# Patient Record
Sex: Female | Born: 1935
Health system: Southern US, Community
[De-identification: ages and names within clinical notes are randomized; demographics above are authoritative.]

## PROBLEM LIST (undated history)

## (undated) DIAGNOSIS — K449 Diaphragmatic hernia without obstruction or gangrene: Secondary | ICD-10-CM

## (undated) DIAGNOSIS — R159 Full incontinence of feces: Secondary | ICD-10-CM

## (undated) DIAGNOSIS — K219 Gastro-esophageal reflux disease without esophagitis: Secondary | ICD-10-CM

## (undated) DIAGNOSIS — K3184 Gastroparesis: Secondary | ICD-10-CM

## (undated) DIAGNOSIS — D739 Disease of spleen, unspecified: Secondary | ICD-10-CM

## (undated) DIAGNOSIS — K635 Polyp of colon: Secondary | ICD-10-CM

## (undated) DIAGNOSIS — A159 Respiratory tuberculosis unspecified: Secondary | ICD-10-CM

## (undated) DIAGNOSIS — Z973 Presence of spectacles and contact lenses: Secondary | ICD-10-CM

## (undated) DIAGNOSIS — I10 Essential (primary) hypertension: Secondary | ICD-10-CM

## (undated) DIAGNOSIS — K589 Irritable bowel syndrome without diarrhea: Secondary | ICD-10-CM

## (undated) DIAGNOSIS — M797 Fibromyalgia: Secondary | ICD-10-CM

## (undated) DIAGNOSIS — H9190 Unspecified hearing loss, unspecified ear: Secondary | ICD-10-CM

## (undated) DIAGNOSIS — K5790 Diverticulosis of intestine, part unspecified, without perforation or abscess without bleeding: Secondary | ICD-10-CM

## (undated) DIAGNOSIS — Z972 Presence of dental prosthetic device (complete) (partial): Secondary | ICD-10-CM

## (undated) DIAGNOSIS — K469 Unspecified abdominal hernia without obstruction or gangrene: Secondary | ICD-10-CM

## (undated) DIAGNOSIS — Z974 Presence of external hearing-aid: Secondary | ICD-10-CM

## (undated) DIAGNOSIS — D7389 Other diseases of spleen: Secondary | ICD-10-CM

## (undated) DIAGNOSIS — Z9889 Other specified postprocedural states: Secondary | ICD-10-CM

## (undated) DIAGNOSIS — K2970 Gastritis, unspecified, without bleeding: Secondary | ICD-10-CM

## (undated) HISTORY — DX: Polyp of colon: K63.5

## (undated) HISTORY — DX: Gastroparesis: K31.84

## (undated) HISTORY — DX: Irritable bowel syndrome, unspecified: K58.9

## (undated) HISTORY — DX: Other specified postprocedural states: Z98.890

## (undated) HISTORY — DX: Disease of spleen, unspecified: D73.9

## (undated) HISTORY — DX: Respiratory tuberculosis unspecified: A15.9

## (undated) HISTORY — DX: Essential (primary) hypertension: I10

## (undated) HISTORY — PX: COLONOSCOPY W/ POLYPECTOMY: SHX1380

## (undated) HISTORY — DX: Diverticulosis of intestine, part unspecified, without perforation or abscess without bleeding: K57.90

## (undated) HISTORY — DX: Unspecified hearing loss, unspecified ear: H91.90

## (undated) HISTORY — DX: Diaphragmatic hernia without obstruction or gangrene: K44.9

## (undated) HISTORY — DX: Unspecified abdominal hernia without obstruction or gangrene: K46.9

## (undated) HISTORY — DX: Gastritis, unspecified, without bleeding: K29.70

## (undated) HISTORY — DX: Other diseases of spleen: D73.89

## (undated) HISTORY — DX: Fibromyalgia: M79.7

## (undated) HISTORY — DX: Gastro-esophageal reflux disease without esophagitis: K21.9

## (undated) HISTORY — PX: X-STOP IMPLANTATION: SHX2677

## (undated) HISTORY — PX: OTHER SURGICAL HISTORY: SHX169

---

## 2004-01-25 ENCOUNTER — Ambulatory Visit (HOSPITAL_COMMUNITY): Admission: RE | Admit: 2004-01-25 | Discharge: 2004-01-25 | Payer: Self-pay | Admitting: Family Medicine

## 2004-07-19 ENCOUNTER — Ambulatory Visit (HOSPITAL_COMMUNITY): Admission: RE | Admit: 2004-07-19 | Discharge: 2004-07-19 | Payer: Self-pay | Admitting: Family Medicine

## 2004-08-28 ENCOUNTER — Ambulatory Visit (HOSPITAL_COMMUNITY): Admission: RE | Admit: 2004-08-28 | Discharge: 2004-08-28 | Payer: Self-pay | Admitting: Ophthalmology

## 2004-09-03 ENCOUNTER — Ambulatory Visit (HOSPITAL_COMMUNITY): Admission: RE | Admit: 2004-09-03 | Discharge: 2004-09-03 | Payer: Self-pay | Admitting: Family Medicine

## 2004-09-11 ENCOUNTER — Ambulatory Visit (HOSPITAL_COMMUNITY): Admission: RE | Admit: 2004-09-11 | Discharge: 2004-09-11 | Payer: Self-pay | Admitting: Ophthalmology

## 2004-10-07 HISTORY — PX: UPPER GASTROINTESTINAL ENDOSCOPY: SHX188

## 2005-01-19 ENCOUNTER — Emergency Department (HOSPITAL_COMMUNITY): Admission: EM | Admit: 2005-01-19 | Discharge: 2005-01-19 | Payer: Self-pay | Admitting: *Deleted

## 2005-06-11 ENCOUNTER — Ambulatory Visit (HOSPITAL_COMMUNITY): Admission: RE | Admit: 2005-06-11 | Discharge: 2005-06-11 | Payer: Self-pay | Admitting: Family Medicine

## 2005-08-12 ENCOUNTER — Ambulatory Visit: Payer: Self-pay | Admitting: Internal Medicine

## 2005-08-22 ENCOUNTER — Ambulatory Visit (HOSPITAL_COMMUNITY): Admission: RE | Admit: 2005-08-22 | Discharge: 2005-08-22 | Payer: Self-pay | Admitting: Family Medicine

## 2005-09-03 ENCOUNTER — Ambulatory Visit (HOSPITAL_COMMUNITY): Admission: RE | Admit: 2005-09-03 | Discharge: 2005-09-03 | Payer: Self-pay | Admitting: Internal Medicine

## 2005-09-03 ENCOUNTER — Ambulatory Visit: Payer: Self-pay | Admitting: Internal Medicine

## 2005-09-03 HISTORY — PX: ESOPHAGOGASTRODUODENOSCOPY: SHX1529

## 2005-10-14 ENCOUNTER — Ambulatory Visit: Payer: Self-pay | Admitting: Internal Medicine

## 2006-06-05 ENCOUNTER — Ambulatory Visit: Payer: Self-pay | Admitting: Internal Medicine

## 2006-07-08 ENCOUNTER — Ambulatory Visit: Payer: Self-pay | Admitting: Gastroenterology

## 2006-07-10 ENCOUNTER — Ambulatory Visit (HOSPITAL_COMMUNITY): Admission: RE | Admit: 2006-07-10 | Discharge: 2006-07-10 | Payer: Self-pay | Admitting: Internal Medicine

## 2006-07-17 ENCOUNTER — Ambulatory Visit (HOSPITAL_COMMUNITY): Admission: RE | Admit: 2006-07-17 | Discharge: 2006-07-17 | Payer: Self-pay | Admitting: Internal Medicine

## 2006-08-07 ENCOUNTER — Ambulatory Visit: Payer: Self-pay | Admitting: Internal Medicine

## 2006-08-11 ENCOUNTER — Ambulatory Visit: Payer: Self-pay | Admitting: Internal Medicine

## 2006-08-11 ENCOUNTER — Ambulatory Visit (HOSPITAL_COMMUNITY): Admission: RE | Admit: 2006-08-11 | Discharge: 2006-08-11 | Payer: Self-pay | Admitting: Internal Medicine

## 2006-08-11 HISTORY — PX: ESOPHAGOGASTRODUODENOSCOPY: SHX1529

## 2006-09-11 ENCOUNTER — Ambulatory Visit: Payer: Self-pay | Admitting: Internal Medicine

## 2006-12-11 ENCOUNTER — Ambulatory Visit (HOSPITAL_COMMUNITY): Admission: RE | Admit: 2006-12-11 | Discharge: 2006-12-11 | Payer: Self-pay | Admitting: Family Medicine

## 2007-03-09 ENCOUNTER — Emergency Department (HOSPITAL_COMMUNITY): Admission: EM | Admit: 2007-03-09 | Discharge: 2007-03-09 | Payer: Self-pay | Admitting: Emergency Medicine

## 2007-05-11 ENCOUNTER — Ambulatory Visit (HOSPITAL_COMMUNITY): Admission: RE | Admit: 2007-05-11 | Discharge: 2007-05-11 | Payer: Self-pay | Admitting: Gastroenterology

## 2007-05-21 ENCOUNTER — Ambulatory Visit: Payer: Self-pay | Admitting: Gastroenterology

## 2007-07-07 ENCOUNTER — Ambulatory Visit: Payer: Self-pay | Admitting: Gastroenterology

## 2007-07-21 ENCOUNTER — Ambulatory Visit (HOSPITAL_COMMUNITY): Payer: Self-pay | Admitting: Oncology

## 2007-07-28 ENCOUNTER — Ambulatory Visit (HOSPITAL_COMMUNITY): Admission: RE | Admit: 2007-07-28 | Discharge: 2007-07-28 | Payer: Self-pay | Admitting: Oncology

## 2007-08-28 ENCOUNTER — Ambulatory Visit: Payer: Self-pay | Admitting: Gastroenterology

## 2007-08-31 ENCOUNTER — Ambulatory Visit (HOSPITAL_COMMUNITY): Admission: RE | Admit: 2007-08-31 | Discharge: 2007-08-31 | Payer: Self-pay | Admitting: Gastroenterology

## 2007-10-08 DIAGNOSIS — K3184 Gastroparesis: Secondary | ICD-10-CM

## 2007-10-08 HISTORY — DX: Gastroparesis: K31.84

## 2007-10-23 ENCOUNTER — Ambulatory Visit: Payer: Self-pay | Admitting: Gastroenterology

## 2007-11-13 ENCOUNTER — Ambulatory Visit (HOSPITAL_COMMUNITY): Admission: RE | Admit: 2007-11-13 | Discharge: 2007-11-13 | Payer: Self-pay | Admitting: Otolaryngology

## 2008-01-04 ENCOUNTER — Encounter (HOSPITAL_COMMUNITY): Admission: RE | Admit: 2008-01-04 | Discharge: 2008-02-03 | Payer: Self-pay | Admitting: Family Medicine

## 2008-01-05 ENCOUNTER — Ambulatory Visit (HOSPITAL_COMMUNITY): Payer: Self-pay | Admitting: Oncology

## 2008-03-15 ENCOUNTER — Ambulatory Visit (HOSPITAL_COMMUNITY): Admission: RE | Admit: 2008-03-15 | Discharge: 2008-03-15 | Payer: Self-pay | Admitting: Obstetrics & Gynecology

## 2008-05-02 ENCOUNTER — Other Ambulatory Visit: Admission: RE | Admit: 2008-05-02 | Discharge: 2008-05-02 | Payer: Self-pay | Admitting: Obstetrics & Gynecology

## 2008-05-18 ENCOUNTER — Ambulatory Visit: Payer: Self-pay | Admitting: Gastroenterology

## 2008-05-25 ENCOUNTER — Ambulatory Visit (HOSPITAL_COMMUNITY): Admission: RE | Admit: 2008-05-25 | Discharge: 2008-05-25 | Payer: Self-pay | Admitting: Gastroenterology

## 2008-05-25 ENCOUNTER — Ambulatory Visit: Payer: Self-pay | Admitting: Gastroenterology

## 2008-05-25 HISTORY — PX: ESOPHAGOGASTRODUODENOSCOPY: SHX1529

## 2008-05-27 ENCOUNTER — Ambulatory Visit: Payer: Self-pay | Admitting: Gastroenterology

## 2008-05-27 HISTORY — PX: GIVENS CAPSULE STUDY: SHX5432

## 2008-06-23 DIAGNOSIS — F341 Dysthymic disorder: Secondary | ICD-10-CM | POA: Insufficient documentation

## 2008-06-23 DIAGNOSIS — K2990 Gastroduodenitis, unspecified, without bleeding: Secondary | ICD-10-CM

## 2008-06-23 DIAGNOSIS — R159 Full incontinence of feces: Secondary | ICD-10-CM | POA: Insufficient documentation

## 2008-06-23 DIAGNOSIS — J301 Allergic rhinitis due to pollen: Secondary | ICD-10-CM | POA: Insufficient documentation

## 2008-06-23 DIAGNOSIS — R112 Nausea with vomiting, unspecified: Secondary | ICD-10-CM | POA: Insufficient documentation

## 2008-06-23 DIAGNOSIS — R141 Gas pain: Secondary | ICD-10-CM | POA: Insufficient documentation

## 2008-06-23 DIAGNOSIS — K449 Diaphragmatic hernia without obstruction or gangrene: Secondary | ICD-10-CM

## 2008-06-23 DIAGNOSIS — D739 Disease of spleen, unspecified: Secondary | ICD-10-CM | POA: Insufficient documentation

## 2008-06-23 DIAGNOSIS — Z8711 Personal history of peptic ulcer disease: Secondary | ICD-10-CM | POA: Insufficient documentation

## 2008-06-23 DIAGNOSIS — D126 Benign neoplasm of colon, unspecified: Secondary | ICD-10-CM | POA: Insufficient documentation

## 2008-06-23 DIAGNOSIS — R143 Flatulence: Secondary | ICD-10-CM

## 2008-06-23 DIAGNOSIS — H9190 Unspecified hearing loss, unspecified ear: Secondary | ICD-10-CM | POA: Insufficient documentation

## 2008-06-23 DIAGNOSIS — K582 Mixed irritable bowel syndrome: Secondary | ICD-10-CM | POA: Insufficient documentation

## 2008-06-23 DIAGNOSIS — K2970 Gastritis, unspecified, without bleeding: Secondary | ICD-10-CM | POA: Insufficient documentation

## 2008-06-23 DIAGNOSIS — R142 Eructation: Secondary | ICD-10-CM

## 2008-06-23 DIAGNOSIS — R1319 Other dysphagia: Secondary | ICD-10-CM | POA: Insufficient documentation

## 2008-06-23 DIAGNOSIS — Z87891 Personal history of nicotine dependence: Secondary | ICD-10-CM | POA: Insufficient documentation

## 2008-06-23 DIAGNOSIS — IMO0002 Reserved for concepts with insufficient information to code with codable children: Secondary | ICD-10-CM | POA: Insufficient documentation

## 2008-06-23 DIAGNOSIS — R195 Other fecal abnormalities: Secondary | ICD-10-CM | POA: Insufficient documentation

## 2008-06-23 DIAGNOSIS — K219 Gastro-esophageal reflux disease without esophagitis: Secondary | ICD-10-CM | POA: Insufficient documentation

## 2008-06-23 DIAGNOSIS — K5730 Diverticulosis of large intestine without perforation or abscess without bleeding: Secondary | ICD-10-CM | POA: Insufficient documentation

## 2008-06-23 DIAGNOSIS — R109 Unspecified abdominal pain: Secondary | ICD-10-CM | POA: Insufficient documentation

## 2008-06-23 DIAGNOSIS — I10 Essential (primary) hypertension: Secondary | ICD-10-CM | POA: Insufficient documentation

## 2008-06-23 DIAGNOSIS — K589 Irritable bowel syndrome without diarrhea: Secondary | ICD-10-CM

## 2008-06-23 DIAGNOSIS — K5909 Other constipation: Secondary | ICD-10-CM | POA: Insufficient documentation

## 2008-06-23 DIAGNOSIS — R197 Diarrhea, unspecified: Secondary | ICD-10-CM | POA: Insufficient documentation

## 2008-06-23 DIAGNOSIS — M479 Spondylosis, unspecified: Secondary | ICD-10-CM | POA: Insufficient documentation

## 2008-06-23 DIAGNOSIS — K2980 Duodenitis without bleeding: Secondary | ICD-10-CM | POA: Insufficient documentation

## 2008-06-23 DIAGNOSIS — L259 Unspecified contact dermatitis, unspecified cause: Secondary | ICD-10-CM | POA: Insufficient documentation

## 2008-06-23 HISTORY — DX: Diaphragmatic hernia without obstruction or gangrene: K44.9

## 2008-08-02 ENCOUNTER — Ambulatory Visit: Payer: Self-pay | Admitting: Gastroenterology

## 2008-08-04 ENCOUNTER — Ambulatory Visit (HOSPITAL_COMMUNITY): Payer: Self-pay | Admitting: Oncology

## 2008-08-11 ENCOUNTER — Encounter (HOSPITAL_COMMUNITY): Admission: RE | Admit: 2008-08-11 | Discharge: 2008-09-10 | Payer: Self-pay | Admitting: Oncology

## 2008-08-15 ENCOUNTER — Encounter (HOSPITAL_COMMUNITY): Admission: RE | Admit: 2008-08-15 | Discharge: 2008-09-14 | Payer: Self-pay | Admitting: Gastroenterology

## 2008-08-30 ENCOUNTER — Ambulatory Visit: Payer: Self-pay | Admitting: Thoracic Surgery

## 2008-09-05 ENCOUNTER — Encounter: Payer: Self-pay | Admitting: Gastroenterology

## 2008-09-05 LAB — CONVERTED CEMR LAB
Hgb A1c MFr Bld: 6 % (ref 4.6–6.1)
TSH: 1.095 microintl units/mL (ref 0.350–4.50)

## 2008-09-20 ENCOUNTER — Ambulatory Visit: Payer: Self-pay | Admitting: Gastroenterology

## 2008-10-03 ENCOUNTER — Inpatient Hospital Stay (HOSPITAL_COMMUNITY): Admission: RE | Admit: 2008-10-03 | Discharge: 2008-10-08 | Payer: Self-pay | Admitting: Thoracic Surgery

## 2008-10-03 ENCOUNTER — Ambulatory Visit: Payer: Self-pay | Admitting: Thoracic Surgery

## 2008-10-03 ENCOUNTER — Encounter: Payer: Self-pay | Admitting: Thoracic Surgery

## 2008-10-12 ENCOUNTER — Encounter: Admission: RE | Admit: 2008-10-12 | Discharge: 2008-10-12 | Payer: Self-pay | Admitting: Thoracic Surgery

## 2008-10-12 ENCOUNTER — Ambulatory Visit: Payer: Self-pay | Admitting: Thoracic Surgery

## 2008-10-25 ENCOUNTER — Encounter: Admission: RE | Admit: 2008-10-25 | Discharge: 2008-10-25 | Payer: Self-pay | Admitting: Thoracic Surgery

## 2008-10-25 ENCOUNTER — Ambulatory Visit: Payer: Self-pay | Admitting: Thoracic Surgery

## 2008-11-30 ENCOUNTER — Ambulatory Visit: Payer: Self-pay | Admitting: Thoracic Surgery

## 2008-11-30 ENCOUNTER — Encounter: Admission: RE | Admit: 2008-11-30 | Discharge: 2008-11-30 | Payer: Self-pay | Admitting: Thoracic Surgery

## 2009-01-25 ENCOUNTER — Encounter: Admission: RE | Admit: 2009-01-25 | Discharge: 2009-01-25 | Payer: Self-pay | Admitting: Thoracic Surgery

## 2009-01-25 ENCOUNTER — Ambulatory Visit: Payer: Self-pay | Admitting: Thoracic Surgery

## 2009-02-27 ENCOUNTER — Encounter (HOSPITAL_COMMUNITY): Admission: RE | Admit: 2009-02-27 | Discharge: 2009-03-29 | Payer: Self-pay | Admitting: Oncology

## 2009-02-27 ENCOUNTER — Ambulatory Visit (HOSPITAL_COMMUNITY): Payer: Self-pay | Admitting: Oncology

## 2009-03-22 ENCOUNTER — Ambulatory Visit (HOSPITAL_COMMUNITY): Admission: RE | Admit: 2009-03-22 | Discharge: 2009-03-22 | Payer: Self-pay | Admitting: Family Medicine

## 2009-04-19 ENCOUNTER — Ambulatory Visit: Payer: Self-pay | Admitting: Gastroenterology

## 2009-04-20 ENCOUNTER — Encounter: Payer: Self-pay | Admitting: Gastroenterology

## 2009-06-01 ENCOUNTER — Other Ambulatory Visit: Admission: RE | Admit: 2009-06-01 | Discharge: 2009-06-01 | Payer: Self-pay | Admitting: Obstetrics & Gynecology

## 2009-11-03 ENCOUNTER — Ambulatory Visit: Payer: Self-pay | Admitting: Gastroenterology

## 2009-11-03 DIAGNOSIS — K3184 Gastroparesis: Secondary | ICD-10-CM | POA: Insufficient documentation

## 2010-01-24 ENCOUNTER — Ambulatory Visit (HOSPITAL_COMMUNITY): Admission: RE | Admit: 2010-01-24 | Discharge: 2010-01-24 | Payer: Self-pay | Admitting: Family Medicine

## 2010-03-27 ENCOUNTER — Ambulatory Visit (HOSPITAL_COMMUNITY): Payer: Self-pay | Admitting: Oncology

## 2010-03-27 ENCOUNTER — Encounter (HOSPITAL_COMMUNITY): Admission: RE | Admit: 2010-03-27 | Discharge: 2010-05-12 | Payer: Self-pay | Admitting: Oncology

## 2010-04-10 ENCOUNTER — Ambulatory Visit (HOSPITAL_COMMUNITY): Admission: RE | Admit: 2010-04-10 | Discharge: 2010-04-10 | Payer: Self-pay | Admitting: Family Medicine

## 2010-04-20 ENCOUNTER — Encounter (INDEPENDENT_AMBULATORY_CARE_PROVIDER_SITE_OTHER): Payer: Self-pay

## 2010-06-20 ENCOUNTER — Ambulatory Visit: Payer: Self-pay | Admitting: Gastroenterology

## 2010-06-26 ENCOUNTER — Observation Stay (HOSPITAL_COMMUNITY): Admission: EM | Admit: 2010-06-26 | Discharge: 2010-06-28 | Payer: Self-pay | Admitting: Emergency Medicine

## 2010-07-11 DIAGNOSIS — K625 Hemorrhage of anus and rectum: Secondary | ICD-10-CM | POA: Insufficient documentation

## 2010-07-27 ENCOUNTER — Encounter: Payer: Self-pay | Admitting: Gastroenterology

## 2010-10-28 ENCOUNTER — Encounter: Payer: Self-pay | Admitting: Thoracic Surgery

## 2010-10-28 ENCOUNTER — Encounter (HOSPITAL_COMMUNITY): Payer: Self-pay | Admitting: Oncology

## 2010-11-06 NOTE — Letter (Signed)
Summary: [facility] REFERRAL  [facility] REFERRAL   Imported By: Ave Filter 07/27/2010 11:35:08  _____________________________________________________________________  External Attachment:    Type:   Image     Comment:   External Document  Appended Document: [facility] REFERRAL Per Cameon at [facility] when she called the pt to get her scheduled for her procedure and pt stated she was no longer having any problems and doesn't want to get this done right now..She may have it done in March 2012 after she comes to f/u in our office first.  Appended Document: [facility] REFERRAL Pts daughter Avie Arenas called and stated that her mother lied and she is having problems. She really needs to get this tcs done but would like to have it done somewhere closer.Marland KitchenMarland KitchenPlease advise?  Please call her back at [phone](Pam)  Appended Document: [facility] REFERRAL Please call daughter. [facility] is the closest place to attempt her second colonoscopy.  Appended Document: [facility] REFERRAL I spoke with pts daughter gave her Dr Darrick Penna recommendations and she stated she would call back to [facility] and work on an appt for her mother.

## 2010-11-06 NOTE — Assessment & Plan Note (Signed)
Summary: fu ov 6 mo,gerd,ibs/ams   Chief Complaint:  6 month follow up- doing good.  History of Present Illness: 77 y/o caucasian female w/ hx GERD/IBS.  Has to wear depends.  3 "good" BMs daily, then some leakage.  Takes bentyl occasionally & this helps.  "lots of gas."  Denies nausea/vomiting.  denies fever/chills. Wt stable.  Feels like she weighs too much.  Passed mucous in stool 2 months ago, thought she saw reddish-brown blood on toilet paper.  GERD well-controlled on omeprazole two times a day. Abnl GES dx gastroparesis 08/2008.    Current Problems (verified): 1)  Gastroparesis  (ICD-536.3) 2)  Unspecified Disease of Spleen  (ICD-289.50) 3)  Hx of Duodenitis  (ICD-535.60) 4)  Hx of Gastritis  (ICD-535.50) 5)  Allergic Rhinitis, Seasonal  (ICD-477.0) 6)  Depression/anxiety  (ICD-300.4) 7)  Osteoarthritis, Spine  (ICD-721.90) 8)  Degenerative Disc Disease  (ICD-722.6) 9)  Decreased Hearing  (ICD-389.9) 10)  Pud, Hx of  (ICD-V12.71) 11)  Hypertension  (ICD-401.9) 12)  Hiatal Hernia  (ICD-553.3) 13)  Fh of Lymphoma  (ICD-202.80) 14)  Other Dysphagia  (ICD-787.29) 15)  Gastroesophageal Reflux Disease, Chronic  (ICD-530.81) 16)  Nausea and Vomiting  (ICD-787.01) 17)  Hx of Diverticulosis of Colon  (ICD-562.10) 18)  Abdominal Pain, Lower  (ICD-789.09) 19)  Hx of Colonic Polyps, Adenomatous  (ICD-211.3) 20)  Hx of Fecal Incontinence  (ICD-787.6) 21)  Constipation, Chronic  (ICD-564.09) 22)  Eczema  (ICD-692.9) 23)  Diarrhea, Bloody  (ICD-787.91) 24)  Tobacco Use, Quit  (ICD-V15.82) 25)  Irritable Bowel Syndrome  (ICD-564.1) 26)  Abdominal Bloating  (ICD-787.3) 27)  Abnormal Feces  (ICD-787.7)  Current Medications (verified): 1)  Caduet 10-20 Mg Tabs (Amlodipine-Atorvastatin) .... Take 1 Tablet By Mouth Once A Day 2)  Klonopin 1 Mg Tabs (Clonazepam) .... Take 1 Tablet By Mouth Once A Day 3)  Visine 0.05 % Soln (Tetrahydrozoline Hcl) .... Drops To Each Eye At Bedtime As  Needed 4)  Omega-3 350 Mg Caps (Omega-3 Fatty Acids) .... Take 1 Capsule By Mouth Once A Day 5)  Tums 500 Mg Chew (Calcium Carbonate Antacid) .... As Needed 6)  Omeprazole 20 Mg Cpdr (Omeprazole) .... Two Times A Day 7)  Nabumentone 750 .... As Needed 8)  Fluoxetine Hcl 20 Mg Caps (Fluoxetine Hcl) .... Once Daily 9)  Multivitamins  Tabs (Multiple Vitamin) .... Once Daily 10)  Systane Ultra 0.4-0.3 % Soln (Polyethyl Glycol-Propyl Glycol) .... As Needed 11)  Bentyl 10 Mg Caps (Dicyclomine Hcl) .... One By Mouth As Needed Bid For Diarrhea  Allergies (verified): No Known Drug Allergies  Past History:  Past Medical History: 1. Gastroesophageal reflux disease controlled with 20 mg b.i.d. 2. Difficulty hearing. 3. Splenic hamartoma. 4. Malignant polyp in 1994 with incomplete colonoscopy in 2006. 5. Hypertension. 6. History of irregular heartbeat. 7. Bladder irritation. 8. Depression with anxious mood. 9. Irritable bowel syndrome. 10.Hiatal hernia. 11.Diverticulosis. 12.Gastritis. 13.Eczema. 14.EGD with dilation with a 56-French Elease Hashimoto in November 2006 for     dysphagia.   Vital Signs:  Patient profile:   77 year old female Height:      68 inches Weight:      146 pounds BMI:     22.28 Temp:     97.7 degrees F oral Pulse rate:   74 / minute BP sitting:   112 / 78  (left arm) Cuff size:   regular  Vitals Entered By: Hendricks Limes LPN (November 03, 2009 11:40 AM)  Physical Exam  General:  Well developed, well nourished, no acute distress. Head:  Normocephalic and atraumatic. Eyes:  Sclera clear, no icterus. Ears:  HOH Mouth:  No deformity or lesions, dentition normal. Neck:  Supple; no masses or thyromegaly. Heart:  Regular rate and rhythm; no murmurs, rubs,  or bruits. Abdomen:  Soft, nontender and nondistended. No masses, hepatosplenomegaly or hernias noted. Normal bowel sounds.without guarding and without rebound.   Msk:  Symmetrical with no gross deformities. Normal  posture. Pulses:  Normal pulses noted. Extremities:  No clubbing, cyanosis, edema or deformities noted. Neurologic:  Alert and  oriented x4;  grossly normal neurologically. Skin:  Intact without significant lesions or rashes. Cervical Nodes:  No significant cervical adenopathy. Psych:  Alert and cooperative. Normal mood and affect.  Impression & Recommendations:  Problem # 1:  GASTROESOPHAGEAL REFLUX DISEASE, CHRONIC (ICD-530.81)  Well-controlled on two times a day Omeprazole  Orders: Est. Patient Level III (46962)  Problem # 2:  Hx of FECAL INCONTINENCE (ICD-787.6) Stable  Problem # 3:  IRRITABLE BOWEL SYNDROME (ICD-564.1)  Stable, bentyl helps.  Some bloating.    Orders: Est. Patient Level III (95284)  Patient Instructions: 1)  Continue bentyl up to two times a day as needed 2)  Begin Sustanex probiotics daily 3)  Office visit 6 months Dr Darrick Penna 4)  Call sooner if any problems Prescriptions: BENTYL 10 MG CAPS (DICYCLOMINE HCL) one by mouth as needed BID for diarrhea  #180 x 3   Entered and Authorized by:   Joselyn Arrow FNP-BC   Signed by:   Joselyn Arrow FNP-BC on 11/03/2009   Method used:   Print then Give to Patient   RxID:   [id number]

## 2010-11-06 NOTE — Letter (Signed)
Summary: Recall Office Visit  [facility]  [address]   [place], [place] [address]   Phone: [phone]  Fax: [phone]      April 20, 2010   [facility] [address] [place], [place]  [address] [birth year]   Dear Ms. [facility],   According to our records, it is time for you to schedule a follow-up office visit with [place].   At your convenience, please call [phone] to schedule an office visit. If you have any questions, concerns, or feel that this letter is in error, we would appreciate your call.   Sincerely,    Hendricks Limes LPN  [facility] Ph: [phone]   Fax: [phone]  Appended Document: Recall Office Visit LAST VISIT WITH DR. NEIJSTROM JULY 2011, Splenic lesion stable.

## 2010-11-06 NOTE — Assessment & Plan Note (Signed)
Summary: IBS, GERD   Visit Type:  Follow-up Visit Primary Care Provider:  Janna Arch, M.D.  Chief Complaint:  F/U Gerd/ IBS.  History of Present Illness: Her bowels: not a lot of trouble. Ever once in a while 1x/q2 mos: soft but not diarrhea she does not have strength to push it out and it comes out a little bit at a time. No fiber supplement and likes corn but not peas. May pass mucous and it's brownish, "red". LAST TCS INCOMPLETE DUE TORTUOUS COLON. Bms: every day. Heartburn controlled-70% of the time. Flares triggered by something she has eaten and usu. sweets and spicy foods.  Getting steroid shots in her back. Plan to repeat q6-12 mos. Has trouble swallowing large pills. Needs to cut up meats. Food not getting hung up.  Current Medications (verified): 1)  Caduet 10-20 Mg Tabs (Amlodipine-Atorvastatin) .... Take 1 Tablet By Mouth Once A Day 2)  Klonopin 1 Mg Tabs (Clonazepam) .... Take 1 Tablet By Mouth Once A Day 3)  Visine 0.05 % Soln (Tetrahydrozoline Hcl) .... Drops To Each Eye At Bedtime As Needed 4)  Omega-3 350 Mg Caps (Omega-3 Fatty Acids) .... Take 1 Capsule By Mouth Once A Day 5)  Tums 500 Mg Chew (Calcium Carbonate Antacid) .... As Needed 6)  Multivitamins  Tabs (Multiple Vitamin) .... Once Daily 7)  Bentyl 10 Mg Caps (Dicyclomine Hcl) .... One By Mouth As Needed Bid For Diarrhea 8)  Effexor Xr 75 Mg Xr24h-Cap (Venlafaxine Hcl) .... Take 1 Tablet By Mouth Once A Day  Allergies (verified): No Known Drug Allergies  Past History:  Past Medical History: Last updated: 11/03/2009 1. Gastroesophageal reflux disease controlled with 20 mg b.i.d. 2. Difficulty hearing. 3. Splenic hamartoma. 4. Malignant polyp in 1994 with incomplete colonoscopy in 2006. 5. Hypertension. 6. History of irregular heartbeat. 7. Bladder irritation. 8. Depression with anxious mood. 9. Irritable bowel syndrome. 10.Hiatal hernia. 11.Diverticulosis. 12.Gastritis. 13.Eczema. 14.EGD with dilation  with a 56-French Elease Hashimoto in November 2006 for     dysphagia.   Vital Signs:  Patient profile:   77 year old female Height:      68 inches Weight:      140 pounds BMI:     21.36 Temp:     97.8 degrees F oral Pulse rate:   80 / minute BP sitting:   110 / 70  (left arm) Cuff size:   regular  Vitals Entered By: Hendricks Limes LPN (June 20, 2010 2:24 PM)  Impression & Recommendations:  Problem # 1:  IRRITABLE BOWEL SYNDROME (ICD-564.1) Assessment Improved Add Benefiber to bulk up stools. Continue Bentyl. OPV in 6 mos.   Problem # 2:  GASTROESOPHAGEAL REFLUX DISEASE, CHRONIC (ICD-530.81) Assessment: Unchanged Controlled fairly well.  CC: PCP  Patient Instructions: 1)  ADD BENEFIBER 2 TSP DAILY. 2)  CONTINUE BENTYL. 3)  FOLLOW UP IN 6 MOS. 4)  The medication list was reviewed and reconciled.  All changed / newly prescribed medications were explained.  A complete medication list was provided to the patient / caregiver.  Appended Document: IBS, GERD ADD RECTAL BLEEDING AS A DIAGNOSIS. NEED TCS AT [facility] W/ OVERTUBE AND FLUROSCOPY W/I NEXT 2-3 MOS. Pt has wedding and vacation planned in OCT 2011.  Appended Document: IBS, GERD Reminder placed in IDX to set pt up in 08/2010.  Appended Document: IBS, GERD 6 MONTH F/U OPV IS IN THE COMPUTER  Appended Document: Orders Update    Clinical Lists Changes  Problems: Added new problem of RECTAL BLEEDING (  ICD-569.3) Orders: Added new Service order of Est. Patient Level III (16109) - Signed

## 2010-12-20 LAB — DIFFERENTIAL
Basophils Relative: 1 % (ref 0–1)
Eosinophils Absolute: 0.1 10*3/uL (ref 0.0–0.7)
Eosinophils Relative: 1 % (ref 0–5)
Lymphs Abs: 1.9 10*3/uL (ref 0.7–4.0)
Monocytes Absolute: 0.3 10*3/uL (ref 0.1–1.0)
Monocytes Relative: 5 % (ref 3–12)
Neutrophils Relative %: 58 % (ref 43–77)

## 2010-12-20 LAB — CARDIAC PANEL(CRET KIN+CKTOT+MB+TROPI)
CK, MB: 0.5 ng/mL (ref 0.3–4.0)
CK, MB: 0.7 ng/mL (ref 0.3–4.0)
Relative Index: INVALID (ref 0.0–2.5)
Total CK: 27 U/L (ref 7–177)
Troponin I: <0.01 ng/mL (ref 0.00–0.06)
Troponin I: <0.01 ng/mL (ref 0.00–0.06)

## 2010-12-20 LAB — CBC
HCT: 36.8 % (ref 36.0–46.0)
Hemoglobin: 12.2 g/dL (ref 12.0–15.0)
MCH: 26.9 pg (ref 26.0–34.0)
MCHC: 33.1 g/dL (ref 30.0–36.0)
MCV: 81.1 fL (ref 78.0–100.0)
RBC: 4.53 MIL/uL (ref 3.87–5.11)

## 2010-12-20 LAB — BASIC METABOLIC PANEL
CO2: 26 mEq/L (ref 19–32)
Calcium: 9.2 mg/dL (ref 8.4–10.5)
Chloride: 108 mEq/L (ref 96–112)
Glucose, Bld: 125 mg/dL — ABNORMAL HIGH (ref 70–99)
Sodium: 140 mEq/L (ref 135–145)

## 2010-12-20 LAB — POCT CARDIAC MARKERS
Myoglobin, poc: 43.1 ng/mL (ref 12–200)
Troponin i, poc: <0.05 ng/mL (ref 0.00–0.09)

## 2010-12-23 LAB — COMPREHENSIVE METABOLIC PANEL
Albumin: 3.9 g/dL (ref 3.5–5.2)
Alkaline Phosphatase: 79 U/L (ref 39–117)
BUN: 10 mg/dL (ref 6–23)
Creatinine, Ser: 0.8 mg/dL (ref 0.4–1.2)
Glucose, Bld: 97 mg/dL (ref 70–99)
Potassium: 3.9 mEq/L (ref 3.5–5.1)
Total Bilirubin: 0.5 mg/dL (ref 0.3–1.2)
Total Protein: 6.4 g/dL (ref 6.0–8.3)

## 2010-12-23 LAB — CBC
HCT: 34.8 % — ABNORMAL LOW (ref 36.0–46.0)
Hemoglobin: 11.4 g/dL — ABNORMAL LOW (ref 12.0–15.0)
MCV: 78.6 fL (ref 78.0–100.0)
Platelets: 234 10*3/uL (ref 150–400)
RDW: 16.7 % — ABNORMAL HIGH (ref 11.5–15.5)

## 2011-01-15 LAB — BASIC METABOLIC PANEL
CO2: 30 mEq/L (ref 19–32)
Calcium: 9.1 mg/dL (ref 8.4–10.5)
GFR calc Af Amer: >60 mL/min (ref >60)
GFR calc non Af Amer: >60 mL/min (ref >60)
Potassium: 4.2 mEq/L (ref 3.5–5.1)
Sodium: 138 mEq/L (ref 135–145)

## 2011-01-15 LAB — CBC
HCT: 32.7 % — ABNORMAL LOW (ref 36.0–46.0)
Hemoglobin: 11 g/dL — ABNORMAL LOW (ref 12.0–15.0)
RBC: 4.3 MIL/uL (ref 3.87–5.11)

## 2011-01-31 ENCOUNTER — Ambulatory Visit (INDEPENDENT_AMBULATORY_CARE_PROVIDER_SITE_OTHER): Payer: Medicare Other | Admitting: Gastroenterology

## 2011-01-31 ENCOUNTER — Encounter: Payer: Self-pay | Admitting: Gastroenterology

## 2011-01-31 VITALS — BP 131/67 | HR 63 | Temp 98.1°F | Ht 68.0 in | Wt 144.6 lb

## 2011-01-31 DIAGNOSIS — K625 Hemorrhage of anus and rectum: Secondary | ICD-10-CM

## 2011-01-31 NOTE — Progress Notes (Signed)
  Subjective:    Patient ID: Whitney Galloway, female    DOB: Sep 02, 1929, 77 y.o.   MRN: [id number]  PCP: Janna Arch, M.D.   HPI  Still having bleeding. Pt hesitant to have procedure because she doesn't want to have surgery. Thinks she has cancer in her bowels. Hard for her bowels to move.   Past Medical History  Diagnosis Date  . GERD (gastroesophageal reflux disease)   . HTN (hypertension)   . IBS (irritable bowel syndrome)   . Hernia of unspecified site of abdominal cavity without mention of obstruction or gangrene   . Hiatal hernia   . Diverticulosis   . Gastritis   . Splenic lesion HAMARTOMA  . Hard of hearing   . Colon polyp 1994 Malignant polyp    2005 NL BE; 2006 incomplete TCS DUE TO REDUNDNACY   Past Surgical History  Procedure Date  . Upper gastrointestinal endoscopy 2006   Review of Systems SEP 2011 140    Objective:   Physical Exam  Vitals reviewed. Constitutional: She is oriented to person, place, and time. She appears well-developed and well-nourished.  HENT:  Head: Normocephalic and atraumatic.  Eyes: Pupils are equal, round, and reactive to light.  Neck: Normal range of motion. Neck supple.  Cardiovascular: Normal rate and regular rhythm.   Pulmonary/Chest: Effort normal and breath sounds normal.  Abdominal: Soft. Bowel sounds are normal. She exhibits no distension. There is no tenderness. There is no rebound and no guarding.  Musculoskeletal: She exhibits no edema.  Neurological: She is alert and oriented to person, place, and time.  Skin: Skin is warm and dry.          Assessment & Plan:

## 2011-02-06 ENCOUNTER — Encounter: Payer: Self-pay | Admitting: Gastroenterology

## 2011-02-06 NOTE — Assessment & Plan Note (Signed)
Differential diagnosis inlcudes colorectal polyp, or cancer and less likely internal hemorrhoids. Pt cancelled TCS in OCT 2011. Spoke with pt and daughter for 20 minutes outlining why the TCS is important. Pt request to be RSC'd ASAP. Needs to be done at [facility] with fluro and OT. Follow up in 6 mos.

## 2011-02-06 NOTE — Progress Notes (Signed)
Cc to PCP 

## 2011-02-07 NOTE — Progress Notes (Signed)
Pt is aware of her OV on 10/18 @ 2 pm with SF

## 2011-02-13 ENCOUNTER — Telehealth: Payer: Self-pay | Admitting: Gastroenterology

## 2011-02-13 NOTE — Telephone Encounter (Signed)
Received call from [facility], they stated Whitney Galloway does not want to have TCS- she told them she is not having any issues- Whitney Galloway called her daughter who verified this-

## 2011-02-19 NOTE — Assessment & Plan Note (Signed)
OFFICE VISIT   Whitney Galloway, Whitney Galloway  DOB:  09/30/1929                                        November 30, 2008  CHART #:  [id number]   The patient comes to the office today.  She is still having some pain on  abducting her shoulder, but otherwise her incisions are well healed.  Chest x-ray showed continued improvement.  Her blood pressure is 106/50,  pulse 62, respirations 18, and sats were 97%.  I will plan to see her  back again in 2 months with a chest x-ray.   [name], M.D.  Electronically Signed   DPB/MEDQ  D:  11/30/2008  T:  12/01/2008  Job:  [id number]

## 2011-02-19 NOTE — Letter (Signed)
October 12, 2008   Ladona Horns. Neijstrom, MD  [address]  [place], [place] [address]   Re:  Whitney Galloway, Whitney Galloway             DOB:  1929-03-28   Dear Minerva Areola:   I saw the patient back today.  We did a wedge resection of her right  upper lobe lesion, which turned out to be a granulomatous process and  which was sent off for second evaluation and they thought that this was  probably some type of infectious process.  The stains were negative, but  the cultures were still pending.  Her chest x-ray today showed improved  air space opacity where she had the wedge, and she is having some  moderate amount of pain.  Overall feels  much better.  I will plan to  see her and I removed her chest tube sutures.  Her blood pressure was  100/52, pulse 68, respirations 18, and saturations were 90%.  I will see  her back in 2 weeks with another chest x-ray.   [name], M.D.  Electronically Signed   DPB/MEDQ  D:  10/12/2008  T:  10/12/2008  Job:  [id number]

## 2011-02-19 NOTE — Assessment & Plan Note (Signed)
NAME:  Whitney Galloway, Whitney Galloway              CHART#:  [id number]   DATE:  08/02/2008                       DOB:  19-Jul-1929   CASE ID:  Babs Sciara, Case ID: [id number], [id number], [place] [address], phone #[phone].   PROBLEM LIST:  1. Gastroesophageal reflux disease, uncontrolled on omeprazole 20 mg      b.i.d.  2. Difficulty hearing.  3. Splenic hamartoma.  4. Malignant polyp in 1994, normal barium enema in 2005 and incomplete      colonoscopy in 2006.  5. Hypertension.  6. History of irregular heartbeat.  7. Bladder irritation.  8. Depression with anxious mood.  9. Irritable bowel syndrome.  10.Gastroesophageal reflux disease.  11.Hiatal hernia.  12.Diverticulosis.  13.History of gastritis.  14.Eczema.  15.EGD with dilation with the 56-French Elease Hashimoto in November 2006 for      dysphagia.   SUBJECTIVE:  The patient is a 77 year old female who was last seen in  August 2009.  She was complaining of dysphagia and uncontrolled  gastroesophageal esophageal reflux disease.  The upper endoscopy was  performed with Bravo capsule placement and showed no evidence of  ulceration or stricture in the esophagus.  She had a Bravo capsule  placed.  The Bravo capsule showed adequate acid suppression with  Kapidex.  She took the Kapidex until August 2009 when [facility] [place] says that she need to try omeprazole 20 mg  b.i.d. before receiving the Kapidex.   She has a list of complaints, which includes burping after eating,  spitting up sometimes, getting strangled, nausea, and she also noted  that she was eating more frequent vegetables. After taking omeprazole  she began to burps and have regurgitation 3-4 times a week.  She did  stop taking the naproxen.  Her swallowing is okay.  At nighttime because  of regurgitation, she is feeling strangled.  Kapidex worked much better  than the omeprazole.  She denies any indigestion.  Rarely, she  have any  heartburn.  She stopped taking the Bentyl because it made her stools  soft and more difficult to control.  She tolerates hard stool much  better.  She does drink caffeinated coffee.   MEDICATIONS:  1. Caduet.  2. Celexa.  3. Klonopin.  4. Hydroxyzine.  5. AndroGel.  6. Oracea.  7. Visine.  8. Omega 3.  9. Tums as needed.  10.Omeprazole 20 mg twice a day.   OBJECTIVE/PHYSICAL EXAM:  VITAL SIGNS:  Weight 148 pounds (up 2 pounds  since August 2009), height 5 feet 8 inches, temperature 97.6, blood  pressure 100/60, and pulse 76.GENERAL:  She is in no apparent distress.  Alert and oriented x4.HEENT:  Atraumatic and normocephalic.  Pupils  equal and react to light.  Mouth, no oral lesions.  Posterior pharynx  without erythema or exudate.  LUNGS:  Clear to auscultation bilaterally.CARDIOVASCULAR:  Regular  rhythm.  No murmur.  Normal S1 and S2.  ABDOMEN:  Bowel sounds are present, soft, nontender, and nondistended.  No rebound or guarding.EXTREMITIES:  No cyanosis or edema.NEUROLOGIC:  She has no focal neurologic deficits.   ASSESSMENT:  The patient is a 77 year old female who had a Bravo capsule  placed in August 2009.  The Bravo capsule shows she had 28 ounces of  reflux on the first day and  8 episodes of reflux on the second day.  Her  DeMeester score on the first day was 45.1 and her DeMeester score on the  second day was 3.8.  She has adequate acid suppression when taking her  Kapidex.  Her symptoms have now worsened since being on omeprazole twice  a day.  Thank you for allowing me to see the patient in consultation.  My recommendations follow.   RECOMMENDATIONS:  1. Would not prescribe Prevacid, omeprazole, or Zegerid for the      patient.  They have been tried in the past and has failed to      control her symptoms.  Recommend she be restarted on the Kapidex 1      p.o. daily.  2. We will also check a gastric emptying study to evaluate for delayed      gastric  emptying as a reason for uncontrolled regurgitation.  3. She is asked to avoid caffeinated beverages.  She is given the [facility] handout on lifestyle remedies.  She is also given a handout      on gas bloating and prevention.  I did explain to her that she      should find the fiber that works for her and if it causes bloating      and      extra gas, then she should avoid that particular item.  4. She has a follow up appointment to see me in 2 months.       Kassie Mends, M.D.  Electronically Signed     SM/MEDQ  D:  08/02/2008  T:  08/02/2008  Job:  [id number]   cc:   Melvyn Novas, MD

## 2011-02-19 NOTE — Assessment & Plan Note (Signed)
NAMEMarland Kitchen  DOLCE, SYLVIA              CHART#:  [id number]   DATE:  05/18/2008                       DOB:  Feb 21, 1929   REFERRING PHYSICIAN:  Melvyn Novas, MD   PRIMARY HEMATOLOGIST:  Ladona Horns. Neijstrom, MD   PROBLEM LIST:  1. Splenic lesion, benign on PET scan in October 2008.  2. Malignant polyp in 1994, normal BE in 2005, and an incomplete      colonoscopy in 2006.  3. Hypertension.  4. History of irregular beat.  5. Bladder irritation.  6. Depression and anxious mood.  7. Irritable bowel syndrome.  8. GERD.  9. Hiatal hernia.  10.Diverticulosis.  11.History of gastritis.  12.Eczema.  13.EGD with dilation (56-Fr Elease Hashimoto) in November 2006 for dysphagia.   SUBJECTIVE:  The patient is a 77 year old female who presents as a  return patient visit.  She was last seen in January 2009.  She reports  having stomach problems.  She says food gets stuck five nights a week in  her chest.  Her symptoms have been going on for one month.  She also  reports that when she bends over, the food comes back up every 3-4 days.  She does not sleep well and complains of trouble breathing.  She takes 2-  3 Tums every night for heartburn.  She does not really have any problems  with food getting stuck.  She has bad problem with increased frequency  of small quantity stools, which occasionally causes her to have staining  of her underwear.  She also reports straining to have a bowel movement.   MEDICATIONS:  1. Caduet.  2. Celexa.  3. Klonopin.  4. Prevacid twice a day.  5. Hydroxyzine as needed.  6. Bentyl as needed (she does not use this every day).  7. __________.  8. __________.  9. Visine eye drops.  10.Omega-3 #3 daily.  11.Tums.   OBJECTIVE:   PHYSICAL EXAMINATION:  VITAL SIGNS:  Weight 146 pounds (unchanged since  January 2009), height 5 feet 8 inches, temperature 97.9, blood pressure  118/82, pulse 76.  GENERAL:  She is in no apparent distress, alert and oriented  x4.HEENT:  Atraumatic and normocephalic.  Pupils are equal, round, and reactive to  light.  Mouth, no oral lesions.  Posterior pharynx without erythema or  exudate.NECK:  Full range of motion.  No lymphadenopathy.LUNGS:  Clear  to auscultation bilaterally.  CARDIOVASCULAR:  Regular rhythm.  No murmur.  Normal S1, S2.ABDOMEN:  Bowel sounds are present.  Soft, nontender, and nondistended.  No  rebound or guarding.   ASSESSMENT:  The patient is a 77 year old female who complains of  dysphagia and regurgitation.  She had her last EGD in November 2007 and  had a dilation.  She reports of uncontrolled gastroesophageal reflux  disease on appropriate PPI therapy.  She also complains of having  small  volume frequent stools which is likely secondary to irritable bowel.  Her occasional staining of her underwear when she passes gas is likely  related to weakness of her anal sphincter due to age and prior  childbirth.  She was complaining of these symptoms back in 2006.  The  colonoscopy at that time was completed to the splenic flexure.  She had  extensive diverticulosis of her sigmoid colon, but no other lesions.   Thank you for allowing  me to see the patient in consultation.  My  recommendations follow:   RECOMMENDATIONS:  1. The patient has no evidence of esophageal motility disorder on      barium swallow in November 2008.  She could have a fibrous ring as      a result of uncontrolled gastroesophageal reflux disease, which is      causing an impediment to the flow of food.  The differential      diagnosis includes esophageal cancer.  Her occasional staining of      her underwear with passing gas and accidentally passing stool is      likely secondary to anal sphincter weakness.  I explained to her      that she should bulk up her stools to have less problems with that      issue.  2. She is given some samples of Kapidex and asked to try them for her      heartburn and indigestion.  If her  symptoms are not significantly      improved on Kapidex, then we will place her on Bravo capsule on the      day she comes for an EGD with dilation.  3. Will schedule her for an EGD with dilation possible Bravo capsule      placement in next week.  4. Follow up appointment in 2 months.       Kassie Mends, M.D.  Electronically Signed     SM/MEDQ  D:  05/18/2008  T:  05/19/2008  Job:  [id number]   cc:   Melvyn Novas, MD

## 2011-02-19 NOTE — Letter (Signed)
August 30, 2008   Ladona Horns. Mariel Sleet, MD  [address]  [place], [place] [address]   Re:  TEMPIE, GIBEAULT             DOB:  [birth year]   Dear Minerva Areola,   I appreciate the opportunity of seeing the patient.  This 77 year old  patient who apparently has decreased hearing and was found to have a  right upper lobe nodule in the anterior segment of the right upper lobe  approximately 2 years ago and a PET scan showed very low uptake,  however, since then she has had increasing density and size of this  right upper lobe lesion and referred here for evaluation.  She is a  nonsmoker.  She has no hemoptysis, fever, chills, or excessive sputum.  Past medical history is significant for she had a right breast  lumpectomy in 1971 for benign disease.  She had a benign hamartoma of  the spleen.  She has a malignant polyp removed in 1994.  She had a  lesion in her mouth that was removed by Dr. Manson Passey.  She has a history of  hypertension as well as SVT.  She also has had cystitis.  She has had  history of irritable bowel syndrome and depression.  She has had  bilateral cataract surgery with implants and has a GERD.   MEDICATIONS:  Caduet 2 mg in the morning, Celexa 20 mg daily, Klonopin 1  mg daily, hydralazine 25 mg p.r.n., Bentyl 10 mg at noon, Centrum, omega-  3, omeprazole 40 mg daily.   ALLERGIES:  She has no allergies.   FAMILY HISTORY:  Noncontributory.   SOCIAL HISTORY:  She is a widow and has 2 children, does not smoke, and  quit smoking in 2005.   REVIEW OF SYSTEMS:  Vital Signs:  She is 145 pounds.  She is 5 feet 8  inches.  General:  She has had some weight gain.  Cardiac:  She gets  some shortness of breath with exertion.  Pulmonary:  See history of  present illness.  GI:  She has reflux, dysphagia, abdominal pain, and  constipation.  See past medical history.  GU:  No kidney disease,  dysuria, or frequent urination.  Vascular:  No claudication, DVT, or  TIAs.  Neurological:   She had dizziness, headaches, and arthritis.  Musculoskeletal:  She has got arthritis and joint pain.  She has been  treated psychiatric for depression and nervousness.  Eye/ENT:  No change  in her eyesight or hearing.  Hematological:  No problems with bleeding,  clotting disorders, or anemia.   PHYSICAL EXAMINATION:  VITAL SIGNS:  Her blood pressure was 126/62,  pulse 72, respirations 18, and sats were 96%.  Her weight is 145 pounds.  She is 5 feet 8 inches.  Her pulmonary function test and FVC of 2.49  with an FEV-1 of 1.60.  HEAD, EYES, EARS, NOSE, AND THROAT:  Unremarkable.  Status post  bilateral cataract surgeries.  NECK:  Supple without thyromegaly.  There is no supraclavicular or  axillary adenopathy.  CHEST:  Clear to auscultation and percussion.  HEART:  Regular sinus rhythm.  No murmurs.  ABDOMEN:  Soft.  There is no hepatosplenomegaly.  EXTREMITIES:  Pulses are 2+.  There is no clubbing or edema.  NEUROLOGICAL:  She is oriented x3.  Sensory and motor intact.  Cranial  nerves intact.   I think the patient probably has a slow-growing bronchoalveolar-type  cancer given increased density  and another possibility of a  granulomatous process.  I feel that wedge resection of this would be the  best possible treatment using the VATs approach.  We will tentatively  set this up for October 03, 2008, at [facility].  I have explained  all the risk of the procedure to her and she and her family agreed.   Sincerely.   [name], M.D.  Electronically Signed   DPB/MEDQ  D:  08/30/2008  T:  08/30/2008  Job:  [id number]   cc:   Melvyn Novas, MD

## 2011-02-19 NOTE — Letter (Signed)
January 25, 2009   Melvyn Novas, MD  [address]  [place], [place] [address]   Re:  Whitney Galloway, Whitney Galloway             DOB:  02-28-1929   Dear Dr. Janna Arch:   I saw the patient back today.  Her blood pressure was 130/68, pulse 71,  respirations 18, and sats were 96%.  Lungs are clear to auscultation and  percussion.  Heart, regular sinus rhythm, no murmurs.  Abdomen is soft.  Her chest x-ray showed no evidence of recurrence of the granulomatous  disease, which we biopsied.  Her incisions are well healed.  Overall,  she is doing well, and I will refer back to you for longterm followup.  I will be happy to see her again if she develops any further pulmonary  problems.   Sincerely,   Whitney Galloway, M.D.  Electronically Signed   DPB/MEDQ  D:  01/25/2009  T:  01/25/2009  Job:  [id number]
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
## 2011-02-19 NOTE — Assessment & Plan Note (Signed)
OFFICE VISIT   Whitney Galloway, Whitney Galloway  DOB:  Apr 05, 1929                                        October 25, 2008  CHART #:  [id number]   The patient came back today complaining of weakness and concerned with  trouble with eating.  Her chest x-ray shows normal postoperative changes  with actual improvement in her aeration.  CBC, her hematocrit was up to  33 from 30 and her white count was normal and her CMET was normal.  We  told her to stop her Lopressor.  Her blood pressure was 100/70, pulse  90, and respirations 18.  We told her to stop her Lopressor as well as  her Ultram and she also take Tylenol.  If she continues to have problems  as far as her stomach, we told her to see her GI doctor in [place].  Overall, she is making slow, but steady progress and I will plan to see  her back again in 3 weeks with a chest x-ray.   [name], M.D.  Electronically Signed   DPB/MEDQ  D:  10/25/2008  T:  10/26/2008  Job:  [id number]

## 2011-02-19 NOTE — Op Note (Signed)
NAMESHIRLETTE, SCARBER             ACCOUNT NO.:  [id number]   MEDICAL RECORD NO.:  [id number]          PATIENT TYPE:  INP   LOCATION:  2314                         FACILITY:  [facility]   PHYSICIAN:  [name], M.D. DATE OF BIRTH:  [birth year]   DATE OF PROCEDURE:  DATE OF DISCHARGE:                               OPERATIVE REPORT   PREOPERATIVE DIAGNOSIS:  Right upper lobe enlarging lesion.   POSTOPERATIVE DIAGNOSIS:  Inflammatory mass, right upper lobe.   OPERATION PERFORMED:  Right video-assisted thoracic surgery wedge of  right upper lobe lesion with no sampling.   SURGEON:  [name], MD.   ANESTHESIA:  General anesthesia.   PROCEDURE:  After percutaneous insertion of all monitor lines, the  patient underwent general anesthesia, was prepped and draped in the  usual sterile manner, and turned to the right lateral thoracotomy  position.  Two trocar sites were made, one in the anterior axillary line  at the sixth intercostal space and one at the posterior axillary line at  the seventh intercostal space.  The trocars were inserted.  A 0-degree  scope was inserted, the lesion in the anterior segment of the right  upper lobe.  Anterior thoracotomy of 6-7 cm was made  with the serratus  being split.  The lesion was palpated in the anterior segment.  We then  used the Covidien staplers to first partially staple the minor fissure  with one application of the 60 blue Covidien stapler and then we  resected the lesion with the 3 applications of the Covidien green  stapler 260 mm and 145 mm.  The lesion was opened and there was some pus  in the lesions.  We thought this to  probably be infectious.  We  cultured this and sent it for frozen section.  We then looked anteriorly  utilizing a 10R and then 4R node.  Chest tube was brought into the  anterior trocar sites and tied in place with 0 silk.  A single On-Q was  inserted in the usual fashion through the posterior trocar site.   The  posterior trocar site was closed with 2-0 Vicryl and Dermabond for the  skin.  The chest was closed with drilling 2 pericostals through the  sixth rib and passed around the fifth rib, #1 Vicryl in the serratus and  3-0 Vicryl in the subcutaneous tissue, and Dermabond for the skin.  The  patient was returned to the recovery room in stable condition.      [name], M.D.  Electronically Signed     DPB/MEDQ  D:  10/03/2008  T:  10/04/2008  Job:  [id number]

## 2011-02-19 NOTE — H&P (Signed)
Whitney Galloway, Whitney Galloway             ACCOUNT NO.:  [id number]   MEDICAL RECORD NO.:  [id number]          PATIENT TYPE:  INP   LOCATION:  NA                           FACILITY:  [facility]   PHYSICIAN:  [name], M.D. DATE OF BIRTH:  April 24, 1929   DATE OF ADMISSION:  DATE OF DISCHARGE:                              HISTORY & PHYSICAL   CHIEF COMPLAINT:  Right upper lobe lung nodule.   This 77 year old patient has a history of decreased hearing, was found  to have a right upper lobe nodule in the anterior segment approximately  2 years ago.  PET scan showed very low uptake, however.  Since then, she  had increased density in size of the right upper lobe lesion.  She is a  nonsmoker.  She had a previous breast biopsy in 1971 for benign disease.  She has had no hemoptysis, fever, chills, or excessive sputum.  The  density of lesion is increased in size.  This could possibly be a  bronchoalveolar cancer or possibly an enlarging inflammatory process.  She is admitted for possible wedge resection of the lesion.  Her  pulmonary function tests show an FVC of 2.49 with an FEV1 of 1.6, both  of which are 77% of predicted.   MEDICATIONS:  1. Caduet one a day.  2. Celexa 20 mg day.  3. Klonopin 1 mg a day.  4. Apresoline 25 mg p.r.n.  5. Bentyl 10 mg at noon.  6. Vitamins.  7. Omeprazole 40 mg daily.   ALLERGIES:  She has no allergies.   PAST MEDICAL HISTORY:  Significant for benign hamartoma of the spleen,  malignant polyp was removed from the colon in 1994.  She had a lesion in  her mouth, was removed by Dr. Manson Passey.  She has hypertension and history  of SVT.  She also had cystitis, history of irritable bowel syndrome, and  depression.  She had bilateral cataract surgery with implants and also  has GERD.   SOCIAL HISTORY:  She is a widow, has 2 children, quit smoking in 2005.   REVIEW OF SYSTEMS:  She is 145 pounds.  She is 5 feet and 8.  GENERAL:  She had some recent weight gain.   CARDIAC:  No angina or atrial  fibrillation with intermittent SVT.  She had some shortness of breath  with exertion.  PULMONARY:  See history of present illness.  GI:  No  nausea.  She has reflux dysphagia, abdominal pain, and constipation, see  past medical history.  GU:  No kidney disease, dysuria, or frequent  urination.  VASCULAR:  No claudication, DVT, or TIAs.  NEUROLOGIC:  She  has dizziness and headaches.  MUSCULOSKELETAL:  She has arthritis and  joint pain.  PSYCHIATRIC:  She has been treated for depression and  nervousness.  EYE/ENT:  No change in her eyesight or hearing.  HEMATOLOGIC:  No problems with bleeding, clotting disorders, or anemia.   PHYSICAL EXAMINATION:  VITAL SIGNS:  Her blood pressure is 126/62, pulse  72, respirations 18, sats 96%, and weight is 145 pounds.  She is 5 feet  and 8.  HEAD:  Atraumatic.  EYES:  Pupils equal and reactive to light and accommodation.  Extraocular movements are normal.  EARS:  Tympanic membranes are intact.  NOSE:  There is no septal deviation.  THROAT:  Without lesions.  Uvula is a midline.  NECK:  Supple without thyromegaly.  There is no supraclavicular or  axillary adenopathy.  No jugular venous distention.  No carotid bruits.  CHEST:  Clear to auscultation and percussion.  HEART:  Regular sinus rhythm.  No murmurs.  ABDOMEN:  Soft.  There is no hepatosplenomegaly.  EXTREMITIES:  Pulses are 2+.  There is no clubbing or edema.  NEUROLOGIC:  She is oriented x3.  Sensory and motor intact.  Cranial  nerves are intact.   IMPRESSION:  1. Right upper lobe enlarging lesion.  2. Irritable bowel syndrome.  3. Hypertension.  4. Hypercholesterolemia.  5. History of malignant polyp removal.  6. Bilateral cataract surgery.  7. History of cystitis.  8. History of right breast biopsy.   PLAN:  Right VATS, wedge right upper lobe lesion, possible right upper  lobectomy.      [name], M.D.  Electronically Signed      DPB/MEDQ  D:  09/28/2008  T:  09/28/2008  Job:  [id number]

## 2011-02-19 NOTE — Assessment & Plan Note (Signed)
NAME:  Whitney Galloway, Whitney Galloway              CHART#:  [id number]   DATE:  09/20/2008                       DOB:  Feb 22, 1929   REFERRING Floetta Brickey:  Melvyn Novas, MD.   PRIMARY HEMATOLOGIST:  Ladona Horns. Neijstrom, MD.   PROBLEM LIST:  1. Gastroesophageal reflux disease controlled with 20 mg b.i.d.  2. Difficulty hearing.  3. Splenic hamartoma.  4. Malignant polyp in 1994 with incomplete colonoscopy in 2006.  5. Hypertension.  6. History of irregular heartbeat.  7. Bladder irritation.  8. Depression with anxious mood.  9. Irritable bowel syndrome.  10.Hiatal hernia.  11.Diverticulosis.  12.Gastritis.  13.Eczema.  14.EGD with dilation with a 56-French Elease Hashimoto in November 2006 for      dysphagia.   SUBJECTIVE:  The patient is a 77 year old female who presents as a  return patient visit.  She has been recently diagnosed with right lung  cancer and will have surgery on 10/03/2008.  Heartburn is doing well on  Prilosec twice a day.  Her insurance company would not give her Kapidex.  She has changed the way she eats and is eating her heaviest meal during  the middle of the day.  She states this works for a gastroparesis.  She  has been following the gastroparesis recommendations.  Oral contrast  gave her diarrhea.  Her stool was yellow and it was brown.   MEDICATIONS:  1. Caduet.  2. Klonopin.  3. Prevacid.  4. Hydroxyzine.  5. Clindagel.  6. Oracea.  7. Visine.  8. Omega 3.  9. Tums.  10.Omeprazole 20 mg b.i.d.   OBJECTIVE:  VITAL SIGNS:  Weight 147 pounds (unchanged since October  2009), height 5 feet 8 inches, temperature 97.5, blood pressure 112/72,  and pulse 60.GENERAL:  She is in no apparent distress.  Alert and  oriented x4.HEENT:  Atraumatic, normocephalic.  Pupils equal and react  to light.  Mouth, no oral lesions.  Posterior pharynx without erythema  or exudate.LUNGS:  Clear to auscultation bilaterally.CARDIOVASCULAR:  Regular rhythm.  No murmur.  Normal S1 and  S2.ABDOMEN:  Bowel sounds are  present, soft, nontender, nondistended.  No rebound or guarding.   LABS:  Normal TSH, hemoglobin A1c, and cortisol.   ASSESSMENT:  The patient is a 77 year old female with gastroesophageal  reflux disease, which is fairly well controlled.  Thank you for allowing  me to see the patient in consultation.  My recommendations follow.   RECOMMENDATIONS:  1. She should continue the omeprazole.  2. She should continue to follow her gastroparesis diet. 3.  Follow up      appointment in 6 months.       Kassie Mends, M.D.  Electronically Signed     SM/MEDQ  D:  09/20/2008  T:  09/21/2008  Job:  [id number]   cc:   Ladona Horns. Mariel Sleet, MD  Melvyn Novas, MD

## 2011-02-19 NOTE — Assessment & Plan Note (Signed)
NAMEMarland Kitchen  Whitney Galloway, Whitney Galloway              CHART#:  [id number]   DATE:  07/07/2007                       DOB:  Nov 04, 1928   REFERRING PHYSICIAN:  Melvyn Novas, M.D.   PROBLEM LIST:  1. 3.8 x 4.3 cm lobular enlargement in the central midportion of the      spleen.  MRI of the abdomen in October 2006 suggests a splenic      hemartoma.  2. Gastritis.  3. Anxiety.  4. GERD.  5. Hiatal hernia.  6. Diverticulosis.  7. Eczema.   SUBJECTIVE:  The patient is a 77 year old female who presents as a  return patient visit.  She states that she spoke to Dr. Janna Arch and he  did not definitively recommend surgery.  She presents today asking what  my opinion is.  When I saw her in August of 2008, I recommended that she  have a PET scan done and see Ladona Horns. Neijstrom, M.D.   MEDICATIONS:  1. Naproxen 500 mg b.i.d.  2. Caduet 20 mg daily.  3. Detrol 4 mg daily.  4. Celexa 20 mg daily.  5. Klonopin 1 mg daily.  6. Prevacid 30 mg b.i.d.  7. Hydroxyzine.  8. Halobetasol 50 mg as needed.  9. Bentyl 10 mg as needed.   OBJECTIVE:  VITAL SIGNS:  Weight 142 pounds, height 5 feet 8 inches,  temperature 97.9, blood pressure 110/54, pulse 64.  GENERAL:  She is in no acute distress.  LUNGS:  Clear to auscultation bilaterally.  CARDIOVASCULAR:  Regular rhythm, no murmur.  Normal S1 and S2.  ABDOMEN:  Bowel sounds are present.  Soft, nontender, and nondistended.  No rebound or guarding.   ASSESSMENT:  The patient is a 77 year old female with a splenic lesion  and it is unclear whether or not it is a benign or hypermetabolic  process.  The differential diagnosis includes a hemartoma.  Thank you  for allowing me to see this patient in consultation.   RECOMMENDATIONS:  1. She will be given a referral to hematology/oncology, Dr. Mariel Sleet,      for further discussion of the management of her splenic lesion.  I      will await his recommendations before recommending splenectomy.  I      believe  he is more capable of addressing this issue.  I explained      this to the patient.  I did explain to her that it is better to      have a spleen than not have a spleen, but if she has a      hypermetabolic process, then a splenectomy should be performed.  2. She has a follow-up appointment to see me in 6-8 weeks.       Kassie Mends, M.D.  Electronically Signed     SM/MEDQ  D:  07/07/2007  T:  07/08/2007  Job:  [id number]   cc:   Ladona Horns. Mariel Sleet, MD  Melvyn Novas, MD

## 2011-02-19 NOTE — Assessment & Plan Note (Signed)
NAMEMarland Kitchen  TENNILE, STYLES              CHART#:  [id number]   DATE:  10/23/2007                       DOB:  1929/03/02   REFERRING PHYSICIAN:  Melvyn Novas, M.D.   PRIMARY HEMATOLOGIST:  Ladona Horns. Mariel Sleet, M.D.   PROBLEM LIST:  1. Splenic lesion likely benign on PET scan in October 2008.  2. Malignant polyp in 1994, nl BE 2005 and an incomplete colonoscopy      in 2006  3. Hypertension.  4. History of irregular heart beat.  5. Bladder irritation.  6. Depression with anxious mood.  7. Irritable bowel syndrome.  8. Gastroesophageal reflux disease.  9. Hiatal hernia.  10.Diverticulosis.  11.History of gastritis.  12.Eczema.  13.EGD with dilation(31F Elease Hashimoto) 08/2005 for dysphagia   SUBJECTIVE:  Ms. Fickel is a 77 year old female who presents as a  return patient visit.  She occasionally complains of shortness of breath  and left chest pain.  Her swallowing is okay.  Peanut butter has trouble  going down.  Her bowels are moving okay, so she stopped the MiraLax and  the fiber.  The fiber caused her to feel bloated.  She prefers to have a  firmer stool because she has more control.  She was having difficulty  pushing stool out.  She did have two vaginal births.  Her barium swallow  in November 2008 was normal except for a small hiatal hernia.   OBJECTIVE:  VITAL SIGNS:  Weight 146 pounds (up 5 pounds since August  2008), height 5 feet 8 inches, temperature 97.6, blood pressure 124/60,  pulse 64.GENERAL:  She is in no apparent distress, alert and oriented  x4.  LUNGS:  Clear to auscultation bilaterally.CARDIOVASCULAR:  Regular  rhythm.ABDOMEN:  Bowel sounds are present.  Soft, nontender,  nondistended.   ASSESSMENT:  Ms. Heard is a 77 year old female whose problem  swallowing has now resolved.  Her gastroesophageal reflux disease is  controlled on Prevacid once or twice a day.  She is only using the  Bentyl as needed.   Thank you for allowing me to see Ms. Eich in  consultation.  My  recommendations follow.   RECOMMENDATIONS:  1. She may follow up to see me in 6 months.  2. She should continue the Prevacid and the Bentyl as needed.       Kassie Mends, M.D.  Electronically Signed     SM/MEDQ  D:  10/23/2007  T:  10/23/2007  Job:  [id number]   cc:   Melvyn Novas, MD

## 2011-02-19 NOTE — Assessment & Plan Note (Signed)
NAMEMarland Kitchen  Galloway, Whitney              CHART#:  [id number]   DATE:  05/21/2007                       DOB:  [birth year]   REFERRING PHYSICIAN:  Melvyn Novas, MD   PROBLEM LIST:  1. A 3.8 x 4.3-cm lobular enlargement in the ventral mid portion of      the spleen.  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  2. Gastritis.  3. Anxiety.  4. Gastroesophageal reflux disease.  5. Hiatal hernia.  6. Diverticulosis.  7. Eczema.   SUBJECTIVE:  Whitney Galloway is a 77 year old female who is a return patient  visit, but a new patient visit to me.  She complains of nausea, which  she attributes to her vertigo.  She also was taking Naproxen and cut  back on that, which improved her nausea.  She takes Naprosyn for  osteoporosis and disk disease.  Her abdominal cramping is managed with  Bentyl.  She has intermittent diarrhea and constipation.  She returns  today to discuss the CT scan that was performed on May 11, 2007.  It  shows a hypervascular mass in the spleen which was minimally increased  since October of 2007.  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  The  lesion has increased in size and the recommendation from the radiologist  is PET CT to determine if the lesion represents a hypermetabolic tumor  or suggests a benign process.  Whitney Galloway said that Dr. Karilyn Galloway told her  that if changed at all that her spleen should come out.   MEDICATIONS:  1. Naproxen 500 mg twice daily as needed.  2. Caduet.  3. Detrol 4 mg daily.  4. Celexa 20 mg daily.  5. Klonopin 1 mg daily.  6. Prevacid 30 mg twice daily.  7. Hydroxyzine as needed.  8. Halobetasol cream as needed.  9. Bentyl 10 mg as needed.   OBJECTIVE:  Weight 141 pounds (unchanged since December of 2007), height  5 foot 8, temperature 97.3, blood pressure 118/70, pulse 68.  GENERAL:  She is in no apparent distress, alert and oriented x4.  HEENT:  Atraumatic, normocephalic, pupils are equal and  react to light.  Mouth -  no oral lesions.  Posterior pharynx without erythema or exudate.NECK:  Has full range of motion.  No lymphadenopathy.LUNGS:  Are clear to  auscultation bilaterally.CARDIOVASCULAR:  Regular rhythm with no murmur,  normal S1 and S2.ABDOMEN:  Bowel sounds are present, soft, nontender,  nondistended, no rebound or guarding.EXTREMITIES:  Without clubbing,  cyanosis or edema.NEURO:  She has no focal neurologic deficits.   ASSESSMENT:  Whitney Galloway is a 77 year old female who presents with a  splenic lesion which may be a hamartoma.  It has failed surveillance  criteria because it increased in size.  The suggestion was a PET CT scan  to further evaluate the lesion.  Whitney Galloway is not interested in  traveling to [facility] to have this study done.  She also has nausea  likely related to NSAID gastritis. Her last endoscopy was in November  2007, which showed antral erythema.   Thank you for allowing me to see Whitney Galloway in consultation.  My  recommendations follow.   RECOMMENDATIONS:  1. Whitney Galloway would like to discuss subsequent management of her      splenic  lesion with Dr.  Janna Arch.  The options that were presented      to her are PET CT in [place] or splenectomy.  I would also like      to get the input from Dr. Mariel Sleet.  I would hate to take her      spleen out at this age unless it was absolutely necessary.  2. She is also asked to discontinue the use of Naproxen and try      Tylenol Arthritis for her pain, because I believe the Naproxen is      causing NSAID gastritis leadins to nausea.  3. She has a follow up appointment to see me in one month and I will      fax the CT scan results to Dr. Janna Arch.       Kassie Mends, M.D.  Electronically Signed     SM/MEDQ  D:  05/21/2007  T:  05/22/2007  Job:  [id number]   cc:   Melvyn Novas, MD

## 2011-02-19 NOTE — Op Note (Signed)
Whitney Galloway, Whitney Galloway             ACCOUNT NO.:  [id number]   MEDICAL RECORD NO.:  [id number]          PATIENT TYPE:  AMB   LOCATION:  DAY                           FACILITY:  [facility]   PHYSICIAN:  Kassie Mends, M.D.      DATE OF BIRTH:  April 06, 1929   DATE OF PROCEDURE:  05/25/2008  DATE OF DISCHARGE:                               OPERATIVE REPORT   REFERRING PHYSICIAN:  Melvyn Novas, MD   PROCEDURE:  Esophagogastroduodenoscopy with Bravo capsule placement.   INDICATION FOR EXAM:  Whitney Galloway is a 77 year old female who complains  of uncontrolled gastroesophageal reflux disease.  She also complains of  regurgitation.  She states that her symptoms have been worse for the  last month and have been uncontrolled on Prevacid twice a day.  She is  given a Capadex to try for her symptoms and she said her symptoms did  not improve.  She had a barium swallow in November 2008 which showed  normal esophageal motility.   FINDINGS:  1. Normal esophagus without evidence of Barrett mass, erosion,      ulceration, or stricture.  The GE junction was noted to be at 41 cm      from the teeth.  The Bravo capsule was placed 35 cm from the teeth.  2. Normal stomach, duodenal bulb, and second portion of the duodenum.   DIAGNOSIS:  No source for Whitney Galloway's symptoms identified.  The Bravo  study will be continued for 48 hours.  The differential diagnosis  includes nonulcer dyspepsia.   RECOMMENDATIONS:  1. She should take Kapidex once daily or Prevacid twice daily while      the study is occurring.  2. She is to return Bravo on May 27, 2008.  3. She may resume her previous diet.   MEDICATIONS:  1. Demerol 100 mg IV.  2. Versed 6 mg IV.  3. Phenergan 6.25 mg IV.   PROCEDURE TECHNIQUE:  Physical exam was performed.  Informed consent was  obtained from the patient after explaining the benefits, risks, and  alternatives to the procedure.  The patient was connected to the monitor  and  placed in the left lateral position.  Continuous oxygen was provided  by nasal cannula.  IV medicine administered through an indwelling  cannula.  After administration of sedation, the patient's esophagoscope  was intubated and the scope was advanced under direct visualization to  the second portion of the duodenum.  The scope was removed slowly by  carefully examining the color, texture, anatomy, and integrity of the  mucosa on the way out after identifying the GE junction at 41 cm.  The  Bravo capsule was introduced into the esophagus at 35 cm from the teeth.  Suction was applied for 1 minute.  The capsule was released.  The  patient's esophagus was again intubated with a  diagnostic gastroscope and the scope was advanced to confirm that the  Bravo capsule placement on the sidewall of the esophagus.  The scope was  removed.  The patient was recovered in endoscopy and discharged home in  satisfactory condition.  Kassie Mends, M.D.  Electronically Signed     SM/MEDQ  D:  05/25/2008  T:  05/25/2008  Job:  [id number]   cc:   Melvyn Novas, MD  Fax: [phone]

## 2011-02-22 NOTE — H&P (Signed)
Whitney Galloway, Whitney Galloway             ACCOUNT NO.:  [id number]   MEDICAL RECORD NO.:  [id number]          PATIENT TYPE:  AMB   LOCATION:  DAY                           FACILITY:  [facility]   PHYSICIAN:  Lionel December, M.D.    DATE OF BIRTH:  [birth year]   DATE OF ADMISSION:  08/07/2006  DATE OF DISCHARGE:  LH                                HISTORY & PHYSICAL   CHIEF COMPLAINT:  Followup for abdominal pain.  The patient also complains  of dysphagia.   HISTORY OF PRESENT ILLNESS:  Whitney Galloway is a 77 year old, Caucasian female  patient of Dr. Janna Arch who was evaluated 1 month ago for left-sided  abdominal pain when nd she also had an episode of nausea, vomiting and self-  limiting bloody diarrhea.  She had abdominopelvic CT on July 10, 2006.  She had anterior splenic mass which measured 42 x 38 mm, but no evidence of  adenopathy.  By the time we saw her, her diarrhea had resolved.  She then  had MR on July 17, 2006.  Once again, an area measuring 43 x 38 mL of  lobular enlargement was noted in anterior ventral portion of spleen.  Signal  characteristics and enhancement pattern was virtually identical to the rest  of the splenic tissue and was felt to be splenic hematoma.  No other  abnormalities were noted in reference to retroperitoneum or colon.   As far as symptoms are concerned, she has occasional left lower quadrant  abdominal pain associated with bowel urgency, but she denies rectal  bleeding.  She is complaining of dysphagia.  This is primarily with solids.  She feels food gets lodged in her mid sternal level and she has nausea and  eventually passes down.  She denies heartburn, but she belches every time  she eats a meal.  She has noted some discomfort in epigastric area and does  not have good appetite, but she has not lost any weight.  She had an EGD in  November 2006, revealing small sliding hiatal hernia and her esophagus was  dilated to 54-French.  She had gastritis and  duodenitis, but her H. pylori  serology was negative.   MEDICATIONS:  1. Naproxen 250 mg b.i.d.  2. Caduet 520 daily.  3. Detrol LA 4 mg daily.  4. Clonazepam 1 mg nightly.  5. Clarinex 10 mg daily p.r.n. x6 a day primarily for calcium.  6. Celexa 20 mg daily.  7. Prevacid 30 mg b.i.d.  8. Metamucil 4-6 tablets a day.  9. Hydroxyzine 25-50 mg nightly p.r.n.  10.Spiriva inhaler every morning.   PAST MEDICAL HISTORY:  1. Chronic GERD.  2. Hypertension.  3. Depression.  4. DJD of lumbar spine.  5. History of colonic polyps.  6. Adenoma removed in 1990s.  7. Colonoscopy in November 2006, which was incomplete.  She had extensive      sigmoid diverticulosis.  Since she had a barium enema in November 2005,      we opted not to repeat it.  8. Multiple cysts removed from her left breast.  9. Left wrist surgery for fracture  years ago.  10.Remote history of peptic ulcer disease in 1950s.   ALLERGIES:  No known drug allergies.   FAMILY HISTORY:  Negative for colorectal carcinoma.   SOCIAL HISTORY:  She is a widow.  She has two children.  One of her  daughters lives in [place].  She smoked cigarettes x50 years, but quit 2  years ago.  She does not drink alcohol.   PHYSICAL EXAMINATION:  GENERAL:  A pleasant, well-developed, thin, Caucasian  female who is in no acute distress.  VITAL SIGNS;  She weighs 138 pounds.  She is 5 feet 8 inches tall.  Pulse 62  per minute, blood pressure 128/62, temperature is 98.1.  HEENT:  Conjunctivae is pink.  Sclerae is nonicteric.  Oropharyngeal mucosa  is normal.  She has a complete upper plate.  She has a few remaining teeth  in lower jaw.  She is in the process of getting her partial fixed.  NECK:  No neck masses are noted.  CARDIAC:  Cardiac exam with regular rhythm.  Normal S1-S3.  No murmur or  gallop noted.  LUNGS:  Lungs are clear to auscultation.  ABDOMEN:  Her abdomen is symmetrical, bowel sounds are normal.  Palpation  reveals soft  abdomen with mild midepigastric tenderness.  Spleen is not  palpable.  Liver edges indistinct below RCM.  It does not appear to be  enlarged.  She has palpable sigmoid colon.  No clubbing or peripheral edema  noted.   LABORATORY DATA AND X-RAY FINDINGS:  MR abdomen findings as above.   ASSESSMENT:  1. Splenic lesion appears to be hematoma.  Nevertheless, this would be      followed up with CT in 3 months to make sure it is not progressive.  2. Left lower quadrant abdominal pain with bowel urgency appears to be      secondary to irritable bowel syndrome.  She may have had an self-      limiting bout of colitis in September when she had acute      symptomatology.  Should this recur, she will get in touch with [place]      immediately.  3. Dysphagia.  She also has some dyspepsia.  She is on Naproxen and      therefore could have peptic ulcer disease.  Her heartburn at least      appears to be well-controlled with double dose PPI.  She did not do      well with a single dose PPI.   PLAN:  1. She will have abdominal CT in 3 months.  2. Diagnostic esophagogastroduodenoscopy and possible esophageal dilation      to be performed at [facility] in the future.      Lionel December, M.D.  Electronically Signed     NR/MEDQ  D:  08/07/2006  T:  08/07/2006  Job:  [id number]   cc:   Melvyn Novas, MD  Fax: [phone]   Day Surgery at [facility]

## 2011-02-22 NOTE — Op Note (Signed)
NAMESCHELLY, CHUBA             ACCOUNT NO.:  [id number]   MEDICAL RECORD NO.:  [id number]          PATIENT TYPE:  AMB   LOCATION:  DAY                           FACILITY:  [facility]   PHYSICIAN:  Lionel December, M.D.    DATE OF BIRTH:  1929/02/19   DATE OF PROCEDURE:  09/03/2005  DATE OF DISCHARGE:                                 OPERATIVE REPORT   PROCEDURE:  Esophagogastroduodenoscopy with esophageal dilation followed by  colonoscopy which was incomplete.   INDICATION:  Whitney Galloway is a 77 year old Caucasian female with chronic GERD  whose symptoms are poorly controlled with anti-reflux measures and a single  dose PPI. She is also complaining of solid food dysphagia. She has history  of colonic polyps. When seen recently in our office, stool was heme  positive. She is undergoing both EGD and colonoscopy. There is no history of  frank melena or rectal bleeding. Both the procedure risks were reviewed with  the patient, and informed consent was obtained.   PREMEDICATION:  Cetacaine spray for pharyngeal topical anesthesia, Demerol  50 mg IV, Versed 6 mg IV in divided dose.   FINDINGS:  Procedure performed in endoscopy suite. The patient's vital signs  and O2 saturation were monitored during the procedure and remained stable.   PROCEDURE #1:  Esophagogastroduodenoscopy. The patient was placed in left  lateral position, and Olympus videoscope was passed oropharynx without any  difficulty into esophagus.   Esophagus. Mucosa of the esophagus was normal. No ring or stricture was  noted. GE junction was at 40 cm from the incisors and hiatus was at 42.   Stomach. It was empty and distended very well with insufflation. Folds of  proximal stomach were normal. Examination mucosa revealed patchy erythema at  antrum but no erosions or ulcers were noted. Pyloric channel was patent.  Angularis, fundus and cardia were examined by retroflexing the scope were  normal.   Duodenum. Focal bulbar  erythema noted, but once again no erosions or ulcers  noted. Similarly, second part of the duodenum revealed normal mucosa and  folds. Endoscope was withdrawn.   Esophagus was dilated by passing 54-French Maloney dilator to full  insertion. As the dilator was withdrawn, endoscope was passed again, and the  esophagus reexamined, and there was no mucosal disruption. Endoscope was  withdrawn, and the patient prepared for procedure #2.   PROCEDURE #2:  Colonoscopy. Rectal examination performed. Rectal tone was  somewhat diminished. Olympus videoscope was placed in rectum and advanced  under vision into sigmoid colon. She had numerous diverticula. Tortuous  sigmoid colon. Difficulty encountered in passing the scope across the  junction of descending and sigmoid colon. This was finally possible by  turning the patient on the right side. As the patient was repositioned in  supine position, scope was passed further into splenic flexure but tended to  fall back in the sigmoid colon. Therefore, exam was incomplete. Few  diverticula were noted at descending with several diverticula at sigmoid  colon, but there were no polyps and/or tumor masses. Rectal mucosa was  normal. Scope was retroflexed to examine anorectal junction which was  unremarkable. Endoscope was withdrawn. The patient tolerated the procedures  well.   FINAL DIAGNOSIS:  Small sliding hiatal hernia without evidence of ring or  stricture formation. Esophagus dilated by passing 54-French Maloney dilator  given history of dysphagia.   Nonerosive antral gastritis and bulbar duodenitis.   Incomplete colonoscopy limited to splenic flexure, extensive diverticulosis  of the sigmoid colon.   RECOMMENDATIONS:  1.  She will continue anti-reflux measures. Will switch her to Zegerid at 40      mg p.o. q.a.m. She can continue use of Tums on p.r.n. basis.  2.  If possible, dose of naproxen should be reduced to 500 mg daily or 250      mg  b.i.d., but this would be left to Dr. Janna Arch.  3.  She needs to be back on high-fiber diet and FiberChoice 2 tablets q.d.  4.  We will check her CBC and H pylori serology today.  5.  Will hold off barium enema or a virtual colonoscopy since she had BE one      year ago.  6.  We will plan to see her back in the office in four to six weeks.      Lionel December, M.D.  Electronically Signed     NR/MEDQ  D:  09/03/2005  T:  09/03/2005  Job:  [id number]   cc:   Melvyn Novas, MD  Fax: [phone]

## 2011-02-22 NOTE — Discharge Summary (Signed)
Whitney Galloway, Whitney Galloway             ACCOUNT NO.:  [id number]   MEDICAL RECORD NO.:  [id number]          PATIENT TYPE:  INP   LOCATION:  3310                         FACILITY:  [facility]   PHYSICIAN:  [name], M.D. DATE OF BIRTH:  [birth year]   DATE OF ADMISSION:  10/03/2008  DATE OF DISCHARGE:  10/08/2008                               DISCHARGE SUMMARY   HISTORY:  The patient is a 77 year old female referred to Dr. Edwyna Shell for  a right upper lobe lung nodule.  The patient was found to have the  nodule in the right upper lobe anterior segment approximately 2 years  ago.  The PET scan revealed very little uptake.  The lesion has grown in  time, however.  She was felt by Dr. Edwyna Shell to require further evaluation  and treatment and was admitted this hospitalization for wedge resection  of this lesion.   His past medical history includes history of benign hamartoma of the  spleen, history of a malignant polyp of the colon in 1994, history of a  lesion in her mouth, removed by Dr. Manson Passey, but this is unspecified in  the history.  Other diagnoses include hypertension, history of  supraventricular tachycardia, history of cystitis, history of irritable  bowel syndrome, history of depression, history of bilateral cataract  surgery, history of gastroesophageal reflux.   Medications prior to admission included the following:  1. Caduet 10 mg daily.  2. Celexa 20 mg q.p.m.  3. Klonopin 1 mg p.r.n.  4. Omeprazole 20 mg b.i.d.  5. Omega three q.a.m.  6. Tums p.r.n.  7. Multivitamin 1 q.a.m.  8. Hydroxyzine 25 mg p.r.n.   ALLERGIES:  No known drug allergies.   Family history, social history, review of symptoms, and physical exam,  please see the history and physical done at the time of admission.   HOSPITAL COURSE:  The patient was admitted electively on October 03, 2008, taken to the operating room where she underwent a right video-  assisted thoracoscopy with a right mini thoracotomy  and right upper lobe  wedge resection and lymph node dissection.  Procedure was performed by  Dr. Edwyna Shell, tolerated well, and she was taken to the post anesthesia  care unit in stable condition.  Postoperative hospital course, the  patient has progressed nicely.  All routine lines, monitors, and  drainage devices were discontinued in the standard fashion.  She had a  mild postoperative acute blood loss anemia.  Hemoglobin/hematocrit dated  October 06, 2008, were 9.8 and 30 respectively.  Pathology revealed the  right upper lobe wedge resection to have granulomatous inflammation.  The lymph nodes were all benign.  Overall, the patient was deemed to be  acceptable for discharge on October 08, 2008.   CONDITION ON DISCHARGE:  Stable, improving.   MEDICATIONS ON DISCHARGE:  As preoperatively.  Additionally, Niferex 150  mg daily, Toprol-XL 25 mg daily, Ultram 50 mg every 6 hours as needed  for pain.   INSTRUCTIONS:  The patient received written instructions regarding  medications, activity, diet, wound care, and followup.  Followup include  Dr. Edwyna Shell in 1 week  post discharge.   FINAL DIAGNOSES:  Benign right upper lobe lesion, now status post wedge  resection.   Other diagnoses include hypertension, depression, gastroesophageal  reflux, history of irritable colon, history of hypercholesterolemia,  history of chronic obstructive pulmonary disease.      Rowe Clack, P.A.-C.      [name], M.D.  Electronically Signed    WEG/MEDQ  D:  11/15/2008  T:  11/16/2008  Job:  [id number]

## 2011-02-22 NOTE — Consult Note (Signed)
NAMEJENNIE, HANNAY NO.:  [id number]   MEDICAL RECORD NO.:  [id number]           PATIENT TYPE:   LOCATION:                                 FACILITY:   PHYSICIAN:  Lionel December, M.D.    DATE OF BIRTH:  Jun 05, 1929   DATE OF CONSULTATION:  08/12/2005  DATE OF DISCHARGE:                                   CONSULTATION   REFERRING PHYSICIAN:  Melvyn Novas, M.D.   REASON FOR ADMISSION:  Fecal incontinence.   HISTORY OF PRESENT ILLNESS:  Ms. Supak is 77 year old, Caucasian female  who reports over the last year she is having problems with fecal  incontinence.  She notes occasional hard stools for which she takes stool  softeners.  She also reports having small amounts of bowel movement at a  time which can make her go to the bathroom around 8 times a day on some  days.  Other days, she feels constipated and only goes every day or two.  She complains of bilateral lower quadrant, dull, aching pain that is  intermittent and lasts a few minutes generally associated around her bowel  movements.  She did notice some bright red blood in her stool with a clot,  but this is back 7 months ago.  This only happened on one occasion.  She has  a history of an adenomatous colon polyp in the 1990s, with the colonoscopy  done in [place].  She has not since returned for repeat colonoscopy.   As a separate issue, she complains of chronic refractory GERD symptoms  including heartburn and indigestion.  She has been on Nexium 40 mg daily for  about 4 months.  This has helped her symptoms about 20%.  She complains of  solid food dysphagia.  She states her food is stuck and points  retrosternally.  She complains of intermittent nausea, denies any vomiting,  denies any odynophagia or early satiety, however, she notes she is afraid  to eat due to her multiple stools.  She denies any early satiety.   PAST MEDICAL HISTORY:  1.  Diverticulosis.  2.  Chronic GERD.  3.  Hiatal  hernia.  4.  Hypertension.  5.  Remote history of peptic ulcer disease in 1954.  6.  Degenerative disc disease and osteoarthritis.  7.  Depression.  8.  Hard of hearing and wears a hearing aide.  9.  Seasonal allergies.  10. History of adenomatous polyp in 1990s with colonoscopy in [place].  11. Five benign left breast cysts removed.  12. Left wrist surgery.   CURRENT MEDICATIONS:  1.  Naproxen 500 mg b.i.d.  2.  Caduat 5/20 mg daily.  3.  Clonazepam 1 mg nightly.  4.  Detrol LA 4 mg daily.  5.  Nexium 40 mg daily.  6.  Clarinex once daily.  7.  Phazyme 2-4 daily.  8.  Tums up to 6 per day.  9.  Over-the-counter generic acid reducers at bedtime.   ALLERGIES:  No known drug allergies.   FAMILY HISTORY:  No known family history of colorectal cancer, liver or  chronic  GI problems.  Mother deceased at age 21 secondary to old age.  Father deceased at age 4 secondary to lymphoma.  She has six siblings all  of whom are relatively healthy.   SOCIAL HISTORY:  Ms. Schommer is a widow.  She lives alone.  Her daughter  lives here in [place].  She has two grown healthy children.  She is a  Futures trader.  She has a 50-pack-year history of tobacco use quitting a year  ago.  Denies any alcohol or drug use.   REVIEW OF SYSTEMS:  CONSTITUTIONAL:  Weight is stable.  She is complaining  of some fatigue and malaise, although she attributes this to her history of  depression.  Denies any fever or chills.  CARDIOPULMONARY:  Denies any chest  pain or palpitations, no shortness of breath, cough, dyspnea or hemoptysis.  GASTROINTESTINAL:  See history of present illness.   PHYSICAL EXAMINATION:  VITAL SIGNS:  Weight 137 pounds, height 68 inches,  temperature 98.1, blood pressure 122/70, pulse 82.  GENERAL:  Ms. Brandi is an elderly, Caucasian female who is alert and  pleasant, cooperative in no acute distress.  HEENT:  Sclerae clear, nonicteric.  Conjunctivae is pink.  Oropharynx pink  and moist  without any lesions.  She does have upper dentures and lower  partial intact.  NECK:  Supple without any masses or thyromegaly.  CHEST:  Heart regular rate and rhythm with normal S1, S2 without murmurs,  rubs or gallops.  LUNGS:  Clear to auscultation bilaterally.  ABDOMEN:  Flat with positive bowel sounds x4.  No bruits auscultated, soft.  Nondistended, nontender without any palpable masses or hepatosplenomegaly.  No rebound tenderness or guarding.  RECTAL:  She does have perirectal erythema without any obvious lesions.  She  has poor sphincter tone.  No internal masses palpated, but there is a large  amount of brown stool in the vault.  A small amount was obtained which was  Hemoccult positive.  EXTREMITIES:  Without edema bilaterally.  SKIN:  Pink, warm and dry without any rash or jaundice.   IMPRESSION:  Ms. Cothran is a 77 year old, Caucasian female with a 77-year-  old history of fecal incontinence as well as small, incomplete bowel  movements throughout the day mixed with constipation.  She was found to have  a Hemoccult positive stool.  On exam, she has a history of adenomatous colon  polyp and she is going to need colonoscopy to rule out colorectal carcinoma.  She could have some underlying constipation issues with diarrhea as well.  She is also having intermittent aching and abdominal pains related to above.   She has refractory gastroesophageal reflux disease symptoms including  heartburn and indigestion only 20% better on Nexium 40 mg daily.  Given new-  onset and severity of these symptoms, I feel she needs her upper  gastrointestinal tract evaluated as well for complicated gastroesophageal  reflux disease and the development of Barrett's esophagus.  She also has  intermittent solid food dysphagia and therefore, she is to be evaluated for  ring or stricture.   RECOMMENDATIONS: 1.  She should continue Nexium 40 mg daily.  Can increase this to b.i.d. on      a p.r.n.  basis.  2.  Will schedule for colonoscopy with possible ED with Dr. Karilyn Cota in the      near future.  I have discussed this procedure including risks and      benefits including, but no limited to bleeding, infection, perforation  and drug reaction.  She agrees with plan and consent will be obtained.  3.  She has history of nausea with GoLytely prep in the past, therefore, I      will give her Phenergan 12.5 mg p.o. to take 30 minutes prior to the      initiation of the prep.  She can take this again in 6 hours if she needs      to.  Although I have warned her of sedation and I have told her that she      absolutely must have somebody with her when she preps for the      colonoscopy and throughout the night to drive her to the hospital in the      morning.   We would like to thank Dr. Janna Arch for allowing [place] to participate in the  care of Ms. Buckingham.      Nicholas Lose, N.P.      Lionel December, M.D.  Electronically Signed    KC/MEDQ  D:  08/12/2005  T:  08/12/2005  Job:  [id number]

## 2011-02-22 NOTE — Op Note (Signed)
Whitney Galloway, Whitney Galloway             ACCOUNT NO.:  [id number]   MEDICAL RECORD NO.:  [id number]          PATIENT TYPE:  AMB   LOCATION:  DAY                           FACILITY:  [facility]   PHYSICIAN:  Lionel December, M.D.    DATE OF BIRTH:  1929/06/15   DATE OF PROCEDURE:  08/11/2006  DATE OF DISCHARGE:                                 OPERATIVE REPORT   PROCEDURE PERFORMED:  Esophagogastroduodenoscopy with esophageal dilation.   INDICATIONS FOR PROCEDURE:  Whitney Galloway is a 77 year old Caucasian female who  has intermittent dysphagia and upper abdominal pain.  She has been  on naproxen therefore suspected  to have peptic ulcer disease.  She has  chronic GERD and has been maintained on a PPI.  She also has had LLQ  abdominal pain and felt to have IBS.  She was recently evaluated with a CT  and an MR for splenic lesion, which is felt to be a hamartoma; however, will  be followed up in December with another CT.  Procedure risks were reviewed  with the patient.  Informed consent was obtained.   MEDS FOR CONSCIOUS SEDATION:  Benzocaine spray for pharyngeal topical  anesthesia.  Demerol 25 mg IV, Versed 4 mg IV.   FINDINGS:  Procedure performed in endoscopy suite.  Patient's vital signs  and O2 sat were monitored during procedure and remain stable.  Patient was  placed left lateral position.  Olympus video scope was passed via oropharynx  without difficulty into esophagus.   Esophagus:  Mucosa of esophagus normal except she had single erosion just  proximal to gastroesophageal junction which was at 39 cm and hiatus was at  41.  No obvious ring or stricture was noted.   Stomach:  It was empty and distended very well with insufflation.  Folds  proximal stomach are normal.  Examination mucosa was normal except for focal  prepyloric erythema but no erosions or ulcers are noted.  Angularis, fundus  and cardia were examined by retroflexing the scope and were normal.   Duodenum:  Bulbar mucosa was  normal.  Scope was passed to second part of  duodenum where mucosa and folds were normal.  Endoscope was withdrawn.   Esophagus was dilated by passing 56 [place] Maloney dilator to full  insertion.  As the dilator was withdrawn, endoscope was passed again and  there was no mucosal disruption noted.  Endoscope was withdrawn.  The  patient tolerated the procedure well.   FINAL DIAGNOSES:  1. Single erosion at distal esophagus and a small sliding hiatal hernia.      No evidence of ring or stricture but esophagus was dilated by passing      56 [place] Maloney dilator.  2. Prepyloric antral erythema but no evidence of erosions or peptic ulcer      disease.   RECOMMENDATIONS:  She will continue antireflux measures, Prevacid 30 mg  twice daily and use naproxen only when absolutely necessary.   Dicyclomine 10 mg twice daily, prescription given for 60 with five refills.   OV in one month.      Lionel December, M.D.  Electronically  Signed     NR/MEDQ  D:  08/11/2006  T:  08/12/2006  Job:  [id number]   cc:   Melvyn Novas, MD  Fax: [phone]

## 2011-02-22 NOTE — Op Note (Signed)
[name], Whitney Galloway             ACCOUNT NO.:  [id number]   MEDICAL RECORD NO.:  [id number]          PATIENT TYPE:  AMB   LOCATION:                                FACILITY:  [facility]   PHYSICIAN:  Kassie Mends, M.D.      DATE OF BIRTH:  1929-07-20   DATE OF PROCEDURE:  05/27/2008  DATE OF DISCHARGE:                               OPERATIVE REPORT   REFERRING PHYSICIAN:  Melvyn Novas, MD   PROCEDURE:  Bravo capsule study, 48 hours.   INDICATIONS FOR EXAM:  Ms. Babich is a 77 year old female who complains  of uncontrolled GERD.  She also complains of regurgitation.  She has  been on Prevacid twice a day.  She said Kapidex did more for her.   FINDINGS:  On day #1, the study period was 23 hours and 53 minutes.  She  had 28 episodes of reflux.  Four episodes lasted greater than 5 minutes.  The longest duration of reflux was 105 minutes.  She spent 154 minutes  at a pH less than 4.  Her DeMeester score on day #1 was 45.1 (normal  less than 14.72).  On day #2, her study period was 23 hours and 12  minutes.  She had 8 episodes of reflux.  She had no episodes that lasted  greater than 5 minutes.  The longest duration of reflux was 5 minutes.  She spent 11 minutes at a pH less than 4.  Her DeMeester score was 3.8.  Her symptom association probability was 99.9% with heartburn, 47.3% with  regurgitation, 21.3% with belch, and 21.3% with nausea.   DIAGNOSIS:  Adequate acid suppression with proton pump inhibitor.   RECOMMENDATIONS:  Continue PPI.  Consider treatment for nonulcer  dyspepsia.      Kassie Mends, M.D.  Electronically Signed     SM/MEDQ  D:  05/27/2008  T:  05/28/2008  Job:  [id number]   cc:   Melvyn Novas, MD  Fax: [phone]

## 2011-04-09 ENCOUNTER — Other Ambulatory Visit (HOSPITAL_COMMUNITY): Payer: Medicare Other

## 2011-04-15 ENCOUNTER — Ambulatory Visit (HOSPITAL_COMMUNITY): Payer: Self-pay | Admitting: Oncology

## 2011-05-03 ENCOUNTER — Encounter: Payer: Self-pay | Admitting: Gastroenterology

## 2011-06-18 ENCOUNTER — Other Ambulatory Visit: Payer: Self-pay | Admitting: Obstetrics & Gynecology

## 2011-06-18 DIAGNOSIS — Z139 Encounter for screening, unspecified: Secondary | ICD-10-CM

## 2011-06-28 ENCOUNTER — Ambulatory Visit (HOSPITAL_COMMUNITY)
Admission: RE | Admit: 2011-06-28 | Discharge: 2011-06-28 | Disposition: A | Discharge status: Home / Self Care | Payer: Medicare Other | Source: Ambulatory Visit | Attending: Obstetrics & Gynecology | Admitting: Obstetrics & Gynecology

## 2011-06-28 DIAGNOSIS — Z1231 Encounter for screening mammogram for malignant neoplasm of breast: Secondary | ICD-10-CM | POA: Insufficient documentation

## 2011-06-28 DIAGNOSIS — Z139 Encounter for screening, unspecified: Secondary | ICD-10-CM

## 2011-07-01 ENCOUNTER — Encounter (HOSPITAL_COMMUNITY): Payer: Self-pay

## 2011-07-02 ENCOUNTER — Encounter (HOSPITAL_COMMUNITY): Payer: Self-pay | Admitting: Oncology

## 2011-07-02 ENCOUNTER — Encounter (HOSPITAL_COMMUNITY): Payer: Medicare Other | Attending: Oncology | Admitting: Oncology

## 2011-07-02 ENCOUNTER — Telehealth (HOSPITAL_COMMUNITY): Payer: Self-pay | Admitting: *Deleted

## 2011-07-02 VITALS — BP 104/64 | HR 56 | Temp 97.3°F | Wt 145.2 lb

## 2011-07-02 DIAGNOSIS — D7389 Other diseases of spleen: Secondary | ICD-10-CM | POA: Insufficient documentation

## 2011-07-02 DIAGNOSIS — F329 Major depressive disorder, single episode, unspecified: Secondary | ICD-10-CM

## 2011-07-02 DIAGNOSIS — D739 Disease of spleen, unspecified: Secondary | ICD-10-CM

## 2011-07-02 DIAGNOSIS — I10 Essential (primary) hypertension: Secondary | ICD-10-CM | POA: Insufficient documentation

## 2011-07-02 DIAGNOSIS — K589 Irritable bowel syndrome without diarrhea: Secondary | ICD-10-CM | POA: Insufficient documentation

## 2011-07-02 LAB — DIFFERENTIAL
Basophils Absolute: 0.1 10*3/uL (ref 0.0–0.1)
Basophils Relative: 1 % (ref 0–1)
Eosinophils Relative: 1 % (ref 0–5)
Lymphocytes Relative: 35 % (ref 12–46)
Monocytes Absolute: 0.3 10*3/uL (ref 0.1–1.0)
Neutro Abs: 3.6 10*3/uL (ref 1.7–7.7)

## 2011-07-02 LAB — CBC
HCT: 37.1 % (ref 36.0–46.0)
MCHC: 32.6 g/dL (ref 30.0–36.0)
Platelets: 236 10*3/uL (ref 150–400)
RDW: 14.2 % (ref 11.5–15.5)
WBC: 6.2 10*3/uL (ref 4.0–10.5)

## 2011-07-02 NOTE — Telephone Encounter (Signed)
Pt's daughter notified that all labs are fine.

## 2011-07-02 NOTE — Progress Notes (Signed)
This office note has been dictated.

## 2011-07-02 NOTE — Telephone Encounter (Signed)
Message copied by Adelene Amas on Tue Jul 02, 2011  4:15 PM ------      Message from: Mariel Sleet, ERIC S      Created: Tue Jul 02, 2011  2:33 PM       Call her dtr.-labs are fine.

## 2011-07-02 NOTE — Patient Instructions (Signed)
[facility]  Discharge Instructions  RECOMMENDATIONS MADE BY THE CONSULTANT AND ANY TEST RESULTS WILL BE SENT TO YOUR REFERRING DOCTOR.   EXAM FINDINGS BY MD TODAY AND SIGNS AND SYMPTOMS TO REPORT TO CLINIC OR PRIMARY MD: will check a CBC with Differential today and if there is anything abnormal we will call you.  MEDICATIONS PRESCRIBED: none      SPECIAL INSTRUCTIONS/FOLLOW-UP: Return to Clinic in 1 year.   I acknowledge that I have been informed and understand all the instructions given to me and received a copy. I do not have any more questions at this time, but understand that I may call the Specialty Clinic at [facility] at [phone] during business hours should I have any further questions or need assistance in obtaining follow-up care.    __________________________________________  _____________  __________ Signature of Patient or Authorized Representative            Date                   Time    __________________________________________ Nurse's Signature

## 2011-07-03 NOTE — Progress Notes (Signed)
CC:   Whitney Galloway, M.D. Delbert Harness, MD  DIAGNOSES: 1. Splenic lesion which is either a hemangioma or hamartoma, stable since     October 2007 and I am not going to schedule her a CT scan in     follow up this year. 2. Degenerative disk and joint disease with spinal stenosis.  She      has told me today that Dr. Danielle Dess does not think he can operate on     her safely. 3. Chronic depression, but that is better.  She got very depressed     after the death of her husband. 4. Irregular heartbeat. 5. Right breast lumpectomy in 1971. 6. Right upper lobe lung lesion that turned out to be a benign     granuloma lesion at the time of resection by Dr. Edwyna Shell.  That was     done in December 2009. 7. Hypertension. 8. IBS. 9. Gastroesophageal reflux disease.  She tells me today that Dr. Darrick Penna also wants her to be seen at [facility] for a colonoscopy consultation since she has a very, tortuous, difficult to examine colon.  She does not really want to go.  She is really concerned about her back discomfort and the fact that she cannot be as mobile as she once was. So, what I have tried to do is just sit down with her and her daughter who was with her today and just talk about and counsel her on what the options are.  I think that Dr. Darrick Penna' interest in her is to make sure she does not have anything occult there.  I was not sure I really got the story of having blood in her stools, but Dr. Darrick Penna according to [name] was unable to really get a good look at her colon.  [name] and her daughter both say that the only time she gets a little relief from her back pain is with the steroid injections and that is definitely very short-lived.  So they are going to meet with Dr. Danielle Dess in the near future.  She really wants that issue resolved first and foremost rather than the colon procedure.  She rests quite a bit.  She does not sleep perfectly well and she does not have as good an appetite she states  either.  The good news is that her weight is essentially the same as it was a year ago.  Her other vital signs are quite good, pulse right around 56 and regular.  Abdomen today is soft, nontender.  I could not feel her liver or spleen and no nodes in her inguinal areas and her abdomen showed normal bowel sounds.  There was no distension, so with that in mind I will not do another CT scan this year. I will just see her next year and see how she is before I make any further decisions about repeating a CT scan.  I suspect I will not since this has always appeared to be a benign lesion realistically.  I told her to call us if she has any issues that she wants to discuss, but I hope that she gets better from the help of Dr. Danielle Dess and she will let me know if we can help her in any way.    ______________________________ Ladona Horns. Mariel Sleet, MD ESN/MEDQ  D:  07/02/2011  T:  07/03/2011  Job:  [id number]

## 2011-07-04 NOTE — Progress Notes (Signed)
NOTED

## 2011-07-08 LAB — DIFFERENTIAL
Basophils Absolute: 0
Lymphocytes Relative: 42
Lymphs Abs: 2
Monocytes Absolute: 0.3
Monocytes Relative: 7
Neutro Abs: 2.3

## 2011-07-08 LAB — CBC
Hemoglobin: 11.1 — ABNORMAL LOW
Platelets: 216
RDW: 15.9 — ABNORMAL HIGH
WBC: 4.7

## 2011-07-08 LAB — COMPREHENSIVE METABOLIC PANEL
Albumin: 3.9
BUN: 11
Calcium: 9.2
Creatinine, Ser: 0.73
GFR calc Af Amer: >60
Total Protein: 6.2

## 2011-07-12 LAB — CBC
HCT: 30.5 % — ABNORMAL LOW (ref 36.0–46.0)
HCT: 31.2 % — ABNORMAL LOW (ref 36.0–46.0)
Hemoglobin: 10.1 g/dL — ABNORMAL LOW (ref 12.0–15.0)
Hemoglobin: 9.8 g/dL — ABNORMAL LOW (ref 12.0–15.0)
MCHC: 32.5 g/dL (ref 30.0–36.0)
MCHC: 32.9 g/dL (ref 30.0–36.0)
MCV: 78.3 fL (ref 78.0–100.0)
Platelets: 167 10*3/uL (ref 150–400)
Platelets: 177 10*3/uL (ref 150–400)
Platelets: 194 10*3/uL (ref 150–400)
RBC: 3.84 MIL/uL — ABNORMAL LOW (ref 3.87–5.11)
RBC: 4.44 MIL/uL (ref 3.87–5.11)
RDW: 16 % — ABNORMAL HIGH (ref 11.5–15.5)
RDW: 16.1 % — ABNORMAL HIGH (ref 11.5–15.5)
WBC: 6 10*3/uL (ref 4.0–10.5)
WBC: 7.9 10*3/uL (ref 4.0–10.5)

## 2011-07-12 LAB — COMPREHENSIVE METABOLIC PANEL
ALT: 15 U/L (ref 0–35)
ALT: 20 U/L (ref 0–35)
AST: 29 U/L (ref 0–37)
AST: 34 U/L (ref 0–37)
Albumin: 2.7 g/dL — ABNORMAL LOW (ref 3.5–5.2)
Alkaline Phosphatase: 65 U/L (ref 39–117)
CO2: 22 mEq/L (ref 19–32)
Calcium: 8.6 mg/dL (ref 8.4–10.5)
Chloride: 108 mEq/L (ref 96–112)
Creatinine, Ser: 0.74 mg/dL (ref 0.4–1.2)
GFR calc Af Amer: >60 mL/min (ref >60)
GFR calc Af Amer: >60 mL/min (ref >60)
GFR calc non Af Amer: >60 mL/min (ref >60)
Potassium: 4.4 mEq/L (ref 3.5–5.1)
Sodium: 138 mEq/L (ref 135–145)
Sodium: 139 mEq/L (ref 135–145)
Total Bilirubin: 0.6 mg/dL (ref 0.3–1.2)
Total Protein: 5.1 g/dL — ABNORMAL LOW (ref 6.0–8.3)

## 2011-07-12 LAB — BASIC METABOLIC PANEL
BUN: 4 mg/dL — ABNORMAL LOW (ref 6–23)
CO2: 25 mEq/L (ref 19–32)
CO2: 28 mEq/L (ref 19–32)
Chloride: 100 mEq/L (ref 96–112)
GFR calc non Af Amer: >60 mL/min (ref >60)
GFR calc non Af Amer: >60 mL/min (ref >60)
Glucose, Bld: 118 mg/dL — ABNORMAL HIGH (ref 70–99)
Glucose, Bld: 123 mg/dL — ABNORMAL HIGH (ref 70–99)
Potassium: 3.7 mEq/L (ref 3.5–5.1)

## 2011-07-12 LAB — BLOOD GAS, ARTERIAL
Acid-Base Excess: 0.4 mmol/L (ref 0.0–2.0)
Bicarbonate: 25.2 mEq/L — ABNORMAL HIGH (ref 20.0–24.0)
Drawn by: 206361
pCO2 arterial: 45.8 mmHg — ABNORMAL HIGH (ref 35.0–45.0)
pO2, Arterial: 43.7 mmHg — ABNORMAL LOW (ref 80.0–100.0)

## 2011-07-12 LAB — ABO/RH: ABO/RH(D): B POS

## 2011-07-12 LAB — WOUND CULTURE: Culture: NO GROWTH

## 2011-07-12 LAB — POCT I-STAT 3, ART BLOOD GAS (G3+)
Acid-Base Excess: 1 mmol/L (ref 0.0–2.0)
Patient temperature: 98
pO2, Arterial: 84 mmHg (ref 80.0–100.0)

## 2011-07-12 LAB — AFB CULTURE WITH SMEAR (NOT AT ARMC): Acid Fast Smear: NONE SEEN

## 2011-07-12 LAB — URINALYSIS, ROUTINE W REFLEX MICROSCOPIC
Bilirubin Urine: NEGATIVE
Glucose, UA: NEGATIVE mg/dL
Hgb urine dipstick: NEGATIVE
Ketones, ur: NEGATIVE mg/dL
pH: 5.5 (ref 5.0–8.0)

## 2011-07-12 LAB — FUNGUS CULTURE W SMEAR

## 2011-07-12 LAB — TYPE AND SCREEN

## 2011-07-24 ENCOUNTER — Ambulatory Visit: Payer: Medicare Other | Admitting: Gastroenterology

## 2011-07-24 ENCOUNTER — Encounter: Payer: Self-pay | Admitting: Gastroenterology

## 2011-07-24 ENCOUNTER — Ambulatory Visit (INDEPENDENT_AMBULATORY_CARE_PROVIDER_SITE_OTHER): Payer: Medicare Other | Admitting: Gastroenterology

## 2011-07-24 VITALS — BP 123/67 | HR 64 | Temp 97.4°F | Ht 68.0 in | Wt 146.8 lb

## 2011-07-24 DIAGNOSIS — K625 Hemorrhage of anus and rectum: Secondary | ICD-10-CM

## 2011-07-24 DIAGNOSIS — K219 Gastro-esophageal reflux disease without esophagitis: Secondary | ICD-10-CM

## 2011-07-24 NOTE — Progress Notes (Signed)
Referring Provider: Isabella Stalling, MD Primary Care Physician:  Isabella Stalling, MD Primary Gastroenterologist: Dr. Darrick Penna   Chief Complaint  Patient presents with  . Follow-up    HPI:   Whitney Galloway returns today in 6 mos f/u for  hx of rectal bleeding. She has not gone to [facility] for a colonoscopy as we had requested (due to torturous colon). She states she would rather not deal with it now due to severe spinal stenosis. She is quite active and in good spirit. No bleeding in 2-3 months. Occasional constipation, takes stool softener. Lower abdominal discomfort with constipation. Reflux, on Prilosec BID. Yesterday had reflux all day long. Ate meatloaf sandwhich and burned like fire. Went to bed hungry last night, trying not to eat before bedtime. Most of the time about the same. Usually at night. Takes 2-3 Tums and ok.   Past Medical History  Diagnosis Date  . GERD (gastroesophageal reflux disease)   . HTN (hypertension)   . IBS (irritable bowel syndrome)   . Hernia of unspecified site of abdominal cavity without mention of obstruction or gangrene   . Hiatal hernia   . Diverticulosis   . Gastritis   . Splenic lesion HAMARTOMA  . Hard of hearing   . Colon polyp 1994 Malignant polyp    2005 NL BE; 2006 incomplete TCS DUE TO REDUNDNACY  . Fibromyalgia   . TB (tuberculosis) AGE 66  . Osteoporosis     Past Surgical History  Procedure Date  . Upper gastrointestinal endoscopy 2006  . Benign tumor removed from bilateral breast   . Colonoscopy w/ polypectomy   . Mass removed from roof of mouth   . Cataract surgery   . Lung surgery for pulmonary nodules   . Left wrist cyst removal   . X-stop implantation     Current Outpatient Prescriptions  Medication Sig Dispense Refill  . amlodipine-atorvastatin (CADUET) 10-20 MG per tablet Take 1 tablet by mouth daily. 20 mg      . calcium carbonate (TUMS - DOSED IN MG ELEMENTAL CALCIUM) 500 MG chewable tablet Chew 1 tablet by mouth as  needed.        . clobetasol (TEMOVATE) 0.05 % cream Apply 1 application topically 2 (two) times daily.       . clonazePAM (KLONOPIN) 1 MG tablet Take 1 mg by mouth at bedtime as needed.        . dicyclomine (BENTYL) 10 MG capsule Take 10 mg by mouth as needed.        . fish oil-omega-3 fatty acids 1000 MG capsule Take 1 g by mouth daily.        . metoprolol succinate (TOPROL-XL) 25 MG 24 hr tablet Take 25 mg by mouth 2 (two) times daily.        . metoprolol tartrate (LOPRESSOR) 25 MG tablet Take 25 mg by mouth 2 (two) times daily.       . Multiple Vitamin (MULTIVITAMIN) tablet Take 1 tablet by mouth daily.        . nabumetone (RELAFEN) 750 MG tablet Take 750 mg by mouth 2 (two) times daily.        [name] omeprazole (PRILOSEC) 20 MG capsule Take 20 mg by mouth 2 (two) times daily.        [name] venlafaxine (EFFEXOR) 75 MG tablet Take 75 mg by mouth daily.          Allergies as of 07/24/2011  . (No Known Allergies)    No family history  on file.  History   Social History  . Marital Status: Widowed    Spouse Name: N/A    Number of Children: N/A  . Years of Education: N/A   Social History Main Topics  . Smoking status: Never Smoker   . Smokeless tobacco: Never Used  . Alcohol Use: No  . Drug Use: No  . Sexually Active: No   Other Topics Concern  . None   Social History Narrative  . None    Review of Systems: Gen: Denies fever, chills, anorexia. Denies fatigue, weakness, weight loss.  CV: Denies chest pain, palpitations, syncope, peripheral edema, and claudication. Resp: Denies dyspnea at rest, cough, wheezing, coughing up blood, and pleurisy. GI: Denies vomiting blood, jaundice, and fecal incontinence.   Denies dysphagia or odynophagia. Derm: Denies rash, itching, dry skin Psych: Denies depression, anxiety, memory loss, confusion. No homicidal or suicidal ideation.  Heme: Denies bruising, bleeding, and enlarged lymph nodes.  Physical Exam: BP 123/67  Pulse 64  Temp(Src) 97.4 F  (36.3 C) (Temporal)  Ht 5\' 8"  (1.727 m)  Wt 146 lb 12.8 oz (66.588 kg)  BMI 22.32 kg/m2 General:   Alert and oriented. No distress noted. Pleasant and cooperative.  Head:  Normocephalic and atraumatic. Eyes:  Conjuctiva clear without scleral icterus. Mouth:  Oral mucosa pink and moist. Good dentition. No lesions. Neck:  Supple, without mass or thyromegaly. Heart:  S1, S2 present without murmurs, rubs, or gallops. Regular rate and rhythm. Abdomen:  +BS, soft, non-tender and non-distended. No rebound or guarding. No HSM or masses noted. Msk:  Symmetrical without gross deformities. Normal posture. Extremities:  Without edema. Neurologic:  Alert and  oriented x4;  grossly normal neurologically. Skin:  Intact without significant lesions or rashes. Cervical Nodes:  No significant cervical adenopathy. Psych:  Alert and cooperative. Normal mood and affect.

## 2011-07-24 NOTE — Patient Instructions (Signed)
Stop omeprazole for now. Start taking Dexilant 1 capsule each morning. We have given you enough for 2 weeks. If you notice improvement with this, we will send a prescription to your pharmacy.  Please call us either way to let us know how you are doing.  We will see you back in 6 months or sooner if needed.

## 2011-07-25 ENCOUNTER — Ambulatory Visit: Payer: Medicare Other | Admitting: Gastroenterology

## 2011-07-25 LAB — CBC
HCT: 36.9
Hemoglobin: 12.6
RDW: 15.4 — ABNORMAL HIGH

## 2011-07-25 LAB — DIFFERENTIAL
Basophils Absolute: 0
Basophils Relative: 1
Neutro Abs: 3.4
Neutrophils Relative %: 69

## 2011-07-25 LAB — COMPREHENSIVE METABOLIC PANEL
Alkaline Phosphatase: 66
BUN: 12
Glucose, Bld: 109 — ABNORMAL HIGH
Potassium: 4.3
Total Bilirubin: 0.7
Total Protein: 6.4

## 2011-07-25 LAB — LIPASE, BLOOD: Lipase: 46

## 2011-07-25 NOTE — Assessment & Plan Note (Signed)
No warning signs currently. Seems to be food-related. Taking Prilosec BID. Will trial Dexilant, samples provided. If improvement, pt will request rx sent to pharmacy. 6 mos f/u.

## 2011-07-25 NOTE — Assessment & Plan Note (Signed)
None for 3 months. Occasional constipation, managed by pt without difficulty with stool softener. Does not want to proceed with colonoscopy. States would rather "die from cancer" instead of dealing with back problems. Discussed in detail risks/benefits. Daughter present. Still wants to hold off.   Will return in 6 mos for reassessment Instructed to call with problems in interim

## 2011-07-29 NOTE — Progress Notes (Signed)
Cc to PCP 

## 2011-07-30 ENCOUNTER — Telehealth: Payer: Self-pay | Admitting: Gastroenterology

## 2011-07-30 NOTE — Telephone Encounter (Signed)
Patient calling to say she is doing really good on the Dexilant and she wants a Rx called in to [facility] [phone]

## 2011-08-05 MED ORDER — DEXLANSOPRAZOLE 60 MG PO CPDR: 60.0000 mg | DELAYED_RELEASE_CAPSULE | Freq: Every day | ORAL | Status: DC

## 2011-08-05 NOTE — Telephone Encounter (Signed)
Completed.

## 2011-08-13 ENCOUNTER — Telehealth: Payer: Self-pay | Admitting: Gastroenterology

## 2011-08-13 NOTE — Telephone Encounter (Signed)
Make sure she is not eating several hours before bed. Does she have any difficulty swallowing?  Offer GERD handout. Continue Dexilant. OV if no improvement with these measures or difficulty swallowing is reported.  For diarrhea:  High fiber diet. May take Benefiber daily if needed. If truly having diarrhea, we can obtain a set of stool studies: Cdiff PCR, Giardia, lactoferrin, Stool culture.  If these symptoms persist, offer OV.

## 2011-08-13 NOTE — Telephone Encounter (Signed)
Informed Pam, pt's daughter. She said her Mom has the info on [place] and has studied it. She oaccasionally mentions a little problem with swallowing, but she complains about something all of the time she says. She is not having true diarrhea but Pam will call if she does have diarrhea. She will keep [place] posted as to how things go. She said she just needs help with her and don't know what to do. She has asked her Mom to come and live with her, but she refuses.

## 2011-08-13 NOTE — Telephone Encounter (Signed)
Pt's daughter was calling for patient. Whitney Galloway had questions regarding her medicines and wanted to speak with AS. I told daughter that AS was seeing pts, but I would have SF nurse call her back. She can be reached at [phone]

## 2011-08-13 NOTE — Telephone Encounter (Signed)
Returned daughter's call. Pam said her mom said that the Dexilant did help with the burning sensation. But now she is belching a lot and when she lies down, the food comes back up. She eats before late per daughter because of the problems that she has. Daughter also says that she states that she has diearrhea  sometimes and also says loose stools. ( Per daughter pt exaggerates so that she is not sure what problems she really has) she also complains of nausea some. Please advise! ( Call daughter's number  [phone] since Mom does not hear well.

## 2011-08-14 ENCOUNTER — Telehealth: Payer: Self-pay

## 2011-08-14 NOTE — Telephone Encounter (Signed)
Let's bring her in for an office visit. Seems she has a lot of questions, and needs some reassurance. Will be easier to go over things in person.

## 2011-08-14 NOTE — Telephone Encounter (Signed)
Pt's daughter Pam, left Vm. She said that her husband has recently been diagnosed with H. Pylori and she wonders if her Mom could have possibly got that from him. She said mom has same symptoms of the loose stools and nausea. Please advise! (Call back is [phone]).

## 2011-08-15 ENCOUNTER — Ambulatory Visit (INDEPENDENT_AMBULATORY_CARE_PROVIDER_SITE_OTHER): Payer: Medicare Other | Admitting: Gastroenterology

## 2011-08-15 ENCOUNTER — Encounter: Payer: Self-pay | Admitting: Gastroenterology

## 2011-08-15 VITALS — BP 108/56 | HR 54 | Temp 97.6°F | Ht 68.0 in | Wt 148.4 lb

## 2011-08-15 DIAGNOSIS — R1310 Dysphagia, unspecified: Secondary | ICD-10-CM

## 2011-08-15 DIAGNOSIS — K625 Hemorrhage of anus and rectum: Secondary | ICD-10-CM

## 2011-08-15 DIAGNOSIS — K219 Gastro-esophageal reflux disease without esophagitis: Secondary | ICD-10-CM

## 2011-08-15 NOTE — Patient Instructions (Signed)
We have set you up for an upper endoscopy to look at your esophagus and stomach.   Continue taking Prilosec twice a day.  Further recommendations to follow once this is completed.

## 2011-08-15 NOTE — Assessment & Plan Note (Signed)
76 year old female with hx of chronic GERD, last EGD in 2009 normal. Bravo study normal on BID Prevacid. Several weeks ago was switched to Dexilant, also started taking Doxycycline around same time. Conflicting reports of doing well with Dexilant noted in the phone notes, yet pt states caused increased GERD, belching. She stopped this and went back to Prilosec BID. Continued belching noted, early satiety, sensation of food "stuck" in epigastric region extending up to esophagus. Notably nauseated, worsened with food. Does have hx of delayed gastric emptying in 2009. Daughter concerned about H.pylori. Discussed options with pt; due to new onset dyspepsia, will evaluate with EGD. Continue Prilosec BID.  Proceed with upper endoscopy in the near future with Dr. Darrick Penna. The risks, benefits, and alternatives have been discussed in detail with patient. They have stated understanding and desire to proceed.

## 2011-08-15 NOTE — Progress Notes (Signed)
Referring Provider: Isabella Stalling, MD Primary Care Physician:  Isabella Stalling, MD Primary Gastroenterologist: Dr. Darrick Penna   Chief Complaint  Patient presents with  . Follow-up    HPI:   Whitney Galloway returns today at our request due to multiple phone calls from daughter in the interim from the last visit on Oct 17. I thought it best to address these in the office, as it was difficult to obtain all the information needed over the phone. She was seen Oct 17 for routine, 6 mos f/u due to hx of rectal bleeding. She had been referred to [facility] in the remote past for a colonoscopy due to torturous colon; she continues to refuse this. She has had no further rectal bleeding in the past 2-3 months. At last visit, reported occasional constipation with resolution by stool softener.  She has hx of reflux as well, and was taking Prilosec BID. She was given Dexilant at last visit. Around 6 days later, she called our office stating she was doing well on this, asking for a prescription.   She returns today stating started on Doxycycline by Dr. Margo Aye starting Nov 1. States was having belching, nocturnal reflux, belching at night with food coming up like a baby. Unsure what medicine caused it, stopped the Dexilant, and it stopped. Has been off Dexilant for a few days to a week. Still taking Doxycycline, NOT taking Dexilant. Now only have belching. Starts in afternoon, laying down makes worse. Feels stuffed from epigastric region up to throat. No epigastric pain. +nausea, worse with eating. Now went back to Prilosec BID. Daughter concerned about H.pylori.   For 2 days was constipated, Tuesday took a stool softener, this morning had 2 BMs. Normal.   Had delayed gastric emptying Nov 2009. Last EGD 2009.   Past Medical History  Diagnosis Date  . GERD (gastroesophageal reflux disease)   . HTN (hypertension)   . IBS (irritable bowel syndrome)   . Hernia of unspecified site of abdominal cavity without  mention of obstruction or gangrene   . Hiatal hernia   . Diverticulosis   . Gastritis   . Splenic lesion HAMARTOMA  . Hard of hearing   . Colon polyp 1994 Malignant polyp    2005 NL BE; 2006 incomplete TCS DUE TO REDUNDNACY  . Fibromyalgia   . TB (tuberculosis) AGE 69  . Osteoporosis   . S/P endoscopy 2007, 2009    2007: single distal esophageal erosion, s/p 51 F dilation, no web/ring/stricture noted; 2009: normal, Bravo with adequate suppression on Prevacid BID    Past Surgical History  Procedure Date  . Upper gastrointestinal endoscopy 2006  . Benign tumor removed from bilateral breast   . Colonoscopy w/ polypectomy   . Mass removed from roof of mouth   . Cataract surgery   . Lung surgery for pulmonary nodules   . Left wrist cyst removal   . X-stop implantation     Current Outpatient Prescriptions  Medication Sig Dispense Refill  . amlodipine-atorvastatin (CADUET) 10-20 MG per tablet Take 1 tablet by mouth daily. 20 mg      . calcium carbonate (TUMS - DOSED IN MG ELEMENTAL CALCIUM) 500 MG chewable tablet Chew 1 tablet by mouth as needed.        . clobetasol (TEMOVATE) 0.05 % cream Apply 1 application topically 2 (two) times daily.       . clonazePAM (KLONOPIN) 1 MG tablet Take 1 mg by mouth at bedtime as needed.        [name]  dexlansoprazole (DEXILANT) 60 MG capsule Take 1 capsule (60 mg total) by mouth daily.  30 capsule  5  . dicyclomine (BENTYL) 10 MG capsule Take 10 mg by mouth as needed.        . fish oil-omega-3 fatty acids 1000 MG capsule Take 1 g by mouth daily.        . metoprolol succinate (TOPROL-XL) 25 MG 24 hr tablet Take 25 mg by mouth 2 (two) times daily.       . metoprolol tartrate (LOPRESSOR) 25 MG tablet Take 25 mg by mouth 2 (two) times daily.       . Multiple Vitamin (MULTIVITAMIN) tablet Take 1 tablet by mouth daily.        . nabumetone (RELAFEN) 750 MG tablet Take 750 mg by mouth 2 (two) times daily.        [name] omeprazole (PRILOSEC) 20 MG capsule Take 20 mg by  mouth 2 (two) times daily.        . ORACEA 40 MG capsule Take 40 mg by mouth every morning.       . venlafaxine (EFFEXOR) 75 MG tablet Take 75 mg by mouth daily.          Allergies as of 08/15/2011  . (No Known Allergies)    No family history on file.  History   Social History  . Marital Status: Widowed    Spouse Name: N/A    Number of Children: N/A  . Years of Education: N/A   Social History Main Topics  . Smoking status: Never Smoker   . Smokeless tobacco: Never Used  . Alcohol Use: No  . Drug Use: No  . Sexually Active: No   Other Topics Concern  . None   Social History Narrative  . None    Review of Systems: Gen: Denies fever, chills, anorexia. Denies fatigue, weakness, weight loss.  CV: Denies chest pain, palpitations, syncope, peripheral edema, and claudication. Resp: Denies dyspnea at rest, cough, wheezing, coughing up blood, and pleurisy. GI: SEE HPI Derm: Denies rash, itching, dry skin Psych: Denies depression, anxiety, memory loss, confusion. No homicidal or suicidal ideation.  Heme: Denies bruising, bleeding, and enlarged lymph nodes.  Physical Exam: BP 108/56  Pulse 54  Temp(Src) 97.6 F (36.4 C) (Temporal)  Ht 5\' 8"  (1.727 m)  Wt 148 lb 6.4 oz (67.314 kg)  BMI 22.56 kg/m2 General:   Alert and oriented. No distress noted. Pleasant and cooperative.  Head:  Normocephalic and atraumatic. Eyes:  Conjuctiva clear without scleral icterus. Mouth:  Oral mucosa pink and moist. Good dentition. No lesions. Neck:  Supple, without mass or thyromegaly. Heart:  S1, S2 present without murmurs, rubs, or gallops. Regular rate and rhythm. Abdomen:  +BS, soft, non-tender and non-distended. No rebound or guarding. No HSM or masses noted. Msk:  Symmetrical without gross deformities. Normal posture. Extremities:  Without edema. Neurologic:  Alert and  oriented x4;  grossly normal neurologically. Skin:  Intact without significant lesions or rashes. Cervical Nodes:  No  significant cervical adenopathy. Psych:  Alert and cooperative. Normal mood and affect.

## 2011-08-15 NOTE — Assessment & Plan Note (Signed)
Last occurrence 3 months ago. Occasional constipation self-managed. Refusing updated colonoscopy at [facility] (hx of torturous colon).

## 2011-08-15 NOTE — Telephone Encounter (Signed)
Pt has OV today

## 2011-08-16 ENCOUNTER — Encounter (HOSPITAL_COMMUNITY): Payer: Self-pay | Admitting: Pharmacy Technician

## 2011-08-19 NOTE — Progress Notes (Signed)
Cc to PCP 

## 2011-08-26 MED ORDER — SODIUM CHLORIDE 0.45 % IV SOLN
Freq: Once | INTRAVENOUS | Status: AC
  Administered 2011-08-27: 11:00:00 via INTRAVENOUS

## 2011-08-27 ENCOUNTER — Ambulatory Visit (HOSPITAL_COMMUNITY)
Admission: RE | Admit: 2011-08-27 | Discharge: 2011-08-27 | Disposition: A | Discharge status: Home / Self Care | Payer: Medicare Other | Source: Ambulatory Visit | Attending: Gastroenterology | Admitting: Gastroenterology

## 2011-08-27 ENCOUNTER — Encounter (HOSPITAL_COMMUNITY)
Admission: RE | Disposition: A | Discharge status: Home / Self Care | Payer: Self-pay | Source: Ambulatory Visit | Attending: Gastroenterology

## 2011-08-27 ENCOUNTER — Other Ambulatory Visit: Payer: Self-pay | Admitting: Gastroenterology

## 2011-08-27 DIAGNOSIS — K319 Disease of stomach and duodenum, unspecified: Secondary | ICD-10-CM | POA: Insufficient documentation

## 2011-08-27 DIAGNOSIS — K2990 Gastroduodenitis, unspecified, without bleeding: Secondary | ICD-10-CM

## 2011-08-27 DIAGNOSIS — K219 Gastro-esophageal reflux disease without esophagitis: Secondary | ICD-10-CM | POA: Insufficient documentation

## 2011-08-27 DIAGNOSIS — K2970 Gastritis, unspecified, without bleeding: Secondary | ICD-10-CM | POA: Insufficient documentation

## 2011-08-27 DIAGNOSIS — K222 Esophageal obstruction: Secondary | ICD-10-CM | POA: Insufficient documentation

## 2011-08-27 DIAGNOSIS — Z79899 Other long term (current) drug therapy: Secondary | ICD-10-CM | POA: Insufficient documentation

## 2011-08-27 DIAGNOSIS — K449 Diaphragmatic hernia without obstruction or gangrene: Secondary | ICD-10-CM | POA: Insufficient documentation

## 2011-08-27 DIAGNOSIS — I10 Essential (primary) hypertension: Secondary | ICD-10-CM | POA: Insufficient documentation

## 2011-08-27 DIAGNOSIS — K3189 Other diseases of stomach and duodenum: Secondary | ICD-10-CM | POA: Insufficient documentation

## 2011-08-27 HISTORY — PX: ESOPHAGOGASTRODUODENOSCOPY: SHX5428

## 2011-08-27 HISTORY — PX: SAVORY DILATION: SHX5439

## 2011-08-27 SURGERY — EGD (ESOPHAGOGASTRODUODENOSCOPY)
Anesthesia: Moderate Sedation | Site: Esophagus

## 2011-08-27 MED ORDER — MEPERIDINE HCL 100 MG/ML IJ SOLN
INTRAMUSCULAR | Status: AC
  Filled 2011-08-27: qty 2

## 2011-08-27 MED ORDER — MIDAZOLAM HCL 5 MG/5ML IJ SOLN
INTRAMUSCULAR | Status: DC | PRN
  Administered 2011-08-27 (×2): 2 mg via INTRAVENOUS

## 2011-08-27 MED ORDER — OMEPRAZOLE 20 MG PO CPDR: DELAYED_RELEASE_CAPSULE | ORAL | Status: DC

## 2011-08-27 MED ORDER — STERILE WATER FOR IRRIGATION IR SOLN
Status: DC | PRN
  Administered 2011-08-27: 11:00:00

## 2011-08-27 MED ORDER — MIDAZOLAM HCL 5 MG/5ML IJ SOLN
INTRAMUSCULAR | Status: AC
  Filled 2011-08-27: qty 10

## 2011-08-27 MED ORDER — MEPERIDINE HCL 100 MG/ML IJ SOLN
INTRAMUSCULAR | Status: DC | PRN
  Administered 2011-08-27: 50 mg via INTRAVENOUS
  Administered 2011-08-27: 50 mg

## 2011-08-27 MED ORDER — MINERAL OIL PO OIL
TOPICAL_OIL | ORAL | Status: AC
  Filled 2011-08-27: qty 30

## 2011-08-27 MED ORDER — BUTAMBEN-TETRACAINE-BENZOCAINE 2-2-14 % EX AERO
INHALATION_SPRAY | CUTANEOUS | Status: DC | PRN
  Administered 2011-08-27: 2 via TOPICAL

## 2011-08-27 NOTE — Progress Notes (Signed)
REVIEWED. AGREE.  BPE 2005/2008-NL ESO MOTILITY, GERD, HH  Egd/dil today. PT ASKED TO RETURN IN 3 MOS BUT SHE WILL BE IN [facility] AND ASKED TO FOLLOW UP IN [facility] 2013.

## 2011-08-27 NOTE — Interval H&P Note (Signed)
History and Physical Interval Note:   08/27/2011   11:57 AM   Whitney Galloway  has presented today for surgery, with the diagnosis of dysphagia  The various methods of treatment have been discussed with the patient and family. After consideration of risks, benefits and other options for treatment, the patient has consented to  Procedure(s): ESOPHAGOGASTRODUODENOSCOPY (EGD) as a surgical intervention .  The patients' history has been reviewed, patient examined, no change in status, stable for surgery.  I have reviewed the patients' chart and labs.  Questions were answered to the patient's satisfaction.     [name]  MD  THE PATIENT WAS EXAMINED AND THERE IS NO CHANGE IN THE PATIENT'S CONDITION SINCE THE ORIGINAL H&P WAS COMPLETED.

## 2011-08-27 NOTE — H&P (Signed)
BP Pulse Temp(Src) Ht Wt BMI    108/56  54  97.6 F (36.4 C) (Temporal)  5\' 8"  (1.727 m)  148 lb 6.4 oz (67.314 kg)  22.56 kg/m2       Vitals History Recorded       Progress Notes     Gerrit Halls, NP  08/15/2011  3:36 PM  Signed   Referring Provider: Isabella Stalling, MD Primary Care Physician:  Isabella Stalling, MD Primary Gastroenterologist: Dr. Darrick Penna     Chief Complaint   Patient presents with   .  Follow-up      HPI:    Ms. Radich returns today at our request due to multiple phone calls from daughter in the interim from the last visit on Oct 17. I thought it best to address these in the office, as it was difficult to obtain all the information needed over the phone. She was seen Oct 17 for routine, 6 mos f/u due to hx of rectal bleeding. She had been referred to [facility] in the remote past for a colonoscopy due to torturous colon; she continues to refuse this. She has had no further rectal bleeding in the past 2-3 months. At last visit, reported occasional constipation with resolution by stool softener.   She has hx of reflux as well, and was taking Prilosec BID. She was given Dexilant at last visit. Around 6 days later, she called our office stating she was doing well on this, asking for a prescription.    She returns today stating started on Doxycycline by Dr. Margo Aye starting Nov 1. States was having belching, nocturnal reflux, belching at night with food coming up like a baby. Unsure what medicine caused it, stopped the Dexilant, and it stopped. Has been off Dexilant for a few days to a week. Still taking Doxycycline, NOT taking Dexilant. Now only have belching. Starts in afternoon, laying down makes worse. Feels stuffed from epigastric region up to throat. No epigastric pain. +nausea, worse with eating. Now went back to Prilosec BID. Daughter concerned about H.pylori.    For 2 days was constipated, Tuesday took a stool softener, this morning had 2 BMs. Normal.     Had delayed gastric emptying Nov 2009. Last EGD 2009.     Past Medical History   Diagnosis  Date   .  GERD (gastroesophageal reflux disease)     .  HTN (hypertension)     .  IBS (irritable bowel syndrome)     .  Hernia of unspecified site of abdominal cavity without mention of obstruction or gangrene     .  Hiatal hernia     .  Diverticulosis     .  Gastritis     .  Splenic lesion  HAMARTOMA   .  Hard of hearing     .  Colon polyp  1994 Malignant polyp       2005 NL BE; 2006 incomplete TCS DUE TO REDUNDNACY   .  Fibromyalgia     .  TB (tuberculosis)  AGE 38   .  Osteoporosis     .  S/P endoscopy  2007, 2009       2007: single distal esophageal erosion, s/p 65 F dilation, no web/ring/stricture noted; 2009: normal, Bravo with adequate suppression on Prevacid BID       Past Surgical History   Procedure  Date   .  Upper gastrointestinal endoscopy  2006   .  Benign  tumor removed from bilateral breast     .  Colonoscopy w/ polypectomy     .  Mass removed from roof of mouth     .  Cataract surgery     .  Lung surgery for pulmonary nodules     .  Left wrist cyst removal     .  X-stop implantation         Current Outpatient Prescriptions   Medication  Sig  Dispense  Refill   .  amlodipine-atorvastatin (CADUET) 10-20 MG per tablet  Take 1 tablet by mouth daily. 20 mg         .  calcium carbonate (TUMS - DOSED IN MG ELEMENTAL CALCIUM) 500 MG chewable tablet  Chew 1 tablet by mouth as needed.           .  clobetasol (TEMOVATE) 0.05 % cream  Apply 1 application topically 2 (two) times daily.          .  clonazePAM (KLONOPIN) 1 MG tablet  Take 1 mg by mouth at bedtime as needed.           [name]  dexlansoprazole (DEXILANT) 60 MG capsule  Take 1 capsule (60 mg total) by mouth daily.   30 capsule   5   .  dicyclomine (BENTYL) 10 MG capsule  Take 10 mg by mouth as needed.           .  fish oil-omega-3 fatty acids 1000 MG capsule  Take 1 g by mouth daily.           .  metoprolol succinate  (TOPROL-XL) 25 MG 24 hr tablet  Take 25 mg by mouth 2 (two) times daily.          .  metoprolol tartrate (LOPRESSOR) 25 MG tablet  Take 25 mg by mouth 2 (two) times daily.          .  Multiple Vitamin (MULTIVITAMIN) tablet  Take 1 tablet by mouth daily.           .  nabumetone (RELAFEN) 750 MG tablet  Take 750 mg by mouth 2 (two) times daily.           [name]  omeprazole (PRILOSEC) 20 MG capsule  Take 20 mg by mouth 2 (two) times daily.           .  ORACEA 40 MG capsule  Take 40 mg by mouth every morning.          .  venlafaxine (EFFEXOR) 75 MG tablet  Take 75 mg by mouth daily.               Allergies as of 08/15/2011   .  (No Known Allergies)      No family history on file.    History       Social History   .  Marital Status:  Widowed       Spouse Name:  N/A       Number of Children:  N/A   .  Years of Education:  N/A       Social History Main Topics   .  Smoking status:  Never Smoker    .  Smokeless tobacco:  Never Used   .  Alcohol Use:  No   .  Drug Use:  No   .  Sexually Active:  No       Other Topics  Concern   .  None  Social History Narrative   .  None      Review of Systems: Gen: Denies fever, chills, anorexia. Denies fatigue, weakness, weight loss.   CV: Denies chest pain, palpitations, syncope, peripheral edema, and claudication. Resp: Denies dyspnea at rest, cough, wheezing, coughing up blood, and pleurisy. GI: SEE HPI Derm: Denies rash, itching, dry skin Psych: Denies depression, anxiety, memory loss, confusion. No homicidal or suicidal ideation.   Heme: Denies bruising, bleeding, and enlarged lymph nodes.   Physical Exam: BP 108/56  Pulse 54  Temp(Src) 97.6 F (36.4 C) (Temporal)  Ht 5\' 8"  (1.727 m)  Wt 148 lb 6.4 oz (67.314 kg)  BMI 22.56 kg/m2 General:   Alert and oriented. No distress noted. Pleasant and cooperative.   Head:  Normocephalic and atraumatic. Eyes:  Conjuctiva clear without scleral icterus. Mouth:  Oral mucosa pink and  moist. Good dentition. No lesions. Neck:  Supple, without mass or thyromegaly. Heart:  S1, S2 present without murmurs, rubs, or gallops. Regular rate and rhythm. Abdomen:  +BS, soft, non-tender and non-distended. No rebound or guarding. No HSM or masses noted. Msk:  Symmetrical without gross deformities. Normal posture. Extremities:  Without edema. Neurologic:  Alert and  oriented x4;  grossly normal neurologically. Skin:  Intact without significant lesions or rashes. Cervical Nodes:  No significant cervical adenopathy. Psych:  Alert and cooperative. Normal mood and affect.     Glendora Score  08/19/2011 10:32 AM  Signed Cc to PCP     GASTROESOPHAGEAL REFLUX DISEASE, CHRONIC - Gerrit Halls, NP  08/15/2011  3:34 PM  Signed 77 year old female with hx of chronic GERD, last EGD in 2009 normal. Bravo study normal on BID Prevacid. Several weeks ago was switched to Dexilant, also started taking Doxycycline around same time. Conflicting reports of doing well with Dexilant noted in the phone notes, yet pt states caused increased GERD, belching. She stopped this and went back to Prilosec BID. Continued belching noted, early satiety, sensation of food "stuck" in epigastric region extending up to esophagus. Notably nauseated, worsened with food. Does have hx of delayed gastric emptying in 2009. Daughter concerned about H.pylori. Discussed options with pt; due to new onset dyspepsia, will evaluate with EGD. Continue Prilosec BID.   Proceed with upper endoscopy in the near future with Dr. Darrick Penna. The risks, benefits, and alternatives have been discussed in detail with patient. They have stated understanding and desire to proceed.      RECTAL BLEEDING - Gerrit Halls, NP  08/15/2011  3:35 PM  Signed Last occurrence 3 months ago. Occasional constipation self-managed. Refusing updated colonoscopy at [facility] (hx of torturous colon).

## 2011-09-02 ENCOUNTER — Telehealth: Payer: Self-pay | Admitting: Gastroenterology

## 2011-09-02 NOTE — Telephone Encounter (Signed)
Please call pt. Whitney Galloway stomach Bx shows mild gastritis. Continue OMP 30 minutes prior to meals TWICE DAILY FOR 3 MOS THEN ONCE DAILY. IF SHE IS HAVING belching, nocturnal reflux, belching at night with food coming up like a baby, THEN SHE SHOULD FOLLOW Whitney Galloway GASTROPARESIS DIET. Whitney Galloway STOMACH DOES NOT EMPTY WELL AND IF SHE EATS THE WRONG THINGS IT WILL MAKE Whitney Galloway HAVE THE UPSET STOMACH. WE WILL SEND Whitney Galloway A HO.  OPV IN 4 MOS E 30 VISIT.

## 2011-09-03 ENCOUNTER — Encounter (HOSPITAL_COMMUNITY): Payer: Self-pay | Admitting: Gastroenterology

## 2011-09-03 NOTE — Telephone Encounter (Signed)
Pt informed

## 2011-09-03 NOTE — Telephone Encounter (Signed)
LMOM to call. Gastroparesis diet mailed.

## 2011-09-05 NOTE — Progress Notes (Signed)
REVIEWED.  

## 2011-09-10 NOTE — Telephone Encounter (Signed)
Results Cc to PCP  

## 2011-09-10 NOTE — Telephone Encounter (Signed)
Pt is aware of OV on 3/20 @ 0900 with SF in E30 spot

## 2011-12-02 DIAGNOSIS — R5381 Other malaise: Secondary | ICD-10-CM | POA: Diagnosis not present

## 2011-12-02 DIAGNOSIS — D649 Anemia, unspecified: Secondary | ICD-10-CM | POA: Diagnosis not present

## 2011-12-12 DIAGNOSIS — M545 Low back pain, unspecified: Secondary | ICD-10-CM | POA: Diagnosis not present

## 2011-12-12 DIAGNOSIS — M48061 Spinal stenosis, lumbar region without neurogenic claudication: Secondary | ICD-10-CM | POA: Diagnosis not present

## 2011-12-19 ENCOUNTER — Ambulatory Visit (HOSPITAL_COMMUNITY)
Admission: RE | Admit: 2011-12-19 | Discharge: 2011-12-19 | Disposition: A | Discharge status: Home / Self Care | Payer: Medicare Other | Source: Ambulatory Visit | Attending: Anesthesiology | Admitting: Anesthesiology

## 2011-12-19 DIAGNOSIS — R262 Difficulty in walking, not elsewhere classified: Secondary | ICD-10-CM | POA: Insufficient documentation

## 2011-12-19 DIAGNOSIS — IMO0001 Reserved for inherently not codable concepts without codable children: Secondary | ICD-10-CM | POA: Insufficient documentation

## 2011-12-19 DIAGNOSIS — R29898 Other symptoms and signs involving the musculoskeletal system: Secondary | ICD-10-CM | POA: Insufficient documentation

## 2011-12-19 DIAGNOSIS — M545 Low back pain, unspecified: Secondary | ICD-10-CM | POA: Insufficient documentation

## 2011-12-19 DIAGNOSIS — M6281 Muscle weakness (generalized): Secondary | ICD-10-CM | POA: Diagnosis not present

## 2011-12-19 DIAGNOSIS — M2560 Stiffness of unspecified joint, not elsewhere classified: Secondary | ICD-10-CM | POA: Insufficient documentation

## 2011-12-19 DIAGNOSIS — I10 Essential (primary) hypertension: Secondary | ICD-10-CM | POA: Diagnosis not present

## 2011-12-19 NOTE — Evaluation (Signed)
Physical Therapy Evaluation  Patient Details  Name: Whitney Galloway MRN: [id number] Date of Birth: 04-Nov-1928  Today's Date: 12/19/2011 Time: 4098-1191 Time Calculation (min): 42 min  Visit#: 1  of 8   Re-eval: 01/18/12 Assessment Diagnosis: spinal stenosis Next MD Visit: 01/13/12 Prior Therapy: none  Past Medical History:  Past Medical History  Diagnosis Date  . GERD (gastroesophageal reflux disease)   . HTN (hypertension)   . IBS (irritable bowel syndrome)   . Hernia of unspecified site of abdominal cavity without mention of obstruction or gangrene   . Hiatal hernia   . Diverticulosis   . Gastritis   . Splenic lesion HAMARTOMA  . Hard of hearing   . Colon polyp 1994 Malignant polyp    2005 NL BE; 2006 incomplete TCS DUE TO REDUNDNACY  . Fibromyalgia   . TB (tuberculosis) AGE 78  . Osteoporosis   . S/P endoscopy 2007, 2009    2007: single distal esophageal erosion, s/p 7 F dilation, no web/ring/stricture noted; 2009: normal, Bravo with adequate suppression on Prevacid BID   Past Surgical History:  Past Surgical History  Procedure Date  . Upper gastrointestinal endoscopy 2006  . Benign tumor removed from bilateral breast   . Colonoscopy w/ polypectomy   . Mass removed from roof of mouth   . Cataract surgery   . Lung surgery for pulmonary nodules   . Left wrist cyst removal   . X-stop implantation   . Esophagogastroduodenoscopy 08/27/2011    Procedure: ESOPHAGOGASTRODUODENOSCOPY (EGD);  Surgeon: Arlyce Harman, MD;  [facility];  Service: Endoscopy;  Laterality: N/A;  11:30  . Savory dilation 08/27/2011    Procedure: SAVORY DILATION;  Surgeon: Arlyce Harman, MD;  Location: [facility];  Service: Endoscopy;  Laterality: N/A;    Subjective Symptoms/Limitations Symptoms: Whitney Galloway states that she has ben having low back pain since she was a teenager but no MD would do anything.  Pt states that she has been in the ER multiple times.  She has been  diagnosed with osteoporosis but no treatment was recommended.   The patient states that when she finally stated that she was having trouble walking her MD ordered an MRI which showed that she had spinal stenosis.  The patient states that most of her pain is in her left leg radiates into the groin and goes down into her foot.    She states she is fearful of falling due to not having control of her left leg.  She is now being referred to PT. How long can you sit comfortably?: The patient states that she usually can shift herself around to get some relief.  She will start shifting after about 30  minutes. How long can you stand comfortably?: The patient states that she can stand for ten to fifteen minutes. How long can you walk comfortably?: The patient is only able to walk less than five minutes if she is not holding onto a cart.   Pain Assessment Currently in Pain?: Yes Pain Score:   1 (worst 8/10; best 1/10) Pain Location: Back Pain Orientation: Left;Lower Pain Type: Chronic pain Pain Radiating Towards: L groin and foot. Pain Onset: More than a month ago Pain Frequency: Constant Pain Relieving Factors: lying down. Effect of Pain on Daily Activities: increases.  Precautions/Restrictions   falls  Prior Functioning  Prior Function Leisure: Hobbies-yes (Comment) Comments: yard work/ walking  Cognition/Observation Cognition Overall Cognitive Status: Appears within functional limits for tasks assessed  Sensation/Coordination/Flexibility/Functional  Tests Functional Tests Functional Tests: Oswestry 27/50  Assessment RLE Strength Right Hip Flexion: 5/5 Right Hip Extension: 5/5 Right Hip ABduction: 5/5 Right Knee Flexion: 5/5 Right Knee Extension: 5/5 Right Ankle Dorsiflexion: 3+/5 LLE Strength Left Hip Flexion: 2-/5 (limiting factor is pain.) Left Hip Extension: 3-/5 Left Hip ABduction: 3-/5 Left Hip ADduction: 3-/5 Left Knee Flexion: 3-/5 Left Knee Extension: 5/5 Left Ankle  Dorsiflexion: 3/5 Lumbar AROM Lumbar Flexion:  (wnl reps causes increased pain) Lumbar Extension:  (decreased 50% with reps increasing pain) Lumbar - Right Side Bend:  (wnl- going to right is fine coming back to upright caused L ) Lumbar - Left Side Bend: decreased 100% with leg pain Lumbar - Right Rotation: decreased 20% Lumbar - Left Rotation: decreased 60% Palpation Palpation: tight mm B   Mobility/Balance  Ambulation/Gait Ambulation/Gait: Yes Ambulation/Gait Assistance: 7: Independent Ambulation Distance (Feet):  (100) Assistive device: None Gait Pattern: Antalgic;Lateral trunk lean to right Gait velocity:  (normal) Posture/Postural Control Posture/Postural Control:  (Standing hips are to the left.  Pt has forward head, increased kyphosis.)   There ex: Pt shown and given Meeks postural decompressive exercises.     Physical Therapy Assessment and Plan PT Assessment and Plan Clinical Impression Statement: Pt with spinal stenosis, oseteoporosis, tight mm, weakened LE who will benefit from skilled PT To decrease pain improve functional mobility and improve quality of life. Rehab Potential: Good Clinical Impairments Affecting Rehab Potential: weakness, pain, PT Frequency: Min 2X/week PT Duration: 4 weeks PT Treatment/Interventions: Therapeutic exercise;Gait training;Patient/family education (modalities and manual techniques as needed.) PT Plan: begin Meeks T-band exercises next visit (supine); third visit begin core strengthening.    Goals Home Exercise Program Pt will Perform Home Exercise Program: Independently PT Short Term Goals PT Short Term Goal 1: Pt pain to be decreased by 3 levels PT Long Term Goals Time to Complete Long Term Goals: 4 weeks PT Long Term Goal 1: I in advance HEP PT Long Term Goal 2: Pt to be able to walk in an upright position. Long Term Goal 3: Pt pain to be decreased by 5 levels. Long Term Goal 4: Pt to be able to work out in her yard without  increase pain  Problem List Patient Active Problem List  Diagnoses  . COLONIC POLYPS, ADENOMATOUS  . UNSPECIFIED DISEASE OF SPLEEN  . DEPRESSION/ANXIETY  . DECREASED HEARING  . HYPERTENSION  . ALLERGIC RHINITIS, SEASONAL  . GASTROESOPHAGEAL REFLUX DISEASE, CHRONIC  . GASTRITIS  . DUODENITIS  . GASTROPARESIS  . HIATAL HERNIA  . DIVERTICULOSIS OF COLON  . CONSTIPATION, CHRONIC  . IRRITABLE BOWEL SYNDROME  . RECTAL BLEEDING  . ECZEMA  . OSTEOARTHRITIS, SPINE  . DEGENERATIVE DISC DISEASE  . NAUSEA AND VOMITING  . OTHER DYSPHAGIA  . ABDOMINAL BLOATING  . FECAL INCONTINENCE  . ABNORMAL FECES  . DIARRHEA, BLOODY  . ABDOMINAL PAIN, LOWER  . PUD, HX OF  . TOBACCO USE, QUIT  . Stiffness of joints, not elsewhere classified, multiple sites  . Weakness of left leg  . Difficulty in walking    PT - End of Session Activity Tolerance: Patient tolerated treatment well General Behavior During Session: [facility] for tasks performed Cognition: [facility] for tasks performed PT Plan of Care PT Home Exercise Plan: [facility] decompressive ex given. Consulted and Agree with Plan of Care: Patient  GP  Functional Reporting Modifier  Current Status  [phone] - Mobility: Walking & Moving Around Ck - At least 40% but less than 60% impaired, limited or restricted  Goal Status  [id number] - Mobility: [place] & Moving Around CI - At least 1% but less than 20% impaired, limited or restricted  I chose CK due to pt Oswestry places disability at 30% but pt states she is unable to walk more than 5 minutes without holding onto a cart.  Pt does not desire to use a walker or cane  Bora Bost,CINDY 12/19/2011, 5:00 PM  Physician Documentation Your signature is required to indicate approval of the treatment plan as stated above.  Please sign and either send electronically or make a copy of this report for your files and return this physician signed original.   Please mark one 1.__approve of plan  2. ___approve of plan with the  following conditions.   ______________________________                                                          _____________________ Physician Signature                                                                                                             Date

## 2011-12-24 ENCOUNTER — Ambulatory Visit (HOSPITAL_COMMUNITY): Payer: Medicare Other | Admitting: Physical Therapy

## 2011-12-24 ENCOUNTER — Telehealth (HOSPITAL_COMMUNITY): Payer: Self-pay

## 2011-12-25 ENCOUNTER — Ambulatory Visit: Payer: Medicare Other | Admitting: Gastroenterology

## 2011-12-26 ENCOUNTER — Encounter: Payer: Self-pay | Admitting: Gastroenterology

## 2011-12-26 ENCOUNTER — Ambulatory Visit (INDEPENDENT_AMBULATORY_CARE_PROVIDER_SITE_OTHER): Payer: Medicare Other | Admitting: Gastroenterology

## 2011-12-26 ENCOUNTER — Ambulatory Visit (HOSPITAL_COMMUNITY)
Admission: RE | Admit: 2011-12-26 | Discharge: 2011-12-26 | Disposition: A | Discharge status: Home / Self Care | Payer: Medicare Other | Source: Ambulatory Visit | Attending: Anesthesiology | Admitting: Anesthesiology

## 2011-12-26 VITALS — BP 115/68 | HR 65 | Temp 97.6°F | Ht 68.0 in | Wt 145.4 lb

## 2011-12-26 DIAGNOSIS — R61 Generalized hyperhidrosis: Secondary | ICD-10-CM | POA: Insufficient documentation

## 2011-12-26 DIAGNOSIS — K219 Gastro-esophageal reflux disease without esophagitis: Secondary | ICD-10-CM | POA: Diagnosis not present

## 2011-12-26 DIAGNOSIS — R112 Nausea with vomiting, unspecified: Secondary | ICD-10-CM

## 2011-12-26 MED ORDER — OMEPRAZOLE 20 MG PO CPDR: DELAYED_RELEASE_CAPSULE | ORAL | Status: DC

## 2011-12-26 NOTE — Progress Notes (Signed)
Physical Therapy Treatment Patient Details  Name: Whitney Galloway MRN: [id number] Date of Birth: 11-20-1928  Today's Date: 12/26/2011 Time: 4098-1191 Time Calculation (min): 44 min Visit#: 2  of 8   Re-eval: 01/18/12 Charges:  therex 40'    Subjective: Symptoms/Limitations Symptoms: Pt. reports she's been compliant with decompression exercises.  Pt. states she has been having cramps in her LE's (calf/ankle/feet) since beginning exercises. Pt. currently has little pain. Pain Assessment Currently in Pain?: Yes Pain Score:   1 Pain Location: Back Pain Orientation: Lower   Exercise/Treatments Stretches Active Hamstring Stretch: 2 reps;30 seconds Stability Decompression exercises 1-5, 5 reps each Decompression theraband exercises 5 reps each     Physical Therapy Assessment and Plan PT Assessment and Plan Clinical Impression Statement: Pt. demonstated decompression exercises 1-5 correctly with minimal cueing; added theraband decompression exercises with good form and control.  Pt. instructed with hamstring/calf stretch in supine and long sitting to help decrease mm spasms.  Pt. given HEP instructions and tband for home use. PT Plan: Review T Band decompression exercises and begin core strengthening exercises next visit per PT POC.     Problem List Patient Active Problem List  Diagnoses  . COLONIC POLYPS, ADENOMATOUS  . UNSPECIFIED DISEASE OF SPLEEN  . DEPRESSION/ANXIETY  . DECREASED HEARING  . HYPERTENSION  . ALLERGIC RHINITIS, SEASONAL  . GASTROESOPHAGEAL REFLUX DISEASE, CHRONIC  . GASTRITIS  . DUODENITIS  . GASTROPARESIS  . HIATAL HERNIA  . DIVERTICULOSIS OF COLON  . CONSTIPATION, CHRONIC  . IRRITABLE BOWEL SYNDROME  . RECTAL BLEEDING  . ECZEMA  . OSTEOARTHRITIS, SPINE  . DEGENERATIVE DISC DISEASE  . NAUSEA AND VOMITING  . OTHER DYSPHAGIA  . ABDOMINAL BLOATING  . FECAL INCONTINENCE  . ABNORMAL FECES  . DIARRHEA, BLOODY  . ABDOMINAL PAIN, LOWER  . PUD, HX  OF  . TOBACCO USE, QUIT  . Stiffness of joints, not elsewhere classified, multiple sites  . Weakness of left leg  . Difficulty in walking  . Night sweats    PT - End of Session Activity Tolerance: Patient tolerated treatment well General Behavior During Session: [facility] for tasks performed Cognition: [facility] for tasks performed PT Plan of Care PT Home Exercise Plan: Pt. given TBand and written instructions for advanced decompression exercises   Noe Goyer B. Bascom Levels, PTA 12/26/2011, 5:12 PM

## 2011-12-26 NOTE — Assessment & Plan Note (Signed)
HX: TB AGE 78. NO CERVICAL OR INGUINAL LA.  PT ENCOURAGED TO FOLLOW UP WITH DR. HALL.

## 2011-12-26 NOTE — Patient Instructions (Signed)
TAKE PRILOSEC 30 MINUTES PRIOR TO MEALS TWICE DAILY.  FOLLOW  A LOW FAT DIET. SEE INFO BELOW.  FOLLOW UP IN 3 MOS.  Low-Fat Diet BREADS, CEREALS, PASTA, RICE, DRIED PEAS, AND BEANS These products are high in carbohydrates and most are low in fat. Therefore, they can be increased in the diet as substitutes for fatty foods. They too, however, contain calories and should not be eaten in excess. Cereals can be eaten for snacks as well as for breakfast.  Include foods that contain fiber (fruits, vegetables, whole grains, and legumes). Research shows that fiber may lower blood cholesterol levels, especially the water-soluble fiber found in fruits, vegetables, oat products, and legumes. FRUITS AND VEGETABLES It is good to eat fruits and vegetables. Besides being sources of fiber, both are rich in vitamins and some minerals. They help you get the daily allowances of these nutrients. Fruits and vegetables can be used for snacks and desserts. MEATS Limit lean meat, chicken, [place], and fish to no more than 6 ounces per day. Beef, Pork, and Lamb Use lean cuts of beef, pork, and lamb. Lean cuts include:  Extra-lean ground beef.  Arm roast.  Sirloin tip.  Center-cut ham.  Round steak.  Loin chops.  Rump roast.  Tenderloin.  Trim all fat off the outside of meats before cooking. It is not necessary to severely decrease the intake of red meat, but lean choices should be made. Lean meat is rich in protein and contains a highly absorbable form of iron. Premenopausal women, in particular, should avoid reducing lean red meat because this could increase the risk for low red blood cells (iron-deficiency anemia).  Chicken and [place] These are good sources of protein. The fat of poultry can be reduced by removing the skin and underlying fat layers before cooking. Chicken and [place] can be substituted for lean red meat in the diet. Poultry should not be fried or covered with high-fat sauces. Fish and  Shellfish Fish is a good source of protein. Shellfish contain cholesterol, but they usually are low in saturated fatty acids. The preparation of fish is important. Like chicken and [place], they should not be fried or covered with high-fat sauces. EGGS Egg whites contain no fat or cholesterol. They can be eaten often. Try 1 to 2 egg whites instead of whole eggs in recipes or use egg substitutes that do not contain yolk.  MILK AND DAIRY PRODUCTS Use skim or 1% milk instead of 2% or whole milk. Decrease whole milk, natural, and processed cheeses. Use nonfat or low-fat (2%) cottage cheese or low-fat cheeses made from vegetable oils. Choose nonfat or low-fat (1 to 2%) yogurt. Experiment with evaporated skim milk in recipes that call for heavy cream. Substitute low-fat yogurt or low-fat cottage cheese for sour cream in dips and salad dressings. Have at least 2 servings of low-fat dairy products, such as 2 glasses of skim (or 1%) milk each day to help get your daily calcium intake.  FATS AND OILS Butterfat, lard, and beef fats are high in saturated fat and cholesterol. These should be avoided.Vegetable fats do not contain cholesterol. AVOID coconut oil, palm oil, and palm kernel oil, WHICH are very high in saturated fats. These should be limited. These fats are often used in bakery goods, processed foods, popcorn, oils, and nondairy creamers. Vegetable shortenings and some peanut butters contain hydrogenated oils, which are also saturated fats. Read the labels on these foods and check for saturated vegetable oils.  Desirable liquid vegetable oils are  corn oil, cottonseed oil, olive oil, canola oil, safflower oil, soybean oil, and sunflower oil. Peanut oil is not as good, but small amounts are acceptable. Buy a heart-healthy tub margarine that has no partially hydrogenated oils in the ingredients. AVOID Mayonnaise and salad dressings often are made from unsaturated fats.  OTHER EATING TIPS Snacks  Most sweets  should be limited as snacks. They tend to be rich in calories and fats, and their caloric content outweighs their nutritional value. Some good choices in snacks are graham crackers, melba toast, soda crackers, bagels (no egg), English muffins, fruits, and vegetables. These snacks are preferable to snack crackers, [place] fries, and chips. Popcorn should be air-popped or cooked in small amounts of liquid vegetable oil.  Desserts Eat fruit, low-fat yogurt, and fruit ices instead of pastries, cake, and cookies. Sherbet, angel food cake, gelatin dessert, frozen low-fat yogurt, or other frozen products that do not contain saturated fat (pure fruit juice bars, frozen ice pops) are also acceptable.   COOKING METHODS Choose those methods that use little or no fat. They include: Poaching.  Braising.  Steaming.  Grilling.  Baking.  Stir-frying.  Broiling.  Microwaving.  Foods can be cooked in a nonstick pan without added fat, or use a nonfat cooking spray in regular cookware. Limit fried foods and avoid frying in saturated fat. Add moisture to lean meats by using water, broth, cooking wines, and other nonfat or low-fat sauces along with the cooking methods mentioned above. Soups and stews should be chilled after cooking. The fat that forms on top after a few hours in the refrigerator should be skimmed off. When preparing meals, avoid using excess salt. Salt can contribute to raising blood pressure in some people.  EATING AWAY FROM HOME Order entres, potatoes, and vegetables without sauces or butter. When meat exceeds the size of a deck of cards (3 to 4 ounces), the rest can be taken home for another meal. Choose vegetable or fruit salads and ask for low-calorie salad dressings to be served on the side. Use dressings sparingly. Limit high-fat toppings, such as bacon, crumbled eggs, cheese, sunflower seeds, and olives. Ask for heart-healthy tub margarine instead of butter.

## 2011-12-26 NOTE — Assessment & Plan Note (Signed)
NOT IDEALLY CONTROLLED.  INCREASE OMP TO BID. FOLLOW A LOW FAT DIET. OPV IN 3 MOS.

## 2011-12-26 NOTE — Progress Notes (Signed)
Subjective:    Patient ID: Whitney Galloway, female    DOB: 1929-01-28, 78 y.o.   MRN: [id number]  PCP: HALL  HPI PT IS HOH. CONCERNED ABOUT HER KIDNEYS-DARK & NOW IT'S A GREENISH COLOR, BREAKING OUT IN A SWEAT EVERY NIGHT. FEELING FATIGUE & SLEEPY. LOST OF ACID REFLUX. NOT FOLLOWING HER LOW FAT DIET. TAKING OMEPRAZOLE  ONCE A DAY. REFLUX NOT CONTROLLED. C/O EARLY AM NAUSEA AFTER EATING THAT LASTS UNTIL NOON.  GOING BACK & FORTH TO [place] TO SEE A VERY SPECIAL FEMALE FRIEND.  Past Medical History  Diagnosis Date  . GERD (gastroesophageal reflux disease)   . HTN (hypertension)   . IBS (irritable bowel syndrome)   . Hernia of unspecified site of abdominal cavity without mention of obstruction or gangrene   . Hiatal hernia   . Diverticulosis   . Gastritis   . Splenic lesion HAMARTOMA  . Hard of hearing   . Colon polyp 1994 Malignant polyp    2005 NL BE; 2006 incomplete TCS DUE TO REDUNDNACY  . Fibromyalgia   . TB (tuberculosis) AGE 48  . Osteoporosis   . S/P endoscopy 2007, 2009    2007: single distal esophageal erosion, s/p 75 F dilation, no web/ring/stricture noted; 2009: normal, Bravo with adequate suppression on Prevacid BID   Past Surgical History  Procedure Date  . Upper gastrointestinal endoscopy 2006  . Benign tumor removed from bilateral breast   . Colonoscopy w/ polypectomy   . Mass removed from roof of mouth   . Cataract surgery   . Lung surgery for pulmonary nodules   . Left wrist cyst removal   . X-stop implantation   . Esophagogastroduodenoscopy 08/27/2011    Procedure: ESOPHAGOGASTRODUODENOSCOPY (EGD);  Surgeon: Arlyce Harman, MD;  [facility];  Service: Endoscopy;  Laterality: N/A;  11:30  . Savory dilation 08/27/2011    Procedure: SAVORY DILATION;  Surgeon: Arlyce Harman, MD;  [facility]: [facility];  Service: Endoscopy;  Laterality: N/A;    No Known Allergies  Current Outpatient Prescriptions  Medication Sig Dispense Refill  .  amlodipine-atorvastatin (CADUET) 10-20 MG per tablet Take 1 tablet by mouth daily. 20 mg      . calcium carbonate (TUMS - DOSED IN MG ELEMENTAL CALCIUM) 500 MG chewable tablet Chew 1 tablet by mouth as needed. gas      . clobetasol (TEMOVATE) 0.05 % cream Apply 1 application topically 2 (two) times daily as needed. rash      . clonazePAM (KLONOPIN) 1 MG tablet Take 1 mg by mouth at bedtime as needed. anxiety      . dicyclomine (BENTYL) 10 MG capsule Take 10 mg by mouth as needed. Stomach pain      . fish oil-omega-3 fatty acids 1000 MG capsule Take 1 g by mouth daily.        . metoprolol succinate (TOPROL-XL) 25 MG 24 hr tablet Take 25 mg by mouth 2 (two) times daily.       . Multiple Vitamin (MULTIVITAMIN) tablet Take 1 tablet by mouth daily.        . nabumetone (RELAFEN) 750 MG tablet Take 750 mg by mouth 2 (two) times daily.      [name] omeprazole (PRILOSEC) 20 MG capsule 1 po bid for 3 mos then once daily    . ORACEA 40 MG capsule Take 40 mg by mouth every morning.       . venlafaxine (EFFEXOR) 75 MG tablet Take 75 mg by mouth daily.  Review of Systems     Objective:   Physical Exam  Vitals reviewed. Constitutional: She is oriented to person, place, and time. She appears well-nourished. No distress.  HENT:  Head: Normocephalic and atraumatic.  Mouth/Throat: Oropharynx is clear and moist. No oropharyngeal exudate.  Eyes: Pupils are equal, round, and reactive to light. No scleral icterus.  Neck: Normal range of motion. Neck supple.  Cardiovascular: Normal rate, regular rhythm and normal heart sounds.   Pulmonary/Chest: Effort normal and breath sounds normal. No respiratory distress.  Abdominal: Soft. Bowel sounds are normal. She exhibits no distension. There is no tenderness.       NO INGUINAL LA BIL  Musculoskeletal: Normal range of motion. She exhibits no edema.  Lymphadenopathy:    She has no cervical adenopathy.  Neurological: She is alert and oriented to person,  place, and time.       NO  NEW FOCAL DEFICITS   Psychiatric: She has a normal mood and affect.          Assessment & Plan:

## 2011-12-26 NOTE — Assessment & Plan Note (Signed)
MOST LIKELY DUE TO UNCONTROLLED GERD/GASTRITIS/GASTROPARESIS.  FOLLOW A LOW FAT DIET. OPV IN 3 MOS.

## 2011-12-30 NOTE — Progress Notes (Signed)
Faxed to PCP

## 2011-12-30 NOTE — Progress Notes (Signed)
Reminder in epic to follow up in 3 months with SF in E30 

## 2011-12-31 ENCOUNTER — Ambulatory Visit (HOSPITAL_COMMUNITY)
Admission: RE | Admit: 2011-12-31 | Discharge: 2011-12-31 | Disposition: A | Discharge status: Home / Self Care | Payer: Medicare Other | Source: Ambulatory Visit | Attending: Anesthesiology | Admitting: Anesthesiology

## 2011-12-31 NOTE — Progress Notes (Signed)
Physical Therapy Treatment Patient Details  Name: Whitney Galloway MRN: [id number] Date of Birth: Jan 06, 1929  Today's Date: 12/31/2011 Time: 1520-1600 Time Calculation (min): 40 min Visit#: 3  of 8   Re-eval: 01/18/12 Charges: Therex x 38'  Subjective: Symptoms/Limitations Symptoms: Pt states that her steroid shots have worn off so her pain is worse today. Pain Assessment Currently in Pain?: Yes Pain Score:   6 Pain Location: Back Pain Orientation: Lower   Exercise/Treatments Stretches Active Hamstring Stretch: 3 reps;30 seconds Single Knee to Chest Stretch: 3 reps;30 seconds Lower Trunk Rotation: 5 reps;10 seconds Stability Clam: 5 reps;Limitations Clam Limitations: 10" holds Bridge: 10 reps Bent Knee Raise: 10 reps AB Set Limitations: pelvic floor x 10 Straight Leg Raise: 10 reps Functional Squats: 10 reps  Physical Therapy Assessment and Plan PT Assessment and Plan Clinical Impression Statement: Pt reports that she is completing decompression exercises at home and does not have any questions about them. Began stabilization exercises to improve core strength. Pt reports pain increase to 7/10 after functional squats. Pt states that her pain always increases with standing. PT Plan: Continue to progress per PT POC.     Problem List Patient Active Problem List  Diagnoses  . COLONIC POLYPS, ADENOMATOUS  . UNSPECIFIED DISEASE OF SPLEEN  . DEPRESSION/ANXIETY  . DECREASED HEARING  . HYPERTENSION  . ALLERGIC RHINITIS, SEASONAL  . GASTROESOPHAGEAL REFLUX DISEASE, CHRONIC  . GASTRITIS  . DUODENITIS  . GASTROPARESIS  . HIATAL HERNIA  . DIVERTICULOSIS OF COLON  . CONSTIPATION, CHRONIC  . IRRITABLE BOWEL SYNDROME  . RECTAL BLEEDING  . ECZEMA  . OSTEOARTHRITIS, SPINE  . DEGENERATIVE DISC DISEASE  . NAUSEA AND VOMITING  . OTHER DYSPHAGIA  . ABDOMINAL BLOATING  . FECAL INCONTINENCE  . ABNORMAL FECES  . DIARRHEA, BLOODY  . ABDOMINAL PAIN, LOWER  . PUD, HX OF  .  TOBACCO USE, QUIT  . Stiffness of joints, not elsewhere classified, multiple sites  . Weakness of left leg  . Difficulty in walking  . Night sweats    PT - End of Session Activity Tolerance: Patient tolerated treatment well General Behavior During Session: [facility] for tasks performed Cognition: [facility] for tasks performed    Seth Bake, PTA 12/31/2011, 4:12 PM

## 2012-01-02 ENCOUNTER — Ambulatory Visit (HOSPITAL_COMMUNITY)
Admission: RE | Admit: 2012-01-02 | Discharge: 2012-01-02 | Disposition: A | Discharge status: Home / Self Care | Payer: Medicare Other | Source: Ambulatory Visit | Attending: Anesthesiology | Admitting: Anesthesiology

## 2012-01-02 NOTE — Progress Notes (Signed)
Physical Therapy Treatment Patient Details  Name: Whitney Galloway MRN: [id number] Date of Birth: November 27, 1928  Today's Date: 01/02/2012 Time: 1520-1601 Time Calculation (min): 41 min Visit#: 4  of 8   Re-eval: 01/18/12 Charges: Therex x 39'  Subjective: Symptoms/Limitations Symptoms: Pt is not currently in pain. She states it comes and goes any time. It doesn't matter what she is doing. Pain Assessment Currently in Pain?: No/denies Pain Score: 0-No pain   Exercise/Treatments Stretches Active Hamstring Stretch: 3 reps;30 seconds Single Knee to Chest Stretch: 3 reps;30 seconds Lower Trunk Rotation: 5 reps;10 seconds Stability Bridge: 10 reps Bent Knee Raise: 10 reps AB Set Limitations: pelvic floor x 10 Straight Leg Raise: 10 reps Functional Squats: 10 reps  Physical Therapy Assessment and Plan PT Assessment and Plan Clinical Impression Statement: Pt completes exercises well with decreased need for cueing. Pt completes functional squats with increased ease. Pt educated on importance of functional squats when bending or lifting. Pt complains of radicular pain down L anterior thigh while lying in supine. Radicular sx decreased when pt obtained hook lying position. Pt reports 0/10 pain at end of session. PT Plan: Continue to progress per PT POC.     Problem List Patient Active Problem List  Diagnoses  . COLONIC POLYPS, ADENOMATOUS  . UNSPECIFIED DISEASE OF SPLEEN  . DEPRESSION/ANXIETY  . DECREASED HEARING  . HYPERTENSION  . ALLERGIC RHINITIS, SEASONAL  . GASTROESOPHAGEAL REFLUX DISEASE, CHRONIC  . GASTRITIS  . DUODENITIS  . GASTROPARESIS  . HIATAL HERNIA  . DIVERTICULOSIS OF COLON  . CONSTIPATION, CHRONIC  . IRRITABLE BOWEL SYNDROME  . RECTAL BLEEDING  . ECZEMA  . OSTEOARTHRITIS, SPINE  . DEGENERATIVE DISC DISEASE  . NAUSEA AND VOMITING  . OTHER DYSPHAGIA  . ABDOMINAL BLOATING  . FECAL INCONTINENCE  . ABNORMAL FECES  . DIARRHEA, BLOODY  . ABDOMINAL PAIN, LOWER   . PUD, HX OF  . TOBACCO USE, QUIT  . Stiffness of joints, not elsewhere classified, multiple sites  . Weakness of left leg  . Difficulty in walking  . Night sweats    PT - End of Session Activity Tolerance: Patient tolerated treatment well General Behavior During Session: [facility] for tasks performed Cognition: [facility] for tasks performed    Seth Bake, PTA 01/02/2012, 4:26 PM

## 2012-01-07 ENCOUNTER — Ambulatory Visit (HOSPITAL_COMMUNITY)
Admission: RE | Admit: 2012-01-07 | Discharge: 2012-01-07 | Disposition: A | Discharge status: Home / Self Care | Payer: Medicare Other | Source: Ambulatory Visit | Attending: Internal Medicine | Admitting: Internal Medicine

## 2012-01-07 DIAGNOSIS — M545 Low back pain, unspecified: Secondary | ICD-10-CM | POA: Diagnosis not present

## 2012-01-07 DIAGNOSIS — IMO0001 Reserved for inherently not codable concepts without codable children: Secondary | ICD-10-CM | POA: Insufficient documentation

## 2012-01-07 DIAGNOSIS — I10 Essential (primary) hypertension: Secondary | ICD-10-CM | POA: Insufficient documentation

## 2012-01-07 DIAGNOSIS — M6281 Muscle weakness (generalized): Secondary | ICD-10-CM | POA: Diagnosis not present

## 2012-01-07 DIAGNOSIS — R262 Difficulty in walking, not elsewhere classified: Secondary | ICD-10-CM | POA: Diagnosis not present

## 2012-01-07 NOTE — Progress Notes (Signed)
Physical Therapy Treatment Patient Details  Name: Whitney Galloway MRN: [id number] Date of Birth: Feb 14, 1929  Today's Date: 01/07/2012 Time: 4098-1191 Time Calculation (min): 35 min Visit#: 5  of 8   Re-eval: 01/17/12 Charges:  therex 32'    Subjective: Symptoms/Limitations Symptoms: No pain today.  States she is having allergies though; Did yard work all weekend and states she sat on the ground to pull her weeds, crawling around to get them all.  Reports she had difficulty getting off the ground back to her feet afterwards.  Pt. reports she is sore from doing this but has no pain. Pain Assessment Currently in Pain?: No/denies  Exercises Instructed by Trilby Leaver, SPTA under the direction of Ellesse Antenucci Bascom Levels, PTA/CI. Exercise/Treatments Stretches Active Hamstring Stretch: 3 reps;30 seconds Single Knee to Chest Stretch: 3 reps;30 seconds Lower Trunk Rotation: 5 reps;10 seconds Stability Bridge: 10 reps;Limitations Bridge Limitations: trunk instablity Bent Knee Raise: 10 reps AB Set Limitations: pelvic floor x 10 Straight Leg Raise: 10 reps;Limitations Straight Leg Raises Limitations: 5 reps L due to pain Functional Squats: 15 reps Forward Lunge: 10 reps;Limitations Forward Lunge Limitations: bilateral Machine Exercises Tread Mill: 1. X 5 minutes     Physical Therapy Assessment and Plan PT Assessment and Plan Clinical Impression Statement: Noted instability with bridging and unable to complete SLR on L LE due to proximal LE discomfort.  Pt. able to get into floor and sit on LE's without difficulty, some difficulty getting from floor to stand.  Recommended pt. get a gardening bench to do her yard work to increase safety.  Also recommended pt. begin a walking program to increase her endurance.  Pt. agreeable.  Began treadmill with noted fatigue at 5 minutes.  Will try to progress treadmill 1-2 minutes each session. PT Plan: Continue to progress, increase time on treadmill  each visit.     PT - End of Session Activity Tolerance: Patient tolerated treatment well General Behavior During Session: [facility] for tasks performed Cognition: [facility] for tasks performed    Jerson Furukawa B. Bascom Levels, PTA 01/07/2012, 3:18 PM

## 2012-01-09 ENCOUNTER — Ambulatory Visit (HOSPITAL_COMMUNITY)
Admission: RE | Admit: 2012-01-09 | Discharge: 2012-01-09 | Disposition: A | Discharge status: Home / Self Care | Payer: Medicare Other | Source: Ambulatory Visit | Attending: Internal Medicine | Admitting: Internal Medicine

## 2012-01-09 ENCOUNTER — Ambulatory Visit (HOSPITAL_COMMUNITY): Payer: Medicare Other | Admitting: Physical Therapy

## 2012-01-09 DIAGNOSIS — M545 Low back pain, unspecified: Secondary | ICD-10-CM | POA: Diagnosis not present

## 2012-01-09 DIAGNOSIS — R29898 Other symptoms and signs involving the musculoskeletal system: Secondary | ICD-10-CM

## 2012-01-09 DIAGNOSIS — M2560 Stiffness of unspecified joint, not elsewhere classified: Secondary | ICD-10-CM

## 2012-01-09 DIAGNOSIS — R262 Difficulty in walking, not elsewhere classified: Secondary | ICD-10-CM

## 2012-01-09 NOTE — Progress Notes (Signed)
Physical Therapy Treatment Patient Details  Name: Whitney Galloway MRN: [id number] Date of Birth: March 29, 1929  Today's Date: 01/09/2012 Time: 7829-5621 Time Calculation (min): 52 min Visit#: 6  of 8   Re-eval: 01/17/12  Charge: TE 45 min  Subjective: Symptoms/Limitations Symptoms: I was on feet a lot yesterday, back hurt with radiating pain in both LE groin down anterior thighs.  No pain today Pain Assessment Currently in Pain?: No/denies  Objective:   Exercise/Treatments Stretches Active Hamstring Stretch: 3 reps;30 seconds Lower Trunk Rotation: 5 reps;10 seconds Stability Clam: 10 reps;Limitations;Side-lying Clam Limitations: 10" holds with max cueing for glut med contraction Bridge: 15 reps;Limitations Bridge Limitations: trunk instablity Bent Knee Raise: 10 reps AB Set Limitations: pelvic floor x 10 Straight Leg Raise: 10 reps;Limitations Straight Leg Raises Limitations: 5 reps L due to pain Hip Abduction: Limitations;10 reps Hip Abduction Limitations: adduction with orange ball 10 x5 " Functional Squats: 15 reps Forward Lunge: 10 reps;Limitations Forward Lunge Limitations: bilateral Machine Exercises Tread Mill: 1.8 x 7 min     Physical Therapy Assessment and Plan PT Assessment and Plan Clinical Impression Statement: Pt able to increase time on treadmill at beginning of session for endurance with min cueing for posture, pt able to complete with noted fatigue.  Noted instability with bridges and bent knee raises, cueing required for correct musculature and control.  Pt able to complete gentrle slow TrA contractions with cueing to reduce substitution with posterior pelvic tilts and vcing to  breath.  Pt unable to complete SLR L LE due to pain.  PT Plan: Continue progressing per POC, increase time/speed on treadmill, begin balance and prone exercises as tolerable.    Goals    Problem List Patient Active Problem List  Diagnoses  . COLONIC POLYPS, ADENOMATOUS  .  UNSPECIFIED DISEASE OF SPLEEN  . DEPRESSION/ANXIETY  . DECREASED HEARING  . HYPERTENSION  . ALLERGIC RHINITIS, SEASONAL  . GASTROESOPHAGEAL REFLUX DISEASE, CHRONIC  . GASTRITIS  . DUODENITIS  . GASTROPARESIS  . HIATAL HERNIA  . DIVERTICULOSIS OF COLON  . CONSTIPATION, CHRONIC  . IRRITABLE BOWEL SYNDROME  . RECTAL BLEEDING  . ECZEMA  . OSTEOARTHRITIS, SPINE  . DEGENERATIVE DISC DISEASE  . NAUSEA AND VOMITING  . OTHER DYSPHAGIA  . ABDOMINAL BLOATING  . FECAL INCONTINENCE  . ABNORMAL FECES  . DIARRHEA, BLOODY  . ABDOMINAL PAIN, LOWER  . PUD, HX OF  . TOBACCO USE, QUIT  . Stiffness of joints, not elsewhere classified, multiple sites  . Weakness of left leg  . Difficulty in walking  . Night sweats    PT - End of Session Activity Tolerance: Patient tolerated treatment well General Behavior During Session: [facility] for tasks performed Cognition: [facility] for tasks performed  GP No functional reporting required  Juel Burrow, PTA 01/09/2012, 12:02 PM

## 2012-01-14 ENCOUNTER — Ambulatory Visit (HOSPITAL_COMMUNITY)
Admission: RE | Admit: 2012-01-14 | Discharge: 2012-01-14 | Disposition: A | Discharge status: Home / Self Care | Payer: Medicare Other | Source: Ambulatory Visit | Attending: Internal Medicine | Admitting: Internal Medicine

## 2012-01-14 NOTE — Progress Notes (Signed)
Physical Therapy Treatment Patient Details  Name: Whitney Galloway MRN: [id number] Date of Birth: [birth year]  Today's Date: 01/14/2012 Time: 1430-1520 Time Calculation (min): 50 min Visit#: 7  of 8   Re-eval: 01/17/12 Charges: Therex x 45'  Subjective: Symptoms/Limitations Symptoms: Pt states that her doctor wants her to have a TENS unit. Pain Assessment Currently in Pain?: Yes Pain Score:   3 Pain Location: Back Pain Orientation: Lower   Exercise/Treatments Stability Bridge: 15 reps;Limitations Bridge Limitations: trunk instablity Bent Knee Raise: 10 reps Straight Leg Raise: 10 reps Hip Abduction: Limitations;10 reps Hip Abduction Limitations: adduction with orange ball 10 x5 " Heel Squeeze: Limitations Heel Squeeze Limitations: Attempted but unable to complete prone glute sets done instead 10x5" Leg Raise: 10 reps;Prone Functional Squats: 15 reps Machine Exercises Tread Mill: 1.6 x 8 min to postural strength and endurance    Physical Therapy Assessment and Plan PT Assessment and Plan Clinical Impression Statement: Pt tolerates time in increase to 8' on TM without difficulty. Heel squeezes were attempted but pt was unable to complete properly without compensating with lumbar mm. Began prone glute squeeze with partial contraction. Pt reports 0/10 pain at end of session. TENS unit request sent to MD. PT Plan: Reassess next session. Issue TENS if signed order is received from MD.     Problem List Patient Active Problem List  Diagnoses  . COLONIC POLYPS, ADENOMATOUS  . UNSPECIFIED DISEASE OF SPLEEN  . DEPRESSION/ANXIETY  . DECREASED HEARING  . HYPERTENSION  . ALLERGIC RHINITIS, SEASONAL  . GASTROESOPHAGEAL REFLUX DISEASE, CHRONIC  . GASTRITIS  . DUODENITIS  . GASTROPARESIS  . HIATAL HERNIA  . DIVERTICULOSIS OF COLON  . CONSTIPATION, CHRONIC  . IRRITABLE BOWEL SYNDROME  . RECTAL BLEEDING  . ECZEMA  . OSTEOARTHRITIS, SPINE  . DEGENERATIVE DISC DISEASE  .  NAUSEA AND VOMITING  . OTHER DYSPHAGIA  . ABDOMINAL BLOATING  . FECAL INCONTINENCE  . ABNORMAL FECES  . DIARRHEA, BLOODY  . ABDOMINAL PAIN, LOWER  . PUD, HX OF  . TOBACCO USE, QUIT  . Stiffness of joints, not elsewhere classified, multiple sites  . Weakness of left leg  . Difficulty in walking  . Night sweats    PT - End of Session Activity Tolerance: Patient tolerated treatment well General Behavior During Session: [facility] for tasks performed Cognition: [facility] for tasks performed  Seth Bake, PTA 01/14/2012, 3:33 PM

## 2012-01-16 ENCOUNTER — Telehealth (HOSPITAL_COMMUNITY): Payer: Self-pay | Admitting: Physical Therapy

## 2012-01-16 ENCOUNTER — Ambulatory Visit (HOSPITAL_COMMUNITY)
Admission: RE | Admit: 2012-01-16 | Discharge: 2012-01-16 | Disposition: A | Discharge status: Home / Self Care | Payer: Medicare Other | Source: Ambulatory Visit | Attending: Anesthesiology | Admitting: Anesthesiology

## 2012-01-16 DIAGNOSIS — R29898 Other symptoms and signs involving the musculoskeletal system: Secondary | ICD-10-CM

## 2012-01-16 DIAGNOSIS — R262 Difficulty in walking, not elsewhere classified: Secondary | ICD-10-CM

## 2012-01-16 DIAGNOSIS — M2560 Stiffness of unspecified joint, not elsewhere classified: Secondary | ICD-10-CM

## 2012-01-16 NOTE — Evaluation (Deleted)
Physical Therapy Evaluation  Patient Details  Name: Whitney Galloway MRN: [id number] Date of Birth: Dec 10, 1928  Today's Date: 01/16/2012 Time: 1430-1520 Time Calculation (min): 50 min  Visit#: 8  of 8   Re-eval: 02/15/12 Assessment Diagnosis: spinal stenosis Next MD Visit: 01/13/12  Past Medical History:  Past Medical History  Diagnosis Date  . GERD (gastroesophageal reflux disease)   . HTN (hypertension)   . IBS (irritable bowel syndrome)   . Hernia of unspecified site of abdominal cavity without mention of obstruction or gangrene   . Hiatal hernia   . Diverticulosis   . Gastritis   . Splenic lesion HAMARTOMA  . Hard of hearing   . Colon polyp 1994 Malignant polyp    2005 NL BE; 2006 incomplete TCS DUE TO REDUNDNACY  . Fibromyalgia   . TB (tuberculosis) AGE 73  . Osteoporosis   . S/P endoscopy 2007, 2009    2007: single distal esophageal erosion, s/p 40 F dilation, no web/ring/stricture noted; 2009: normal, Bravo with adequate suppression on Prevacid BID  . Gastroparesis 2009    65% RETENTION AT 2 HOURS   Past Surgical History:  Past Surgical History  Procedure Date  . Upper gastrointestinal endoscopy 2006  . Benign tumor removed from bilateral breast   . Colonoscopy w/ polypectomy   . Mass removed from roof of mouth   . Cataract surgery   . Lung surgery for pulmonary nodules   . Left wrist cyst removal   . X-stop implantation   . Esophagogastroduodenoscopy 08/27/2011    Procedure: ESOPHAGOGASTRODUODENOSCOPY (EGD);  Surgeon: Arlyce Harman, MD;  [facility];  Service: Endoscopy;  Laterality: N/A;  11:30  . Savory dilation 08/27/2011    Procedure: SAVORY DILATION;  Surgeon: Arlyce Harman, MD;  Location: [facility];  Service: Endoscopy;  Laterality: N/A;    Subjective Symptoms/Limitations Symptoms: The patient states the exercises she has been doing has helped her neck a lot.  She also has noticed her legs are stronger. How long can you stand  comfortably?: The patient states that she can stand 15-20 minutes now at eval the patient was saying 10-15 minutes. How long can you walk comfortably?: Without holding on to something the patient states that she can walk 10 minutes was 5 minutes. Pain Assessment Currently in Pain?: Yes Pain Score: 0-No pain (worst pain is the back and down L leg to knee 5/10) Pain Location: Back Pain Orientation: Lower Pain Relieving Factors: exercises  Precautions/Restrictions     Prior Functioning  Prior Function Leisure: Hobbies-yes (Comment) Comments:  (yard work)    Assessment RLE Strength Right Hip Flexion: 5/5 Right Hip Extension: 5/5 Right Hip ABduction: 5/5 Right Knee Flexion: 5/5 Right Knee Extension: 5/5 Right Ankle Dorsiflexion: 3+/5 LLE Strength Left Hip Flexion: 5/5 (was 2-/5) Left Hip Extension: 3+/5 (was 3-/5) Left Hip ABduction: 4/5 (was 3-/5) Left Hip ADduction: 5/5 (was 3/5) Left Knee Flexion: 4/5 (was 3-/5) Left Knee Extension: 5/5 Left Ankle Dorsiflexion: 4/5 (was 3/5) Lumbar AROM Lumbar Extension: decreased 25% was decreased 50% Lumbar - Left Side Bend: decreased 50% was decreased 100% Lumbar - Right Rotation: wfl  Exercise/Treatments Mobility/Balance  Ambulation/Gait Gait Pattern:  (Gait pattern is less antalgic.) Posture/Postural Control Posture/Postural Control:  (Pt is able to stand more erect.)   Pt did not complete ex this session she was MMT, ROM teseted; explained and given a TENS unit.   Physical Therapy Assessment and Plan PT Assessment and Plan Clinical Impression Statement: Pt given  and instructed in TENS unit.  Pt has made gains but still has strength and Pain limitations.  Pt will benefit from continuted therapy services to work on remaining goals. PT Frequency: Min 2X/week PT Duration: 4 weeks PT Treatment/Interventions: Gait training;Therapeutic activities;Therapeutic exercise;Patient/family education    Goals Home Exercise Program Pt  will Perform Home Exercise Program: Independently PT Short Term Goals PT Short Term Goal 1 - Progress: Not met PT Long Term Goals PT Long Term Goal 1 - Progress: Met PT Long Term Goal 2 - Progress: Progressing toward goal Long Term Goal 3 Progress: Not met Long Term Goal 4 Progress: Not met PT Long Term Goal 5: LE strength to be 4+/5  Problem List Patient Active Problem List  Diagnoses  . COLONIC POLYPS, ADENOMATOUS  . UNSPECIFIED DISEASE OF SPLEEN  . DEPRESSION/ANXIETY  . DECREASED HEARING  . HYPERTENSION  . ALLERGIC RHINITIS, SEASONAL  . GASTROESOPHAGEAL REFLUX DISEASE, CHRONIC  . GASTRITIS  . DUODENITIS  . GASTROPARESIS  . HIATAL HERNIA  . DIVERTICULOSIS OF COLON  . CONSTIPATION, CHRONIC  . IRRITABLE BOWEL SYNDROME  . RECTAL BLEEDING  . ECZEMA  . OSTEOARTHRITIS, SPINE  . DEGENERATIVE DISC DISEASE  . NAUSEA AND VOMITING  . OTHER DYSPHAGIA  . ABDOMINAL BLOATING  . FECAL INCONTINENCE  . ABNORMAL FECES  . DIARRHEA, BLOODY  . ABDOMINAL PAIN, LOWER  . PUD, HX OF  . TOBACCO USE, QUIT  . Stiffness of joints, not elsewhere classified, multiple sites  . Weakness of left leg  . Difficulty in walking  . Night sweats    PT - End of Session Activity Tolerance: Patient tolerated treatment well General Behavior During Session: [facility] for tasks performed Cognition: [facility] for tasks performed  GP  Functional Reporting Modifier  Current Status  [phone] - Mobility: Walking & Moving Around CJ - At least 20% but less than 40% impaired, limited or restricted  Goal Status  G8979 - Mobility: [place] & Moving Around CI - At least 1% but less than 20% impaired, limited or restricted   Gyanna Jarema,CINDY 01/16/2012, 3:51 PM  Physician Documentation Your signature is required to indicate approval of the treatment plan as stated above.  Please sign and either send electronically or make a copy of this report for your files and return this physician signed original.   Please mark one 1.__approve  of plan  2. ___approve of plan with the following conditions.   ______________________________                                                          _____________________ Physician Signature                                                                                                             Date

## 2012-01-16 NOTE — Evaluation (Addendum)
Physical Therapy Evaluation  Patient Details  Name: Whitney Galloway MRN: [id number] Date of Birth: 01-01-1929  Today's Date: 01/16/2012 Time: 1430-1520 Time Calculation (min): 50 min  Visit#: 8  of 16   Re-eval: 02/15/12 Assessment Diagnosis: spinal stenosis Next MD Visit: 01/13/12  Past Medical History:  Past Medical History  Diagnosis Date  . GERD (gastroesophageal reflux disease)   . HTN (hypertension)   . IBS (irritable bowel syndrome)   . Hernia of unspecified site of abdominal cavity without mention of obstruction or gangrene   . Hiatal hernia   . Diverticulosis   . Gastritis   . Splenic lesion HAMARTOMA  . Hard of hearing   . Colon polyp 1994 Malignant polyp    2005 NL BE; 2006 incomplete TCS DUE TO REDUNDNACY  . Fibromyalgia   . TB (tuberculosis) AGE 69  . Osteoporosis   . S/P endoscopy 2007, 2009    2007: single distal esophageal erosion, s/p 46 F dilation, no web/ring/stricture noted; 2009: normal, Bravo with adequate suppression on Prevacid BID  . Gastroparesis 2009    65% RETENTION AT 2 HOURS   Past Surgical History:  Past Surgical History  Procedure Date  . Upper gastrointestinal endoscopy 2006  . Benign tumor removed from bilateral breast   . Colonoscopy w/ polypectomy   . Mass removed from roof of mouth   . Cataract surgery   . Lung surgery for pulmonary nodules   . Left wrist cyst removal   . X-stop implantation   . Esophagogastroduodenoscopy 08/27/2011    Procedure: ESOPHAGOGASTRODUODENOSCOPY (EGD);  Surgeon: Arlyce Harman, MD;  [facility];  Service: Endoscopy;  Laterality: N/A;  11:30  . Savory dilation 08/27/2011    Procedure: SAVORY DILATION;  Surgeon: Arlyce Harman, MD;  [facility]: [facility];  Service: Endoscopy;  Laterality: N/A;    Subjective Symptoms/Limitations Symptoms: The patient states the exercises she has been doing has helped her neck a lot.  She also has noticed her legs are stronger. How long can you stand  comfortably?: The patient states that she can stand 15-20 minutes now at eval the patient was saying 10-15 minutes. How long can you walk comfortably?: Without holding on to something the patient states that she can walk five minutes which is where she wasl Pain Assessment Currently in Pain?: Yes Pain Score: 0-No pain (worst pain is the back and down L leg to knee 5/10) Pain Location: Back Pain Orientation: Lower Pain Relieving Factors: exercises   Prior Functioning  Prior Function Leisure: Hobbies-yes (Comment) Comments:  (yard work)   Assessment RLE Strength Right Hip Flexion: 5/5 Right Hip Extension: 5/5 Right Hip ABduction: 5/5 Right Knee Flexion: 5/5 Right Knee Extension: 5/5 Right Ankle Dorsiflexion: 3+/5 LLE Strength Left Hip Flexion: 5/5 (was 2-/5) Left Hip Extension: 3+/5 (was 3-/5) Left Hip ABduction: 4/5 (was 3-/5) Left Hip ADduction: 5/5 (was 3/5) Left Knee Flexion: 4/5 (was 3-/5) Left Knee Extension: 5/5 Left Ankle Dorsiflexion: 4/5 (was 3/5) Lumbar AROM Lumbar Extension: decreased 25% was decreased 50% Lumbar - Left Side Bend: decreased 50% was decreased 100% Lumbar - Right Rotation: wfl  Exercise/Treatments Mobility/Balance  Ambulation/Gait Gait Pattern:  (Gait pattern is less antalgic.) Posture/Postural Control Posture/Postural Control:  (Pt is able to stand more erect.)  Pt instructed in and given TENS unit this session.  No exercises were completed     Physical Therapy Assessment and Plan PT Assessment and Plan Clinical Impression Statement: Pt given and instructed in TENS unit.  Pt has made gains but still has strength and Pain limitations.  Pt will benefit from continuted therapy services to work on remaining goals. PT Frequency: Min 2X/week PT Duration: 4 weeks PT Treatment/Interventions: Gait training;Therapeutic activities;Therapeutic exercise;Patient/family education    Goals Home Exercise Program Pt will Perform Home Exercise Program:  Independently PT Short Term Goals PT Short Term Goal 1 - Progress: Not met PT Long Term Goals PT Long Term Goal 1 - Progress: Met PT Long Term Goal 2 - Progress: Progressing toward goal Long Term Goal 3 Progress: Not met Long Term Goal 4 Progress: Not met PT Long Term Goal 5: LE strength to be 4+/5  Problem List Patient Active Problem List  Diagnoses  . COLONIC POLYPS, ADENOMATOUS  . UNSPECIFIED DISEASE OF SPLEEN  . DEPRESSION/ANXIETY  . DECREASED HEARING  . HYPERTENSION  . ALLERGIC RHINITIS, SEASONAL  . GASTROESOPHAGEAL REFLUX DISEASE, CHRONIC  . GASTRITIS  . DUODENITIS  . GASTROPARESIS  . HIATAL HERNIA  . DIVERTICULOSIS OF COLON  . CONSTIPATION, CHRONIC  . IRRITABLE BOWEL SYNDROME  . RECTAL BLEEDING  . ECZEMA  . OSTEOARTHRITIS, SPINE  . DEGENERATIVE DISC DISEASE  . NAUSEA AND VOMITING  . OTHER DYSPHAGIA  . ABDOMINAL BLOATING  . FECAL INCONTINENCE  . ABNORMAL FECES  . DIARRHEA, BLOODY  . ABDOMINAL PAIN, LOWER  . PUD, HX OF  . TOBACCO USE, QUIT  . Stiffness of joints, not elsewhere classified, multiple sites  . Weakness of left leg  . Difficulty in walking  . Night sweats    PT - End of Session Activity Tolerance: Patient tolerated treatment well General Behavior During Session: [facility] for tasks performed Cognition: [facility] for tasks performed  GP TO be changed to the below on visit 10  Functional Reporting Modifier  Current Status  [phone] - Mobility: Walking & Moving Around CJ - At least 20% but less than 40% impaired, limited or restricted  Goal Status  G8979 - Mobility: [place] & Moving Around CI - At least 1% but less than 20% impaired, limited or restricted   Hamsa Laurich,CINDY 01/16/2012, 3:55 PM  Physician Documentation Your signature is required to indicate approval of the treatment plan as stated above.  Please sign and either send electronically or make a copy of this report for your files and return this physician signed original.   Please mark one  1.__approve of plan  2. ___approve of plan with the following conditions.   ______________________________                                                          _____________________ Physician Signature                                                                                                             Date

## 2012-01-17 DIAGNOSIS — R0602 Shortness of breath: Secondary | ICD-10-CM | POA: Diagnosis not present

## 2012-01-17 DIAGNOSIS — R232 Flushing: Secondary | ICD-10-CM | POA: Diagnosis not present

## 2012-01-17 DIAGNOSIS — F410 Panic disorder [episodic paroxysmal anxiety] without agoraphobia: Secondary | ICD-10-CM | POA: Diagnosis not present

## 2012-01-20 ENCOUNTER — Other Ambulatory Visit: Payer: Self-pay

## 2012-01-20 DIAGNOSIS — R0602 Shortness of breath: Secondary | ICD-10-CM

## 2012-01-21 ENCOUNTER — Ambulatory Visit (HOSPITAL_COMMUNITY): Payer: Medicare Other

## 2012-01-22 ENCOUNTER — Ambulatory Visit (HOSPITAL_COMMUNITY): Payer: Medicare Other

## 2012-01-23 ENCOUNTER — Ambulatory Visit (HOSPITAL_COMMUNITY)
Admission: RE | Admit: 2012-01-23 | Discharge: 2012-01-23 | Disposition: A | Discharge status: Home / Self Care | Payer: Medicare Other | Source: Ambulatory Visit | Attending: Internal Medicine | Admitting: Internal Medicine

## 2012-01-23 DIAGNOSIS — H04129 Dry eye syndrome of unspecified lacrimal gland: Secondary | ICD-10-CM | POA: Diagnosis not present

## 2012-01-23 DIAGNOSIS — H353190 Nonexudative age-related macular degeneration, unspecified eye, stage unspecified: Secondary | ICD-10-CM | POA: Diagnosis not present

## 2012-01-23 DIAGNOSIS — H35369 Drusen (degenerative) of macula, unspecified eye: Secondary | ICD-10-CM | POA: Diagnosis not present

## 2012-01-23 DIAGNOSIS — H5200 Hypermetropia, unspecified eye: Secondary | ICD-10-CM | POA: Diagnosis not present

## 2012-01-23 NOTE — Progress Notes (Signed)
Physical Therapy Treatment Patient Details  Name: Whitney Galloway MRN: [id number] Date of Birth: July 10, 1929  Today's Date: 01/23/2012 Time: 4098-1191 Time Calculation (min): 41 min Visit#: 9  of 16   Re-eval: 02/15/12 Charges: Therex x 15' Self care x 23'  Subjective: Symptoms/Limitations Symptoms: Pt reports that she would like to be educated again on TENS unit. Pain Assessment Currently in Pain?: Yes Pain Score:   4 (With walking) Pain Location: Back Pain Orientation: Lower Pain Radiating Towards: L hip to thigh   Exercise/Treatments Standing Functional Squats: 10 reps Supine Bridge: 15 reps;Limitations Straight Leg Raise: 10 reps Straight Leg Raises Limitations: R only. Pt unable to lift L leg  Physical Therapy Assessment and Plan PT Assessment and Plan Clinical Impression Statement: Majority of tx spent on TENS education. Pt appears to have better understanding of TENS use at end of session. Pt's daughter present for TENS education. Pt with increased difficulty with supine LE exercises secondary to L LE weakness. Pt attributes this to doing a lot of walking before coming to therapy. Pt presents with improved form with functional squats this session. Pt states no change in pain at end of session. PT Plan: Continue to progress per PT POC.     Problem List Patient Active Problem List  Diagnoses  . COLONIC POLYPS, ADENOMATOUS  . UNSPECIFIED DISEASE OF SPLEEN  . DEPRESSION/ANXIETY  . DECREASED HEARING  . HYPERTENSION  . ALLERGIC RHINITIS, SEASONAL  . GASTROESOPHAGEAL REFLUX DISEASE, CHRONIC  . GASTRITIS  . DUODENITIS  . GASTROPARESIS  . HIATAL HERNIA  . DIVERTICULOSIS OF COLON  . CONSTIPATION, CHRONIC  . IRRITABLE BOWEL SYNDROME  . RECTAL BLEEDING  . ECZEMA  . OSTEOARTHRITIS, SPINE  . DEGENERATIVE DISC DISEASE  . NAUSEA AND VOMITING  . OTHER DYSPHAGIA  . ABDOMINAL BLOATING  . FECAL INCONTINENCE  . ABNORMAL FECES  . DIARRHEA, BLOODY  . ABDOMINAL PAIN,  LOWER  . PUD, HX OF  . TOBACCO USE, QUIT  . Stiffness of joints, not elsewhere classified, multiple sites  . Weakness of left leg  . Difficulty in walking  . Night sweats    PT - End of Session Activity Tolerance: Patient tolerated treatment well General Behavior During Session: [facility] for tasks performed Cognition: [facility] for tasks performed  Seth Bake, PTA 01/23/2012, 5:39 PM

## 2012-01-25 ENCOUNTER — Emergency Department (HOSPITAL_COMMUNITY)
Admission: EM | Admit: 2012-01-25 | Discharge: 2012-01-25 | Disposition: A | Discharge status: Home / Self Care | Payer: Medicare Other | Attending: Emergency Medicine | Admitting: Emergency Medicine

## 2012-01-25 ENCOUNTER — Encounter (HOSPITAL_COMMUNITY): Payer: Self-pay | Admitting: Emergency Medicine

## 2012-01-25 ENCOUNTER — Emergency Department (HOSPITAL_COMMUNITY): Payer: Medicare Other

## 2012-01-25 DIAGNOSIS — K219 Gastro-esophageal reflux disease without esophagitis: Secondary | ICD-10-CM | POA: Insufficient documentation

## 2012-01-25 DIAGNOSIS — I10 Essential (primary) hypertension: Secondary | ICD-10-CM | POA: Insufficient documentation

## 2012-01-25 DIAGNOSIS — Z9889 Other specified postprocedural states: Secondary | ICD-10-CM | POA: Diagnosis not present

## 2012-01-25 DIAGNOSIS — R0602 Shortness of breath: Secondary | ICD-10-CM | POA: Diagnosis not present

## 2012-01-25 DIAGNOSIS — Z8611 Personal history of tuberculosis: Secondary | ICD-10-CM | POA: Insufficient documentation

## 2012-01-25 DIAGNOSIS — R110 Nausea: Secondary | ICD-10-CM | POA: Insufficient documentation

## 2012-01-25 DIAGNOSIS — K589 Irritable bowel syndrome without diarrhea: Secondary | ICD-10-CM | POA: Diagnosis not present

## 2012-01-25 DIAGNOSIS — IMO0001 Reserved for inherently not codable concepts without codable children: Secondary | ICD-10-CM | POA: Insufficient documentation

## 2012-01-25 DIAGNOSIS — R6883 Chills (without fever): Secondary | ICD-10-CM | POA: Diagnosis not present

## 2012-01-25 DIAGNOSIS — R918 Other nonspecific abnormal finding of lung field: Secondary | ICD-10-CM | POA: Diagnosis not present

## 2012-01-25 LAB — COMPREHENSIVE METABOLIC PANEL
ALT: 15 U/L (ref 0–35)
Alkaline Phosphatase: 83 U/L (ref 39–117)
CO2: 27 mEq/L (ref 19–32)
Chloride: 104 mEq/L (ref 96–112)
GFR calc Af Amer: >90 mL/min (ref >90)
GFR calc non Af Amer: 78 mL/min — ABNORMAL LOW (ref >90)
Glucose, Bld: 106 mg/dL — ABNORMAL HIGH (ref 70–99)
Potassium: 3.8 mEq/L (ref 3.5–5.1)
Sodium: 139 mEq/L (ref 135–145)
Total Bilirubin: 0.3 mg/dL (ref 0.3–1.2)

## 2012-01-25 LAB — DIFFERENTIAL
Lymphocytes Relative: 28 % (ref 12–46)
Lymphs Abs: 1.5 10*3/uL (ref 0.7–4.0)
Monocytes Relative: 6 % (ref 3–12)
Neutrophils Relative %: 64 % (ref 43–77)

## 2012-01-25 LAB — CBC
Hemoglobin: 12.2 g/dL (ref 12.0–15.0)
Platelets: 214 10*3/uL (ref 150–400)
RBC: 4.39 MIL/uL (ref 3.87–5.11)
WBC: 5.3 10*3/uL (ref 4.0–10.5)

## 2012-01-25 MED ORDER — ONDANSETRON 4 MG PO TBDP: ORAL_TABLET | ORAL | Status: DC

## 2012-01-25 NOTE — ED Notes (Signed)
Pt states she just doesn't feel well.  Pt states that she has nausea and SOB since yesterday.

## 2012-01-25 NOTE — ED Notes (Signed)
Pt in NAD

## 2012-01-25 NOTE — ED Provider Notes (Cosign Needed)
History   This chart was scribed for Whitney Lennert, MD by Melba Coon. The patient was seen in room APA14/AP[facility]14 and the patient's care was started at 5:06PM.    CSN: [id number]  Arrival date & time 01/25/12  1522   First MD Initiated Contact with Patient 01/25/12 1700      Chief Complaint  Patient presents with  . Shortness of Breath  . Nausea    (Consider location/radiation/quality/duration/timing/severity/associated sxs/prior treatment) Patient is a 78 y.o. female presenting with shortness of breath. The history is provided by the patient.  Shortness of Breath  The current episode started yesterday. The problem occurs occasionally. The problem has been gradually improving. The problem is mild. The symptoms are relieved by nothing. The symptoms are aggravated by nothing. Associated symptoms include shortness of breath. Associated symptoms comments: nausea. There was no intake of a foreign body. She has had no prior steroid use.  Pt has been taking new medications, but may not be related to current episode. Pt is getting lung diagnostic done next week; just had recent lung surgery by Dr. Sharilyn Sites. Originally thought it was lung CA, but pt states later investigation revealed pt had inhaled a poisonous mushroom that had crystallized in the pt lungs. Pt states that she used to kick around mushrooms in her backyard while gardening. Generalized weakness present. No generalized pain present. No known allergies. No other pertinent medical symptoms.  PCP: Dr. Maryla Morrow   Past Medical History  Diagnosis Date  . GERD (gastroesophageal reflux disease)   . HTN (hypertension)   . IBS (irritable bowel syndrome)   . Hernia of unspecified site of abdominal cavity without mention of obstruction or gangrene   . Hiatal hernia   . Diverticulosis   . Gastritis   . Splenic lesion HAMARTOMA  . Hard of hearing   . Colon polyp 1994 Malignant polyp    2005 NL BE; 2006 incomplete TCS DUE TO REDUNDNACY  .  Fibromyalgia   . TB (tuberculosis) AGE 24  . Osteoporosis   . S/P endoscopy 2007, 2009    2007: single distal esophageal erosion, s/p 19 F dilation, no web/ring/stricture noted; 2009: normal, Bravo with adequate suppression on Prevacid BID  . Gastroparesis 2009    65% RETENTION AT 2 HOURS    Past Surgical History  Procedure Date  . Upper gastrointestinal endoscopy 2006  . Benign tumor removed from bilateral breast   . Colonoscopy w/ polypectomy   . Mass removed from roof of mouth   . Cataract surgery   . Lung surgery for pulmonary nodules   . Left wrist cyst removal   . X-stop implantation   . Esophagogastroduodenoscopy 08/27/2011    Procedure: ESOPHAGOGASTRODUODENOSCOPY (EGD);  Surgeon: Arlyce Harman, MD;  Location: [facility];  Service: Endoscopy;  Laterality: N/A;  11:30  . Savory dilation 08/27/2011    Procedure: SAVORY DILATION;  Surgeon: Arlyce Harman, MD;  Location: [facility];  Service: Endoscopy;  Laterality: N/A;    No family history on file.  History  Substance Use Topics  . Smoking status: Never Smoker   . Smokeless tobacco: Never Used  . Alcohol Use: No    OB History    Grav Para Term Preterm Abortions TAB SAB Ect Mult Living                  Review of Systems  Constitutional: Positive for chills (Physical jerking).  HENT: Positive for sinus pressure.   Eyes: Negative.  Respiratory: Positive for shortness of breath.   Cardiovascular: Negative.   Gastrointestinal: Positive for nausea.  Genitourinary: Negative.   Musculoskeletal: Negative.   Skin: Negative.   Neurological: Negative.   Hematological: Negative.   Psychiatric/Behavioral: Negative.    10 Systems reviewed and all are negative for acute change except as noted in the HPI.   Allergies  Review of patient's allergies indicates no known allergies.  Home Medications   Current Outpatient Rx  Name Route Sig Dispense Refill  . ALPRAZOLAM 0.5 MG PO TABS Oral Take 0.25 mg by mouth 2  (two) times daily.    [name] AMLODIPINE-ATORVASTATIN 10-20 MG PO TABS Oral Take 1 tablet by mouth daily.    [name] CALCIUM CARBONATE ANTACID 500 MG PO CHEW Oral Chew 1 tablet by mouth as needed. gas    . DICYCLOMINE HCL 10 MG PO CAPS Oral Take 10 mg by mouth as needed. Stomach pain    . OMEGA-3 FATTY ACIDS 1000 MG PO CAPS Oral Take 1 g by mouth daily.      [name] METOPROLOL SUCCINATE ER 25 MG PO TB24 Oral Take 25 mg by mouth 2 (two) times daily.     [name] ONE-DAILY MULTI VITAMINS PO TABS Oral Take 1 tablet by mouth daily.      [name] NABUMETONE 750 MG PO TABS Oral Take 750 mg by mouth 2 (two) times daily.      [name] OMEPRAZOLE 20 MG PO CPDR Oral Take 20 mg by mouth 3 (three) times daily. 1 po bid 30 MINUTES PRIOR TO YOUR MEALS    . VENLAFAXINE HCL ER 150 MG PO CP24 Oral Take 150 mg by mouth daily.      BP 119/56  Pulse 56  Temp(Src) 98.4 F (36.9 C) (Oral)  Resp 20  Ht 5\' 8"  (1.727 m)  Wt 140 lb (63.504 kg)  BMI 21.29 kg/m2  SpO2 96%  Physical Exam  Nursing note and vitals reviewed. Constitutional: She is oriented to person, place, and time. She appears well-developed and well-nourished.       Awake, alert, nontoxic appearance.  HENT:  Head: Normocephalic and atraumatic.  Eyes: EOM are normal. Pupils are equal, round, and reactive to light. Right eye exhibits no discharge. Left eye exhibits no discharge.  Neck: Normal range of motion. Neck supple.  Cardiovascular: Normal rate, regular rhythm and normal heart sounds.  Exam reveals no gallop and no friction rub.   No murmur heard. Pulmonary/Chest: Effort normal. She has no wheezes. She has no rales. She exhibits no tenderness.  Abdominal: Soft. There is no tenderness. There is no rebound.  Musculoskeletal: She exhibits no edema and no tenderness.       Baseline ROM, no obvious new focal weakness.  Neurological: She is alert and oriented to person, place, and time.       Mental status and motor strength appears baseline for patient and situation.  Skin: Skin  is warm. No rash noted.  Psychiatric: She has a normal mood and affect. Her behavior is normal.    ED Course  Procedures (including critical care time)  DIAGNOSTIC STUDIES: Oxygen Saturation is 98% on room air, normal by my interpretation.    COORDINATION OF CARE:  5:12PM - EDMD will order CXR and blood w/u for the pt. 6:54PM - recheck; pt is feeling much better; ready for d/c   Labs Reviewed  COMPREHENSIVE METABOLIC PANEL - Abnormal; Notable for the following:    Glucose, Bld 106 (*)    GFR calc non  Af Amer 78 (*)    All other components within normal limits  CBC  DIFFERENTIAL   Dg Chest 2 View  01/25/2012  *RADIOLOGY REPORT*  Clinical Data: Shortness of breath, prior lung surgery  CHEST - 2 VIEW  Comparison: 06/26/2010  Findings: Postsurgical changes in the right mid lung with associated volume loss.  Chronic interstitial markings/emphysematous changes. No pleural effusion or pneumothorax.  Cardiomediastinal silhouette is within normal limits.  Mild degenerative changes of the visualized thoracolumbar spine.  IMPRESSION: No evidence of acute cardiopulmonary disease.  Postsurgical changes in the right hemithorax.  Chronic interstitial markings/emphysematous changes.  Original Report Authenticated By: Charline Bills, M.D.     No diagnosis found.    MDM  nauseau improved  The chart was scribed for me under my direct supervision.  I personally performed the history, physical, and medical decision making and all procedures in the evaluation of this patient.Whitney Lennert, MD 01/25/12 1903  Whitney Lennert, MD 02/07/12 [phone]

## 2012-01-25 NOTE — Discharge Instructions (Signed)
Follow up with your md next week. °

## 2012-01-25 NOTE — ED Notes (Signed)
Waiting on daughter to come back

## 2012-01-28 ENCOUNTER — Ambulatory Visit (HOSPITAL_COMMUNITY)
Admission: RE | Admit: 2012-01-28 | Discharge: 2012-01-28 | Disposition: A | Discharge status: Home / Self Care | Payer: Medicare Other | Source: Ambulatory Visit | Attending: Physical Therapy | Admitting: Physical Therapy

## 2012-01-28 NOTE — Progress Notes (Signed)
Physical Therapy Treatment Patient Details  Name: Whitney Galloway MRN: [id number] Date of Birth: Mar 13, 1929  Today's Date: 01/28/2012 Time: 4098-1191 Time Calculation (min): 43 min Visit#: 10  of 16   Re-eval: 02/15/12 Charges: Therex x 30' Self care x 10'  Subjective: Symptoms/Limitations Symptoms: Pt requests help applying TENS unit. Pain Assessment Currently in Pain?: Yes Pain Score:   5 (With walking) Pain Location: Back Pain Orientation: Lower Pain Radiating Towards: L hip to thigh   Exercise/Treatments Standing Functional Squats: 15 reps Supine Bent Knee Raise: 10 reps Bridge: 15 reps Straight Leg Raise: 10 reps Straight Leg Raises Limitations: Bilateral Sidelying Clam: 5 reps;Limitations Clam Limitations: 10" holds Hip Abduction: 10 reps Prone  Other Prone Lumbar Exercises: Heel 10x5" Other Prone Lumbar Exercises: POE x 1'  Physical Therapy Assessment and Plan PT Assessment and Plan Clinical Impression Statement: Pt able to apply TENS unit with minimal assistance from therapist. Pt states she believes she will be able to put it on independently at home. Began prone exercises. Pt appears to receive some relief with POE. Pt reports pain decrease with walking to 4/10 at end of session. PT Plan: Continue to progress per PT POC.     Problem List Patient Active Problem List  Diagnoses  . COLONIC POLYPS, ADENOMATOUS  . UNSPECIFIED DISEASE OF SPLEEN  . DEPRESSION/ANXIETY  . DECREASED HEARING  . HYPERTENSION  . ALLERGIC RHINITIS, SEASONAL  . GASTROESOPHAGEAL REFLUX DISEASE, CHRONIC  . GASTRITIS  . DUODENITIS  . GASTROPARESIS  . HIATAL HERNIA  . DIVERTICULOSIS OF COLON  . CONSTIPATION, CHRONIC  . IRRITABLE BOWEL SYNDROME  . RECTAL BLEEDING  . ECZEMA  . OSTEOARTHRITIS, SPINE  . DEGENERATIVE DISC DISEASE  . NAUSEA AND VOMITING  . OTHER DYSPHAGIA  . ABDOMINAL BLOATING  . FECAL INCONTINENCE  . ABNORMAL FECES  . DIARRHEA, BLOODY  . ABDOMINAL PAIN, LOWER    . PUD, HX OF  . TOBACCO USE, QUIT  . Stiffness of joints, not elsewhere classified, multiple sites  . Weakness of left leg  . Difficulty in walking  . Night sweats    PT - End of Session Activity Tolerance: Patient tolerated treatment well General Behavior During Session: [name] for tasks performed Cognition: [name] for tasks performed  Seth Bake, PTA 01/28/2012, 1:50 PM

## 2012-01-29 ENCOUNTER — Ambulatory Visit (HOSPITAL_COMMUNITY)
Admission: RE | Admit: 2012-01-29 | Discharge: 2012-01-29 | Disposition: A | Discharge status: Home / Self Care | Payer: Medicare Other | Source: Ambulatory Visit | Attending: Internal Medicine | Admitting: Internal Medicine

## 2012-01-29 DIAGNOSIS — R0602 Shortness of breath: Secondary | ICD-10-CM | POA: Insufficient documentation

## 2012-01-29 MED ORDER — ALBUTEROL SULFATE (5 MG/ML) 0.5% IN NEBU
2.5000 mg | INHALATION_SOLUTION | Freq: Once | RESPIRATORY_TRACT | Status: AC
  Administered 2012-01-29: 2.5 mg via RESPIRATORY_TRACT

## 2012-01-30 ENCOUNTER — Ambulatory Visit (HOSPITAL_COMMUNITY)
Admission: RE | Admit: 2012-01-30 | Discharge: 2012-01-30 | Disposition: A | Discharge status: Home / Self Care | Payer: Medicare Other | Source: Ambulatory Visit | Attending: Internal Medicine | Admitting: Internal Medicine

## 2012-01-30 NOTE — Progress Notes (Signed)
Physical Therapy Treatment Patient Details  Name: Whitney Galloway MRN: [id number] Date of Birth: 06/17/1929  Today's Date: 01/30/2012 Time: 1345 (Tx started by Emeline Gins, PTA [phone]))-1426 Time Calculation (min): 41 min Visit#: 11  of 16   Re-eval: 02/15/12 Charges: Therex x 38'  Subjective: Symptoms/Limitations Symptoms: Pt states that the TENS unit is helping her pain. Pain Assessment Currently in Pain?: Yes Pain Score:   3 (With walking) Pain Location: Back Pain Orientation: Lower Pain Radiating Towards: L hip to thigh   Exercise/Treatments Standing Functional Squats: 15 reps Supine Bent Knee Raise: 10 reps Bridge: 15 reps Straight Leg Raise: 10 reps Straight Leg Raises Limitations: Bilateral Sidelying Clam: 5 reps;Limitations Clam Limitations: 10" holds Hip Abduction: 10 reps Prone  Straight Leg Raise: 10 reps Other Prone Lumbar Exercises: Heel squeeze 10x5" Other Prone Lumbar Exercises: POE 2x1'  Physical Therapy Assessment and Plan PT Assessment and Plan Clinical Impression Statement: Pt ambulates into therapy with TENS unit on and with proper pad placement. Pt completes all therex well with minimal need for cueing. Pt appears to have improved ease with bed mobility. PT Plan: Continue to progress per PT POC.     Problem List Patient Active Problem List  Diagnoses  . COLONIC POLYPS, ADENOMATOUS  . UNSPECIFIED DISEASE OF SPLEEN  . DEPRESSION/ANXIETY  . DECREASED HEARING  . HYPERTENSION  . ALLERGIC RHINITIS, SEASONAL  . GASTROESOPHAGEAL REFLUX DISEASE, CHRONIC  . GASTRITIS  . DUODENITIS  . GASTROPARESIS  . HIATAL HERNIA  . DIVERTICULOSIS OF COLON  . CONSTIPATION, CHRONIC  . IRRITABLE BOWEL SYNDROME  . RECTAL BLEEDING  . ECZEMA  . OSTEOARTHRITIS, SPINE  . DEGENERATIVE DISC DISEASE  . NAUSEA AND VOMITING  . OTHER DYSPHAGIA  . ABDOMINAL BLOATING  . FECAL INCONTINENCE  . ABNORMAL FECES  . DIARRHEA, BLOODY  . ABDOMINAL PAIN, LOWER  . PUD,  HX OF  . TOBACCO USE, QUIT  . Stiffness of joints, not elsewhere classified, multiple sites  . Weakness of left leg  . Difficulty in walking  . Night sweats    PT - End of Session Activity Tolerance: Patient tolerated treatment well General Behavior During Session: [facility] for tasks performed Cognition: [facility] for tasks performed   Seth Bake, PTA 01/30/2012, 2:33 PM

## 2012-01-30 NOTE — Procedures (Signed)
Whitney Galloway, Whitney Galloway             ACCOUNT NO.:  [id number]  MEDICAL RECORD NO.:  [id number]  LOCATION:                                FACILITY:  [facility]  PHYSICIAN:  Duane Earnshaw L. Juanetta Gosling, M.D.DATE OF BIRTH:  07/13/1929  DATE OF PROCEDURE:  01/30/2012 DATE OF DISCHARGE:                           PULMONARY FUNCTION TEST   Reason for pulmonary function testing is shortness of breath.  1. Spirometry shows a mild ventilatory defect with evidence of airflow     obstruction. 2. Lung volumes are normal. 3. DLCO is severely reduced, but corrects somewhat when volume is     accounted for. 4. Airway resistance is normal. 5. There was improvement with inhaled bronchodilator, but it does not     reach the level of significance.     [name] L. Juanetta Gosling, M.D.     ELH/MEDQ  D:  01/30/2012  T:  01/30/2012  Job:  [id number]

## 2012-02-04 ENCOUNTER — Ambulatory Visit (HOSPITAL_COMMUNITY)
Admission: RE | Admit: 2012-02-04 | Discharge: 2012-02-04 | Disposition: A | Discharge status: Home / Self Care | Payer: Medicare Other | Source: Ambulatory Visit | Attending: Internal Medicine | Admitting: Internal Medicine

## 2012-02-04 NOTE — Progress Notes (Signed)
Physical Therapy Treatment Patient Details  Name: Whitney Galloway MRN: [id number] Date of Birth: September 25, 1929  Today's Date: 02/04/2012 Time: 9629-5284 PT Time Calculation (min): 40 min Visit#: 12  of 16   Re-eval: 02/15/12 Charges: Therex x 38'   Subjective: Symptoms/Limitations Symptoms: Pt states that her pain is elevated today. She believes her shot is wearing off. Pain Assessment Currently in Pain?: Yes Pain Score:   4 Pain Location: Back Pain Orientation: Lower   Exercise/Treatments Stretches Active Hamstring Stretch: 3 reps;30 seconds Single Knee to Chest Stretch: 3 reps;30 seconds Lower Trunk Rotation: 5 reps;10 seconds Standing Functional Squats: 15 reps Supine Bent Knee Raise:  (unable secondary to pain) Bridge: 20 reps Straight Leg Raise:  (unable secondary to pain) Sidelying Clam: 10 reps Clam Limitations: 10" holds Hip Abduction: 15 reps Prone  Straight Leg Raise:  (unable secondary to pain) Other Prone Lumbar Exercises: Heel squeeze 15x5" Other Prone Lumbar Exercises: POE x2'  Physical Therapy Assessment and Plan PT Assessment and Plan Clinical Impression Statement: Pt unable to complete SLR(prone/supine) or bent knee raise secondary to L LE pain. Pt has no difficulty with SL exercises. End of session focused on stretching to decrease pain. Pt reports pain decrease to 3/10 at end of session. PT Plan: Continue to progress per PT POC.     Problem List Patient Active Problem List  Diagnoses  . COLONIC POLYPS, ADENOMATOUS  . UNSPECIFIED DISEASE OF SPLEEN  . DEPRESSION/ANXIETY  . DECREASED HEARING  . HYPERTENSION  . ALLERGIC RHINITIS, SEASONAL  . GASTROESOPHAGEAL REFLUX DISEASE, CHRONIC  . GASTRITIS  . DUODENITIS  . GASTROPARESIS  . HIATAL HERNIA  . DIVERTICULOSIS OF COLON  . CONSTIPATION, CHRONIC  . IRRITABLE BOWEL SYNDROME  . RECTAL BLEEDING  . ECZEMA  . OSTEOARTHRITIS, SPINE  . DEGENERATIVE DISC DISEASE  . NAUSEA AND VOMITING  . OTHER  DYSPHAGIA  . ABDOMINAL BLOATING  . FECAL INCONTINENCE  . ABNORMAL FECES  . DIARRHEA, BLOODY  . ABDOMINAL PAIN, LOWER  . PUD, HX OF  . TOBACCO USE, QUIT  . Stiffness of joints, not elsewhere classified, multiple sites  . Weakness of left leg  . Difficulty in walking  . Night sweats    PT - End of Session Activity Tolerance: Patient tolerated treatment well General Behavior During Session: [facility] for tasks performed Cognition: [facility] for tasks performed  Seth Bake, PTA 02/04/2012, 2:29 PM

## 2012-02-05 LAB — PULMONARY FUNCTION TEST

## 2012-02-06 ENCOUNTER — Telehealth (HOSPITAL_COMMUNITY): Payer: Self-pay

## 2012-02-06 ENCOUNTER — Ambulatory Visit (HOSPITAL_COMMUNITY): Payer: Medicare Other | Admitting: *Deleted

## 2012-02-06 DIAGNOSIS — H04129 Dry eye syndrome of unspecified lacrimal gland: Secondary | ICD-10-CM | POA: Diagnosis not present

## 2012-02-06 DIAGNOSIS — H35369 Drusen (degenerative) of macula, unspecified eye: Secondary | ICD-10-CM | POA: Diagnosis not present

## 2012-02-10 ENCOUNTER — Ambulatory Visit (HOSPITAL_COMMUNITY): Payer: Medicare Other | Admitting: *Deleted

## 2012-02-10 DIAGNOSIS — M545 Low back pain, unspecified: Secondary | ICD-10-CM | POA: Diagnosis not present

## 2012-02-10 DIAGNOSIS — M5137 Other intervertebral disc degeneration, lumbosacral region: Secondary | ICD-10-CM | POA: Diagnosis not present

## 2012-02-12 ENCOUNTER — Ambulatory Visit (HOSPITAL_COMMUNITY): Payer: Medicare Other | Admitting: Physical Therapy

## 2012-02-12 ENCOUNTER — Telehealth (HOSPITAL_COMMUNITY): Payer: Self-pay

## 2012-03-13 ENCOUNTER — Encounter: Payer: Self-pay | Admitting: Gastroenterology

## 2012-04-21 ENCOUNTER — Telehealth: Payer: Self-pay | Admitting: *Deleted

## 2012-04-21 ENCOUNTER — Encounter: Payer: Self-pay | Admitting: Gastroenterology

## 2012-04-21 NOTE — Telephone Encounter (Signed)
Called Whitney Galloway to confirm her appointment for tomorrow, she said that she would not be able to make it.  She was having a difficult time hearing me and asked me to call her daughter, Whitney Galloway at [phone].  I attempted to call her, however her phone was not inservice and a follow up appointment was not made for Whitney [facility] at this time.

## 2012-04-22 ENCOUNTER — Ambulatory Visit: Payer: Medicare Other | Admitting: Gastroenterology

## 2012-04-29 DIAGNOSIS — M545 Low back pain, unspecified: Secondary | ICD-10-CM | POA: Diagnosis not present

## 2012-05-04 DIAGNOSIS — H9319 Tinnitus, unspecified ear: Secondary | ICD-10-CM | POA: Diagnosis not present

## 2012-05-04 DIAGNOSIS — M4800 Spinal stenosis, site unspecified: Secondary | ICD-10-CM | POA: Diagnosis not present

## 2012-05-04 DIAGNOSIS — G4700 Insomnia, unspecified: Secondary | ICD-10-CM | POA: Diagnosis not present

## 2012-05-06 DIAGNOSIS — M545 Low back pain, unspecified: Secondary | ICD-10-CM | POA: Diagnosis not present

## 2012-05-06 DIAGNOSIS — M5137 Other intervertebral disc degeneration, lumbosacral region: Secondary | ICD-10-CM | POA: Diagnosis not present

## 2012-05-07 DIAGNOSIS — H9319 Tinnitus, unspecified ear: Secondary | ICD-10-CM | POA: Diagnosis not present

## 2012-05-15 DIAGNOSIS — H905 Unspecified sensorineural hearing loss: Secondary | ICD-10-CM | POA: Diagnosis not present

## 2012-05-15 DIAGNOSIS — H9319 Tinnitus, unspecified ear: Secondary | ICD-10-CM | POA: Diagnosis not present

## 2012-05-15 DIAGNOSIS — J342 Deviated nasal septum: Secondary | ICD-10-CM | POA: Diagnosis not present

## 2012-05-18 ENCOUNTER — Other Ambulatory Visit (HOSPITAL_COMMUNITY): Payer: Self-pay | Admitting: Internal Medicine

## 2012-05-18 DIAGNOSIS — Z139 Encounter for screening, unspecified: Secondary | ICD-10-CM

## 2012-05-21 ENCOUNTER — Other Ambulatory Visit: Payer: Self-pay | Admitting: Internal Medicine

## 2012-05-28 DIAGNOSIS — J209 Acute bronchitis, unspecified: Secondary | ICD-10-CM | POA: Diagnosis not present

## 2012-06-09 DIAGNOSIS — H35369 Drusen (degenerative) of macula, unspecified eye: Secondary | ICD-10-CM | POA: Diagnosis not present

## 2012-06-09 DIAGNOSIS — H04129 Dry eye syndrome of unspecified lacrimal gland: Secondary | ICD-10-CM | POA: Diagnosis not present

## 2012-06-10 ENCOUNTER — Encounter: Payer: Self-pay | Admitting: Gastroenterology

## 2012-06-11 ENCOUNTER — Encounter: Payer: Self-pay | Admitting: Gastroenterology

## 2012-06-11 ENCOUNTER — Ambulatory Visit (INDEPENDENT_AMBULATORY_CARE_PROVIDER_SITE_OTHER): Payer: Medicare Other | Admitting: Gastroenterology

## 2012-06-11 VITALS — BP 105/59 | HR 59 | Temp 97.6°F | Ht 68.0 in | Wt 142.4 lb

## 2012-06-11 DIAGNOSIS — K589 Irritable bowel syndrome without diarrhea: Secondary | ICD-10-CM

## 2012-06-11 DIAGNOSIS — K3184 Gastroparesis: Secondary | ICD-10-CM

## 2012-06-11 DIAGNOSIS — K625 Hemorrhage of anus and rectum: Secondary | ICD-10-CM | POA: Diagnosis not present

## 2012-06-11 NOTE — Patient Instructions (Addendum)
YOU SHOULD HAVE A COLONOSCOPY IF YOU ARE HAVING RECTAL BLEEDING.  I WILL SCHEDULE THE COLONOSCOPY AT [facility] IN JAN 2014.  CONTINUE PRILOSEC.  FOLLOW UP IN 6 MOS.   PATIENT IS SCHEDULED FOR A TCS WITH DR. GILLIAM AT [facility] ON 12/24/12 AT 10:50

## 2012-06-11 NOTE — Progress Notes (Signed)
Reminder in epic to follow up in 6 months with SF in E30 °

## 2012-06-11 NOTE — Progress Notes (Signed)
Subjective:    Patient ID: [name], female    DOB: 1929-05-16, 78 y.o.   MRN: [id number]  PCP: [facility]  HPI FEELING FATIGUED AND ~3 WEEKS AGO [id number]. COUGH IS BETTER. NO INTEREST IN ANYTHING. BOWELS ARE MOVING BETTER. DOES SEE BLOOD OCCASIONALLY. FEELS QUEAZY AND HAS LOWER ABD PAIN ONCE IN AWHILE. NO VOMITING. RECEIVING INJECTIONS FOR SPINAL STENOSIS/BACK PAIN.   Past Medical History  Diagnosis Date  . GERD (gastroesophageal reflux disease)   . HTN (hypertension)   . IBS (irritable bowel syndrome)   . Hernia of unspecified site of abdominal cavity without mention of obstruction or gangrene   . Hiatal hernia   . Diverticulosis   . Gastritis   . Splenic lesion HAMARTOMA  . Hard of hearing   . Colon polyp 1994 Malignant polyp    2005 NL BE; 2006 incomplete TCS DUE TO REDUNDNACY  . Fibromyalgia   . TB (tuberculosis) AGE 43  . Osteoporosis   . S/P endoscopy 2007, 2009    2007: single distal esophageal erosion, s/p 32 F dilation, no web/ring/stricture noted; 2009: normal, Bravo with adequate suppression on Prevacid BID  . Gastroparesis 2009    65% RETENTION AT 2 HOURS    Past Surgical History  Procedure Date  . Upper gastrointestinal endoscopy 2006  . Benign tumor removed from bilateral breast   . Colonoscopy w/ polypectomy 2006  . Mass removed from roof of mouth   . Cataract surgery   . Lung surgery for pulmonary nodules   . Left wrist cyst removal   . X-stop implantation   . Esophagogastroduodenoscopy 08/27/2011    hiatal hernia/mild gastritis  . Savory dilation 08/27/2011    stricture in the distal esophagus  . Esophagogastroduodenoscopy 05/25/2008     Normal esophagus without evidence of Barrett mass/  Normal stomach, duodenal bulb, and second portion of the duodenum  . Givens capsule study 05/27/2008    Adequate acid suppression with proton pump inhibitor.  . Esophagogastroduodenoscopy 08/11/2006    Single erosion at distal esophagus and a  small sliding hiatal hernia/ No evidence of ring or stricture but esophagus was dilated by passing 56 [place] Maloney dilator/ Prepyloric antral erythema but no evidence of erosions or peptic ulcer disease.  . Esophagogastroduodenoscopy 09/03/2005    Small sliding hiatal hernia without evidence of ring or  stricture formation. Esophagus dilated by passing 54-French Maloney dilator   given history of dysphagia    No Known Allergies  Current Outpatient Prescriptions  Medication Sig Dispense Refill  . ALPRAZolam (XANAX) 0.5 MG tablet Take 0.25 mg by mouth 2 (two) times daily.      [name] amlodipine-atorvastatin (CADUET) 10-20 MG per tablet Take 1 tablet by mouth daily.      . calcium carbonate (TUMS - DOSED IN MG ELEMENTAL CALCIUM) 500 MG chewable tablet Chew 1 tablet by mouth as needed. gas      . dicyclomine (BENTYL) 10 MG capsule Take 10 mg by mouth as needed. Stomach pain      . fexofenadine (ALLEGRA) 180 MG tablet Take 180 mg by mouth daily.      . fish oil-omega-3 fatty acids 1000 MG capsule Take 1 g by mouth daily.        . metoprolol succinate (TOPROL-XL) 25 MG 24 hr tablet Take 25 mg by mouth 2 (two) times daily.       . Multiple Vitamin (MULTIVITAMIN) tablet Take 1 tablet by mouth daily.        . nabumetone (RELAFEN)  750 MG tablet Take 750 mg by mouth 2 (two) times daily.        [name] omeprazole (PRILOSEC) 20 MG capsule Take 20 mg by mouth 3 (three) times daily. 1 po bid 30 MINUTES PRIOR TO YOUR MEALS      . ondansetron (ZOFRAN ODT) 4 MG disintegrating tablet 1 every 4-6 hours for nausea    . UNABLE TO FIND Place into both eyes daily. Refresh eye drops      . venlafaxine XR (EFFEXOR-XR) 150 MG 24 hr capsule Take 150 mg by mouth daily.          Review of Systems     Objective:   Physical Exam  Vitals reviewed. Constitutional: She is oriented to person, place, and time. She appears well-nourished. No distress.  HENT:  Head: Normocephalic and atraumatic.  Mouth/Throat: Oropharynx is  clear and moist. No oropharyngeal exudate.  Eyes: Pupils are equal, round, and reactive to light. No scleral icterus.  Neck: Normal range of motion. Neck supple.  Cardiovascular: Normal rate, regular rhythm and normal heart sounds.   Pulmonary/Chest: Effort normal and breath sounds normal. No respiratory distress.  Abdominal: Soft. Bowel sounds are normal. She exhibits no distension.  Neurological: She is alert and oriented to person, place, and time.       NO  NEW FOCAL DEFICITS   Psychiatric:       PT IS HOH  ANXIOUS MOOD, NL AFFECT          Assessment & Plan:

## 2012-06-11 NOTE — Assessment & Plan Note (Signed)
FAIRLY WELL CONTROLLED.  OPV IN 6 MOS.

## 2012-06-11 NOTE — Progress Notes (Signed)
Faxed to PCP

## 2012-06-11 NOTE — Assessment & Plan Note (Signed)
LAST TCS 2006 INCOMPLETE TO THE SPLENIC FLEXURE.  EXPLAINED TO PT AND DAUGHTER SHE SHOULD HAVE A TCS SOONER RATHER THAN LATER. PT WOULD LIKE TO WAIT UNTIL JAN 2014.  TCS [facility] WITH PROPOFOL/OVERTUBE/FLUROSCOPY DUE TO FAILURE OF CONSCIOUS SEDATION/POLYPHARMACY IN JAN 2014.  OPV IN 6 MOS.

## 2012-06-11 NOTE — Assessment & Plan Note (Signed)
CONTROLLED.  CONTINUE PRILOSEC. OPV IN 6 MOS. 

## 2012-06-16 DIAGNOSIS — E785 Hyperlipidemia, unspecified: Secondary | ICD-10-CM | POA: Diagnosis not present

## 2012-06-16 DIAGNOSIS — F341 Dysthymic disorder: Secondary | ICD-10-CM | POA: Diagnosis not present

## 2012-06-16 DIAGNOSIS — R7301 Impaired fasting glucose: Secondary | ICD-10-CM | POA: Diagnosis not present

## 2012-06-16 DIAGNOSIS — F411 Generalized anxiety disorder: Secondary | ICD-10-CM | POA: Diagnosis not present

## 2012-06-16 DIAGNOSIS — D649 Anemia, unspecified: Secondary | ICD-10-CM | POA: Diagnosis not present

## 2012-06-29 ENCOUNTER — Other Ambulatory Visit: Payer: Self-pay | Admitting: Anesthesiology

## 2012-06-29 ENCOUNTER — Ambulatory Visit (HOSPITAL_COMMUNITY): Payer: Medicare Other

## 2012-06-29 DIAGNOSIS — M545 Low back pain, unspecified: Secondary | ICD-10-CM

## 2012-07-01 ENCOUNTER — Ambulatory Visit (HOSPITAL_COMMUNITY): Payer: Medicare Other | Admitting: Oncology

## 2012-07-02 ENCOUNTER — Ambulatory Visit (HOSPITAL_COMMUNITY): Payer: Medicare Other | Admitting: Oncology

## 2012-07-30 DIAGNOSIS — Z23 Encounter for immunization: Secondary | ICD-10-CM | POA: Diagnosis not present

## 2012-09-01 ENCOUNTER — Other Ambulatory Visit: Payer: Medicare Other

## 2012-09-05 ENCOUNTER — Ambulatory Visit
Admission: RE | Admit: 2012-09-05 | Discharge: 2012-09-05 | Disposition: A | Discharge status: Home / Self Care | Payer: Medicare Other | Source: Ambulatory Visit | Attending: Anesthesiology | Admitting: Anesthesiology

## 2012-09-05 DIAGNOSIS — M48061 Spinal stenosis, lumbar region without neurogenic claudication: Secondary | ICD-10-CM | POA: Diagnosis not present

## 2012-09-05 DIAGNOSIS — M5126 Other intervertebral disc displacement, lumbar region: Secondary | ICD-10-CM | POA: Diagnosis not present

## 2012-09-05 DIAGNOSIS — M545 Low back pain, unspecified: Secondary | ICD-10-CM

## 2012-09-10 DIAGNOSIS — M5126 Other intervertebral disc displacement, lumbar region: Secondary | ICD-10-CM | POA: Diagnosis not present

## 2012-09-10 DIAGNOSIS — IMO0002 Reserved for concepts with insufficient information to code with codable children: Secondary | ICD-10-CM | POA: Diagnosis not present

## 2012-09-10 DIAGNOSIS — M47817 Spondylosis without myelopathy or radiculopathy, lumbosacral region: Secondary | ICD-10-CM | POA: Diagnosis not present

## 2012-09-14 ENCOUNTER — Other Ambulatory Visit: Payer: Self-pay | Admitting: Neurological Surgery

## 2012-09-14 ENCOUNTER — Other Ambulatory Visit: Payer: Medicare Other

## 2012-09-15 ENCOUNTER — Ambulatory Visit (HOSPITAL_COMMUNITY): Payer: Medicare Other

## 2012-09-15 ENCOUNTER — Telehealth: Payer: Self-pay | Admitting: Gastroenterology

## 2012-09-15 NOTE — Telephone Encounter (Signed)
Patients referral has been sent to [facility] for TCS and patients daughter Elita Quick is aware

## 2012-10-06 DIAGNOSIS — J069 Acute upper respiratory infection, unspecified: Secondary | ICD-10-CM | POA: Diagnosis not present

## 2012-10-07 HISTORY — PX: BACK SURGERY: SHX140

## 2012-10-09 ENCOUNTER — Encounter (HOSPITAL_COMMUNITY): Payer: Self-pay | Admitting: Pharmacy Technician

## 2012-10-13 ENCOUNTER — Encounter (HOSPITAL_COMMUNITY)
Admission: RE | Admit: 2012-10-13 | Discharge: 2012-10-13 | Disposition: A | Discharge status: Home / Self Care | Payer: Medicare Other | Source: Ambulatory Visit | Attending: Neurological Surgery | Admitting: Neurological Surgery

## 2012-10-13 ENCOUNTER — Encounter (HOSPITAL_COMMUNITY)
Admission: RE | Admit: 2012-10-13 | Discharge: 2012-10-13 | Disposition: A | Discharge status: Home / Self Care | Payer: Medicare Other | Source: Ambulatory Visit | Attending: Anesthesiology | Admitting: Anesthesiology

## 2012-10-13 ENCOUNTER — Encounter (HOSPITAL_COMMUNITY): Payer: Self-pay

## 2012-10-13 DIAGNOSIS — R05 Cough: Secondary | ICD-10-CM | POA: Diagnosis not present

## 2012-10-13 DIAGNOSIS — R059 Cough, unspecified: Secondary | ICD-10-CM | POA: Diagnosis not present

## 2012-10-13 DIAGNOSIS — Z01818 Encounter for other preprocedural examination: Secondary | ICD-10-CM | POA: Diagnosis not present

## 2012-10-13 LAB — CBC
MCV: 85.6 fL (ref 78.0–100.0)
Platelets: 271 10*3/uL (ref 150–400)
RDW: 13.1 % (ref 11.5–15.5)
WBC: 8.1 10*3/uL (ref 4.0–10.5)

## 2012-10-13 LAB — TYPE AND SCREEN: Antibody Screen: NEGATIVE

## 2012-10-13 LAB — SURGICAL PCR SCREEN
MRSA, PCR: NEGATIVE
Staphylococcus aureus: NEGATIVE

## 2012-10-13 LAB — BASIC METABOLIC PANEL
Calcium: 9.6 mg/dL (ref 8.4–10.5)
Creatinine, Ser: 0.71 mg/dL (ref 0.50–1.10)
GFR calc Af Amer: >90 mL/min (ref >90)
GFR calc non Af Amer: 79 mL/min — ABNORMAL LOW (ref >90)

## 2012-10-13 NOTE — Pre-Procedure Instructions (Signed)
20 Nikayla Madaris [facility]  10/13/2012   Your procedure is scheduled on:  January 13  Report to [facility] at 07:30 AM.  Call this number if you have problems the morning of surgery: [phone]   Remember:   Do not eat or drink:After Midnight.  Take these medicines the morning of surgery with A SIP OF WATER: albuterol, Caduet, Bentyl, Ativan, Metoprolol, Omeprazole, Eye drops, Effexor   STOP fish oil and Multiple Vitamins after today  Do not wear jewelry, make-up or nail polish.  Do not wear lotions, powders, or perfumes. You may wear deodorant.  Do not shave 48 hours prior to surgery. Men may shave face and neck.  Do not bring valuables to the hospital.  Contacts, dentures or bridgework may not be worn into surgery.  Leave suitcase in the car. After surgery it may be brought to your room.  For patients admitted to the hospital, checkout time is 11:00 AM the day of discharge.   Special Instructions: Shower using CHG 2 nights before surgery and the night before surgery.  If you shower the day of surgery use CHG.  Use special wash - you have one bottle of CHG for all showers.  You should use approximately 1/3 of the bottle for each shower.   Please read over the following fact sheets that you were given: Pain Booklet, Coughing and Deep Breathing, Blood Transfusion Information and Surgical Site Infection Prevention

## 2012-10-18 MED ORDER — CEFAZOLIN SODIUM-DEXTROSE 2-3 GM-% IV SOLR
2.0000 g | INTRAVENOUS | Status: AC
  Administered 2012-10-19: 2 g via INTRAVENOUS
  Filled 2012-10-18: qty 50

## 2012-10-19 ENCOUNTER — Encounter (HOSPITAL_COMMUNITY): Payer: Self-pay | Admitting: Surgery

## 2012-10-19 ENCOUNTER — Encounter (HOSPITAL_COMMUNITY): Payer: Self-pay | Admitting: Anesthesiology

## 2012-10-19 ENCOUNTER — Ambulatory Visit (HOSPITAL_COMMUNITY): Payer: Medicare Other | Admitting: Oncology

## 2012-10-19 ENCOUNTER — Inpatient Hospital Stay (HOSPITAL_COMMUNITY)
Admission: RE | Admit: 2012-10-19 | Discharge: 2012-10-22 | DRG: 460 | Disposition: A | Discharge status: Skilled Nursing Facility | Payer: Medicare Other | Source: Ambulatory Visit | Attending: Neurological Surgery | Admitting: Neurological Surgery

## 2012-10-19 ENCOUNTER — Inpatient Hospital Stay (HOSPITAL_COMMUNITY): Payer: Medicare Other

## 2012-10-19 ENCOUNTER — Inpatient Hospital Stay (HOSPITAL_COMMUNITY): Payer: Medicare Other | Admitting: Anesthesiology

## 2012-10-19 ENCOUNTER — Encounter (HOSPITAL_COMMUNITY)
Admission: RE | Disposition: A | Discharge status: Skilled Nursing Facility | Payer: Self-pay | Source: Ambulatory Visit | Attending: Neurological Surgery

## 2012-10-19 DIAGNOSIS — Z01818 Encounter for other preprocedural examination: Secondary | ICD-10-CM

## 2012-10-19 DIAGNOSIS — M533 Sacrococcygeal disorders, not elsewhere classified: Secondary | ICD-10-CM | POA: Diagnosis not present

## 2012-10-19 DIAGNOSIS — Y9210 Unspecified residential institution as the place of occurrence of the external cause: Secondary | ICD-10-CM | POA: Diagnosis not present

## 2012-10-19 DIAGNOSIS — K449 Diaphragmatic hernia without obstruction or gangrene: Secondary | ICD-10-CM | POA: Diagnosis present

## 2012-10-19 DIAGNOSIS — G9741 Accidental puncture or laceration of dura during a procedure: Secondary | ICD-10-CM | POA: Diagnosis not present

## 2012-10-19 DIAGNOSIS — G3184 Mild cognitive impairment, so stated: Secondary | ICD-10-CM | POA: Diagnosis not present

## 2012-10-19 DIAGNOSIS — M545 Low back pain, unspecified: Secondary | ICD-10-CM | POA: Diagnosis not present

## 2012-10-19 DIAGNOSIS — Z01812 Encounter for preprocedural laboratory examination: Secondary | ICD-10-CM

## 2012-10-19 DIAGNOSIS — M47817 Spondylosis without myelopathy or radiculopathy, lumbosacral region: Secondary | ICD-10-CM | POA: Diagnosis present

## 2012-10-19 DIAGNOSIS — F329 Major depressive disorder, single episode, unspecified: Secondary | ICD-10-CM | POA: Diagnosis present

## 2012-10-19 DIAGNOSIS — M48061 Spinal stenosis, lumbar region without neurogenic claudication: Secondary | ICD-10-CM | POA: Diagnosis not present

## 2012-10-19 DIAGNOSIS — K219 Gastro-esophageal reflux disease without esophagitis: Secondary | ICD-10-CM | POA: Diagnosis present

## 2012-10-19 DIAGNOSIS — Z85038 Personal history of other malignant neoplasm of large intestine: Secondary | ICD-10-CM

## 2012-10-19 DIAGNOSIS — D62 Acute posthemorrhagic anemia: Secondary | ICD-10-CM | POA: Diagnosis not present

## 2012-10-19 DIAGNOSIS — J438 Other emphysema: Secondary | ICD-10-CM | POA: Diagnosis present

## 2012-10-19 DIAGNOSIS — I10 Essential (primary) hypertension: Secondary | ICD-10-CM | POA: Diagnosis present

## 2012-10-19 DIAGNOSIS — M5126 Other intervertebral disc displacement, lumbar region: Principal | ICD-10-CM | POA: Diagnosis present

## 2012-10-19 DIAGNOSIS — Z79899 Other long term (current) drug therapy: Secondary | ICD-10-CM

## 2012-10-19 DIAGNOSIS — IMO0002 Reserved for concepts with insufficient information to code with codable children: Secondary | ICD-10-CM | POA: Diagnosis not present

## 2012-10-19 DIAGNOSIS — IMO0001 Reserved for inherently not codable concepts without codable children: Secondary | ICD-10-CM | POA: Diagnosis present

## 2012-10-19 DIAGNOSIS — F3289 Other specified depressive episodes: Secondary | ICD-10-CM | POA: Diagnosis present

## 2012-10-19 DIAGNOSIS — R262 Difficulty in walking, not elsewhere classified: Secondary | ICD-10-CM | POA: Diagnosis not present

## 2012-10-19 DIAGNOSIS — M6281 Muscle weakness (generalized): Secondary | ICD-10-CM | POA: Diagnosis not present

## 2012-10-19 DIAGNOSIS — Q762 Congenital spondylolisthesis: Secondary | ICD-10-CM

## 2012-10-19 DIAGNOSIS — R41841 Cognitive communication deficit: Secondary | ICD-10-CM | POA: Diagnosis not present

## 2012-10-19 DIAGNOSIS — M549 Dorsalgia, unspecified: Secondary | ICD-10-CM | POA: Diagnosis not present

## 2012-10-19 SURGERY — POSTERIOR LUMBAR FUSION 1 LEVEL
Anesthesia: General | Site: Spine Lumbar | Laterality: Bilateral | Wound class: Clean

## 2012-10-19 MED ORDER — BUPIVACAINE HCL (PF) 0.5 % IJ SOLN
INTRAMUSCULAR | Status: DC | PRN
  Administered 2012-10-19: 5 mL

## 2012-10-19 MED ORDER — ALBUTEROL SULFATE HFA 108 (90 BASE) MCG/ACT IN AERS
2.0000 | INHALATION_SPRAY | Freq: Four times a day (QID) | RESPIRATORY_TRACT | Status: DC | PRN
  Filled 2012-10-19: qty 6.7

## 2012-10-19 MED ORDER — AMLODIPINE-ATORVASTATIN 10-20 MG PO TABS: 1.0000 | ORAL_TABLET | Freq: Every day | ORAL | Status: DC

## 2012-10-19 MED ORDER — ALUM & MAG HYDROXIDE-SIMETH 200-200-20 MG/5ML PO SUSP: 30.0000 mL | Freq: Four times a day (QID) | ORAL | Status: DC | PRN

## 2012-10-19 MED ORDER — OXYCODONE-ACETAMINOPHEN 5-325 MG PO TABS
1.0000 | ORAL_TABLET | ORAL | Status: DC | PRN
  Administered 2012-10-19 – 2012-10-22 (×11): 2 via ORAL
  Filled 2012-10-19 (×11): qty 2

## 2012-10-19 MED ORDER — AMLODIPINE BESYLATE 10 MG PO TABS
10.0000 mg | ORAL_TABLET | Freq: Every day | ORAL | Status: DC
  Administered 2012-10-21 – 2012-10-22 (×2): 10 mg via ORAL
  Filled 2012-10-19 (×4): qty 1

## 2012-10-19 MED ORDER — MORPHINE SULFATE 2 MG/ML IJ SOLN
1.0000 mg | INTRAMUSCULAR | Status: DC | PRN
  Administered 2012-10-19: 2 mg via INTRAVENOUS

## 2012-10-19 MED ORDER — SODIUM CHLORIDE 0.9 % IV SOLN: 250.0000 mL | INTRAVENOUS | Status: DC

## 2012-10-19 MED ORDER — ONDANSETRON HCL 4 MG/2ML IJ SOLN
4.0000 mg | INTRAMUSCULAR | Status: DC | PRN
  Administered 2012-10-19: 4 mg via INTRAVENOUS
  Filled 2012-10-19: qty 2

## 2012-10-19 MED ORDER — ROCURONIUM BROMIDE 100 MG/10ML IV SOLN
INTRAVENOUS | Status: DC | PRN
  Administered 2012-10-19: 10 mg via INTRAVENOUS
  Administered 2012-10-19: 50 mg via INTRAVENOUS

## 2012-10-19 MED ORDER — PROMETHAZINE HCL 25 MG/ML IJ SOLN
12.5000 mg | Freq: Four times a day (QID) | INTRAMUSCULAR | Status: DC | PRN
  Administered 2012-10-19: 12.5 mg via INTRAVENOUS
  Filled 2012-10-19 (×2): qty 1

## 2012-10-19 MED ORDER — CEFAZOLIN SODIUM 1-5 GM-% IV SOLN
1.0000 g | Freq: Three times a day (TID) | INTRAVENOUS | Status: AC
  Administered 2012-10-19 – 2012-10-20 (×2): 1 g via INTRAVENOUS
  Filled 2012-10-19 (×3): qty 50

## 2012-10-19 MED ORDER — METHOCARBAMOL 500 MG PO TABS
500.0000 mg | ORAL_TABLET | Freq: Four times a day (QID) | ORAL | Status: DC | PRN
  Administered 2012-10-20 – 2012-10-21 (×3): 500 mg via ORAL
  Filled 2012-10-19 (×3): qty 1

## 2012-10-19 MED ORDER — GLYCOPYRROLATE 0.2 MG/ML IJ SOLN
INTRAMUSCULAR | Status: DC | PRN
  Administered 2012-10-19: 0.6 mg via INTRAVENOUS

## 2012-10-19 MED ORDER — SODIUM CHLORIDE 0.9 % IV SOLN
INTRAVENOUS | Status: DC
  Administered 2012-10-19: 17:00:00 via INTRAVENOUS

## 2012-10-19 MED ORDER — METOPROLOL SUCCINATE ER 25 MG PO TB24
25.0000 mg | ORAL_TABLET | Freq: Two times a day (BID) | ORAL | Status: DC
  Administered 2012-10-20 – 2012-10-22 (×3): 25 mg via ORAL
  Filled 2012-10-19 (×7): qty 1

## 2012-10-19 MED ORDER — ACETAMINOPHEN 650 MG RE SUPP: 650.0000 mg | RECTAL | Status: DC | PRN

## 2012-10-19 MED ORDER — ONDANSETRON HCL 4 MG/2ML IJ SOLN
INTRAMUSCULAR | Status: DC | PRN
  Administered 2012-10-19: 4 mg via INTRAVENOUS

## 2012-10-19 MED ORDER — THROMBIN 20000 UNITS EX SOLR
CUTANEOUS | Status: DC | PRN
  Administered 2012-10-19: 13:00:00 via TOPICAL

## 2012-10-19 MED ORDER — SODIUM CHLORIDE 0.9 % IV SOLN
INTRAVENOUS | Status: DC | PRN
  Administered 2012-10-19: 15:00:00 via INTRAVENOUS

## 2012-10-19 MED ORDER — PHENOL 1.4 % MT LIQD: 1.0000 | OROMUCOSAL | Status: DC | PRN

## 2012-10-19 MED ORDER — FENTANYL CITRATE 0.05 MG/ML IJ SOLN
INTRAMUSCULAR | Status: AC
  Filled 2012-10-19: qty 2

## 2012-10-19 MED ORDER — VENLAFAXINE HCL ER 150 MG PO CP24
150.0000 mg | ORAL_CAPSULE | Freq: Every day | ORAL | Status: DC
  Administered 2012-10-19 – 2012-10-22 (×4): 150 mg via ORAL
  Filled 2012-10-19 (×4): qty 1

## 2012-10-19 MED ORDER — FENTANYL CITRATE 0.05 MG/ML IJ SOLN
25.0000 ug | INTRAMUSCULAR | Status: DC | PRN
  Administered 2012-10-19 (×5): 25 ug via INTRAVENOUS

## 2012-10-19 MED ORDER — HYDROMORPHONE HCL PF 1 MG/ML IJ SOLN
0.5000 mg | INTRAMUSCULAR | Status: DC | PRN
  Administered 2012-10-20 – 2012-10-21 (×4): 0.5 mg via INTRAVENOUS
  Filled 2012-10-19 (×4): qty 1

## 2012-10-19 MED ORDER — LORAZEPAM 1 MG PO TABS
1.0000 mg | ORAL_TABLET | Freq: Three times a day (TID) | ORAL | Status: DC
  Administered 2012-10-19 – 2012-10-22 (×7): 1 mg via ORAL
  Filled 2012-10-19 (×8): qty 1

## 2012-10-19 MED ORDER — OXYCODONE HCL 5 MG PO TABS: 5.0000 mg | ORAL_TABLET | Freq: Once | ORAL | Status: DC | PRN

## 2012-10-19 MED ORDER — ATORVASTATIN CALCIUM 20 MG PO TABS
20.0000 mg | ORAL_TABLET | Freq: Every day | ORAL | Status: DC
  Administered 2012-10-20 – 2012-10-21 (×2): 20 mg via ORAL
  Filled 2012-10-19 (×3): qty 1

## 2012-10-19 MED ORDER — ALBUMIN HUMAN 5 % IV SOLN
INTRAVENOUS | Status: DC | PRN
  Administered 2012-10-19: 15:00:00 via INTRAVENOUS

## 2012-10-19 MED ORDER — LIDOCAINE HCL (CARDIAC) 20 MG/ML IV SOLN
INTRAVENOUS | Status: DC | PRN
  Administered 2012-10-19: 50 mg via INTRAVENOUS

## 2012-10-19 MED ORDER — MENTHOL 3 MG MT LOZG: 1.0000 | LOZENGE | OROMUCOSAL | Status: DC | PRN

## 2012-10-19 MED ORDER — SODIUM CHLORIDE 0.9 % IJ SOLN: 3.0000 mL | INTRAMUSCULAR | Status: DC | PRN

## 2012-10-19 MED ORDER — SODIUM CHLORIDE 0.9 % IR SOLN
Status: DC | PRN
  Administered 2012-10-19: 13:00:00

## 2012-10-19 MED ORDER — ONDANSETRON HCL 4 MG/2ML IJ SOLN: 4.0000 mg | Freq: Four times a day (QID) | INTRAMUSCULAR | Status: DC | PRN

## 2012-10-19 MED ORDER — NEOSTIGMINE METHYLSULFATE 1 MG/ML IJ SOLN
INTRAMUSCULAR | Status: DC | PRN
  Administered 2012-10-19: 3 mg via INTRAVENOUS

## 2012-10-19 MED ORDER — LACTATED RINGERS IV SOLN
INTRAVENOUS | Status: DC | PRN
  Administered 2012-10-19 (×3): via INTRAVENOUS

## 2012-10-19 MED ORDER — SODIUM CHLORIDE 0.9 % IJ SOLN
3.0000 mL | Freq: Two times a day (BID) | INTRAMUSCULAR | Status: DC
  Administered 2012-10-20 – 2012-10-22 (×5): 3 mL via INTRAVENOUS

## 2012-10-19 MED ORDER — OXYCODONE HCL 5 MG/5ML PO SOLN: 5.0000 mg | Freq: Once | ORAL | Status: DC | PRN

## 2012-10-19 MED ORDER — THROMBIN 5000 UNITS EX SOLR
OROMUCOSAL | Status: DC | PRN
  Administered 2012-10-19: 13:00:00 via TOPICAL

## 2012-10-19 MED ORDER — ACETAMINOPHEN 325 MG PO TABS
650.0000 mg | ORAL_TABLET | ORAL | Status: DC | PRN
  Administered 2012-10-20: 650 mg via ORAL
  Filled 2012-10-19: qty 2

## 2012-10-19 MED ORDER — LIDOCAINE-EPINEPHRINE 1 %-1:100000 IJ SOLN
INTRAMUSCULAR | Status: DC | PRN
  Administered 2012-10-19: 5 mL

## 2012-10-19 MED ORDER — MORPHINE SULFATE 2 MG/ML IJ SOLN
INTRAMUSCULAR | Status: AC
  Filled 2012-10-19: qty 1

## 2012-10-19 MED ORDER — SODIUM CHLORIDE 0.9 % IV SOLN
INTRAVENOUS | Status: AC
  Filled 2012-10-19: qty 500

## 2012-10-19 MED ORDER — PANTOPRAZOLE SODIUM 40 MG PO TBEC
40.0000 mg | DELAYED_RELEASE_TABLET | Freq: Every day | ORAL | Status: DC
  Administered 2012-10-20 – 2012-10-22 (×3): 40 mg via ORAL
  Filled 2012-10-19 (×3): qty 1

## 2012-10-19 MED ORDER — DEXTROSE 5 % IV SOLN
500.0000 mg | Freq: Four times a day (QID) | INTRAVENOUS | Status: DC | PRN
  Filled 2012-10-19: qty 5

## 2012-10-19 MED ORDER — PROPOFOL 10 MG/ML IV BOLUS
INTRAVENOUS | Status: DC | PRN
  Administered 2012-10-19: 100 mg via INTRAVENOUS

## 2012-10-19 MED ORDER — DICYCLOMINE HCL 10 MG PO CAPS
10.0000 mg | ORAL_CAPSULE | ORAL | Status: DC | PRN
  Filled 2012-10-19: qty 1

## 2012-10-19 MED ORDER — EPHEDRINE SULFATE 50 MG/ML IJ SOLN
INTRAMUSCULAR | Status: DC | PRN
  Administered 2012-10-19 (×2): 5 mg via INTRAVENOUS

## 2012-10-19 MED ORDER — FENTANYL CITRATE 0.05 MG/ML IJ SOLN
INTRAMUSCULAR | Status: DC | PRN
  Administered 2012-10-19 (×2): 50 ug via INTRAVENOUS
  Administered 2012-10-19: 100 ug via INTRAVENOUS
  Administered 2012-10-19: 50 ug via INTRAVENOUS

## 2012-10-19 MED ORDER — BACITRACIN 50000 UNITS IM SOLR
INTRAMUSCULAR | Status: AC
  Filled 2012-10-19: qty 1

## 2012-10-19 SURGICAL SUPPLY — 67 items
BAG DECANTER FOR FLEXI CONT (MISCELLANEOUS) ×2 IMPLANT
BLADE SURG ROTATE 9660 (MISCELLANEOUS) IMPLANT
BUR MATCHSTICK NEURO 3.0 LAGG (BURR) ×2 IMPLANT
CANISTER SUCTION 2500CC (MISCELLANEOUS) ×2 IMPLANT
CLOTH BEACON ORANGE TIMEOUT ST (SAFETY) ×2 IMPLANT
CONT SPEC 4OZ CLIKSEAL STRL BL (MISCELLANEOUS) ×4 IMPLANT
COVER BACK TABLE 24X17X13 BIG (DRAPES) IMPLANT
COVER TABLE BACK 60X90 (DRAPES) ×2 IMPLANT
CROSSLINK MEDIUM (Cage) ×2 IMPLANT
DECANTER SPIKE VIAL GLASS SM (MISCELLANEOUS) ×2 IMPLANT
DERMABOND ADHESIVE PROPEN (GAUZE/BANDAGES/DRESSINGS) ×1
DERMABOND ADVANCED (GAUZE/BANDAGES/DRESSINGS)
DERMABOND ADVANCED .7 DNX12 (GAUZE/BANDAGES/DRESSINGS) IMPLANT
DERMABOND ADVANCED .7 DNX6 (GAUZE/BANDAGES/DRESSINGS) ×1 IMPLANT
DRAPE C-ARM 42X72 X-RAY (DRAPES) ×4 IMPLANT
DRAPE LAPAROTOMY 100X72X124 (DRAPES) ×2 IMPLANT
DRAPE POUCH INSTRU U-SHP 10X18 (DRAPES) ×2 IMPLANT
DRAPE PROXIMA HALF (DRAPES) ×2 IMPLANT
DURAPREP 26ML APPLICATOR (WOUND CARE) ×2 IMPLANT
ELECT REM PT RETURN 9FT ADLT (ELECTROSURGICAL) ×2
ELECTRODE REM PT RTRN 9FT ADLT (ELECTROSURGICAL) ×1 IMPLANT
GAUZE SPONGE 4X4 16PLY XRAY LF (GAUZE/BANDAGES/DRESSINGS) ×2 IMPLANT
GLOVE BIO SURGEON STRL SZ8 (GLOVE) ×2 IMPLANT
GLOVE BIOGEL PI IND STRL 7.5 (GLOVE) ×1 IMPLANT
GLOVE BIOGEL PI IND STRL 8.5 (GLOVE) ×4 IMPLANT
GLOVE BIOGEL PI INDICATOR 7.5 (GLOVE) ×1
GLOVE BIOGEL PI INDICATOR 8.5 (GLOVE) ×4
GLOVE ECLIPSE 7.5 STRL STRAW (GLOVE) ×4 IMPLANT
GLOVE ECLIPSE 8.5 STRL (GLOVE) ×6 IMPLANT
GLOVE EXAM NITRILE LRG STRL (GLOVE) IMPLANT
GLOVE EXAM NITRILE MD LF STRL (GLOVE) IMPLANT
GLOVE EXAM NITRILE XL STR (GLOVE) IMPLANT
GLOVE EXAM NITRILE XS STR PU (GLOVE) IMPLANT
GLOVE INDICATOR 8.5 STRL (GLOVE) ×2 IMPLANT
GLOVE SURG SS PI 8.0 STRL IVOR (GLOVE) ×6 IMPLANT
GOWN BRE IMP SLV AUR LG STRL (GOWN DISPOSABLE) IMPLANT
GOWN BRE IMP SLV AUR XL STRL (GOWN DISPOSABLE) ×6 IMPLANT
GOWN STRL REIN 2XL LVL4 (GOWN DISPOSABLE) ×8 IMPLANT
HEMOSTAT POWDER SURGIFOAM 1G (HEMOSTASIS) ×2 IMPLANT
KIT BASIN OR (CUSTOM PROCEDURE TRAY) ×2 IMPLANT
KIT ROOM TURNOVER OR (KITS) ×2 IMPLANT
NEEDLE HYPO 22GX1.5 SAFETY (NEEDLE) ×2 IMPLANT
NS IRRIG 1000ML POUR BTL (IV SOLUTION) ×2 IMPLANT
PACK FOAM VITOSS 10CC (Orthopedic Implant) ×2 IMPLANT
PACK LAMINECTOMY NEURO (CUSTOM PROCEDURE TRAY) ×2 IMPLANT
PAD ARMBOARD 7.5X6 YLW CONV (MISCELLANEOUS) ×10 IMPLANT
PATTIES SURGICAL .5 X.5 (GAUZE/BANDAGES/DRESSINGS) ×2 IMPLANT
PATTIES SURGICAL .5 X1 (DISPOSABLE) ×2 IMPLANT
PEEK PLIF NOVEL 9X25X8MM (Peek) ×4 IMPLANT
ROD 35MM (Rod) ×4 IMPLANT
SCREW 40MM (Screw) ×8 IMPLANT
SCREW SET SPINAL STD HEXALOBE (Screw) ×8 IMPLANT
SPONGE GAUZE 4X4 12PLY (GAUZE/BANDAGES/DRESSINGS) ×2 IMPLANT
SPONGE LAP 4X18 X RAY DECT (DISPOSABLE) IMPLANT
SPONGE SURGIFOAM ABS GEL 100 (HEMOSTASIS) ×2 IMPLANT
SUT PROLENE 6 0 BV (SUTURE) ×6 IMPLANT
SUT VIC AB 1 CT1 18XBRD ANBCTR (SUTURE) ×1 IMPLANT
SUT VIC AB 1 CT1 8-18 (SUTURE) ×1
SUT VIC AB 2-0 CP2 18 (SUTURE) ×4 IMPLANT
SUT VIC AB 3-0 SH 8-18 (SUTURE) ×4 IMPLANT
SYR 20ML ECCENTRIC (SYRINGE) ×2 IMPLANT
TAPE CLOTH SURG 4X10 WHT LF (GAUZE/BANDAGES/DRESSINGS) ×2 IMPLANT
TOWEL OR 17X24 6PK STRL BLUE (TOWEL DISPOSABLE) ×2 IMPLANT
TOWEL OR 17X26 10 PK STRL BLUE (TOWEL DISPOSABLE) ×2 IMPLANT
TRAP SPECIMEN MUCOUS 40CC (MISCELLANEOUS) ×2 IMPLANT
TRAY FOLEY CATH 14FRSI W/METER (CATHETERS) ×2 IMPLANT
WATER STERILE IRR 1000ML POUR (IV SOLUTION) ×2 IMPLANT

## 2012-10-19 NOTE — Anesthesia Preprocedure Evaluation (Addendum)
Anesthesia Evaluation  Patient identified by MRN, date of birth, ID band Patient awake    Reviewed: Allergy & Precautions, H&P , NPO status , Patient's Chart, lab work & pertinent test results  History of Anesthesia Complications (+) PONV  Airway Mallampati: II  Neck ROM: full    Dental   Pulmonary          Cardiovascular hypertension,     Neuro/Psych Depression  Neuromuscular disease    GI/Hepatic hiatal hernia, GERD-  ,  Endo/Other    Renal/GU      Musculoskeletal  (+) Fibromyalgia -  Abdominal   Peds  Hematology   Anesthesia Other Findings 10/21/11 post op visit on floor.  Pt had nausea and vomiting the night of surgery.  Phenergan seemed to help per pt. Carlynn Herald, CRNA  Reproductive/Obstetrics                          Anesthesia Physical Anesthesia Plan  ASA: III  Anesthesia Plan: General   Post-op Pain Management:    Induction: Intravenous  Airway Management Planned: Oral ETT  Additional Equipment:   Intra-op Plan:   Post-operative Plan: Extubation in OR  Informed Consent: I have reviewed the patients History and Physical, chart, labs and discussed the procedure including the risks, benefits and alternatives for the proposed anesthesia with the patient or authorized representative who has indicated his/her understanding and acceptance.     Plan Discussed with: CRNA and Surgeon  Anesthesia Plan Comments:         Anesthesia Quick Evaluation

## 2012-10-19 NOTE — Progress Notes (Signed)
Patient ID: Whitney Galloway, female   DOB: Aug 02, 1929, 79 y.o.   MRN: [id number] Vss. C/o back pain and some leg pain. Motor appears intact. Stable post op

## 2012-10-19 NOTE — Op Note (Addendum)
Procedure: L3-4 decompressive laminectomy decompression of L3 and L4 nerve roots, posterior lumbar interbody arthrodesis with peek spacers local autograft and allograft, pedicle screw fixation L3-4, posterior lateral arthrodesis L3  Surgeon: Barnett Abu M.D.  Asst.: Tressie Stalker M.D.  Indications: Patient is Whitney Galloway is a 79 y.o. female who who's had significant back pain and lumbar radiculopathy for over a years period time. A lumbar MRI demonstrates  an enlarging herniated nucleus pulposus at L3-L4 with high-grade canal stenosis. she was advised regarding surgical intervention.  Procedure: The patient was brought to the operating room supine on a stretcher. After the smooth induction of general endotracheal anesthesia she was turned prone and the back was prepped with alcohol and DuraPrep. The back was then draped sterilely. A midline incision was created and carried down to the lumbar dorsal fascia. A localizing radiograph identified the L4 and L5 spinous processes. A subligamentous dissection was created at L3 and L4 to expose the interlaminar space at L3-4 and the facet joints over the L3-4 interspace. Laminotomies were were then created removing the entire inferior margin of the lamina of L3 including the inferior facet at the L3-4 joint. The yellow ligament was taken up and the common dural tube was exposed along with the L3 nerve root superiorly, and the L4 nerve root inferiorly, the disc space was exposed and epidural veins in this region were cauterized and divided. The L4 nerve roots and the L3 nerve root were dissected with care taken to protect them. During the laminectomy and inadvertent durotomy was created in the central portion of the dura under the laminar arch of L3. This was at the area of the central disc herniation. The entire arch of the lamina of L3 was therefore removed. Dissection was carefully performed to detect the dural contents. The durotomy was then closed with  6-0 Prolene in a running fashion once the ends were isolated and hernia nerve roots could be reinserted into the dural tube. An additional 6-0 Prolene suture was required for final watertight closure. The disc space was opened and a combination of curettes and rongeurs was used to evacuate the disc space fully. The endplates were removed using sharp curettes. An interbody spacer was placed to distract the disc space while the contralateral discectomy was performed. When the entirety of the disc was removed and the endplates were prepared final sizing of the disc space was obtained 8 mm peek spacers were chosen and packed with autograft and allograft and placed into the interspace. The remainder of the interspace was packed with autograft and allograft. Pedicle entry sites were then chosen using fluoroscopic guidance and 6.5 x 40 mm screws were placed in L4 and 6.5 x 40 mm screws were placed in L3. The lateral gutters were decorticated and graft was packed in the posterolateral gutters between L4 and L3. Final radiographs were obtained after placing appropriately sized rods between the pedicle screws at L3-4 and torquing these to the appropriate tension. The transverse connector was also placed and secured tightly. The surgical site was inspected carefully to assure the L4 and L3 nerve roots were well decompressed, hemostasis was obtained, and the graft was well packed. Then the retractors were removed and the wound was closed with #1 Vicryl in the lumbar dorsal fascia 2-0 Vicryl in the subcutaneous tissue and 3-0 Vicryl subcuticularly. When he cc of half percent Marcaine was injected into the paraspinous musculature at the time of closure. Blood loss was estimated at 400 cc. The patient tolerated  procedure well and was returned to the recovery room in stable condition. 100 cc of Cell Saver blood was returned to the patient.

## 2012-10-19 NOTE — OR Nursing (Signed)
Localization xray read by [facility] Radiologist to verify level at which spinal needle was placed

## 2012-10-19 NOTE — Progress Notes (Signed)
Pt admitted to 4N10 from pacu. Pt alert and oriented. Family at bedside.

## 2012-10-19 NOTE — Transfer of Care (Signed)
Immediate Anesthesia Transfer of Care Note  Patient: Whitney Galloway [facility]  Procedure(s) Performed: Procedure(s) (LRB) with comments: POSTERIOR LUMBAR FUSION 1 LEVEL (Bilateral) - Lumbar three-four Bilateral microdiskectomy, posterior lumbar interbody fusion, peek, pedicle screws  Patient Location: PACU  Anesthesia Type:General  Level of Consciousness: awake and alert   Airway & Oxygen Therapy: Patient Spontanous Breathing and Patient connected to nasal cannula oxygen  Post-op Assessment: Report given to PACU RN and Post -op Vital signs reviewed and stable  Post vital signs: Reviewed and stable  Complications: No apparent anesthesia complications

## 2012-10-19 NOTE — Anesthesia Procedure Notes (Signed)
Procedure Name: Intubation Date/Time: 10/19/2012 11:20 AM Performed by: Gwenyth Allegra Pre-anesthesia Checklist: Patient identified, Timeout performed, Emergency Drugs available, Suction available and Patient being monitored Patient Re-evaluated:Patient Re-evaluated prior to inductionOxygen Delivery Method: Circle system utilized Preoxygenation: Pre-oxygenation with 100% oxygen Intubation Type: IV induction Ventilation: Mask ventilation without difficulty and Oral airway inserted - appropriate to patient size Laryngoscope Size: Miller and 3 Grade View: Grade I Tube type: Oral Number of attempts: 1 Airway Equipment and Method: Stylet Secured at: 21 cm Tube secured with: Tape Dental Injury: Teeth and Oropharynx as per pre-operative assessment

## 2012-10-19 NOTE — H&P (Signed)
CHIEF COMPLAINT: Back pain. HISTORY OF PRESENT ILLNESS: Whitney Galloway is a 79 year old right-handed individual who has been having problems with back pain, hip pain, leg pain, shoulder pain, neck pain, numbness in the arms, and the legs. This has been going on for several years, but seems to be getting progressively worse. She notes that she has been using some pain medication, but finds that the problem seems to be worsening and overall her weakness and stamina have been deteriorating. She had been worked up in April of this year with an MRI of the lumbar spine and this study is reviewed from [facility]. It demonstrates that the patient has multiple levels of degenerative changes in the discs, most notably at the levels of L3 -4, 4-5, and 5-1, but also to a significant degree at level of the L2-L3. At L3-4, there is a central herniation of the disc which causes a moderate degree of central canal stenosis and some lateral recess stenosis. There are some Modic changes at [in the inferior endplate of the vertebrae, and there is a broad based bulging disc with again lateral recess stenosis at [facility], the patient has been having ongoing pain and worsening difficulties. No cervical spine films have been performed. PAST MEDICAL HISTORY: Notable for hypertension and also she suffers with a hiatal hernia and has some emphysema. She has had a positive TB test in the past. She notes that she has had some colon cancer in the past. PAST SURGICAL HISTORY: Surgeries include a partial laminectomy by Dr. Cyndra Numbers some years ago. ALLERGIES: SHE NOTES NO ALLERGIES TO ANY MEDICATIONS. SOCIAL HISTORY: She does not smoke nor does she use any alcohol. She had some weight gain, but currently states her height and weight at 5 feet 8 inches and 145 pounds. REVIEW OF SYSTEMS: Her systems review is notable for night sweats, wearing of glasses, use of a hearing aid, hearing loss, ear pain, ringing in the ears, balance  disturbance, nasal congestion and drainage, sinus problems, sinus headache, high blood pressure, leg pain while walking, emphysema, shortness of breath, arm weakness, leg weakness, back pain, arm pain, leg pain, arthritis, neck pain, difficulty starting and stopping a urinary stream with some questionable management of bladder control, change in bowel habits, indigestion, pain with eating, difficulty in coordination of arms and legs and depression. These are all noted on her 14-point review sheet. CURRENT MEDICATIONS: Include Caduet, Kionopin, omeprazole, Cymbalta, Bentyl, Turns, omega 3, fatty acid supplements, vitamins, and sustained Ultra for dry eyes. PHYSICAL EXAMINAT Her physical exam at the current time reveals that her pupils are 4 mm, briskly reactive to light and acconnnodation. Extraocular movements are full and the face is symmetric to grimace. Tongue and uvula are in the midline. Upper extremity strength to confrontational testing is intact in the deltoids, biceps, triceps, grips, and intrinsic. Range of motion of the neck reveals that she can turn 45 degrees to either side. She flexes and extends about 50% of normal. Axial compression does not reproduce any pain. Examination of her back reveals that there is tenderness to palpation diffusely across the lumbosacral junction. There is some percussion tenderness in the mid-portion of the lumbar spine. Her ability to flex forward is limited to 45 degrees. She can extend only 10 degrees. Her back moves rather stiffly. Her motor strength in the lower extremities however reveals that the iliopsoas, quad, tibialis anterior, and the gastrocs have good strength to confrontational testing. Deep tendon reflexes are absent in the patellae, trace in the  Achilles, and Babinski's are equivocal bilaterally. Sensation appears diminished in both lower extremities below the levels of the knees. IMPRESSION: The patient has evidence of significant spondylitic changes in  the mid-lumbar spine with a degree of central canal stenosis. I noted that surgical intervention for this process would be substantial and I do not believe that the patient would tolerate it well. I suggested that she may benefit from a translaminar epidural steroid injection in the mid-lumbar spine. We can certainly do this at the level of L2-L3 to see what degree of relief it gives her. If there is not much relief, I do not believe that repeated injections would necessarily be of further benefit. Nonetheless, if this modality provides her with substantial relief, we can repeat it at intervals to see if we can keep her mobilized and keep her ambulating.  Since the above statement the patient has had an epidural steroid injection which gave very modest relief. She notes however that the degree of relief was not worth the cost and effort of the injection. She subsequently underwent an MRI of the lumbar spine  On December 5 she tells me that about a month ago or so she was up in [place], [place] helping a friend cut some trees and she had a fall in the yard. Subsequently, she had significant problems with back pain and leg pain.  She describes the pain into both lower extremities and has pain radiating into the groin on both sides also.    She had an MRI performed on the 30th of November and this study was reviewed in the office.  It demonstrates that Whitney Galloway has a large centrally herniated disc at L3-4 causing severe central canal stenosis at the level of L3-4.  She has had weakness in her legs and difficulty walking. She has previously had epidural steroid injections, but these injections have not helped in the recent past.     I note that she has bilateral iliopsoas weakness, bilateral quadriceps weakness, absence of her patellar reflexes, absence of her Achilles reflexes.  Her distal lower extremity strength appears to be intact however.    IMPRESSION:    The patient has evidence of a large  central disc herniation at L3-4.  She has marked hypertrophy of the facets which contributes to the degree of central canal stenosis at this level.  I indicated to [place] and her daughter, who accompanies her, that she will ultimately need surgical decompression of this process.  Because of the nature of the facet hypertrophy, ultimately more than half of her facets will be sacrificed. Her facets have a rather steep orientation and I do not believe that there is a way to do this surgery without disrupting the integrity of her facet joints bilaterally.  Ultimately, I believe that Tiera will require pedicle screw fixation at L3-4 and posterolateral arthrodesis in addition to posterior interbody arthrodesis.  I indicated the nature of the surgery to her and her daughter.  I indicated that I believe despite her age, her overall health will allow her tolerate this surgery and she should do well with it. She will, however, require a period of some convalescence after surgery, which may take up to about two weeks before she can live and function independently.  This may require a skilled nursing facility during the postoperative period and we will try to make those arrangements after her surgery while she is in the hospital.  I indicated that the surgery, in this situation, is the only  alternative I can offer her.  I would not advise further epidural steroid injections and I do not believe that physical therapy is likely to help with the degree of stenosis that she is experiencing. We will schedule at her earliest convenience.

## 2012-10-19 NOTE — Preoperative (Signed)
Beta Blockers   Reason not to administer Beta Blockers:Not Applicable 

## 2012-10-20 DIAGNOSIS — M5126 Other intervertebral disc displacement, lumbar region: Secondary | ICD-10-CM | POA: Diagnosis present

## 2012-10-20 LAB — CBC
MCV: 84.4 fL (ref 78.0–100.0)
Platelets: 169 10*3/uL (ref 150–400)
RBC: 3.21 MIL/uL — ABNORMAL LOW (ref 3.87–5.11)
WBC: 6.1 10*3/uL (ref 4.0–10.5)

## 2012-10-20 LAB — BASIC METABOLIC PANEL
CO2: 26 mEq/L (ref 19–32)
Chloride: 104 mEq/L (ref 96–112)
Creatinine, Ser: 0.66 mg/dL (ref 0.50–1.10)
Potassium: 3.4 mEq/L — ABNORMAL LOW (ref 3.5–5.1)
Sodium: 138 mEq/L (ref 135–145)

## 2012-10-20 NOTE — Progress Notes (Signed)
Pt was unable to void after removal of the foley, had to do straight cath @ 18:35 pm and emptied 250 ml. Pt tolerated well, no concerns.

## 2012-10-20 NOTE — Progress Notes (Signed)
OT Cancellation Note  Patient Details Name: Whitney Galloway MRN: [id number] DOB: 10-Nov-1928   Cancelled Treatment:    Reason Eval/Treat Not Completed: Fatigue/lethargy limiting ability to participate, pt also with c/o pain 7/10.  RN notified.  Pt asked OT to come back tomorrow.  Will re-attempt  Boykin Reaper [phone] 10/20/2012, 3:16 PM

## 2012-10-20 NOTE — Anesthesia Postprocedure Evaluation (Signed)
  Anesthesia Post-op Note  Patient: Whitney Galloway [facility]  Procedure(s) Performed: Procedure(s) (LRB) with comments: POSTERIOR LUMBAR FUSION 1 LEVEL (Bilateral) - Lumbar three-four Bilateral microdiskectomy, posterior lumbar interbody fusion, peek, pedicle screws  Patient Location: Nursing Unit  Anesthesia Type:General  Level of Consciousness: awake, alert  and oriented  Airway and Oxygen Therapy: Patient Spontanous Breathing  Post-op Pain: mild  Post-op Assessment: Patient's Cardiovascular Status Stable  Post-op Vital Signs: stable  Complications: persistent nausea and vomiting and the night of surgery.  Phenergan helped per patient.

## 2012-10-20 NOTE — Progress Notes (Signed)
Subjective: Patient reports Complains mostly of centralized back pain legs seem to be feeling fair.  Objective: Vital signs in last 24 hours: Temp:  [98 F (36.7 C)-100.4 F (38 C)] 98 F (36.7 C) (01/14 1838) Pulse Rate:  [82-108] 90  (01/14 2129) Resp:  [15-20] 20  (01/14 1838) BP: (107-142)/(31-60) 142/60 mmHg (01/14 2129) SpO2:  [92 %-100 %] 96 % (01/14 1838)  Intake/Output from previous day: 01/13 0701 - 01/14 0700 In: 2500 [I.V.:2150; Blood:100; IV Piggyback:250] Out: 2100 [Urine:1700; Blood:400] Intake/Output this shift: Total I/O In: 3 [I.V.:3] Out: -   Dressing is clean and dry motor function appears intact in major groups including iliopsoas quadriceps tibialis anterior and gastrocs the  Lab Results:  [facility] 10/20/12 0640  WBC 6.1  HGB 9.1*  HCT 27.1*  PLT 169   BMET  Basename 10/20/12 0640  NA 138  K 3.4*  CL 104  CO2 26  GLUCOSE 109*  BUN 7  CREATININE 0.66  CALCIUM 8.1*    Studies/Results: Dg Lumbar Spine 2-3 Views  10/19/2012  *RADIOLOGY REPORT*  Clinical Data: Low back pain.  DG C-ARM 1-60 MIN,LUMBAR SPINE - 2-3 VIEW  Comparison: Multiple priors.  Findings: C-arm films document L3-4 posterolateral and interbody fusion. Satisfactory position and alignment.  IMPRESSION: As above.   Original Report Authenticated By: Davonna Belling, M.D.    Dg Lumbar Spine 2-3 Views  10/19/2012  *RADIOLOGY REPORT*  Clinical Data: Lumbar fusion with L3-L4 surgical level, intraoperative localization  LUMBAR SPINE - 2-3 VIEW  Comparison: Portable intraoperative images correlated with prior MRI of 09/05/2012  Findings: Prior MRI labeled with five lumbar vertebrae. Current cross-table lateral radiographic images are labeled in the same manner.  Image labeled 1200 hours: A metallic probe from dorsal approach projects at the mid L4 level.  Image labeled 1210 hours: A metallic probe via dorsal approach is identified at the level of the superior endplate of L4.  IMPRESSION:  Posterior localization of mid L4 on the first view and the superior endplate of L4 on the second view. Findings called to Dr. Danielle Dess in the OR on 10/19/2012 at 1221 hours.   Original Report Authenticated By: Ulyses Southward, M.D.    Dg C-arm 1-60 Min  10/19/2012  *RADIOLOGY REPORT*  Clinical Data: Low back pain.  DG C-ARM 1-60 MIN,LUMBAR SPINE - 2-3 VIEW  Comparison: Multiple priors.  Findings: C-arm films document L3-4 posterolateral and interbody fusion. Satisfactory position and alignment.  IMPRESSION: As above.   Original Report Authenticated By: Davonna Belling, M.D.     Assessment/Plan: Stable postop day 1 Asian has hemoglobin of 9.1 secondary to acute blood loss anemia from surgery .  LOS: 1 day  Mobilize as patient tolerates.   Clyde Zarrella J 10/20/2012, 10:03 PM

## 2012-10-20 NOTE — Progress Notes (Signed)
INITIAL NUTRITION ASSESSMENT  DOCUMENTATION CODES Per approved criteria  -Not Applicable   INTERVENTION: Magic cup TID between meals, each supplement provides 290 kcal and 9 grams of protein.   NUTRITION DIAGNOSIS: Inadequate oral intake related to decreased appetite as evidenced by weight loss PTA, meal completion < 50%.   Goal: Pt to meet >/= 90% of their estimated nutrition needs.  Monitor:  PO intake, weight, labs  Reason for Assessment: Malnutrition Screening Tool   79 y.o. female  Admitting Dx: Back Pain  ASSESSMENT: Pt is POD # 1 s/p L4-L5 decompressive laminectomy decompression  Pt and daughter report that pt lost about 5 lb over about a 3 month time frame last fall due to pain causing a decreased appetite. Pt reports appetite is some better. Pt does not like ensure.   Height: Ht Readings from Last 1 Encounters:  10/19/12 5\' 8"  (1.727 m)    Weight: Wt Readings from Last 1 Encounters:  10/19/12 134 lb 14.7 oz (61.2 kg)    Ideal Body Weight: 63.6 kg  % Ideal Body Weight: 96%  Wt Readings from Last 10 Encounters:  10/19/12 134 lb 14.7 oz (61.2 kg)  10/19/12 134 lb 14.7 oz (61.2 kg)  10/13/12 135 lb 11.2 oz (61.553 kg)  06/11/12 142 lb 6.4 oz (64.592 kg)  01/25/12 140 lb (63.504 kg)  12/26/11 145 lb 6.4 oz (65.953 kg)  08/15/11 148 lb 6.4 oz (67.314 kg)  07/24/11 146 lb 12.8 oz (66.588 kg)  07/02/11 145 lb 3.2 oz (65.862 kg)  04/16/10 144 lb 12.8 oz (65.681 kg)    Usual Body Weight: 140 lb  % Usual Body Weight: 96%  BMI:  Body mass index is 20.51 kg/(m^2). WNl  Estimated Nutritional Needs: Kcal: 1500-1700 Protein: 70-80 Fluid: >1.5 L/day  Skin: back incision  Diet Order: General Meal Completion: 50%   EDUCATION NEEDS: -No education needs identified at this time   Intake/Output Summary (Last 24 hours) at 10/20/12 0839 Last data filed at 10/20/12 0643  Gross per 24 hour  Intake   2500 ml  Output   2100 ml  Net    400 ml    Last  BM: 1/13   Labs:   Lab 10/20/12 0640 10/13/12 1255  NA 138 138  K 3.4* 4.2  CL 104 100  CO2 26 26  BUN 7 12  CREATININE 0.66 0.71  CALCIUM 8.1* 9.6  MG -- --  PHOS -- --  GLUCOSE 109* 102*    CBG (last 3)  No results found for this basename: GLUCAP:3 in the last 72 hours  Scheduled Meds:   . amLODipine  10 mg Oral Daily  . atorvastatin  20 mg Oral q1800  . LORazepam  1 mg Oral Q8H  . metoprolol succinate  25 mg Oral BID  . pantoprazole  40 mg Oral Daily  . sodium chloride  3 mL Intravenous Q12H  . venlafaxine XR  150 mg Oral Daily    Continuous Infusions:   . sodium chloride 250 mL (10/19/12 1934)  . sodium chloride 75 mL/hr at 10/19/12 1654    Past Medical History  Diagnosis Date  . GERD (gastroesophageal reflux disease)   . HTN (hypertension)   . IBS (irritable bowel syndrome)   . Hernia of unspecified site of abdominal cavity without mention of obstruction or gangrene   . Hiatal hernia   . Diverticulosis   . Gastritis   . Splenic lesion HAMARTOMA  . Hard of hearing   . Colon  polyp 1994 Malignant polyp    2005 NL BE; 2006 incomplete TCS DUE TO REDUNDNACY  . Fibromyalgia   . TB (tuberculosis) AGE 46  . Osteoporosis   . S/P endoscopy 2007, 2009    2007: single distal esophageal erosion, s/p 81 F dilation, no web/ring/stricture noted; 2009: normal, Bravo with adequate suppression on Prevacid BID  . Gastroparesis 2009    65% RETENTION AT 2 HOURS    Past Surgical History  Procedure Date  . Upper gastrointestinal endoscopy 2006  . Benign tumor removed from bilateral breast   . Colonoscopy w/ polypectomy   . Mass removed from roof of mouth   . Cataract surgery   . Lung surgery for pulmonary nodules   . Left wrist cyst removal   . X-stop implantation   . Esophagogastroduodenoscopy 08/27/2011    hiatal hernia/mild gastritis  . Savory dilation 08/27/2011    stricture in the distal esophagus  . Esophagogastroduodenoscopy 05/25/2008     Normal  esophagus without evidence of Barrett mass/  Normal stomach, duodenal bulb, and second portion of the duodenum  . Givens capsule study 05/27/2008    Adequate acid suppression with proton pump inhibitor.  . Esophagogastroduodenoscopy 08/11/2006    Single erosion at distal esophagus and a small sliding hiatal hernia/ No evidence of ring or stricture but esophagus was dilated by passing 56 [place] Maloney dilator/ Prepyloric antral erythema but no evidence of erosions or peptic ulcer disease.  . Esophagogastroduodenoscopy 09/03/2005    Small sliding hiatal hernia without evidence of ring or  stricture formation. Esophagus dilated by passing 54-French [facility] dilator   given history of dysphagia    Kendell Bane RD, LDN, CNSC [phone] Pager [phone] After Hours Pager

## 2012-10-20 NOTE — Progress Notes (Signed)
Physical Therapy Evaluation Note  Past Medical History  Diagnosis Date  . GERD (gastroesophageal reflux disease)   . HTN (hypertension)   . IBS (irritable bowel syndrome)   . Hernia of unspecified site of abdominal cavity without mention of obstruction or gangrene   . Hiatal hernia   . Diverticulosis   . Gastritis   . Splenic lesion HAMARTOMA  . Hard of hearing   . Colon polyp 1994 Malignant polyp    2005 NL BE; 2006 incomplete TCS DUE TO REDUNDNACY  . Fibromyalgia   . TB (tuberculosis) AGE 79  . Osteoporosis   . S/P endoscopy 2007, 2009    2007: single distal esophageal erosion, s/p 23 F dilation, no web/ring/stricture noted; 2009: normal, Bravo with adequate suppression on Prevacid BID  . Gastroparesis 2009    65% RETENTION AT 2 HOURS    Past Surgical History  Procedure Date  . Upper gastrointestinal endoscopy 2006  . Benign tumor removed from bilateral breast   . Colonoscopy w/ polypectomy   . Mass removed from roof of mouth   . Cataract surgery   . Lung surgery for pulmonary nodules   . Left wrist cyst removal   . X-stop implantation   . Esophagogastroduodenoscopy 08/27/2011    hiatal hernia/mild gastritis  . Savory dilation 08/27/2011    stricture in the distal esophagus  . Esophagogastroduodenoscopy 05/25/2008     Normal esophagus without evidence of Barrett mass/  Normal stomach, duodenal bulb, and second portion of the duodenum  . Givens capsule study 05/27/2008    Adequate acid suppression with proton pump inhibitor.  . Esophagogastroduodenoscopy 08/11/2006    Single erosion at distal esophagus and a small sliding hiatal hernia/ No evidence of ring or stricture but esophagus was dilated by passing 56 [place] Maloney dilator/ Prepyloric antral erythema but no evidence of erosions or peptic ulcer disease.  . Esophagogastroduodenoscopy 09/03/2005    Small sliding hiatal hernia without evidence of ring or  stricture formation. Esophagus dilated by passing 54-French  Maloney dilator   given history of dysphagia     10/20/12 0931  PT Visit Information  Last PT Received On 10/20/12  Assistance Needed +1  PT Time Calculation  PT Start Time 0931  PT Stop Time 1001  PT Time Calculation (min) 30 min  Subjective Data  Subjective Pt received supine in bed with daughter present. Pt agreeable to PT.  Precautions  Precautions Back  Precaution Booklet Issued Yes (comment)  Precaution Comments pt with verbal understanding  Required Braces or Orthoses Spinal Brace  Spinal Brace Lumbar corset  Restrictions  Weight Bearing Restrictions No  Home Living  Lives With Alone  Available Help at Discharge Neighbor (dtr reports she can stay with her but pt doesn't want to)  Type of Home House  Home Access Stairs to enter  Entrance Stairs-Number of Steps 4  Entrance Stairs-Rails Can reach both  Home Layout One level  Bathroom Shower/Tub Tub/shower unit;Walk-in Pension scheme manager No  Prior Function  Level of Independence Independent  Able to Take Stairs? Yes  Driving Yes  Vocation Retired  Advertising account planner  Overall Cognitive Status Appears within functional limits for tasks assessed/performed  Arousal/Alertness Awake/alert  Orientation Level Oriented X4 / Intact  Behavior During Session [facility] for tasks performed  Right Upper Extremity Assessment  RUE ROM/Strength/Tone WFL  RUE Sensation WFL - Light Touch  Left Upper Extremity Assessment  LUE ROM/Strength/Tone WFL;WFL for tasks assessed  Right Lower Extremity Assessment  RLE ROM/Strength/Tone [facility]  RLE Sensation (numbness from knee to foot)  Left Lower Extremity Assessment  LLE ROM/Strength/Tone WFL  LLE Sensation (h/o L foot numbness but not currently)  Trunk Assessment  Trunk Assessment Normal  Bed Mobility  Bed Mobility Rolling Left;Left Sidelying to Sit;Sitting - Scoot to Edge of Bed  Rolling Left 4: Min guard;With rail  Left  Sidelying to Sit 4: Min assist;With rails;HOB flat  Sitting - Scoot to [place] of Bed 4: Min assist;With rail  Details for Bed Mobility Assistance increased time but safe  Transfers  Transfers Sit to Stand;Stand to Sit  Sit to Stand 4: Min assist;With upper extremity assist;From bed  Stand to Sit 4: Min assist;To bed;With armrests  Details for Transfer Assistance increased time, v/c's to adhere to precautions  Ambulation/Gait  Ambulation/Gait Assistance 4: Min assist  Ambulation Distance (Feet) 50 Feet  Assistive device Rolling walker  Ambulation/Gait Assistance Details decreased pace, guarded  Gait Pattern Step-to pattern;Decreased stride length  Gait velocity decreased  Stairs No  Balance  Balance Assessed Yes  Static Sitting Balance  Static Sitting - Balance Support No upper extremity supported;Feet supported  Static Sitting - Level of Assistance 5: Stand by assistance  Static Sitting - Comment/# of Minutes 3  Dynamic Sitting Balance  Dynamic Sitting - Balance Support Feet supported;No upper extremity supported  Dynamic Sitting - Level of Assistance 5: Stand by assistance  Dynamic Sitting Balance -  donned lumbar corsett  PT - End of Session  Equipment Utilized During Treatment Gait belt;Back brace  Activity Tolerance Patient tolerated treatment well  Patient left in chair;with call bell/phone within reach;with family/visitor present  Nurse Communication Mobility status  PT Assessment  Clinical Impression Statement Pt is an 79 yo female s/p Lumbar fusion who lived alone and was completely independent prior to surgery. patient now requires assist for mobiltiy and ADLs and is unsafe to return home alone at this time. Patient reports she would rather go to SNF than live with her daughter b/c she doesn't want to burden her. Pt to benefit from ST-SNF placement to achieve independent function to safely transition home alone.  PT Recommendation/Assessment Patient needs continued PT services   PT Problem List Decreased strength;Decreased balance;Decreased mobility  Barriers to Discharge Decreased caregiver support  PT Therapy Diagnosis  Difficulty walking  PT Plan  PT Frequency Min 4X/week  PT Treatment/Interventions DME instruction;Gait training;Stair training;Functional mobility training;Therapeutic activities;Therapeutic exercise;Balance training  PT Recommendation  Follow Up Recommendations SNF;Supervision/Assistance - 24 hour  PT equipment Rolling walker with 5" wheels  Individuals Consulted  Consulted and Agree with Results and Recommendations Family member/caregiver;Patient  Family Member Consulted daughter  Acute Rehab PT Goals  PT Goal Formulation With patient/family  Time For Goal Achievement 10/27/12  Potential to Achieve Goals Good  Pt will Roll Supine to Left Side with modified independence;with rail  PT Goal: Rolling Supine to Left Side - Progress Goal set today  Pt will go Supine/Side to Sit with modified independence;with HOB 0 degrees;with rail  PT Goal: Supine/Side to Sit - Progress Goal set today  Pt will go Sit to Supine/Side with modified independence;with HOB 0 degrees  PT Goal: Sit to Supine/Side - Progress Goal set today  Pt will go Sit to Stand with modified independence;with upper extremity assist (up to RW)  PT Goal: Sit to Stand - Progress Goal set today  Pt will Ambulate >150 feet;with supervision;with rolling walker  PT Goal: Ambulate - Progress Goal set  today  Additional Goals  Additional Goal #1 Pt able to recall 3/3 back precautions and be 100% compliant.  PT Goal: Additional Goal #1 - Progress Goal set today  PT General Charges  $$ ACUTE PT VISIT 1 Procedure  PT Evaluation  $Initial PT Evaluation Tier I 1 Procedure  PT Treatments  $Gait Training 8-22 mins  $Therapeutic Activity 8-22 mins  Written Expression  Dominant Hand Right     Pain: 8/10 back pain, RN provided pain meds prior to PT entering  Lewis Shock, PT, DPT Pager  #: [phone] Office #: [phone]

## 2012-10-20 NOTE — Progress Notes (Signed)
UR COMPLETED  

## 2012-10-21 MED FILL — Heparin Sodium (Porcine) Inj 1000 Unit/ML: INTRAMUSCULAR | Qty: 30 | Status: AC

## 2012-10-21 MED FILL — Sodium Chloride IV Soln 0.9%: INTRAVENOUS | Qty: 1000 | Status: AC

## 2012-10-21 MED FILL — Sodium Chloride Irrigation Soln 0.9%: Qty: 3000 | Status: AC

## 2012-10-21 NOTE — Progress Notes (Signed)
Subjective: Patient reports some back pain  Objective: Vital signs in last 24 hours: Temp:  [97.3 F (36.3 C)-100.1 F (37.8 C)] 97.3 F (36.3 C) (01/15 1441) Pulse Rate:  [72-108] 81  (01/15 1441) Resp:  [18-20] 18  (01/15 1441) BP: (94-142)/(42-60) 103/47 mmHg (01/15 1441) SpO2:  [92 %-98 %] 98 % (01/15 1441)  Intake/Output from previous day: 01/14 0701 - 01/15 0700 In: 3 [I.V.:3] Out: 200 [Urine:200] Intake/Output this shift:    motor fn intact incision clean and dry  Lab Results:  [facility] 10/20/12 0640  WBC 6.1  HGB 9.1*  HCT 27.1*  PLT 169   BMET  Basename 10/20/12 0640  NA 138  K 3.4*  CL 104  CO2 26  GLUCOSE 109*  BUN 7  CREATININE 0.66  CALCIUM 8.1*    Studies/Results: No results found.  Assessment/Plan: Stable   LOS: 2 days  Mobilize as tolerated   Whitney Galloway J 10/21/2012, 5:30 PM

## 2012-10-21 NOTE — Evaluation (Signed)
Occupational Therapy Evaluation Patient Details Name: Whitney Galloway MRN: [id number] DOB: Feb 26, 1929 Today's Date: 10/21/2012 Time: 4098-1191 OT Time Calculation (min): 22 min  OT Assessment / Plan / Recommendation Clinical Impression  This 79 y.o. female admitted for PLIF.  Pt. demonstrates the below listed deficits and will benefit from OT to maximize safety and independence with BADLs to allow pt to return to modified independence after SNF level rehab    OT Assessment  Patient needs continued OT Services    Follow Up Recommendations  SNF    Barriers to Discharge Decreased caregiver support    Equipment Recommendations  None recommended by OT    Recommendations for Other Services    Frequency  Min 2X/week    Precautions / Restrictions Precautions Precautions: Back Precaution Booklet Issued: Yes (comment) Precaution Comments: Pt unable to recall back precautions.  Pt. instructed again Required Braces or Orthoses: Spinal Brace Spinal Brace: Lumbar corset Restrictions Weight Bearing Restrictions: No       ADL  Eating/Feeding: Independent Where Assessed - Eating/Feeding: Chair Grooming: Wash/dry hands;Min guard Where Assessed - Grooming: Supported standing Upper Body Bathing: Supervision/safety Where Assessed - Upper Body Bathing: Other (comment);Unsupported sitting Lower Body Bathing: Moderate assistance Where Assessed - Lower Body Bathing: Supported sit to stand Upper Body Dressing: Minimal assistance Where Assessed - Upper Body Dressing: Unsupported sitting Lower Body Dressing: Moderate assistance Where Assessed - Lower Body Dressing: Supported sit to Pharmacist, hospital: Minimal assistance Statistician Method: Sit to Barista: Comfort height toilet Toileting - Clothing Manipulation and Hygiene: Minimal assistance Where Assessed - Engineer, mining and Hygiene: Standing Equipment Used: Back brace;Rolling  walker Transfers/Ambulation Related to ADLs: Pt ambulates with min A with RW ADL Comments: Pt unable to cross ankles over knees to access feet; she was able to don underpants by dangling them down toward her feet without bending forward.  She will benefit from AE use/instruction.      OT Diagnosis: Generalized weakness;Acute pain  OT Problem List: Decreased strength;Decreased activity tolerance;Impaired balance (sitting and/or standing);Decreased knowledge of use of DME or AE;Decreased knowledge of precautions;Pain OT Treatment Interventions: Self-care/ADL training;DME and/or AE instruction;Therapeutic activities;Patient/family education;Balance training   OT Goals Acute Rehab OT Goals OT Goal Formulation: With patient Time For Goal Achievement: 10/28/12 Potential to Achieve Goals: Good ADL Goals Pt Will Perform Grooming: with supervision;Standing at sink ADL Goal: Grooming - Progress: Goal set today Pt Will Perform Lower Body Dressing: with supervision;with adaptive equipment;Sit to stand from chair;Sit to stand from bed ADL Goal: Lower Body Dressing - Progress: Goal set today Pt Will Transfer to Toilet: with supervision;Ambulation;3-in-1;with DME ADL Goal: Toilet Transfer - Progress: Goal set today Pt Will Perform Toileting - Clothing Manipulation: with supervision;Standing ADL Goal: Toileting - Clothing Manipulation - Progress: Goal set today Pt Will Perform Toileting - Hygiene: with supervision;Sit to stand from 3-in-1/toilet ADL Goal: Toileting - Hygiene - Progress: Goal set today Additional ADL Goal #1: Pt will be independent with back precautions ADL Goal: Additional Goal #1 - Progress: Goal set today  Visit Information  Last OT Received On: 10/21/12 Assistance Needed: +1    Subjective Data  Subjective: "It feels better than yesterday"  re: back and back pain Patient Stated Goal: To regain strength and independence   Prior Functioning     Home Living Lives With:  Alone Available Help at Discharge: Skilled Nursing Facility Type of Home: House Entrance [place] of Steps: 4 Entrance Stairs-Rails: Can reach both Home Layout: One level  Bathroom Shower/Tub: Tub/shower unit;Walk-in shower Bathroom Toilet: Standard Bathroom Accessibility: No Prior Function Level of Independence: Independent Able to Take Stairs?: Yes Driving: Yes Vocation: Retired Musician: HOH Dominant Hand: Right         Vision/Perception     Cognition  Overall Cognitive Status: Appears within functional limits for tasks assessed/performed Arousal/Alertness: Awake/alert Orientation Level: Oriented X4 / Intact Behavior During Session: WFL for tasks performed    Extremity/Trunk Assessment Right Upper Extremity Assessment RUE ROM/Strength/Tone: Within functional levels RUE Sensation: WFL - Light Touch RUE Coordination: WFL - gross/fine motor Left Upper Extremity Assessment LUE ROM/Strength/Tone: Within functional levels;WFL for tasks assessed LUE Sensation: WFL - Light Touch LUE Coordination: WFL - gross/fine motor     Mobility Bed Mobility Bed Mobility: Not assessed Transfers Transfers: Sit to Stand;Stand to Sit Sit to Stand: 4: Min assist;From bed;With upper extremity assist;From toilet Stand to Sit: 4: Min assist;With upper extremity assist;To chair/3-in-1;To toilet Details for Transfer Assistance: Pt requires cues for hand placement, and for back precautions.  Requires increased time to move sit to stand     Shoulder Instructions     Exercise     Balance Balance Balance Assessed: Yes Static Sitting Balance Static Sitting - Balance Support: No upper extremity supported;Feet supported Static Sitting - Level of Assistance: 5: Stand by assistance Dynamic Sitting Balance Dynamic Sitting - Balance Support: Feet supported Dynamic Sitting - Level of Assistance: 5: Stand by assistance Dynamic Sitting Balance - Compensations: don  underpants Dynamic Standing Balance Dynamic Standing - Balance Support: No upper extremity supported Dynamic Standing - Level of Assistance: 4: Min assist Dynamic Standing - Balance Activities: Other (comment) (grooming at sink) Dynamic Standing - Comments: 2 mins   End of Session OT - End of Session Equipment Utilized During Treatment: Back brace Activity Tolerance: Patient tolerated treatment well Patient left: in chair;with call bell/phone within reach;with family/visitor present  GO     Margean Korell M 10/21/2012, 2:49 PM

## 2012-10-21 NOTE — Clinical Social Work Note (Signed)
Clinical Social Work  CSW met with pt and daughter to provide bed offers. The preferred facilities have not responded. CSW contacted those facilities to inquire about bed offer. CSW will continue to follow.  Dede Query, MSW, [facility]ors [phone]

## 2012-10-21 NOTE — Progress Notes (Signed)
Physical Therapy Treatment Patient Details Name: Whitney Galloway MRN: [id number] DOB: 1928-11-20 Today's Date: 10/21/2012 Time: 4098-1191 PT Time Calculation (min): 19 min  PT Assessment / Plan / Recommendation Comments on Treatment Session  Pt con't to be motivated however pain limited ambulation progression this date. Pt lives alone and con't to benefit from SNF to achieve maximial functional recovery prior to returning home alone.    Follow Up Recommendations  SNF;Supervision/Assistance - 24 hour     Does the patient have the potential to tolerate intense rehabilitation     Barriers to Discharge        Equipment Recommendations  Rolling walker with 5" wheels    Recommendations for Other Services    Frequency Min 4X/week   Plan Discharge plan remains appropriate;Frequency remains appropriate    Precautions / Restrictions Precautions Precautions: Back Precaution Booklet Issued: Yes (comment) Precaution Comments: unable to recall back precautions but aware she has a handout on them and asked for it Required Braces or Orthoses: Spinal Brace Spinal Brace: Lumbar corset Restrictions Weight Bearing Restrictions: No   Pertinent Vitals/Pain 8/10 back pain during amb, 0/10 pain in bed    Mobility  Bed Mobility Bed Mobility: Right Sidelying to Sit;Sit to Sidelying Left Right Sidelying to Sit: 4: Min assist;With rails;HOB elevated Sit to Sidelying Left: 3: Mod assist;With rail;HOB flat Details for Bed Mobility Assistance: assist for trunk elevation and LE management when getting back into bed Transfers Transfers: Sit to Stand;Stand to Sit Sit to Stand: 2: Max assist;With upper extremity assist;From bed;From chair/3-in-1 Stand to Sit: 3: Mod assist;With upper extremity assist;With armrests;To chair/3-in-1 Details for Transfer Assistance: pt with increased pain requiring assist to achieve full upright standing. pt with R LE buckling requiring maxA to sit due to inability to  control descent Ambulation/Gait Ambulation/Gait Assistance: 4: Min assist Ambulation Distance (Feet): 60 Feet Assistive device: Rolling walker Ambulation/Gait Assistance Details: pt with R knee buckling and increased pain requiring pt to sit and limiting amb endurance. pt reports "It just hurts so bad." Gait Pattern: Step-to pattern;Decreased stride length Gait velocity: decreased Stairs: No    Exercises     PT Diagnosis:    PT Problem List:   PT Treatment Interventions:     PT Goals Acute Rehab PT Goals PT Goal: Supine/Side to Sit - Progress: Progressing toward goal PT Goal: Sit to Supine/Side - Progress: Progressing toward goal PT Goal: Sit to Stand - Progress: Progressing toward goal PT Goal: Ambulate - Progress: Progressing toward goal Additional Goals PT Goal: Additional Goal #1 - Progress: Progressing toward goal  Visit Information  Last PT Received On: 10/21/12 Assistance Needed: +1    Subjective Data  Subjective: Pt received in R sidelying agreeable to walking. Pt with report "I walked once already today."   Cognition  Overall Cognitive Status: Appears within functional limits for tasks assessed/performed Arousal/Alertness: Awake/alert Orientation Level: Oriented X4 / Intact Behavior During Session: [facility] for tasks performed    Balance  Balance Balance Assessed: Yes Static Sitting Balance Static Sitting - Balance Support: No upper extremity supported;Feet supported Static Sitting - Level of Assistance: 5: Stand by assistance Dynamic Sitting Balance Dynamic Sitting - Balance Support: Feet supported Dynamic Sitting - Level of Assistance: 5: Stand by assistance Dynamic Sitting Balance - Compensations: don underpants Dynamic Standing Balance Dynamic Standing - Balance Support: No upper extremity supported Dynamic Standing - Level of Assistance: 4: Min assist Dynamic Standing - Balance Activities: Other (comment) (grooming at sink) Dynamic Standing - Comments: 2  mins  End of Session PT - End of Session Equipment Utilized During Treatment: Gait belt;Back brace Activity Tolerance: Patient tolerated treatment well Patient left: in bed;with call bell/phone within reach Nurse Communication: Mobility status   GP     Marcene Brawn 10/21/2012, 4:54 PM   Lewis Shock, PT, DPT Pager #: [phone] Office #: [phone]

## 2012-10-22 ENCOUNTER — Inpatient Hospital Stay
Admission: RE | Admit: 2012-10-22 | Discharge: 2012-11-26 | Disposition: A | Discharge status: Home / Self Care | Payer: PRIVATE HEALTH INSURANCE | Source: Ambulatory Visit | Attending: Internal Medicine | Admitting: Internal Medicine

## 2012-10-22 DIAGNOSIS — IMO0002 Reserved for concepts with insufficient information to code with codable children: Secondary | ICD-10-CM | POA: Diagnosis not present

## 2012-10-22 DIAGNOSIS — N048 Nephrotic syndrome with other morphologic changes: Secondary | ICD-10-CM | POA: Diagnosis not present

## 2012-10-22 DIAGNOSIS — M549 Dorsalgia, unspecified: Secondary | ICD-10-CM | POA: Diagnosis not present

## 2012-10-22 DIAGNOSIS — M6281 Muscle weakness (generalized): Secondary | ICD-10-CM | POA: Diagnosis not present

## 2012-10-22 DIAGNOSIS — Q762 Congenital spondylolisthesis: Secondary | ICD-10-CM | POA: Diagnosis not present

## 2012-10-22 DIAGNOSIS — D649 Anemia, unspecified: Secondary | ICD-10-CM | POA: Diagnosis not present

## 2012-10-22 DIAGNOSIS — M5126 Other intervertebral disc displacement, lumbar region: Secondary | ICD-10-CM | POA: Diagnosis not present

## 2012-10-22 DIAGNOSIS — R269 Unspecified abnormalities of gait and mobility: Secondary | ICD-10-CM | POA: Diagnosis not present

## 2012-10-22 DIAGNOSIS — R5383 Other fatigue: Secondary | ICD-10-CM

## 2012-10-22 DIAGNOSIS — M48061 Spinal stenosis, lumbar region without neurogenic claudication: Secondary | ICD-10-CM | POA: Diagnosis not present

## 2012-10-22 DIAGNOSIS — J438 Other emphysema: Secondary | ICD-10-CM | POA: Diagnosis not present

## 2012-10-22 DIAGNOSIS — K5900 Constipation, unspecified: Secondary | ICD-10-CM | POA: Diagnosis not present

## 2012-10-22 DIAGNOSIS — M545 Low back pain, unspecified: Secondary | ICD-10-CM | POA: Diagnosis not present

## 2012-10-22 DIAGNOSIS — G3184 Mild cognitive impairment, so stated: Secondary | ICD-10-CM | POA: Diagnosis not present

## 2012-10-22 DIAGNOSIS — R0602 Shortness of breath: Secondary | ICD-10-CM

## 2012-10-22 DIAGNOSIS — R61 Generalized hyperhidrosis: Principal | ICD-10-CM

## 2012-10-22 DIAGNOSIS — R41841 Cognitive communication deficit: Secondary | ICD-10-CM | POA: Diagnosis not present

## 2012-10-22 DIAGNOSIS — R262 Difficulty in walking, not elsewhere classified: Secondary | ICD-10-CM | POA: Diagnosis not present

## 2012-10-22 DIAGNOSIS — J449 Chronic obstructive pulmonary disease, unspecified: Secondary | ICD-10-CM | POA: Diagnosis not present

## 2012-10-22 DIAGNOSIS — K625 Hemorrhage of anus and rectum: Secondary | ICD-10-CM | POA: Diagnosis not present

## 2012-10-22 MED ORDER — OXYCODONE-ACETAMINOPHEN 5-325 MG PO TABS: 1.0000 | ORAL_TABLET | ORAL | Status: DC | PRN

## 2012-10-22 NOTE — Progress Notes (Signed)
Subjective: Patient reports She's doing okay although very difficult to 20 with her because of her hearing dysfunction but she complains of a lot of back pain some leg pain her legs are significantly improved from preop she denies any headache  Objective: Vital signs in last 24 hours: Temp:  [97.3 F (36.3 C)-99.9 F (37.7 C)] 98.3 F (36.8 C) (01/16 0525) Pulse Rate:  [77-85] 80  (01/16 0525) Resp:  [15-18] 16  (01/16 0525) BP: (94-119)/(40-50) 112/50 mmHg (01/16 0525) SpO2:  [94 %-98 %] 94 % (01/16 0525)  Intake/Output from previous day:   Intake/Output this shift:    Strength is 5 out of 5 wound is clean and dry no signs of drainage or swelling  Lab Results:  [facility] 10/20/12 0640  WBC 6.1  HGB 9.1*  HCT 27.1*  PLT 169   BMET  Basename 10/20/12 0640  NA 138  K 3.4*  CL 104  CO2 26  GLUCOSE 109*  BUN 7  CREATININE 0.66  CALCIUM 8.1*    Studies/Results: No results found.  Assessment/Plan: Continue work with physical outpatient therapy working on place and to a skilled nursing facility  LOS: 3 days     Lavina Resor P 10/22/2012, 8:08 AM

## 2012-10-22 NOTE — Clinical Social Work Note (Signed)
Clinical Social Work   Pt is ready for discharge to [facility]. Facility has received discharge summary and is ready to admit pt. Pt and family are agreeable to discharge plan. [facility] will provide transportation to facility. CSW is signing off as no further needs identified.   Dede Query, MSW, Theresia Majors [phone]

## 2012-10-22 NOTE — Care Management Note (Signed)
    Page 1 of 1   10/22/2012     3:58:17 PM   CARE MANAGEMENT NOTE 10/22/2012  Patient:  Whitney Galloway, Whitney Galloway   Account Number:  [id number]  Date Initiated:  10/20/2012  Documentation initiated by:  [facility]  Subjective/Objective Assessment:   admitted postop L4-5 PLIF     Action/Plan:   PT/OT evals-recommended SNF   Anticipated DC Date:  10/23/2012   Anticipated DC Plan:  SKILLED NURSING FACILITY  In-house referral  Clinical Social Worker      DC Planning Services  CM consult      Choice offered to / List presented to:             Status of service:  Completed, signed off Medicare Important Message given?   (If response is "NO", the following Medicare IM given date fields will be blank) Date Medicare IM given:   Date Additional Medicare IM given:    Discharge Disposition:  SKILLED NURSING FACILITY  Per UR Regulation:  Reviewed for med. necessity/level of care/duration of stay  If discussed at Long Length of Stay Meetings, dates discussed:    Comments:

## 2012-10-22 NOTE — Clinical Social Work Psychosocial (Signed)
     Clinical Social Work Department BRIEF PSYCHOSOCIAL ASSESSMENT 10/22/2012  Patient:  Whitney Galloway, Whitney Galloway     Account Number:  [id number]     Admit date:  10/19/2012  Clinical Social Worker:  Peggyann Shoals  Date/Time:  10/20/2012 05:00 PM  Referred by:  Physician  Date Referred:  10/20/2012 Referred for  SNF Placement   Other Referral:   Interview type:  Family Other interview type:    PSYCHOSOCIAL DATA Living Status:  ALONE Admitted from facility:   Level of care:   Primary support name:  Rocco Serene Primary support relationship to patient:  CHILD, ADULT Degree of support available:   supportive    CURRENT CONCERNS Current Concerns  Post-Acute Placement   Other Concerns:    SOCIAL WORK ASSESSMENT / PLAN CSW met with pt and family to address consult for SNF. CSW introduced herself and explained role of social work.    Pt lives alone and needs SNF at discharge, as recommended by PT. Pt and family would like a facility in [place].    CSW will initiate SNF search for [facility] and follow up with bed offers. CSW will continue to follow.   Assessment/plan status:  Psychosocial Support/Ongoing Assessment of Needs Other assessment/ plan:   Information/referral to community resources:   SNF list    PATIENTS/FAMILYS RESPONSE TO PLAN OF CARE: Pt and family are agreeable to discharge plan.

## 2012-10-22 NOTE — Progress Notes (Signed)
PT Cancellation Note  Patient Details Name: Whitney Galloway MRN: [id number] DOB: 07-01-1929   Cancelled Treatment:     Pt d/cing to SNF.      Verdell Face, [place] [phone] 10/22/2012

## 2012-10-22 NOTE — Clinical Social Work Placement (Signed)
     Clinical Social Work Department CLINICAL SOCIAL WORK PLACEMENT NOTE 10/22/2012  Patient:  AILANIE, RUTTAN  Account Number:  [id number] Admit date:  10/19/2012  Clinical Social Worker:  Peggyann Shoals  Date/time:  10/20/2012 05:00 PM  Clinical Social Work is seeking post-discharge placement for this patient at the following level of care:   SKILLED NURSING   (*CSW will update this form in Epic as items are completed)   10/20/2012  Patient/family provided with [facility] Department of Clinical Social Works list of facilities offering this level of care within the geographic area requested by the patient (or if unable, by the patients family).  10/20/2012  Patient/family informed of their freedom to choose among providers that offer the needed level of care, that participate in Medicare, Medicaid or managed care program needed by the patient, have an available bed and are willing to accept the patient.  10/20/2012  Patient/family informed of [facility] ownership interest in [facility], as well as of the fact that they are under no obligation to receive care at this facility.  PASARR submitted to EDS on 10/20/2012 PASARR number received from EDS on 10/20/2012  FL2 transmitted to all facilities in geographic area requested by pt/family on  10/20/2012 FL2 transmitted to all facilities within larger geographic area on   Patient informed that his/her managed care company has contracts with or will negotiate with  certain facilities, including the following:     Patient/family informed of bed offers received:  10/21/2012 Patient chooses bed at [facility] Physician recommends and patient chooses bed at  [facility]  Patient to be transferred to [facility] on  10/22/2012 Patient to be transferred to facility by [facility]  The following physician request were entered in Epic:   Additional Comments:

## 2012-10-22 NOTE — Discharge Summary (Signed)
  Physician Discharge Summary  Patient ID: Whitney Galloway MRN: [id number] DOB/AGE: 1928-11-05 79 y.o.  Admit date: 10/19/2012 Discharge date: 10/22/2012  Admission Diagnoses:lumbar stenosis L3-4  Discharge Diagnoses: same Principal Problem:  *Herniated nucleus pulposus, L3-4 with stenosis and spondylolisthesis   Discharged Condition: good   Hospital Course: patient was admitted and underwent an L34 laminectomy and fusion.  Post-operatively did very well convalescing on the floor ambulating and voiding.  Radicular pain was improved and back pain was well managed.  She will be stable enough for discharge to a SNF on POD 3 with scheduled follow up with dr. Danielle Dess in 2 weeks.    Consults: Significant Diagnostic Studies Treatments:L34 fusion Discharge Exam: Blood pressure 113/47, pulse 82, temperature 97.8 F (36.6 C), temperature source Oral, resp. rate 18, height 5\' 8"  (1.727 m), weight 61.2 kg (134 lb 14.7 oz), SpO2 98.00%. Awake alert strength 5/5, wound dry  Disposition: SNF     Medication List     As of 10/22/2012 11:42 AM    TAKE these medications         albuterol 108 (90 BASE) MCG/ACT inhaler   Commonly known as: PROVENTIL HFA;VENTOLIN HFA   Inhale 2 puffs into the lungs every 6 (six) hours as needed. For shortness of breath      amlodipine-atorvastatin 10-20 MG per tablet   Commonly known as: CADUET   Take 1 tablet by mouth daily.      calcium carbonate 500 MG chewable tablet   Commonly known as: TUMS - dosed in mg elemental calcium   Chew 1 tablet by mouth as needed. gas      dicyclomine 10 MG capsule   Commonly known as: BENTYL   Take 10 mg by mouth as needed. Stomach pain      fexofenadine 180 MG tablet   Commonly known as: ALLEGRA   Take 180 mg by mouth daily.      fish oil-omega-3 fatty acids 1000 MG capsule   Take 1 g by mouth daily.      LORazepam 1 MG tablet   Commonly known as: ATIVAN   Take 1 mg by mouth every 8 (eight) hours.     metoprolol succinate 25 MG 24 hr tablet   Commonly known as: TOPROL-XL   Take 25 mg by mouth 2 (two) times daily.      multivitamin tablet   Take 1 tablet by mouth daily.      multivitamin-lutein Caps   Take 1 capsule by mouth 2 (two) times daily.      omeprazole 20 MG capsule   Commonly known as: PRILOSEC   Take 20 mg by mouth 2 (two) times daily. 1 po bid 30 MINUTES PRIOR TO YOUR MEALS      UNABLE TO FIND   Place 1 drop into both eyes 2 (two) times daily. Refresh eye drops      venlafaxine XR 150 MG 24 hr capsule   Commonly known as: EFFEXOR-XR   Take 150 mg by mouth daily.         Signed: Lakesia Dahle P 10/22/2012, 11:42 AM

## 2012-10-23 ENCOUNTER — Ambulatory Visit (HOSPITAL_COMMUNITY)
Admit: 2012-10-23 | Discharge: 2012-10-23 | Disposition: A | Discharge status: Home / Self Care | Payer: Medicare Other | Attending: Internal Medicine | Admitting: Internal Medicine

## 2012-10-23 DIAGNOSIS — R05 Cough: Secondary | ICD-10-CM | POA: Insufficient documentation

## 2012-10-23 DIAGNOSIS — R0602 Shortness of breath: Secondary | ICD-10-CM | POA: Insufficient documentation

## 2012-10-23 DIAGNOSIS — J438 Other emphysema: Secondary | ICD-10-CM | POA: Diagnosis not present

## 2012-10-23 DIAGNOSIS — R61 Generalized hyperhidrosis: Secondary | ICD-10-CM | POA: Insufficient documentation

## 2012-10-23 DIAGNOSIS — D649 Anemia, unspecified: Secondary | ICD-10-CM | POA: Diagnosis not present

## 2012-10-23 DIAGNOSIS — R059 Cough, unspecified: Secondary | ICD-10-CM | POA: Insufficient documentation

## 2012-10-23 DIAGNOSIS — N048 Nephrotic syndrome with other morphologic changes: Secondary | ICD-10-CM | POA: Diagnosis not present

## 2012-10-25 ENCOUNTER — Ambulatory Visit (HOSPITAL_COMMUNITY): Payer: Medicare Other | Attending: Internal Medicine

## 2012-10-25 DIAGNOSIS — K5900 Constipation, unspecified: Secondary | ICD-10-CM | POA: Diagnosis not present

## 2012-10-26 DIAGNOSIS — R269 Unspecified abnormalities of gait and mobility: Secondary | ICD-10-CM | POA: Diagnosis not present

## 2012-10-26 DIAGNOSIS — IMO0002 Reserved for concepts with insufficient information to code with codable children: Secondary | ICD-10-CM | POA: Diagnosis not present

## 2012-10-26 DIAGNOSIS — J449 Chronic obstructive pulmonary disease, unspecified: Secondary | ICD-10-CM | POA: Diagnosis not present

## 2012-10-28 ENCOUNTER — Encounter (HOSPITAL_COMMUNITY): Payer: Self-pay

## 2012-10-29 DIAGNOSIS — K625 Hemorrhage of anus and rectum: Secondary | ICD-10-CM | POA: Diagnosis not present

## 2012-11-05 DIAGNOSIS — M5126 Other intervertebral disc displacement, lumbar region: Secondary | ICD-10-CM | POA: Diagnosis not present

## 2012-11-26 DIAGNOSIS — IMO0002 Reserved for concepts with insufficient information to code with codable children: Secondary | ICD-10-CM | POA: Diagnosis not present

## 2012-11-27 DIAGNOSIS — Z4789 Encounter for other orthopedic aftercare: Secondary | ICD-10-CM | POA: Diagnosis not present

## 2012-11-27 DIAGNOSIS — IMO0001 Reserved for inherently not codable concepts without codable children: Secondary | ICD-10-CM | POA: Diagnosis not present

## 2012-11-27 DIAGNOSIS — I10 Essential (primary) hypertension: Secondary | ICD-10-CM | POA: Diagnosis not present

## 2012-12-01 DIAGNOSIS — R112 Nausea with vomiting, unspecified: Secondary | ICD-10-CM | POA: Diagnosis not present

## 2012-12-01 DIAGNOSIS — I10 Essential (primary) hypertension: Secondary | ICD-10-CM | POA: Diagnosis not present

## 2012-12-01 DIAGNOSIS — E876 Hypokalemia: Secondary | ICD-10-CM | POA: Diagnosis not present

## 2012-12-01 DIAGNOSIS — R110 Nausea: Secondary | ICD-10-CM | POA: Diagnosis not present

## 2012-12-01 DIAGNOSIS — D649 Anemia, unspecified: Secondary | ICD-10-CM | POA: Diagnosis not present

## 2012-12-02 DIAGNOSIS — IMO0001 Reserved for inherently not codable concepts without codable children: Secondary | ICD-10-CM | POA: Diagnosis not present

## 2012-12-02 DIAGNOSIS — Z4789 Encounter for other orthopedic aftercare: Secondary | ICD-10-CM | POA: Diagnosis not present

## 2012-12-02 DIAGNOSIS — I10 Essential (primary) hypertension: Secondary | ICD-10-CM | POA: Diagnosis not present

## 2012-12-03 DIAGNOSIS — M5126 Other intervertebral disc displacement, lumbar region: Secondary | ICD-10-CM | POA: Diagnosis not present

## 2012-12-04 DIAGNOSIS — I10 Essential (primary) hypertension: Secondary | ICD-10-CM | POA: Diagnosis not present

## 2012-12-04 DIAGNOSIS — Z4789 Encounter for other orthopedic aftercare: Secondary | ICD-10-CM | POA: Diagnosis not present

## 2012-12-04 DIAGNOSIS — IMO0001 Reserved for inherently not codable concepts without codable children: Secondary | ICD-10-CM | POA: Diagnosis not present

## 2012-12-07 DIAGNOSIS — Z4789 Encounter for other orthopedic aftercare: Secondary | ICD-10-CM | POA: Diagnosis not present

## 2012-12-07 DIAGNOSIS — IMO0001 Reserved for inherently not codable concepts without codable children: Secondary | ICD-10-CM | POA: Diagnosis not present

## 2012-12-07 DIAGNOSIS — I10 Essential (primary) hypertension: Secondary | ICD-10-CM | POA: Diagnosis not present

## 2012-12-10 DIAGNOSIS — I10 Essential (primary) hypertension: Secondary | ICD-10-CM | POA: Diagnosis not present

## 2012-12-10 DIAGNOSIS — IMO0001 Reserved for inherently not codable concepts without codable children: Secondary | ICD-10-CM | POA: Diagnosis not present

## 2012-12-10 DIAGNOSIS — Z4789 Encounter for other orthopedic aftercare: Secondary | ICD-10-CM | POA: Diagnosis not present

## 2012-12-14 DIAGNOSIS — Z4789 Encounter for other orthopedic aftercare: Secondary | ICD-10-CM | POA: Diagnosis not present

## 2012-12-14 DIAGNOSIS — IMO0001 Reserved for inherently not codable concepts without codable children: Secondary | ICD-10-CM | POA: Diagnosis not present

## 2012-12-14 DIAGNOSIS — I10 Essential (primary) hypertension: Secondary | ICD-10-CM | POA: Diagnosis not present

## 2012-12-16 DIAGNOSIS — Z4789 Encounter for other orthopedic aftercare: Secondary | ICD-10-CM | POA: Diagnosis not present

## 2012-12-16 DIAGNOSIS — I10 Essential (primary) hypertension: Secondary | ICD-10-CM | POA: Diagnosis not present

## 2012-12-16 DIAGNOSIS — IMO0001 Reserved for inherently not codable concepts without codable children: Secondary | ICD-10-CM | POA: Diagnosis not present

## 2012-12-17 ENCOUNTER — Telehealth: Payer: Self-pay

## 2012-12-17 DIAGNOSIS — Z9889 Other specified postprocedural states: Secondary | ICD-10-CM | POA: Insufficient documentation

## 2012-12-17 DIAGNOSIS — Z01811 Encounter for preprocedural respiratory examination: Secondary | ICD-10-CM | POA: Diagnosis not present

## 2012-12-17 DIAGNOSIS — I498 Other specified cardiac arrhythmias: Secondary | ICD-10-CM | POA: Diagnosis not present

## 2012-12-17 DIAGNOSIS — H9190 Unspecified hearing loss, unspecified ear: Secondary | ICD-10-CM | POA: Insufficient documentation

## 2012-12-17 DIAGNOSIS — D649 Anemia, unspecified: Secondary | ICD-10-CM | POA: Diagnosis not present

## 2012-12-17 DIAGNOSIS — I10 Essential (primary) hypertension: Secondary | ICD-10-CM | POA: Diagnosis not present

## 2012-12-17 DIAGNOSIS — R0609 Other forms of dyspnea: Secondary | ICD-10-CM | POA: Diagnosis not present

## 2012-12-17 DIAGNOSIS — Z87891 Personal history of nicotine dependence: Secondary | ICD-10-CM | POA: Diagnosis not present

## 2012-12-17 NOTE — Telephone Encounter (Signed)
INSTRUCT PT TO DO THE FOLLOWING TO CHANGE HER PREP: 1. 2 DAYS BEFORE HER PREP FULL LIQUID DIET ONLY. TAKE MIRALAX 1 SCOOP IN 8 OZ LIQUID AT 0800, 1200, AND 6P. DRINK 8 OZ OF WATER 30 MINS AFTER EACH DOSE. 2. 1 DAY BEFORE HER PREP CLEAR LIQUID DIET ONLY. START MIRALAX  AT 12N. TAKE DULCOLAX 10 MG AT 3 PM AND REPEAT AT 5PM.   Full Liquid Diet A high-calorie, high-protein supplement should be used to meet your nutritional requirements when the full liquid diet is continued for more than 2 or 3 days. If this diet is to be used for an extended period of time (more than 7 days), a multivitamin should be considered.  Breads and Starches  Allowed: None are allowed except crackersWHOLE OR pureed (made into a thick, smooth soup) in soup. Cooked, refined corn, oat, rice, rye, and wheat cereals are also allowed.   Avoid: Any others.    Potatoes/Pasta/Rice  Allowed: ANY ITEM AS A SOUP OR SMALL PLATE OF MASHED POTATOES OR RICE.       Vegetables  Allowed: Strained tomato or vegetable juice. Vegetables pureed in soup.   Avoid: Any others.    Fruit  Allowed: Any strained fruit juices and fruit drinks. Include 1 serving of citrus or vitamin C-enriched fruit juice daily.   Avoid: Any others.  Meat and Meat Substitutes  Allowed: Egg  Avoid: Any meat, fish, or fowl. All cheese.  Milk  Allowed: Milk beverages, including milk shakes and instant breakfast mixes. Smooth yogurt.   Avoid: Any others. Avoid dairy products if not tolerated.    Soups and Combination Foods  Allowed: Broth, strained cream soups. Strained, broth-based soups.   Avoid: Any others.    Desserts and Sweets  Allowed: flavored gelatin, tapioca, plain  ice cream, sherbet, smooth pudding, junket, fruit ices, frozen ice pops, pudding pops,, frozen fudge pops, chocolate syrup. Sugar, honey, jelly, syrup.   Avoid: Any others.  Fats and Oils  Allowed: Margarine, butter, cream, sour cream, oils.   Avoid: Any others.   Beverages  Allowed: All.   Avoid: None.  Condiments  Allowed: Iodized salt, pepper, spices, flavorings. Cocoa powder.   Avoid: Any others.    SAMPLE MEAL PLAN Breakfast   cup orange juice.   1 cup cooked wheat cereal.   1 cup milk.   1 cup beverage (coffee or tea).   Cream or sugar, if desired.    Midmorning Snack  2 SCRAMBLED OR HARD BOILED EGG   Lunch  1 cup cream soup.    cup fruit juice.   1 cup  milk.    cup custard.   1 cup beverage (coffee or tea).   Cream or sugar, if desired.    Midafternoon Snack  1 cup VANILLA SOY milk shake.  Dinner  1 cup cream soup.    cup fruit juice.   1 cup milk.    cup pudding.   1 cup beverage (coffee or tea).   Cream or sugar, if desired.  Evening Snack  1 cup supplement.  To increase calories, add sugar, cream, butter, or margarine if possible. Nutritional supplements will also increase the total calories.

## 2012-12-17 NOTE — Telephone Encounter (Signed)
Pt is having a TCS at [facility] and she does not like the prep they gave her. It is the miralax and stool softer's. She would like for you to change her prep. Please advise. Her daughter(Pam) can be reached at [phone].

## 2012-12-17 NOTE — Telephone Encounter (Signed)
Pt's daughter is going to come by and pick the instruction.

## 2012-12-21 DIAGNOSIS — Z4789 Encounter for other orthopedic aftercare: Secondary | ICD-10-CM | POA: Diagnosis not present

## 2012-12-21 DIAGNOSIS — I10 Essential (primary) hypertension: Secondary | ICD-10-CM | POA: Diagnosis not present

## 2012-12-21 DIAGNOSIS — IMO0001 Reserved for inherently not codable concepts without codable children: Secondary | ICD-10-CM | POA: Diagnosis not present

## 2012-12-23 ENCOUNTER — Encounter: Payer: Self-pay | Admitting: Gastroenterology

## 2012-12-24 DIAGNOSIS — K6289 Other specified diseases of anus and rectum: Secondary | ICD-10-CM | POA: Diagnosis not present

## 2012-12-24 DIAGNOSIS — K5732 Diverticulitis of large intestine without perforation or abscess without bleeding: Secondary | ICD-10-CM | POA: Diagnosis not present

## 2012-12-24 DIAGNOSIS — K5730 Diverticulosis of large intestine without perforation or abscess without bleeding: Secondary | ICD-10-CM | POA: Diagnosis not present

## 2012-12-24 DIAGNOSIS — K625 Hemorrhage of anus and rectum: Secondary | ICD-10-CM | POA: Diagnosis not present

## 2012-12-25 DIAGNOSIS — Z4789 Encounter for other orthopedic aftercare: Secondary | ICD-10-CM | POA: Diagnosis not present

## 2012-12-25 DIAGNOSIS — I10 Essential (primary) hypertension: Secondary | ICD-10-CM | POA: Diagnosis not present

## 2012-12-25 DIAGNOSIS — IMO0001 Reserved for inherently not codable concepts without codable children: Secondary | ICD-10-CM | POA: Diagnosis not present

## 2012-12-30 ENCOUNTER — Telehealth: Payer: Self-pay | Admitting: Gastroenterology

## 2012-12-30 NOTE — Telephone Encounter (Signed)
PLEASE OBTAIN RECORDS AND PLACE IN MY SEAT.   CALL DAUGHTER AND LET HER KNOW ONCE I REVIEW THE RECORDS FROM [facility] THEN I WILL BE ABLE TO ANSWER HER QUESTIONS.

## 2012-12-30 NOTE — Telephone Encounter (Signed)
Pt's daughter called to say that pt had her tcs last week at [facility] and now the doctor has ordered an analrectomy(?) and daughter isn't understanding what this is or why the patient needs it and was wondering if SF could explain it to her so she can explain it to her mother. This was done at [facility] by Dr Gwinda Passe. I told her that we haven't received anything from [facility] and nothing had been noted in her chart at this time about what she is talking about. Please call Elita Quick (daughter) to advise [phone]

## 2012-12-31 DIAGNOSIS — M5126 Other intervertebral disc displacement, lumbar region: Secondary | ICD-10-CM | POA: Diagnosis not present

## 2012-12-31 NOTE — Telephone Encounter (Signed)
Whitney Galloway, LMOM that Dr. Darrick Penna would review the records and she could then answer her questions. ( the report on Dr. Darrick Penna chair).

## 2013-01-03 NOTE — Telephone Encounter (Addendum)
REVIEWED RECORDS FROM [facility]. CALLED PT'S DAUGHTER. EXPLAINED ANORECTAL MANOMETRY. MOM ALREADY HAS PROBLEMS CONTROLLING HER BOWELS. HAS HAD PROBLEMS SINE SHE HAD HER BACK SURGERY. GAVE NAME OF PROCEDURE AND ASKED THEM TO REVIEW THE PROCEDURE ON YOU TUBE. EXPLAINED THAT THERE WOULD BE VERY LITTLE WE COULD DO TO IMPROVE HER ANAL SPHINCTER TONE. OPTIONS WOULD INCLUDE COLOSTOMY IF LOSS CONTROL OF HER BOWELS IMPAIRS HER QUALITY OF LIFE.  ?PT HAS DECLINED REFERRAL FOR ARM IN THE PAST.

## 2013-01-26 DIAGNOSIS — H52229 Regular astigmatism, unspecified eye: Secondary | ICD-10-CM | POA: Diagnosis not present

## 2013-01-26 DIAGNOSIS — H5200 Hypermetropia, unspecified eye: Secondary | ICD-10-CM | POA: Diagnosis not present

## 2013-01-26 DIAGNOSIS — H35369 Drusen (degenerative) of macula, unspecified eye: Secondary | ICD-10-CM | POA: Diagnosis not present

## 2013-01-26 DIAGNOSIS — Z961 Presence of intraocular lens: Secondary | ICD-10-CM | POA: Diagnosis not present

## 2013-02-03 ENCOUNTER — Encounter: Payer: Self-pay | Admitting: Gastroenterology

## 2013-02-03 ENCOUNTER — Other Ambulatory Visit: Payer: Self-pay | Admitting: General Practice

## 2013-02-03 ENCOUNTER — Ambulatory Visit (INDEPENDENT_AMBULATORY_CARE_PROVIDER_SITE_OTHER): Payer: Medicare Other | Admitting: Gastroenterology

## 2013-02-03 VITALS — BP 105/58 | HR 65 | Temp 98.0°F | Ht 68.0 in | Wt 140.8 lb

## 2013-02-03 DIAGNOSIS — R159 Full incontinence of feces: Secondary | ICD-10-CM

## 2013-02-03 NOTE — Progress Notes (Signed)
Open in error

## 2013-02-03 NOTE — Progress Notes (Signed)
Subjective:    Patient ID: Whitney Galloway, female    DOB: 1929/03/09, 79 y.o.   MRN: [id number]  PCP: HALL  HPI PT CONCERNED ABOUT WATERY STOOL LEAKING FROM HER RECTUM. PT PACKS TOILET PAPER IN HER BOTTOM AND WEARS A PAD. NO RECTAL PAIN//ITCHING. IF SHE TAKES STOOL SOFTENER/LETTUCE SHE HAS LOOSE STOOLS. NO TAKING FIBER. IF TAKE MORE THAN ONE STOOL SOFTENER, HELPS HER STOOLS BE REGULAR(SOFT/HARD). IF IT'S HARD MAY HAVE TO GO TO BR 3-4 TIMES IN A DAY. OCCASIONAL LOWER ABD PAIN.  PT DENIES FEVER, CHILLS, BRBPR, nausea, vomiting, melena, abd pain, problems swallowing, heartburn or indigestion.  Past Medical History  Diagnosis Date  . GERD (gastroesophageal reflux disease)   . HTN (hypertension)   . IBS (irritable bowel syndrome)   . Hernia of unspecified site of abdominal cavity without mention of obstruction or gangrene   . Hiatal hernia   . Diverticulosis   . Gastritis   . Splenic lesion HAMARTOMA  . Hard of hearing   . Colon polyp 1994 Malignant polyp    2005 NL BE; 2006 incomplete TCS DUE TO REDUNDNACY  . Fibromyalgia   . TB (tuberculosis) AGE 60  . Osteoporosis   . S/P endoscopy 2007, 2009    2007: single distal esophageal erosion, s/p 45 F dilation, no web/ring/stricture noted; 2009: normal, Bravo with adequate suppression on Prevacid BID  . Gastroparesis 2009    65% RETENTION AT 2 HOURS   Past Surgical History  Procedure Laterality Date  . Upper gastrointestinal endoscopy  2006  . Benign tumor removed from bilateral breast    . Colonoscopy w/ polypectomy    . Mass removed from roof of mouth    . Cataract surgery    . Lung surgery for pulmonary nodules    . Left wrist cyst removal    . X-stop implantation    . Esophagogastroduodenoscopy  08/27/2011    hiatal hernia/mild gastritis  . Savory dilation  08/27/2011    stricture in the distal esophagus  . Esophagogastroduodenoscopy  05/25/2008     Normal esophagus without evidence of Barrett mass/  Normal stomach,  duodenal bulb, and second portion of the duodenum  . Givens capsule study  05/27/2008    Adequate acid suppression with proton pump inhibitor.  . Esophagogastroduodenoscopy  08/11/2006    Single erosion at distal esophagus and a small sliding hiatal hernia/ No evidence of ring or stricture but esophagus was dilated by passing 56 [place] Maloney dilator/ Prepyloric antral erythema but no evidence of erosions or peptic ulcer disease.  . Esophagogastroduodenoscopy  09/03/2005    Small sliding hiatal hernia without evidence of ring or  stricture formation. Esophagus dilated by passing 54-French Maloney dilator   given history of dysphagia   No Known Allergies  Current Outpatient Prescriptions  Medication Sig Dispense Refill  . albuterol (PROVENTIL HFA;VENTOLIN HFA) 108 (90 BASE) MCG/ACT inhaler Inhale 2 puffs into the lungs every 6 (six) hours as needed. For shortness of breath      . calcium carbonate (TUMS - DOSED IN MG ELEMENTAL CALCIUM) 500 MG chewable tablet Chew 1 tablet by mouth as needed. gas      . dicyclomine (BENTYL) 10 MG capsule Take 10 mg by mouth as needed. Stomach pain      . fexofenadine (ALLEGRA) 180 MG tablet Take 180 mg by mouth daily.      . fish oil-omega-3 fatty acids 1000 MG capsule Take 1 g by mouth daily.        [name]  LORazepam (ATIVAN) 1 MG tablet Take 1 mg by mouth every 8 (eight) hours.      . metoprolol succinate (TOPROL-XL) 25 MG 24 hr tablet Take 25 mg by mouth 2 (two) times daily.       . Multiple Vitamin (MULTIVITAMIN) tablet Take 1 tablet by mouth daily.        . multivitamin-lutein (OCUVITE-LUTEIN) CAPS Take 1 capsule by mouth 2 (two) times daily.      [name] omeprazole (PRILOSEC) 20 MG capsule Take 20 mg by mouth 2 (two) times daily. 1 po bid 30 MINUTES PRIOR TO YOUR MEALS      . oxyCODONE-acetaminophen (PERCOCET/ROXICET) 5-325 MG per tablet Take 1-2 tablets by mouth every 4 (four) hours as needed.  60 tablet  0  . UNABLE TO FIND Place 1 drop into both eyes 2 (two) times  daily. Refresh eye drops      . venlafaxine XR (EFFEXOR-XR) 150 MG 24 hr capsule Take 150 mg by mouth daily.          Review of Systems     Objective:   Physical Exam  Vitals reviewed. Constitutional: She is oriented to person, place, and time. She appears well-nourished. No distress.  HENT:  Head: Normocephalic and atraumatic.  Mouth/Throat: Oropharynx is clear and moist. No oropharyngeal exudate.  Eyes: Pupils are equal, round, and reactive to light. No scleral icterus.  Neck: Normal range of motion. Neck supple.  Cardiovascular: Normal rate, regular rhythm and normal heart sounds.   Pulmonary/Chest: Effort normal and breath sounds normal. No respiratory distress.  Abdominal: Soft. Bowel sounds are normal. She exhibits no distension. There is no tenderness.  Musculoskeletal: She exhibits no edema.  Neurological: She is alert and oriented to person, place, and time.  NO  NEW FOCAL DEFICITS  PT HOH  Psychiatric: She has a normal mood and affect.   RECTUM: NON-TENDER EXAM. PROLAPSED INTERNAL HEMORRHOIDS WITH VALSALVA.       Assessment & Plan:

## 2013-02-03 NOTE — Progress Notes (Signed)
Cc PCP 

## 2013-02-03 NOTE — Assessment & Plan Note (Signed)
SX NOT WELL CONTROLLED DUE TO HEMORRHOIDS AND A WEAK ANAL SPHINCTER.  DISCUSSED MANAGEMENT OPTIONS WITH PT AND DAUGHTER. PLAN FOR [facility] +/- SOLESTA INJECTIONS. INFORMATION GIVEN ON SOLESTA AND [facility]. PT WILL WAIT UNTIL SHE RETURNS FROM [place] IN LATE AUG TO SET UP [facility]. OPV SEP 2014 WITH PLANS FOR FIRST [facility]

## 2013-02-03 NOTE — Patient Instructions (Addendum)
GIVE ME A CALL IN LATE AUGUST TO SET UP APPT FOR FIRST HEMORRHOID BANDING.

## 2013-02-04 DIAGNOSIS — L723 Sebaceous cyst: Secondary | ICD-10-CM | POA: Diagnosis not present

## 2013-02-04 DIAGNOSIS — L821 Other seborrheic keratosis: Secondary | ICD-10-CM | POA: Diagnosis not present

## 2013-02-04 DIAGNOSIS — L570 Actinic keratosis: Secondary | ICD-10-CM | POA: Diagnosis not present

## 2013-02-04 DIAGNOSIS — L578 Other skin changes due to chronic exposure to nonionizing radiation: Secondary | ICD-10-CM | POA: Diagnosis not present

## 2013-02-04 NOTE — Progress Notes (Signed)
NIC for Aug

## 2013-02-05 NOTE — Progress Notes (Signed)
Reminder in epic °

## 2013-03-11 DIAGNOSIS — M545 Low back pain, unspecified: Secondary | ICD-10-CM | POA: Diagnosis not present

## 2013-03-15 DIAGNOSIS — IMO0002 Reserved for concepts with insufficient information to code with codable children: Secondary | ICD-10-CM | POA: Diagnosis not present

## 2013-03-15 DIAGNOSIS — M47817 Spondylosis without myelopathy or radiculopathy, lumbosacral region: Secondary | ICD-10-CM | POA: Diagnosis not present

## 2013-07-07 DIAGNOSIS — H43819 Vitreous degeneration, unspecified eye: Secondary | ICD-10-CM | POA: Diagnosis not present

## 2013-07-07 DIAGNOSIS — H35369 Drusen (degenerative) of macula, unspecified eye: Secondary | ICD-10-CM | POA: Diagnosis not present

## 2013-07-22 ENCOUNTER — Telehealth: Payer: Self-pay | Admitting: Gastroenterology

## 2013-07-22 ENCOUNTER — Encounter (INDEPENDENT_AMBULATORY_CARE_PROVIDER_SITE_OTHER): Payer: Medicare Other | Admitting: Gastroenterology

## 2013-07-22 NOTE — Progress Notes (Signed)
ERROR PT A NO SHOW

## 2013-07-22 NOTE — Telephone Encounter (Signed)
REVIEWED.  

## 2013-07-22 NOTE — Telephone Encounter (Signed)
PT WAS A NO SHOW 

## 2013-07-22 NOTE — Progress Notes (Deleted)
  Subjective:    Patient ID: Whitney Galloway, female    DOB: [birth year], 79 y.o.   MRN: [id number]  Catalina Pizza, MD  HPI    Review of Systems     Objective:   Physical Exam        Assessment & Plan:

## 2013-08-23 DIAGNOSIS — Z23 Encounter for immunization: Secondary | ICD-10-CM | POA: Diagnosis not present

## 2013-08-25 ENCOUNTER — Ambulatory Visit: Payer: Medicare Other | Admitting: Gastroenterology

## 2013-08-25 DIAGNOSIS — IMO0002 Reserved for concepts with insufficient information to code with codable children: Secondary | ICD-10-CM | POA: Diagnosis not present

## 2013-08-31 ENCOUNTER — Other Ambulatory Visit (HOSPITAL_COMMUNITY): Payer: Self-pay | Admitting: Neurological Surgery

## 2013-08-31 DIAGNOSIS — M5416 Radiculopathy, lumbar region: Secondary | ICD-10-CM

## 2013-09-06 ENCOUNTER — Encounter (HOSPITAL_COMMUNITY): Payer: Self-pay

## 2013-09-06 ENCOUNTER — Ambulatory Visit (HOSPITAL_COMMUNITY)
Admission: RE | Admit: 2013-09-06 | Discharge: 2013-09-06 | Disposition: A | Discharge status: Home / Self Care | Payer: Medicare Other | Source: Ambulatory Visit | Attending: Neurological Surgery | Admitting: Neurological Surgery

## 2013-09-06 DIAGNOSIS — R29890 Loss of height: Secondary | ICD-10-CM | POA: Insufficient documentation

## 2013-09-06 DIAGNOSIS — M545 Low back pain, unspecified: Secondary | ICD-10-CM | POA: Insufficient documentation

## 2013-09-06 DIAGNOSIS — M5126 Other intervertebral disc displacement, lumbar region: Secondary | ICD-10-CM | POA: Diagnosis not present

## 2013-09-06 DIAGNOSIS — M51379 Other intervertebral disc degeneration, lumbosacral region without mention of lumbar back pain or lower extremity pain: Secondary | ICD-10-CM | POA: Insufficient documentation

## 2013-09-06 DIAGNOSIS — M48061 Spinal stenosis, lumbar region without neurogenic claudication: Secondary | ICD-10-CM | POA: Insufficient documentation

## 2013-09-06 DIAGNOSIS — R209 Unspecified disturbances of skin sensation: Secondary | ICD-10-CM | POA: Diagnosis not present

## 2013-09-06 DIAGNOSIS — M5416 Radiculopathy, lumbar region: Secondary | ICD-10-CM

## 2013-09-06 DIAGNOSIS — M5137 Other intervertebral disc degeneration, lumbosacral region: Secondary | ICD-10-CM | POA: Diagnosis not present

## 2013-09-07 DIAGNOSIS — M47817 Spondylosis without myelopathy or radiculopathy, lumbosacral region: Secondary | ICD-10-CM | POA: Diagnosis not present

## 2013-09-09 ENCOUNTER — Other Ambulatory Visit: Payer: Self-pay

## 2013-09-09 ENCOUNTER — Encounter: Payer: Self-pay | Admitting: Gastroenterology

## 2013-09-09 ENCOUNTER — Encounter (INDEPENDENT_AMBULATORY_CARE_PROVIDER_SITE_OTHER): Payer: Self-pay

## 2013-09-09 ENCOUNTER — Ambulatory Visit (INDEPENDENT_AMBULATORY_CARE_PROVIDER_SITE_OTHER): Payer: Medicare Other | Admitting: Gastroenterology

## 2013-09-09 VITALS — BP 114/66 | HR 65 | Temp 97.1°F | Ht 68.0 in | Wt 148.2 lb

## 2013-09-09 DIAGNOSIS — K625 Hemorrhage of anus and rectum: Secondary | ICD-10-CM

## 2013-09-09 DIAGNOSIS — R159 Full incontinence of feces: Secondary | ICD-10-CM

## 2013-09-09 DIAGNOSIS — K589 Irritable bowel syndrome without diarrhea: Secondary | ICD-10-CM | POA: Diagnosis not present

## 2013-09-09 DIAGNOSIS — D649 Anemia, unspecified: Secondary | ICD-10-CM | POA: Diagnosis not present

## 2013-09-09 LAB — CBC WITH DIFFERENTIAL/PLATELET
Eosinophils Relative: 2 % (ref 0–5)
HCT: 37.2 % (ref 36.0–46.0)
Hemoglobin: 12.7 g/dL (ref 12.0–15.0)
Lymphocytes Relative: 36 % (ref 12–46)
Lymphs Abs: 2.2 10*3/uL (ref 0.7–4.0)
MCV: 85.1 fL (ref 78.0–100.0)
Monocytes Absolute: 0.5 10*3/uL (ref 0.1–1.0)
RBC: 4.37 MIL/uL (ref 3.87–5.11)
WBC: 6.1 10*3/uL (ref 4.0–10.5)

## 2013-09-09 NOTE — Assessment & Plan Note (Signed)
SX NOT IDEALLY CONTROLLED.  VIEWED/REVIEWED PT TESTIMONY/SOLESTA PROCEDURE VIDEO WITH PT AND DAUGHTER. ANSWERED QUESTIONS. PT CONCERNED SHE WON'T BE ABLE TO PASS STOOL AFTER SOLESTA. OPV IN 1 MO TO SCHEDULE INJECTIONS IF PT AGREES.

## 2013-09-09 NOTE — Assessment & Plan Note (Signed)
MIXED PATTERN IN SETTING OF REDUNDANT COLON. SX NOT IDEALLY CONTROLLED MOST LIKELY DUE TO ANATOMY/PELVIC FLOOR DYSFUNCTION.  ADD AMITIZA. SAMPLES GIVEN. STOP IRON ADD FIBER OPV IN 1 MO

## 2013-09-09 NOTE — Progress Notes (Signed)
Subjective:    Patient ID: Whitney Galloway, female    DOB: 12/13/1928, 79 y.o.   MRN: [id number] Catalina Pizza, MD  HPI TAKING BENTYL ONCE A MO WHEN SHE HAS CRAMPS. HAVIN TO STAY IN BR. HAS NO CONTROL OVER HER BOWELS. GOES IN THE BR AND LETS IT DRAIN OUT WHEN IT WILL AND WHEN IT DOESN'T. GETS IT OUT WITH A GLOVE. STAYS BLOATED ALL THE TIME. EATING A LOT OF FROZEN FOOD. MAKES PRETTY GOOD SOUP. COOKS FOR COMPANY. STILL TAKING IRON ONCE A DAY. NOT TAKING PAIN MEDS. GIVEN INFO ON SOLESTA LAST TIME. PT NOT INTERESTED AT THAT TIME. SOFT STOOL COMES OUT BUT IT'S DIFFICULT TO PASS STOOL. Has DIFFICULTY CONTROLLING SOLID,LOSE, AND WATERY STOOLS.  Past Medical History  Diagnosis Date  . GERD (gastroesophageal reflux disease)   . HTN (hypertension)   . IBS (irritable bowel syndrome)   . Hernia of unspecified site of abdominal cavity without mention of obstruction or gangrene   . Hiatal hernia   . Diverticulosis   . Gastritis   . Splenic lesion HAMARTOMA  . Hard of hearing   . Colon polyp 1994 Malignant polyp    2005 NL BE; 2006 incomplete TCS DUE TO REDUNDNACY  . Fibromyalgia   . TB (tuberculosis) AGE 84  . Osteoporosis   . S/P endoscopy 2007, 2009    2007: single distal esophageal erosion, s/p 33 F dilation, no web/ring/stricture noted; 2009: normal, Bravo with adequate suppression on Prevacid BID  . Gastroparesis 2009    65% RETENTION AT 2 HOURS    Past Surgical History  Procedure Laterality Date  . Upper gastrointestinal endoscopy  2006  . Benign tumor removed from bilateral breast    . Colonoscopy w/ polypectomy    . Mass removed from roof of mouth    . Cataract surgery    . Lung surgery for pulmonary nodules    . Left wrist cyst removal    . X-stop implantation    . Esophagogastroduodenoscopy  08/27/2011    hiatal hernia/mild gastritis  . Savory dilation  08/27/2011    stricture in the distal esophagus  . Esophagogastroduodenoscopy  05/25/2008     Normal esophagus without  evidence of Barrett mass/  Normal stomach, duodenal bulb, and second portion of the duodenum  . Givens capsule study  05/27/2008    Adequate acid suppression with proton pump inhibitor.  . Esophagogastroduodenoscopy  08/11/2006    Single erosion at distal esophagus and a small sliding hiatal hernia/ No evidence of ring or stricture but esophagus was dilated by passing 56 [place] Maloney dilator/ Prepyloric antral erythema but no evidence of erosions or peptic ulcer disease.  . Esophagogastroduodenoscopy  09/03/2005    Small sliding hiatal hernia without evidence of ring or  stricture formation. Esophagus dilated by passing 54-French Maloney dilator   given history of dysphagia   No Known Allergies  Current Outpatient Prescriptions  Medication Sig Dispense Refill  . amLODipine (NORVASC) 10 MG tablet Take 10 mg by mouth daily.      [name] atorvastatin (LIPITOR) 20 MG tablet Take 20 mg by mouth daily.      . calcium carbonate (TUMS - DOSED IN MG ELEMENTAL CALCIUM) 500 MG chewable tablet Chew 1 tablet by mouth as needed. gas      . dicyclomine (BENTYL) 10 MG capsule Take 10 mg by mouth as needed. Stomach pain      . ferrous sulfate 325 (65 FE) MG tablet Take 325 mg by mouth daily  with breakfast.      . fish oil-omega-3 fatty acids 1000 MG capsule Take 1 g by mouth daily.        [name] LORazepam (ATIVAN) 1 MG tablet Take 1 mg by mouth every 8 (eight) hours.      . metoprolol tartrate (LOPRESSOR) 25 MG tablet Take 25 mg by mouth 2 (two) times daily.      . multivitamin-lutein (OCUVITE-LUTEIN) CAPS Take 1 capsule by mouth 2 (two) times daily.      . NON FORMULARY Caduet  20 mg   One daily      . omeprazole (PRILOSEC) 20 MG capsule Take 20 mg by mouth 2 (two) times daily. 1 po bid 30 MINUTES PRIOR TO YOUR MEALS      . pseudoephedrine-guaifenesin (MUCINEX D) 60-600 MG per tablet Take 1 tablet by mouth every 12 (twelve) hours.      [name] UNABLE TO FIND Place 1 drop into both eyes 2 (two) times daily. Refresh eye  drops      . venlafaxine XR (EFFEXOR-XR) 150 MG 24 hr capsule Take 150 mg by mouth daily.      [name] albuterol (PROVENTIL HFA;VENTOLIN HFA) 108 (90 BASE) MCG/ACT inhaler Inhale 2 puffs into the lungs every 6 (six) hours as needed. For shortness of breath      . fexofenadine (ALLEGRA) 180 MG tablet Take 180 mg by mouth daily.      [name] oxyCODONE-acetaminophen (PERCOCET/ROXICET) 5-325 MG per tablet Take 1-2 tablets by mouth every 4 (four) hours as needed.         Review of Systems PER HPI OTHERWISE ALL SYSTEMS ARE NEGATIVE.     Objective:   Physical Exam  Vitals reviewed. Constitutional: She is oriented to person, place, and time. She appears well-nourished. No distress.  HENT:  Head: Normocephalic and atraumatic.  Mouth/Throat: Oropharynx is clear and moist. No oropharyngeal exudate.  Eyes: Pupils are equal, round, and reactive to light. No scleral icterus.  Neck: Normal range of motion. Neck supple.  Cardiovascular: Normal rate, regular rhythm and normal heart sounds.   Pulmonary/Chest: Effort normal and breath sounds normal. No respiratory distress.  Abdominal: Soft. Bowel sounds are normal. She exhibits no distension. There is no tenderness.  Musculoskeletal: She exhibits no edema.  Lymphadenopathy:    She has no cervical adenopathy.  Neurological: She is alert and oriented to person, place, and time.  NO  NEW FOCAL DEFICITS   Psychiatric: She has a normal mood and affect.          Assessment & Plan:

## 2013-09-09 NOTE — Patient Instructions (Addendum)
DRINK WATER TO KEEP YOUR URINE LIGHT YELLOW.  USE FIBER POWDER OR 1 PACKET ONCE DAILY FOR 3 DAYS THEN TWICE DAILY FOR 3 DAYS THEN THREE TIMES A DAY. AVOID HIGHER DOSES IF IT CAUSES BLOATING & GAS.  FOLLOW A HIGH FIBER/DAIRY FREE DIET. SEE INFO BELOW.  STOP TAKING IRON.  ADD AMITIZA ONE PILL AT BEDTIME FOR 7 DAYS THEN ONE TWICE DAILY WITH FOOD.  IT MAY CAUSE NAUSEA.  CALL IN 2 WEEKS IF BOWEL MOVEMENTS NOT IDEALLY PASSING OR CONTROLLED.   FOLLOW UP IN 1 MO.  WE WILL DISCUSS THE SOLESTA INJECTIONS.   High-Fiber Diet A high-fiber diet changes your normal diet to include more whole grains, legumes, fruits, and vegetables. Changes in the diet involve replacing refined carbohydrates with unrefined foods. The calorie level of the diet is essentially unchanged. The Dietary Reference Intake (recommended amount) for adult males is 38 grams per day. For adult females, it is 25 grams per day. Pregnant and lactating women should consume 28 grams of fiber per day. Fiber is the intact part of a plant that is not broken down during digestion. Functional fiber is fiber that has been isolated from the plant to provide a beneficial effect in the body. PURPOSE  Increase stool bulk.   Ease and regulate bowel movements.   Lower cholesterol.  INDICATIONS THAT YOU NEED MORE FIBER  Constipation and hemorrhoids.   Uncomplicated diverticulosis (intestine condition) and irritable bowel syndrome.   Weight management.   As a protective measure against hardening of the arteries (atherosclerosis), diabetes, and cancer.   GUIDELINES FOR INCREASING FIBER IN THE DIET  Start adding fiber to the diet slowly. A gradual increase of about 5 more grams (2 slices of whole-wheat bread, 2 servings of most fruits or vegetables, or 1 bowl of high-fiber cereal) per day is best. Too rapid an increase in fiber may result in constipation, flatulence, and bloating.   Drink enough water and fluids to keep your urine clear or pale  yellow. Water, juice, or caffeine-free drinks are recommended. Not drinking enough fluid may cause constipation.   Eat a variety of high-fiber foods rather than one type of fiber.   Try to increase your intake of fiber through using high-fiber foods rather than fiber pills or supplements that contain small amounts of fiber.   The goal is to change the types of food eaten. Do not supplement your present diet with high-fiber foods, but replace foods in your present diet.  INCLUDE A VARIETY OF FIBER SOURCES  Replace refined and processed grains with whole grains, canned fruits with fresh fruits, and incorporate other fiber sources. White rice, white breads, and most bakery goods contain little or no fiber.   Brown whole-grain rice, buckwheat oats, and many fruits and vegetables are all good sources of fiber. These include: broccoli, Brussels sprouts, cabbage, cauliflower, beets, sweet potatoes, white potatoes (skin on), carrots, tomatoes, eggplant, squash, berries, fresh fruits, and dried fruits.   Cereals appear to be the richest source of fiber. Cereal fiber is found in whole grains and bran. Bran is the fiber-rich outer coat of cereal grain, which is largely removed in refining. In whole-grain cereals, the bran remains. In breakfast cereals, the largest amount of fiber is found in those with "bran" in their names. The fiber content is sometimes indicated on the label.   You may need to include additional fruits and vegetables each day.   In baking, for 1 cup white flour, you may use the following substitutions:  1 cup whole-wheat flour minus 2 tablespoons.   1/2 cup white flour plus 1/2 cup whole-wheat flour.

## 2013-09-09 NOTE — Addendum Note (Signed)
Addended by: [place] on: 09/09/2013 03:01 PM   Modules accepted: Orders

## 2013-09-09 NOTE — Progress Notes (Signed)
Opened in Error.

## 2013-09-09 NOTE — Assessment & Plan Note (Signed)
TAKING ONE DAILY. OCCASIONAL RECTAL BLEEDING. GETS TIRED IF SHE DOESN'T TAKE IT.  CBC TODAY STOP IRON MAY NEED IVFE

## 2013-09-13 NOTE — Progress Notes (Signed)
Pt is aware of OV on 1/8 at 10 with SF

## 2013-09-13 NOTE — Progress Notes (Signed)
Called and informed pt's daughter.

## 2013-09-13 NOTE — Progress Notes (Signed)
PLEASE CALL PT. HER BLOOD COUNT IS NORMAL. HOLD IRON. WILL ASSESS NEED AT NEXT VISIT.

## 2013-09-13 NOTE — Progress Notes (Signed)
cc'd to pcp 

## 2013-09-24 ENCOUNTER — Telehealth: Payer: Self-pay

## 2013-09-24 NOTE — Telephone Encounter (Signed)
REVIEWED.  

## 2013-09-24 NOTE — Telephone Encounter (Signed)
pts daughterThornton Park [phone] with progress report on pt. She started taking Amitiza and it seems to be helping some. Still has some some problems getting the stool out.

## 2013-10-14 ENCOUNTER — Encounter: Payer: Self-pay | Admitting: Gastroenterology

## 2013-10-14 ENCOUNTER — Ambulatory Visit (INDEPENDENT_AMBULATORY_CARE_PROVIDER_SITE_OTHER): Payer: Medicare Other | Admitting: Gastroenterology

## 2013-10-14 ENCOUNTER — Encounter (INDEPENDENT_AMBULATORY_CARE_PROVIDER_SITE_OTHER): Payer: Self-pay

## 2013-10-14 VITALS — BP 110/59 | HR 67 | Temp 98.5°F | Wt 145.8 lb

## 2013-10-14 DIAGNOSIS — K589 Irritable bowel syndrome without diarrhea: Secondary | ICD-10-CM

## 2013-10-14 DIAGNOSIS — K648 Other hemorrhoids: Secondary | ICD-10-CM | POA: Insufficient documentation

## 2013-10-14 DIAGNOSIS — R159 Full incontinence of feces: Secondary | ICD-10-CM | POA: Diagnosis not present

## 2013-10-14 MED ORDER — LUBIPROSTONE 24 MCG PO CAPS: ORAL_CAPSULE | ORAL | Status: DC

## 2013-10-14 NOTE — Progress Notes (Signed)
cc'd to pcp 

## 2013-10-14 NOTE — Patient Instructions (Addendum)
FOLLOW A HIGH FIBER DIET. AVOID ITEMS THAT CAUSE BLOATING AND GAS. SEE INFO BELOW.  CONTINUE AMITIZA.  DRINK WATER TO KEEP YOUR URINE LIGHT YELLOW.  SIT FOR LESS THAN 5 MINUTES ON THE COMMODE AT A TIME.  FOLLOW UP IN 2-3 WEEKS.    High-Fiber Diet A high-fiber diet changes your normal diet to include more whole grains, legumes, fruits, and vegetables. Changes in the diet involve replacing refined carbohydrates with unrefined foods. The calorie level of the diet is essentially unchanged. The Dietary Reference Intake (recommended amount) for adult males is 38 grams per day. For adult females, it is 25 grams per day. Pregnant and lactating women should consume 28 grams of fiber per day. Fiber is the intact part of a plant that is not broken down during digestion. Functional fiber is fiber that has been isolated from the plant to provide a beneficial effect in the body. PURPOSE  Increase stool bulk.   Ease and regulate bowel movements.   Lower cholesterol.  INDICATIONS THAT YOU NEED MORE FIBER  Constipation and hemorrhoids.   Uncomplicated diverticulosis (intestine condition) and irritable bowel syndrome.   Weight management.   As a protective measure against hardening of the arteries (atherosclerosis), diabetes, and cancer.   DO NOT USE WITH:  Acute diverticulitis (intestine infection).   Partial small bowel obstructions.   Complicated diverticular disease involving bleeding, rupture (perforation), or abscess (boil, furuncle).   Presence of autonomic neuropathy (nerve damage) or gastroparesis (stomach cannot empty itself).    GUIDELINES FOR INCREASING FIBER IN THE DIET  Start adding fiber to the diet slowly. A gradual increase of about 5 more grams (2 slices of whole-wheat bread, 2 servings of most fruits or vegetables, or 1 bowl of high-fiber cereal) per day is best. Too rapid an increase in fiber may result in constipation, flatulence, and bloating.   Drink enough water  and fluids to keep your urine clear or pale yellow. Water, juice, or caffeine-free drinks are recommended. Not drinking enough fluid may cause constipation.   Eat a variety of high-fiber foods rather than one type of fiber.   Try to increase your intake of fiber through using high-fiber foods rather than fiber pills or supplements that contain small amounts of fiber.   The goal is to change the types of food eaten. Do not supplement your present diet with high-fiber foods, but replace foods in your present diet.    INCLUDE A VARIETY OF FIBER SOURCES  Replace refined and processed grains with whole grains, canned fruits with fresh fruits, and incorporate other fiber sources. White rice, white breads, and most bakery goods contain little or no fiber.   Brown whole-grain rice, buckwheat oats, and many fruits and vegetables are all good sources of fiber. These include: broccoli, Brussels sprouts, cabbage, cauliflower, beets, sweet potatoes, white potatoes (skin on), carrots, tomatoes, eggplant, squash, berries, fresh fruits, and dried fruits.   Cereals appear to be the richest source of fiber. Cereal fiber is found in whole grains and bran. Bran is the fiber-rich outer coat of cereal grain, which is largely removed in refining. In whole-grain cereals, the bran remains. In breakfast cereals, the largest amount of fiber is found in those with "bran" in their names. The fiber content is sometimes indicated on the label.   You may need to include additional fruits and vegetables each day.   In baking, for 1 cup white flour, you may use the following substitutions:   1 cup whole-wheat flour minus 2  tablespoons.   1/2 cup white flour plus 1/2 cup whole-wheat flour.

## 2013-10-14 NOTE — Assessment & Plan Note (Addendum)
Most likely due to pelvic floor dysfunction AND EXACERBATED BY INTERNAL HEMORRHOIDS.  [place] BANDING TODAY. CONSIDER SOLESTA IF SX NOT RESOLVED AFTER BANDING COMPLETE. DISCUSSED WITH DAUGHTER.

## 2013-10-14 NOTE — Assessment & Plan Note (Signed)
[place].  CONTINUE AMITIZA. OPV  JAN 28.

## 2013-10-14 NOTE — Progress Notes (Signed)
  PROCEDURE TECHNIQUE: BENEFITS RISK EXPLAINED TO PT. ANOSCOPY PERFORMED. BULGING INTERNAL HEMORRHOID COLUMN IN THE R POSTERIOR(LARGE), LEFT LATERAL, AND ANTERIOR COLUMNS. ONE [facility] BAND PLACED IN RIGHT POSTERIOR POSITION. POST-BANDING RECTAL EXAM REVEALED GOOD PLACEMENT. EXAM NON-TENDER.

## 2013-10-14 NOTE — Progress Notes (Addendum)
Subjective:    Patient ID: Whitney Galloway, female    DOB: 19-Feb-1929, 80 y.o.   MRN: [id number]  Delphina Cahill, MD  HPI HAS PAIN IN HER HEMORRHOIDS WHEN SHE SITS ON TOILET. SEEING RECTAL BLEEDING EVERY 3-4 DAYS. TOOK AMITIZA AND IT HELPED FOR 2 WEEKS. STOPPED IRON. BMS BETTER.  Past Medical History  Diagnosis Date  . GERD (gastroesophageal reflux disease)   . HTN (hypertension)   . IBS (irritable bowel syndrome)   . Hernia of unspecified site of abdominal cavity without mention of obstruction or gangrene   . Hiatal hernia   . Diverticulosis   . Gastritis   . Splenic lesion HAMARTOMA  . Hard of hearing   . Colon polyp 1994 Malignant polyp    2005 NL BE; 2006 incomplete TCS DUE TO REDUNDNACY  . Fibromyalgia   . TB (tuberculosis) AGE 15  . Osteoporosis   . S/P endoscopy 2007, 2009    2007: single distal esophageal erosion, s/p 23 F dilation, no web/ring/stricture noted; 2009: normal, Bravo with adequate suppression on Prevacid BID  . Gastroparesis 2009    65% RETENTION AT 2 HOURS   Past Surgical History  Procedure Laterality Date  . Upper gastrointestinal endoscopy  2006  . Benign tumor removed from bilateral breast    . Colonoscopy w/ polypectomy    . Mass removed from roof of mouth    . Cataract surgery    . Lung surgery for pulmonary nodules    . Left wrist cyst removal    . X-stop implantation    . Esophagogastroduodenoscopy  08/27/2011    hiatal hernia/mild gastritis  . Savory dilation  08/27/2011    stricture in the distal esophagus  . Esophagogastroduodenoscopy  05/25/2008     Normal esophagus without evidence of Barrett mass/  Normal stomach, duodenal bulb, and second portion of the duodenum  . Givens capsule study  05/27/2008    Adequate acid suppression with proton pump inhibitor.  . Esophagogastroduodenoscopy  08/11/2006    Single erosion at distal esophagus and a small sliding hiatal hernia/ No evidence of ring or stricture but esophagus was dilated by  passing 96 [place] Maloney dilator/ Prepyloric antral erythema but no evidence of erosions or peptic ulcer disease.  . Esophagogastroduodenoscopy  09/03/2005    Small sliding hiatal hernia without evidence of ring or  stricture formation. Esophagus dilated by passing 54-French Maloney dilator   given history of dysphagia   No Known Allergies  Current Outpatient Prescriptions  Medication Sig Dispense Refill  . albuterol (PROVENTIL HFA;VENTOLIN HFA) 108 (90 BASE) MCG/ACT inhaler Inhale 2 puffs into the lungs every 6 (six) hours as needed. For shortness of breath      . amLODipine (NORVASC) 10 MG tablet Take 10 mg by mouth daily.      [name] atorvastatin (LIPITOR) 20 MG tablet Take 20 mg by mouth daily.      . calcium carbonate (TUMS - DOSED IN MG ELEMENTAL CALCIUM) 500 MG chewable tablet Chew 1 tablet by mouth as needed. gas      . dicyclomine (BENTYL) 10 MG capsule Take 10 mg by mouth as needed. Stomach pain      . ferrous sulfate 325 (65 FE) MG tablet Take 325 mg by mouth daily with breakfast.      . fexofenadine (ALLEGRA) 180 MG tablet Take 180 mg by mouth daily.      . fish oil-omega-3 fatty acids 1000 MG capsule Take 1 g by mouth daily.        [name]  LORazepam (ATIVAN) 1 MG tablet Take 1 mg by mouth every 8 (eight) hours.      . metoprolol tartrate (LOPRESSOR) 25 MG tablet Take 25 mg by mouth 2 (two) times daily.      . multivitamin-lutein (OCUVITE-LUTEIN) CAPS Take 1 capsule by mouth 2 (two) times daily.      . NON FORMULARY Caduet  20 mg   One daily      . omeprazole (PRILOSEC) 20 MG capsule Take 20 mg by mouth 2 (two) times daily. 1 po bid 30 MINUTES PRIOR TO YOUR MEALS      . oxyCODONE-acetaminophen (PERCOCET/ROXICET) 5-325 MG per tablet Take 1-2 tablets by mouth every 4 (four) hours as needed.  60 tablet  0  . pseudoephedrine-guaifenesin (MUCINEX D) 60-600 MG per tablet Take 1 tablet by mouth every 12 (twelve) hours.      [name] UNABLE TO FIND Place 1 drop into both eyes 2 (two) times daily. Refresh  eye drops      . venlafaxine XR (EFFEXOR-XR) 150 MG 24 hr capsule Take 150 mg by mouth daily.          Review of Systems     Objective:   Physical Exam  Vitals reviewed. Constitutional: She is oriented to person, place, and time. She appears well-nourished. No distress.  HENT:  Head: Normocephalic and atraumatic.  Mouth/Throat: Oropharynx is clear and moist. No oropharyngeal exudate.  Eyes: Pupils are equal, round, and reactive to light. No scleral icterus.  Neck: Normal range of motion. Neck supple.  Cardiovascular: Normal rate, regular rhythm and normal heart sounds.   Pulmonary/Chest: Effort normal and breath sounds normal. No respiratory distress.  Abdominal: Soft. Bowel sounds are normal. She exhibits no distension. There is no tenderness.  Musculoskeletal: She exhibits no edema.  Lymphadenopathy:    She has no cervical adenopathy.  Neurological: She is alert and oriented to person, place, and time.  NO  NEW FOCAL DEFICITS. HARD OF HEARING    Psychiatric: She has a normal mood and affect.          Assessment & Plan:

## 2013-10-14 NOTE — Addendum Note (Signed)
Addended by: Danie Binder on: 10/14/2013 11:41 AM   Modules accepted: Orders, Level of Service

## 2013-10-14 NOTE — Assessment & Plan Note (Addendum)
[place] BAND IN 2 WEEKS

## 2013-10-18 ENCOUNTER — Telehealth: Payer: Self-pay | Admitting: Gastroenterology

## 2013-10-18 DIAGNOSIS — L570 Actinic keratosis: Secondary | ICD-10-CM | POA: Diagnosis not present

## 2013-10-18 DIAGNOSIS — L578 Other skin changes due to chronic exposure to nonionizing radiation: Secondary | ICD-10-CM | POA: Diagnosis not present

## 2013-10-18 DIAGNOSIS — L219 Seborrheic dermatitis, unspecified: Secondary | ICD-10-CM | POA: Diagnosis not present

## 2013-10-18 NOTE — Telephone Encounter (Signed)
Pt's daughter called to say that we called in a 31 day supply of Amitiza and Insurance will only cover for a 14 day supply and asked if we would call it in again so she can pick it up this afternoon while they are out. She uses [facility] in [place].

## 2013-10-18 NOTE — Telephone Encounter (Signed)
Routing to refill. 

## 2013-10-19 MED ORDER — LUBIPROSTONE 24 MCG PO CAPS: ORAL_CAPSULE | ORAL | Status: DC

## 2013-10-19 NOTE — Telephone Encounter (Signed)
Ginger, would you check this out for me today, since I am out of the office. Thanks!

## 2013-10-19 NOTE — Addendum Note (Signed)
Addended by: Mahala Menghini on: 10/19/2013 01:44 PM   Modules accepted: Orders

## 2013-10-19 NOTE — Telephone Encounter (Signed)
Per Ginger, Patient needs 14 day supply while waiting on mail order. rx done.

## 2013-10-19 NOTE — Telephone Encounter (Signed)
Please check on this. Looks like [facility] sent 90 capsules (45 day supply)  instead of 60 (30 day supply). I don't see why they should not cover month supply.

## 2013-10-19 NOTE — Telephone Encounter (Signed)
forward

## 2013-11-01 DIAGNOSIS — M545 Low back pain, unspecified: Secondary | ICD-10-CM | POA: Diagnosis not present

## 2013-11-01 DIAGNOSIS — IMO0002 Reserved for concepts with insufficient information to code with codable children: Secondary | ICD-10-CM | POA: Diagnosis not present

## 2013-11-01 DIAGNOSIS — M47817 Spondylosis without myelopathy or radiculopathy, lumbosacral region: Secondary | ICD-10-CM | POA: Diagnosis not present

## 2013-11-03 ENCOUNTER — Encounter: Payer: Medicare Other | Admitting: Gastroenterology

## 2013-11-11 ENCOUNTER — Ambulatory Visit (INDEPENDENT_AMBULATORY_CARE_PROVIDER_SITE_OTHER): Payer: Medicare Other | Admitting: Gastroenterology

## 2013-11-11 ENCOUNTER — Encounter: Payer: Self-pay | Admitting: Gastroenterology

## 2013-11-11 ENCOUNTER — Encounter (INDEPENDENT_AMBULATORY_CARE_PROVIDER_SITE_OTHER): Payer: Self-pay

## 2013-11-11 VITALS — BP 118/63 | HR 68 | Temp 98.3°F | Wt 145.2 lb

## 2013-11-11 DIAGNOSIS — K648 Other hemorrhoids: Secondary | ICD-10-CM | POA: Diagnosis not present

## 2013-11-11 DIAGNOSIS — R159 Full incontinence of feces: Secondary | ICD-10-CM

## 2013-11-11 NOTE — Assessment & Plan Note (Signed)
R ANT BAND IN 2 WEEKS

## 2013-11-11 NOTE — Progress Notes (Signed)
SYMPTOMS: RECTAL BLEEDING-AFTER PILLS EVERY DAY. BLOOD WITHIN MUCOUS HAS TERRIBLE ODOR. Has it packed back there. AMITIZA PILLS MAKE HER SICK. IF SHE EATS VEGETABLES SHE HAS A GOOD BM.  ADDITIONAL QUESTIONS:  LATEX ALLERGY: NO PREGNANT: NO ERECTILE DYSFUNCTION MEDS OR NITRATES: NO ANTICOAGULATION/ANTIPLATELET MEDS: NO DIAGNOSED WITH CROHN'S DISEASE, PROCTITIS, PORTAL HTN, OR ANAL/RECTAL CA: NO TAKING IMMUNOSUPPRESSANTS/XRT: NO  PLAN: 1. [facility] BANDING TODAY   PROCEDURE TECHNIQUE: BENEFITS RISK EXPLAINED TO PT.  ONE [facility] BAND PLACED IN RIGHT POSTERIOR POSITION. POST-BANDING RECTAL EXAM REVEALED GOOD PLACEMENT. EXAM NON-TENDER.

## 2013-11-11 NOTE — Patient Instructions (Signed)
FOLLOW A HIGH FIBER DIET. AVOID ITEMS THAT CAUSE BLOATING AND GAS. SEE INFO BELOW.  DRINK WATER TO KEEP YOUR URINE LIGHT YELLOW.  STOP USING AMITIZA.  SIT FOR LESS THAN 5 MINUTES ON THE COMMODE.  FOLLOW UP IN 2 WEEKS.    High-Fiber Diet A high-fiber diet changes your normal diet to include more whole grains, legumes, fruits, and vegetables. Changes in the diet involve replacing refined carbohydrates with unrefined foods. The calorie level of the diet is essentially unchanged. The Dietary Reference Intake (recommended amount) for adult males is 38 grams per day. For adult females, it is 25 grams per day. Pregnant and lactating women should consume 28 grams of fiber per day. Fiber is the intact part of a plant that is not broken down during digestion. Functional fiber is fiber that has been isolated from the plant to provide a beneficial effect in the body. PURPOSE  Increase stool bulk.   Ease and regulate bowel movements.   Lower cholesterol.  INDICATIONS THAT YOU NEED MORE FIBER  Constipation and hemorrhoids.   Uncomplicated diverticulosis (intestine condition) and irritable bowel syndrome.   Weight management.   As a protective measure against hardening of the arteries (atherosclerosis), diabetes, and cancer.   DO NOT USE WITH:  Acute diverticulitis (intestine infection).   Partial small bowel obstructions.   Complicated diverticular disease involving bleeding, rupture (perforation), or abscess (boil, furuncle).   Presence of autonomic neuropathy (nerve damage) or gastroparesis (stomach cannot empty itself).    GUIDELINES FOR INCREASING FIBER IN THE DIET  Start adding fiber to the diet slowly. A gradual increase of about 5 more grams (2 slices of whole-wheat bread, 2 servings of most fruits or vegetables, or 1 bowl of high-fiber cereal) per day is best. Too rapid an increase in fiber may result in constipation, flatulence, and bloating.   Drink enough water and fluids  to keep your urine clear or pale yellow. Water, juice, or caffeine-free drinks are recommended. Not drinking enough fluid may cause constipation.   Eat a variety of high-fiber foods rather than one type of fiber.   Try to increase your intake of fiber through using high-fiber foods rather than fiber pills or supplements that contain small amounts of fiber.   The goal is to change the types of food eaten. Do not supplement your present diet with high-fiber foods, but replace foods in your present diet.    INCLUDE A VARIETY OF FIBER SOURCES  Replace refined and processed grains with whole grains, canned fruits with fresh fruits, and incorporate other fiber sources. White rice, white breads, and most bakery goods contain little or no fiber.   Brown whole-grain rice, buckwheat oats, and many fruits and vegetables are all good sources of fiber. These include: broccoli, Brussels sprouts, cabbage, cauliflower, beets, sweet potatoes, white potatoes (skin on), carrots, tomatoes, eggplant, squash, berries, fresh fruits, and dried fruits.   Cereals appear to be the richest source of fiber. Cereal fiber is found in whole grains and bran. Bran is the fiber-rich outer coat of cereal grain, which is largely removed in refining. In whole-grain cereals, the bran remains. In breakfast cereals, the largest amount of fiber is found in those with "bran" in their names. The fiber content is sometimes indicated on the label.   You may need to include additional fruits and vegetables each day.   In baking, for 1 cup white flour, you may use the following substitutions:   1 cup whole-wheat flour minus 2 tablespoons.  1/2 cup white flour plus 1/2 cup whole-wheat flour.

## 2013-11-11 NOTE — Assessment & Plan Note (Signed)
[place],.  STOP AMITIZA USE OTC LAXATIVES FOR A BM. DRINK WATER EAT FIBER. TITRATE TO EFFECT.

## 2013-11-25 ENCOUNTER — Encounter: Payer: Medicare Other | Admitting: Gastroenterology

## 2013-11-29 DIAGNOSIS — M545 Low back pain, unspecified: Secondary | ICD-10-CM | POA: Diagnosis not present

## 2013-11-29 DIAGNOSIS — M47817 Spondylosis without myelopathy or radiculopathy, lumbosacral region: Secondary | ICD-10-CM | POA: Diagnosis not present

## 2013-11-29 DIAGNOSIS — IMO0002 Reserved for concepts with insufficient information to code with codable children: Secondary | ICD-10-CM | POA: Diagnosis not present

## 2013-12-16 ENCOUNTER — Encounter: Payer: Self-pay | Admitting: Gastroenterology

## 2013-12-16 ENCOUNTER — Ambulatory Visit (INDEPENDENT_AMBULATORY_CARE_PROVIDER_SITE_OTHER): Payer: Medicare Other | Admitting: Gastroenterology

## 2013-12-16 VITALS — BP 111/62 | HR 76 | Temp 98.4°F | Wt 146.0 lb

## 2013-12-16 DIAGNOSIS — R159 Full incontinence of feces: Secondary | ICD-10-CM

## 2013-12-16 DIAGNOSIS — K648 Other hemorrhoids: Secondary | ICD-10-CM

## 2013-12-16 NOTE — Assessment & Plan Note (Signed)
OPV IN 2 WEEKS FOR LEFT LATERAL BAND.  COMPLETE BANDING AND PROCEED WITH SOLESTA INJECTION.

## 2013-12-16 NOTE — Progress Notes (Signed)
SYMPTOMS: RECTAL BLEEDING, PRESSURE, PAIN, BURNING. SX BETTER. STILL HAVING ACCIDENTS. HAS WATERY STOOL THAT SHE CAN'T FEEL COMING OUT.  CONSTIPATION: YES DIARRHEA: YES  STRAINS WITH BMs: YES  TIME SPENT ON TOILET: 3-4 MINS TISSUE POKES OUT OF RECTUM: YES- GOES BACK BY ITSELF FIBER SUPPLEMENTS: NO  GLASSES OF WATER/DAY: 6-8: NO   ADDITIONAL QUESTIONS:  LATEX ALLERGY: NO PREGNANT: NO ERECTILE DYSFUNCTION MEDS OR NITRATES: NO ANTICOAGULATION/ANTIPLATELET MEDS: NO DIAGNOSED WITH CROHN'S DISEASE, PROCTITIS, PORTAL HTN, OR ANAL/RECTAL CA: NO TAKING IMMUNOSUPPRESSANTS/XRT: NO  PLAN: 1. [facility] BAND TODAY   PROCEDURE TECHNIQUE: BENEFITS RISK EXPLAINED TO PT. RECTAL EXAM REVEALS POOR TONE WITH VALSALVA. ONE [facility] BAND PLACED IN RIGHT ANTERIOR POSITION. POST-BANDING RECTAL EXAM REVEALED GOOD PLACEMENT. EXAM NON-TENDER.

## 2013-12-16 NOTE — Assessment & Plan Note (Addendum)
SX DUE TO OVERFLOW INCONTINENCE OR POOR SPHINCTER TONE. PT FAILED LINZESS. DOING BETTER WITH FIBER BARS.  COMPLETE BANDING AND PROCEED WITH SOLESTA INJECTION IF PT AGREEABLE. TRY ONE IMODIUM EVERY OTHER DAY TO TRY TO PREVENT LOOSE STOOLS.

## 2013-12-16 NOTE — Patient Instructions (Signed)
DRINK WATER TO KEEP YOUR URINE LIGHT YELLOW.  CONTINUE FIBER BARS.  SEE YOU IN 2 WEEKS TO PLACE THE LAST BAND.  PLEASE CONSIDER SOLESTA INJECTIONS TO HELP YOU CONTROL YOUR BOWELS.

## 2013-12-30 ENCOUNTER — Encounter: Payer: Medicare Other | Admitting: Gastroenterology

## 2014-01-04 ENCOUNTER — Ambulatory Visit (HOSPITAL_COMMUNITY): Payer: Medicare Other

## 2014-01-04 DIAGNOSIS — C50919 Malignant neoplasm of unspecified site of unspecified female breast: Secondary | ICD-10-CM | POA: Insufficient documentation

## 2014-01-05 NOTE — Progress Notes (Signed)
This encounter was created in error - please disregard.

## 2014-01-06 ENCOUNTER — Encounter (HOSPITAL_COMMUNITY): Payer: Self-pay

## 2014-01-24 DIAGNOSIS — R42 Dizziness and giddiness: Secondary | ICD-10-CM | POA: Diagnosis not present

## 2014-01-24 DIAGNOSIS — R5381 Other malaise: Secondary | ICD-10-CM | POA: Diagnosis not present

## 2014-01-24 DIAGNOSIS — R5383 Other fatigue: Secondary | ICD-10-CM | POA: Diagnosis not present

## 2014-01-24 DIAGNOSIS — M949 Disorder of cartilage, unspecified: Secondary | ICD-10-CM | POA: Diagnosis not present

## 2014-01-24 DIAGNOSIS — I10 Essential (primary) hypertension: Secondary | ICD-10-CM | POA: Diagnosis not present

## 2014-01-24 DIAGNOSIS — R7301 Impaired fasting glucose: Secondary | ICD-10-CM | POA: Diagnosis not present

## 2014-01-24 DIAGNOSIS — M899 Disorder of bone, unspecified: Secondary | ICD-10-CM | POA: Diagnosis not present

## 2014-01-24 DIAGNOSIS — Z79899 Other long term (current) drug therapy: Secondary | ICD-10-CM | POA: Diagnosis not present

## 2014-01-27 ENCOUNTER — Ambulatory Visit (INDEPENDENT_AMBULATORY_CARE_PROVIDER_SITE_OTHER): Payer: Medicare Other | Admitting: Gastroenterology

## 2014-01-27 ENCOUNTER — Encounter: Payer: Self-pay | Admitting: Gastroenterology

## 2014-01-27 VITALS — BP 100/52 | HR 65 | Temp 98.1°F | Ht 66.0 in | Wt 150.6 lb

## 2014-01-27 DIAGNOSIS — K648 Other hemorrhoids: Secondary | ICD-10-CM

## 2014-01-27 DIAGNOSIS — R159 Full incontinence of feces: Secondary | ICD-10-CM

## 2014-01-27 NOTE — Progress Notes (Signed)
SYMPTOMS: RECTAL BLEEDING, PRESSURE, PAIN, ITCHING, BURNING, SOILING. C/O UNPREDICTABLE PASTY STOOLS. TAKING PROBIOTIC EVERY OTHER DAY. AMITIZA MADE HER SICK.   CONSTIPATION: sometimes DIARRHEA: SOMETIMES  STRAINS WITH BMs: SHE CAN'T  TIME SPENT ON TOILET: 4-5 MINS TISSUE POKES OUT OF RECTUM: NO FIBER SUPPLEMENTS: SOMETIMES GLASSES OF WATER/DAY: 6-8: NO   ADDITIONAL QUESTIONS:  LATEX ALLERGY: NO PREGNANT: NO ERECTILE DYSFUNCTION MEDS OR NITRATES: NO ANTICOAGULATION/ANTIPLATELET MEDS: NO DIAGNOSED WITH CROHN'S DISEASE, PROCTITIS, PORTAL HTN, OR ANAL/RECTAL CA: NO TAKING IMMUNOSUPPRESSANTS/XRT: NO  PLAN: 1. [facility] BANDING TODAY   PROCEDURE TECHNIQUE: BENEFITS RISK EXPLAINED TO PT. ONE [facility] BAND PLACED IN LEFT LATERAL POSITION. POST-BANDING RECTAL EXAM REVEALED GOOD PLACEMENT. EXAM NON-TENDE

## 2014-01-27 NOTE — Progress Notes (Signed)
E30 VISIT SEP 2015 FECAL INCONTINENCE

## 2014-01-27 NOTE — Progress Notes (Signed)
Reminder in EPIC 

## 2014-01-27 NOTE — Assessment & Plan Note (Signed)
SX IMPROVED.  BANDING COMPLETE.

## 2014-01-27 NOTE — Patient Instructions (Signed)
ADD BENTYL 10 MG EVERY OTHER DAY AT BEDTIME. CALL WITH QUESTIONS OR CONCERNS.  CONTINUE THE PROBIOTIC EVERY OTHER DAY.  CALL ME IN 6 WEEKS TO LET ME KNOW IF THE BENTYL IS WORKING.  FOLLOW UP IN SEP 2015. WE WILL CONSIDER SOLESTA TO HELP YOU CONTROL YOUR BOWEL.

## 2014-01-27 NOTE — Assessment & Plan Note (Signed)
ASSOCIATED WITH FREQUENT SML CALIBER/PASTY STOOLS.  CONSIDER SOLESTA. WILL DISCUSS BENEFITS V. RISKS IN SEP 2015 ADD BENTYL 10 MG EVERY OTHER DAY AT BEDTIME CONTINUE PROBIOTIC OPV SEP 2015

## 2014-02-03 ENCOUNTER — Telehealth: Payer: Self-pay

## 2014-02-03 MED ORDER — DICYCLOMINE HCL 10 MG PO CAPS: ORAL_CAPSULE | ORAL | Status: DC

## 2014-02-03 NOTE — Progress Notes (Signed)
LMOM for Pam that Rx has been sent in.

## 2014-02-03 NOTE — Addendum Note (Signed)
Addended by: Danie Binder on: 02/03/2014 02:11 PM   Modules accepted: Orders

## 2014-02-03 NOTE — Telephone Encounter (Signed)
Pt needs her Rx for Bentyl sent to [facility].

## 2014-02-03 NOTE — Telephone Encounter (Signed)
REVIEWED.  

## 2014-02-03 NOTE — Progress Notes (Signed)
PLEASE CALL PT.  Rx sent.  

## 2014-02-04 DIAGNOSIS — H353190 Nonexudative age-related macular degeneration, unspecified eye, stage unspecified: Secondary | ICD-10-CM | POA: Diagnosis not present

## 2014-02-04 DIAGNOSIS — H52229 Regular astigmatism, unspecified eye: Secondary | ICD-10-CM | POA: Diagnosis not present

## 2014-02-04 DIAGNOSIS — H524 Presbyopia: Secondary | ICD-10-CM | POA: Diagnosis not present

## 2014-02-04 DIAGNOSIS — H5200 Hypermetropia, unspecified eye: Secondary | ICD-10-CM | POA: Diagnosis not present

## 2014-02-08 DIAGNOSIS — IMO0002 Reserved for concepts with insufficient information to code with codable children: Secondary | ICD-10-CM | POA: Diagnosis not present

## 2014-02-11 DIAGNOSIS — H04129 Dry eye syndrome of unspecified lacrimal gland: Secondary | ICD-10-CM | POA: Diagnosis not present

## 2014-04-12 DIAGNOSIS — H6120 Impacted cerumen, unspecified ear: Secondary | ICD-10-CM | POA: Diagnosis not present

## 2014-05-16 ENCOUNTER — Encounter: Payer: Self-pay | Admitting: Gastroenterology

## 2014-06-23 ENCOUNTER — Ambulatory Visit: Payer: Medicare Other | Admitting: Gastroenterology

## 2014-06-29 DIAGNOSIS — I10 Essential (primary) hypertension: Secondary | ICD-10-CM | POA: Diagnosis not present

## 2014-06-29 DIAGNOSIS — G589 Mononeuropathy, unspecified: Secondary | ICD-10-CM | POA: Diagnosis not present

## 2014-06-29 DIAGNOSIS — R42 Dizziness and giddiness: Secondary | ICD-10-CM | POA: Diagnosis not present

## 2014-06-29 DIAGNOSIS — E785 Hyperlipidemia, unspecified: Secondary | ICD-10-CM | POA: Diagnosis not present

## 2014-06-29 DIAGNOSIS — Z23 Encounter for immunization: Secondary | ICD-10-CM | POA: Diagnosis not present

## 2014-06-29 DIAGNOSIS — R7309 Other abnormal glucose: Secondary | ICD-10-CM | POA: Diagnosis not present

## 2014-06-29 DIAGNOSIS — R5381 Other malaise: Secondary | ICD-10-CM | POA: Diagnosis not present

## 2014-06-29 DIAGNOSIS — I69998 Other sequelae following unspecified cerebrovascular disease: Secondary | ICD-10-CM | POA: Diagnosis not present

## 2014-06-29 DIAGNOSIS — R5383 Other fatigue: Secondary | ICD-10-CM | POA: Diagnosis not present

## 2014-06-30 DIAGNOSIS — H903 Sensorineural hearing loss, bilateral: Secondary | ICD-10-CM | POA: Diagnosis not present

## 2014-07-01 ENCOUNTER — Other Ambulatory Visit: Payer: Self-pay | Admitting: Otolaryngology

## 2014-07-01 DIAGNOSIS — H903 Sensorineural hearing loss, bilateral: Secondary | ICD-10-CM

## 2014-07-05 DIAGNOSIS — H903 Sensorineural hearing loss, bilateral: Secondary | ICD-10-CM | POA: Diagnosis not present

## 2014-07-08 ENCOUNTER — Ambulatory Visit
Admission: RE | Admit: 2014-07-08 | Discharge: 2014-07-08 | Disposition: A | Discharge status: Home / Self Care | Payer: Medicare Other | Source: Ambulatory Visit | Attending: Otolaryngology | Admitting: Otolaryngology

## 2014-07-08 DIAGNOSIS — H903 Sensorineural hearing loss, bilateral: Secondary | ICD-10-CM

## 2014-07-08 DIAGNOSIS — Z01818 Encounter for other preprocedural examination: Secondary | ICD-10-CM | POA: Diagnosis not present

## 2014-07-13 DIAGNOSIS — H3530 Unspecified macular degeneration: Secondary | ICD-10-CM | POA: Diagnosis not present

## 2014-07-18 ENCOUNTER — Ambulatory Visit: Payer: Self-pay | Admitting: Otolaryngology

## 2014-08-08 ENCOUNTER — Other Ambulatory Visit (HOSPITAL_COMMUNITY): Payer: Self-pay | Admitting: Internal Medicine

## 2014-08-08 ENCOUNTER — Ambulatory Visit (HOSPITAL_COMMUNITY)
Admission: RE | Admit: 2014-08-08 | Discharge: 2014-08-08 | Disposition: A | Discharge status: Home / Self Care | Payer: Medicare Other | Source: Ambulatory Visit | Attending: Internal Medicine | Admitting: Internal Medicine

## 2014-08-08 DIAGNOSIS — Z0000 Encounter for general adult medical examination without abnormal findings: Secondary | ICD-10-CM | POA: Diagnosis not present

## 2014-08-08 DIAGNOSIS — D649 Anemia, unspecified: Secondary | ICD-10-CM | POA: Diagnosis not present

## 2014-08-08 DIAGNOSIS — I10 Essential (primary) hypertension: Secondary | ICD-10-CM | POA: Diagnosis not present

## 2014-08-08 DIAGNOSIS — H9190 Unspecified hearing loss, unspecified ear: Secondary | ICD-10-CM | POA: Insufficient documentation

## 2014-08-08 DIAGNOSIS — D638 Anemia in other chronic diseases classified elsewhere: Secondary | ICD-10-CM | POA: Diagnosis not present

## 2014-08-08 DIAGNOSIS — Z01818 Encounter for other preprocedural examination: Secondary | ICD-10-CM

## 2014-08-08 DIAGNOSIS — Z79899 Other long term (current) drug therapy: Secondary | ICD-10-CM | POA: Diagnosis not present

## 2014-08-08 DIAGNOSIS — H905 Unspecified sensorineural hearing loss: Secondary | ICD-10-CM | POA: Diagnosis not present

## 2014-08-10 ENCOUNTER — Encounter: Payer: Self-pay | Admitting: Gastroenterology

## 2014-08-10 ENCOUNTER — Ambulatory Visit (INDEPENDENT_AMBULATORY_CARE_PROVIDER_SITE_OTHER): Payer: Medicare Other | Admitting: Gastroenterology

## 2014-08-10 VITALS — BP 109/62 | HR 67 | Temp 97.0°F | Ht 68.0 in | Wt 145.0 lb

## 2014-08-10 DIAGNOSIS — K648 Other hemorrhoids: Secondary | ICD-10-CM

## 2014-08-10 DIAGNOSIS — K589 Irritable bowel syndrome without diarrhea: Secondary | ICD-10-CM | POA: Diagnosis not present

## 2014-08-10 DIAGNOSIS — R159 Full incontinence of feces: Secondary | ICD-10-CM

## 2014-08-10 MED ORDER — DICYCLOMINE HCL 10 MG PO CAPS: ORAL_CAPSULE | ORAL | Status: DC

## 2014-08-10 MED ORDER — LUBIPROSTONE 24 MCG PO CAPS: ORAL_CAPSULE | ORAL | Status: DC

## 2014-08-10 NOTE — Progress Notes (Signed)
ON RECALL LIST  °

## 2014-08-10 NOTE — Assessment & Plan Note (Signed)
Sx fairly well controlled.  USE BENTYL EVERY OTHER NIGHT FOLLOWED BY BENTYL THE NEXT MORNING.  CONTINUE AMITIZA EVERY OTHER NIGHT.  PLEASE CALL WITH QUESTIONS OR CONCERNS.  FOLLOW UP IN 6 MOS. MERRY CHRISTMAS AND HAPPY NEW YEAR!

## 2014-08-10 NOTE — Patient Instructions (Signed)
USE BENTYL EVERY OTHER NIGHT FOLLOWED BY BENTYL THE NEXT MORNING.  CONTINUE AMITIZA EVERY OTHER NIGHT.  PLEASE CALL WITH QUESTIONS OR CONCERNS.  FOLLOW UP IN 6 MOS. MERRY CHRISTMAS AND HAPPY NEW YEAR!

## 2014-08-10 NOTE — Assessment & Plan Note (Signed)
Rare brbpr.  CONTINUE TO MONITOR SYMPTOMS. FOLLOW UP IN 6 MOS. MERRY CHRISTMAS AND HAPPY NEW YEAR!

## 2014-08-10 NOTE — Assessment & Plan Note (Signed)
SX IMPROVED WITH BENTYL QOHS AND AMITIZA QOHS. STILL HAVING ACCIDENTS.   USE BENTYL EVERY OTHER NIGHT FOLLOWED BY BENTYL THE NEXT MORNING. CONTINUE AMITIZA EVERY OTHER NIGHT. FOLLOW UP IN 6 MOS. MERRY CHRISTMAS AND HAPPY NEW YEAR!

## 2014-08-10 NOTE — Progress Notes (Signed)
Subjective:    Patient ID: Whitney Galloway, female    DOB: [birth year], 80 y.o.   MRN: [id number]  Delphina Cahill, MD  HPI  Last seen: APR 2105: IH/FECAL INCONTINENCE.ADVISED TO  ADD BENTYL 10 MG EVERY OTHER DAY AT BEDTIME. CONTINUE PROBIOTIC DAILY.   Having a hearing transplant in [place]. SPENT SOME TIME IN [place](4 MOS). FEELS LIKE HER HEMORRHOIDS ARE COMING BACK SHE'S SEEN A LITTLE RED BLOOD. BOWEL CONTROL  A LOT BETTER. MAY HAVE AN ACCIDENT EVER ONCE IN A  WHILE. MAY OOZE A LITTLE AT A TIME. TAKING BENTYL ALTERNATING WITH AMITIZA QOHS. CAN GET STRANGLED ON LIQUIDS. PRILOSEC HELPS FOR HEARTBURN.   PT DENIES FEVER, CHILLS, nausea, vomiting, CHEST PAIN, SHORTNESS OF BREATH, abdominal pain, OR heartburn or indigestion.  Past Medical History  Diagnosis Date  . GERD (gastroesophageal reflux disease)   . HTN (hypertension)   . IBS (irritable bowel syndrome)   . Hernia of unspecified site of abdominal cavity without mention of obstruction or gangrene   . Hiatal hernia   . Diverticulosis   . Gastritis   . Splenic lesion HAMARTOMA  . Hard of hearing   . Colon polyp 1994 Malignant polyp    2005 NL BE; 2006 incomplete TCS DUE TO REDUNDNACY  . Fibromyalgia   . TB (tuberculosis) AGE 35  . Osteoporosis   . S/P endoscopy 2007, 2009    2007: single distal esophageal erosion, s/p 53 F dilation, no web/ring/stricture noted; 2009: normal, Bravo with adequate suppression on Prevacid BID  . Gastroparesis 2009    65% RETENTION AT 2 HOURS   Past Surgical History  Procedure Laterality Date  . Upper gastrointestinal endoscopy  2006  . Benign tumor removed from bilateral breast    . Colonoscopy w/ polypectomy    . Mass removed from roof of mouth    . Cataract surgery    . Lung surgery for pulmonary nodules    . Left wrist cyst removal    . X-stop implantation    . Esophagogastroduodenoscopy  08/27/2011    hiatal hernia/mild gastritis  . Savory dilation  08/27/2011    stricture in the distal  esophagus  . Esophagogastroduodenoscopy  05/25/2008     Normal esophagus without evidence of Barrett mass/  Normal stomach, duodenal bulb, and second portion of the duodenum  . Givens capsule study  05/27/2008    Adequate acid suppression with proton pump inhibitor.  . Esophagogastroduodenoscopy  08/11/2006    Single erosion at distal esophagus and a small sliding hiatal hernia/ No evidence of ring or stricture but esophagus was dilated by passing 54 [place] Maloney dilator/ Prepyloric antral erythema but no evidence of erosions or peptic ulcer disease.  . Esophagogastroduodenoscopy  09/03/2005    Small sliding hiatal hernia without evidence of ring or  stricture formation. Esophagus dilated by passing 54-[place] Maloney dilator   given history of dysphagia   No Known Allergies  Current Outpatient Prescriptions  Medication Sig Dispense Refill  . amLODipine (NORVASC) 10 MG tablet Take 10 mg by mouth daily.    . calcium carbonate (TUMS - DOSED IN MG ELEMENTAL CALCIUM) 500 MG chewable tablet Chew 1 tablet by mouth as needed. gas    . dicyclomine (BENTYL) 10 MG capsule 1 po one or two times a day to manage loose stools or abdominal pain    . fish oil-omega-3 fatty acids 1000 MG capsule Take 1 g by mouth daily.      [name] LORazepam (ATIVAN) 1 MG  tablet Take 1 mg by mouth every 8 (eight) hours.    [name] lubiprostone (AMITIZA) 24 MCG capsule 1 PO BID. TAKE WITH FOOD.    [name] metoprolol tartrate (LOPRESSOR) 25 MG tablet Take 25 mg by mouth 2 (two) times daily.    . multivitamin-lutein (OCUVITE-LUTEIN) CAPS Take 1 capsule by mouth 2 (two) times daily.    [name] omeprazole (PRILOSEC) 20 MG capsule Take 20 mg by mouth 2 (two) times daily. 1 po bid 30 MINUTES PRIOR TO YOUR MEALS    . UNABLE TO FIND Place 1 drop into both eyes 2 (two) times daily. Refresh eye drops    . venlafaxine XR (EFFEXOR-XR) 150 MG 24 hr capsule Take 150 mg by mouth daily.    [name] albuterol (PROVENTIL HFA;VENTOLIN HFA) 108 (90 BASE) MCG/ACT inhaler  Inhale 2 puffs into the lungs every 6 (six) hours as needed. For shortness of breath    . atorvastatin (LIPITOR) 20 MG tablet Take 20 mg by mouth daily.    . ferrous sulfate 325 (65 FE) MG tablet Take 325 mg by mouth daily with breakfast.    . fexofenadine (ALLEGRA) 180 MG tablet Take 180 mg by mouth daily.    . NON FORMULARY [place]  20 mg   One daily    . oxyCODONE-acetaminophen (PERCOCET/ROXICET) 5-325 MG per tablet Take 1-2 tablets by mouth every 4 (four) hours as needed.    . pseudoephedrine-guaifenesin (MUCINEX D) 60-600 MG per tablet Take 1 tablet by mouth every 12 (twelve) hours.     Review of Systems     Objective:   Physical Exam  Constitutional: She is oriented to person, place, and time. She appears well-developed and well-nourished. No distress.  HENT:  Head: Normocephalic and atraumatic.  Mouth/Throat: Oropharynx is clear and moist. No oropharyngeal exudate.  Eyes: Pupils are equal, round, and reactive to light. No scleral icterus.  Neck: Normal range of motion. Neck supple.  Cardiovascular: Normal rate, regular rhythm and normal heart sounds.   Pulmonary/Chest: Effort normal and breath sounds normal. No respiratory distress.  Abdominal: Soft. Bowel sounds are normal. She exhibits no distension. There is no tenderness.  Musculoskeletal: She exhibits no edema.  Lymphadenopathy:    She has no cervical adenopathy.  Neurological: She is alert and oriented to person, place, and time.  HARD OF HEARING, NO  NEW FOCAL DEFICITS   Psychiatric: She has a normal mood and affect.  Vitals reviewed.         Assessment & Plan:

## 2014-08-17 NOTE — Progress Notes (Signed)
cc'ed to pcp °

## 2014-09-08 DIAGNOSIS — M47817 Spondylosis without myelopathy or radiculopathy, lumbosacral region: Secondary | ICD-10-CM | POA: Diagnosis not present

## 2014-09-08 DIAGNOSIS — M5417 Radiculopathy, lumbosacral region: Secondary | ICD-10-CM | POA: Diagnosis not present

## 2014-09-16 ENCOUNTER — Encounter (HOSPITAL_BASED_OUTPATIENT_CLINIC_OR_DEPARTMENT_OTHER): Payer: Self-pay | Admitting: *Deleted

## 2014-09-16 NOTE — Progress Notes (Signed)
Pt cannot talk on phone-hoh Daughter will be with her-to bring all meds and overnight bag-she cares for self-drives,no cardiac or resp problems

## 2014-09-18 NOTE — H&P (Signed)
Whitney Galloway is an 80 y.o. female.   Chief Complaint: Profound bilateral sensorineural hearing loss beyond hearing aids HPI: See H&P below  History & Physical Examination   Patient: Whitney Galloway  WNIOEVO  Provider: Vicie Mutters, MD, MS, FACS  Date of Service:  Sep 14, 2014  Location: [facility], [address], [place]                  [place], [place]   [id number]                                Ph: [phone], Fax: [phone]                  [email or web address]     Provider: Vicie Mutters, MD, MS, FACS Encounter Date: Sep 14, 2014  Patient: Whitney, Galloway   ([id number]) Sex: Female       DOB: 1929/05/02      Age: 80 year 6 month       Race: White Address: [address],  [place]  [place]  [address] Insurance: MEDICARE  Referred By:  Vicie Mutters   Visit Type: [name], a 89 year 70 month White female is here today for a pre-operative visit.  Complaint/HPI: The patient was here today with her daughter for a preoperative evaluation prior to undergoing an ABC cochlear implant AS. Patient's CI workup has been completed. She denies any recent upper respiratory tract infection, cough, or fever. I re-reviewed cochlear implants with the daughter and the patient. She was once again shown an ABC cochlear implant and sound processor.  Previous history: The patient was here today with her daughter for evaluation of progressive hearing loss. Patient is wearing binaural digital BTE hearing aids with closed molds. She is barely able to communicate. She is feeling more and more isolated. She is using an electronic phone with a readout. She is unable to hear normal conversations which is causing her great frustration. Her father had hearing loss and many of her siblings had hearing loss. She denies any previous ear operations, noise exposure, childhood ear infections, tinnitus, otorrhea, vertigo, or otalgia. Her daughter was interested  in exploring a cochlear implant. Patient still lives by herself and does not have any major cognitive impairment.   Current Medication: 1. Amlodipine Besylate 10 Mg Tab (Other MD)  2. Atorvastatin 20 Mg Tablet (Other MD)  3. Effexor Xr 75 Mg Capsule (Other MD)  4. Lorazepam 1 Mg Tablet (Other MD)  5. Metoprolol Succ Er 25 Mg Tab (Other MD)  6. Omeprazole Dr 20 Mg Capsule (Other MD)   Medical History: Vaccinations: Flu vaccinations: Yes, had a flu shot since July 07, 2013 - code [phone].  Pneumonia vaccination: Yes - code [id number].  Surgical History: Prior surgeries include spinal surgery and lung surgery.  Family History: The patient has a family history of Hearing loss.  Social History: Smoking: Her current smoking status is never smoker/non-smoker. Marital Status: Patient is widowed. Recreational Drug Use: She denies recreational drug use.  Allergy: Patient allergies have not been reviewed.  ROS: General: (-) fever, (-) chills, (-) night sweats, (-) fatigue, (-) weakness, (-) changes in appetite or weight. (+) hayfever. Head: (-) headaches, (-) head injury or deformity. Eyes: (-) visual changes, (-) eye pain, (-) eye  discharges, (-) redness, (-) itching, (-) excessive tearing, (-) double or blurred vision, (-) glaucoma, (-) cataracts. Ears: (+) earache, (+) hearing loss. Nose and Sinuses: (-) frequent colds, (-) nasal stuffiness or itchiness, (-) postnasal drip, (-) hay fever, (-) nosebleeds, (-) sinus trouble. Mouth and Throat: (-) bleeding gums, (-) toothache, (-) odd taste sensations, (-) sores on tongue, (-) frequent sore throat, (-) hoarseness. Neck: (-) swollen glands, (-) enlarged thyroid, (-) neck pain. Cardiac: (-) chest pain, (-) edema, (-) high blood pressure, (-) irregular heartbeat, (-) orthopnea, (-) palpitations, (-) paroxysmal nocturnal dyspnea, (-) shortness of breath. Respiratory: (-) cough, (-) hemoptysis, (-) shortness of breath, (-) cyanosis, (-) wheezing,  (-) nocturnal choking or gasping, (-) TB exposure. Breasts: (-) nipple discharge, (-) breast lumps, (-) breast pain. Gastrointestinal: (+) reflux. Urinary: (-) dysuria, (-) frequency, (-) urgency, (-) hesitancy, (-) polyuria, (-) nocturia, (-) hematuria, (-) urinary incontinence, (-) flank pain, (-) change in urinary habits. Gynecologic/Urologic: (-) genital sores or lesions, (-) history of STD, (-) sexual difficulties. Musculoskeletal: (-) muscle pain, (-) joint pain, (-) bone pain. Peripheral Vascular: (-) intermittent claudication, (-) cramps, (-) varicose veins, (-) thrombophlebitis. Neurological: (-) numbness, (-) tingling, (-) tremors, (-) seizures, (-) vertigo, (-) dizziness, (-) memory loss, (-) any focal or diffuse neurological deficits. Psychiatric: (-) anxiety, (-) depression, (-) sleep disturbance, (-) irritability, (-) mood swings, (-) suicidal thoughts or ideations. Endocrine: (-) heat or cold intolerance, (-) excessive sweating, (-) diabetes, (-) excessive thirst, (-) excessive hunger, (-) excessive urination, (-) hirsutism, (-) change in ring or shoe size. Hematologic/Lymphatic: (-) anemia, (-) easy bruising, (-) excessive bleeding, (-) history of blood transfusions. Skin: itchy skin.  Vital Signs: Weight:   65.771 kgs Height:   5\' 8"  BMI:    22.04 BSA:    1.77 BP:    122/68  Examination: General Appearance - Adult: The patient is a well-developed, well-nourished, female, has no recognizable syndromes or patterns of malformation, and is in no acute distress. She is awake, alert, coherent, spontaneous, and logical. She is oriented to time, place, and person and communicates without difficulty.  Head: The patient's head was normocephalic and without any evidence of trauma or lesions.  Face: Her facial motion was intact and symmetric bilaterally with normal resting facial tone and voluntary facial power.  Skin: Gross inspection of her facial skin demonstrated no evidence of  abnormality.  Eyes: Her pupils are equal, regular, reactive to light and accommodate (PERRLA). Extraocular movements were intact (EOMI). Conjunctivae were normal. There was no sclera icterus. There was no nystagmus. Eyelids appeared normal. There was no ptosis, lid lag, lid edema, or lagophthalmos.  External ears: Both of her external ears were normal in size, shape, angulation, and location.  External auditory canals: Her external auditory canal was normal in diameter and had intact, healthy skin. There were no signs of infection, exposed bone, or canal cholesteatoma. Minimal cerumen was removed to facilitate examination.  Right Tympanic Membrane: The right tympanic membrane was clear and mobile. There was an air containing right middle ear space.  Left Tympanic Membrane: The left tympanic membrane was clear and mobile. There was an air containing left middle ear space.  Nose - external exam: External examination of the nose revealed a stable nasal dorsum with normal support, normal skin, and patent nares. There were no deformities. Nose - internal exam: Anterior rhinoscopy revealed healthy, pink nasal septal and inferior/middle turbinate mucosa. The nasal septum was midline and without lesions or perforations. There was no bleeding  noted. There were no polyps, lesions, masses or foreign bodies. Her airway was patent bilaterally.  Oral Cavity: Examination of the oral cavity revealed healthy moist mucosa, no evidence of lesions, ulcerations, erythema, edema, or leukoplakia. Gingiva and teeth were unremarkable. Her lips, tongue and palates were normal. There were no lingual fasciculations. The oropharynx was symmetric and without lesions. The gag reflex was intact and symmetric.  Neck: Examination of her neck revealed full range of motion without pain. There were no significant palpable masses or cervical lymphadenopathy. There was normal laryngeal crepitus. The trachea was midline. Her thyroid gland  was not enlarged and did not have any palpable masses. There was no evidence of jugular venous distention. There were no audible carotid bruits.  Impression: Other:  1. Severe to profound bilateral sensorineural hearing losses with very poor discrimination ability. She hears better with her right aid than her left aid. 2. Proceed with a left ABC Cochlear implant, four hours, [facility], general endotracheal anesthesia with a 23 hour recovery care stay. Risks, complications, and alternatives were explained to the patient. Patient read our CI informed consent form. Questions were invited and answered. Informed consent was signed and witnessed. Preop teaching and counseling were provided. 4. Continue to wear her hearing aids until the procedure. 5. The daughter and the patient understand that one third of patients are low performers, one third of patients are middle performers, and one third of patients are high performers. The understand that there is no way to differentiate among the patient's and how well they will do with a cochlear implant. I told her daughter that I could not give any guarantees that the patient could achieve any hearing with a cochlear implant. There were also counseled that it takes at least one to two years for patient to learn how to use a cochlear implant. Absolutely no guarantees were made.  Plan: Clinical summary letter made available to patient today. This letter may not be complete at time of service. Please contact our office within 3 days for a completed summary of today's visit.  Status: stable. Medications: None required. Diet: no restriction. Procedure: Cochlear implant: Left ear. Duration:  4 hours. Surgeon: Fannie Knee MD Office Phone: [phone] Office Fax: [phone] Cell Phone: [phone]. Anesthesia Required: General. Equipment:  Mastoid instruments NIM monitor Stryker drill. Recovery Care Center: yes. Latex Allergy:  .  Informed consent: Informed consent for a cochlear implant was provided. The consent was provided in an office examination room. The patient daughter was given our written Cochlear Implant Informed Consent to read because they could not hear. The recommended procedure(s) was/were explained to the patient daughter. Advantages, disadvantages, and options were reviewed, including doing nothing. Questions were invited and answered. Preoperative teaching and counseling were provided. The time and date of the procedure were reviewed. No guarantees were implied or made that cochlear implantation would be able to provide any hearing or useful hearing. Informed consent - status: Informed consent was provided and was signed and witnessed.  Recommendations: Follow-Up: Postoperative visit as scheduled.  Diagnosis: H90.3  Sensorineural hearing loss, bilateral   Careplan: (1) Hearing Loss (2) Postop Tympanomastoid/cochlear implant  Followup: Postop visit         Past Medical History  Diagnosis Date  . GERD (gastroesophageal reflux disease)   . HTN (hypertension)   . IBS (irritable bowel syndrome)   . Hernia of unspecified site of abdominal cavity without mention of obstruction or gangrene   . Hiatal  hernia   . Diverticulosis   . Gastritis   . Splenic lesion HAMARTOMA  . Hard of hearing   . Colon polyp 1994 Malignant polyp    2005 NL BE; 2006 incomplete TCS DUE TO REDUNDNACY  . Fibromyalgia   . TB (tuberculosis) AGE 72  . Osteoporosis   . S/P endoscopy 2007, 2009    2007: single distal esophageal erosion, s/p 20 F dilation, no web/ring/stricture noted; 2009: normal, Bravo with adequate suppression on Prevacid BID  . Gastroparesis 2009    65% RETENTION AT 2 HOURS  . HIATAL HERNIA 06/23/2008    Qualifier: Diagnosis of  By: Craige Cotta    . Bowel incontinence   . Wears dentures     top  . Wears hearing aid   . Wears glasses     Past Surgical History  Procedure Laterality Date   . Upper gastrointestinal endoscopy  2006  . Benign tumor removed from bilateral breast    . Colonoscopy w/ polypectomy    . Mass removed from roof of mouth    . Cataract surgery    . Lung surgery for pulmonary nodules    . Left wrist cyst removal    . X-stop implantation    . Esophagogastroduodenoscopy  08/27/2011    hiatal hernia/mild gastritis  . Savory dilation  08/27/2011    stricture in the distal esophagus  . Esophagogastroduodenoscopy  05/25/2008     Normal esophagus without evidence of Barrett mass/  Normal stomach, duodenal bulb, and second portion of the duodenum  . Givens capsule study  05/27/2008    Adequate acid suppression with proton pump inhibitor.  . Esophagogastroduodenoscopy  08/11/2006    Single erosion at distal esophagus and a small sliding hiatal hernia/ No evidence of ring or stricture but esophagus was dilated by passing 50 [place] Maloney dilator/ Prepyloric antral erythema but no evidence of erosions or peptic ulcer disease.  . Esophagogastroduodenoscopy  09/03/2005    Small sliding hiatal hernia without evidence of ring or  stricture formation. Esophagus dilated by passing 54-French Maloney dilator   given history of dysphagia  . Back surgery  2014    lumbar lam    History reviewed. No pertinent family history. Social History:  reports that she quit smoking about 10 years ago. She has never used smokeless tobacco. She reports that she does not drink alcohol or use illicit drugs.  Allergies: No Known Allergies  No prescriptions prior to admission    No results found for this or any previous visit (from the past 48 hour(s)). No results found.  Review of Systems  Constitutional: Negative.   HENT: Positive for hearing loss.   Eyes: Negative.   Respiratory: Negative.   Cardiovascular: Negative.   Gastrointestinal: Negative.   Genitourinary: Negative.   Musculoskeletal: Negative.   Skin: Negative.   Neurological: Negative.   Endo/Heme/Allergies:  Negative.     Height 5\' 8"  (1.727 m), weight 65.772 kg (145 lb). Physical Exam   Assessment/Plan 1. Profound bilateral sensorineural hearing loss beyond hearing aids. 2. Recommend proceeding with a Left Advanced Bionics Advantage Hi-Res 90K Cochlear Implant with a HiFocus Mid-scalar Electrode, 4 hours, gen ET anesthesia, 23 hour overnight observation in the [place]. Risks, complications, and alternatives have been explained to the patient and her daughter. Questions have been invited and answered. Informed consent has been provided and read by both the patient and her daughter. Preoperative teaching and counseling have been provided.  3. The procedure is scheduled for 09-21-14, [place].  Perez Dirico M 09/18/2014, 1:34 PM

## 2014-09-20 ENCOUNTER — Other Ambulatory Visit: Payer: Self-pay

## 2014-09-20 ENCOUNTER — Encounter (HOSPITAL_COMMUNITY)
Admission: RE | Admit: 2014-09-20 | Discharge: 2014-09-20 | Disposition: A | Discharge status: Home / Self Care | Payer: Medicare Other | Source: Ambulatory Visit | Attending: Otolaryngology | Admitting: Otolaryngology

## 2014-09-20 DIAGNOSIS — M810 Age-related osteoporosis without current pathological fracture: Secondary | ICD-10-CM | POA: Diagnosis not present

## 2014-09-20 DIAGNOSIS — M1990 Unspecified osteoarthritis, unspecified site: Secondary | ICD-10-CM | POA: Diagnosis not present

## 2014-09-20 DIAGNOSIS — H903 Sensorineural hearing loss, bilateral: Secondary | ICD-10-CM | POA: Diagnosis not present

## 2014-09-20 DIAGNOSIS — F329 Major depressive disorder, single episode, unspecified: Secondary | ICD-10-CM | POA: Diagnosis not present

## 2014-09-20 DIAGNOSIS — I10 Essential (primary) hypertension: Secondary | ICD-10-CM | POA: Diagnosis not present

## 2014-09-20 DIAGNOSIS — Z87891 Personal history of nicotine dependence: Secondary | ICD-10-CM | POA: Diagnosis not present

## 2014-09-20 DIAGNOSIS — K219 Gastro-esophageal reflux disease without esophagitis: Secondary | ICD-10-CM | POA: Diagnosis not present

## 2014-09-20 DIAGNOSIS — K3184 Gastroparesis: Secondary | ICD-10-CM | POA: Diagnosis not present

## 2014-09-20 DIAGNOSIS — M797 Fibromyalgia: Secondary | ICD-10-CM | POA: Diagnosis not present

## 2014-09-20 LAB — BASIC METABOLIC PANEL
Anion gap: 11 (ref 5–15)
BUN: 16 mg/dL (ref 6–23)
CALCIUM: 9.4 mg/dL (ref 8.4–10.5)
CO2: 27 meq/L (ref 19–32)
Chloride: 105 mEq/L (ref 96–112)
Creatinine, Ser: 0.85 mg/dL (ref 0.50–1.10)
GFR calc Af Amer: 71 mL/min — ABNORMAL LOW (ref >90)
GFR, EST NON AFRICAN AMERICAN: 62 mL/min — AB (ref >90)
GLUCOSE: 70 mg/dL (ref 70–99)
POTASSIUM: 4.5 meq/L (ref 3.7–5.3)
SODIUM: 143 meq/L (ref 137–147)

## 2014-09-21 ENCOUNTER — Encounter (HOSPITAL_BASED_OUTPATIENT_CLINIC_OR_DEPARTMENT_OTHER)
Admission: RE | Disposition: A | Discharge status: Home / Self Care | Payer: Self-pay | Source: Ambulatory Visit | Attending: Otolaryngology

## 2014-09-21 ENCOUNTER — Ambulatory Visit (HOSPITAL_COMMUNITY): Payer: Medicare Other

## 2014-09-21 ENCOUNTER — Ambulatory Visit (HOSPITAL_BASED_OUTPATIENT_CLINIC_OR_DEPARTMENT_OTHER): Payer: Medicare Other | Admitting: Anesthesiology

## 2014-09-21 ENCOUNTER — Encounter (HOSPITAL_BASED_OUTPATIENT_CLINIC_OR_DEPARTMENT_OTHER): Payer: Self-pay | Admitting: Anesthesiology

## 2014-09-21 ENCOUNTER — Ambulatory Visit (HOSPITAL_BASED_OUTPATIENT_CLINIC_OR_DEPARTMENT_OTHER)
Admission: RE | Admit: 2014-09-21 | Discharge: 2014-09-22 | Disposition: A | Discharge status: Home / Self Care | Payer: Medicare Other | Source: Ambulatory Visit | Attending: Otolaryngology | Admitting: Otolaryngology

## 2014-09-21 DIAGNOSIS — M810 Age-related osteoporosis without current pathological fracture: Secondary | ICD-10-CM | POA: Insufficient documentation

## 2014-09-21 DIAGNOSIS — F329 Major depressive disorder, single episode, unspecified: Secondary | ICD-10-CM | POA: Insufficient documentation

## 2014-09-21 DIAGNOSIS — H903 Sensorineural hearing loss, bilateral: Secondary | ICD-10-CM | POA: Insufficient documentation

## 2014-09-21 DIAGNOSIS — Z9889 Other specified postprocedural states: Secondary | ICD-10-CM | POA: Diagnosis not present

## 2014-09-21 DIAGNOSIS — Z09 Encounter for follow-up examination after completed treatment for conditions other than malignant neoplasm: Secondary | ICD-10-CM

## 2014-09-21 DIAGNOSIS — K219 Gastro-esophageal reflux disease without esophagitis: Secondary | ICD-10-CM | POA: Diagnosis not present

## 2014-09-21 DIAGNOSIS — I10 Essential (primary) hypertension: Secondary | ICD-10-CM | POA: Insufficient documentation

## 2014-09-21 DIAGNOSIS — M797 Fibromyalgia: Secondary | ICD-10-CM | POA: Insufficient documentation

## 2014-09-21 DIAGNOSIS — K3184 Gastroparesis: Secondary | ICD-10-CM | POA: Insufficient documentation

## 2014-09-21 DIAGNOSIS — Z87891 Personal history of nicotine dependence: Secondary | ICD-10-CM | POA: Insufficient documentation

## 2014-09-21 DIAGNOSIS — M1990 Unspecified osteoarthritis, unspecified site: Secondary | ICD-10-CM | POA: Insufficient documentation

## 2014-09-21 HISTORY — PX: COCHLEAR IMPLANT: SHX184

## 2014-09-21 HISTORY — DX: Presence of dental prosthetic device (complete) (partial): Z97.2

## 2014-09-21 HISTORY — DX: Full incontinence of feces: R15.9

## 2014-09-21 HISTORY — DX: Presence of spectacles and contact lenses: Z97.3

## 2014-09-21 HISTORY — DX: Presence of external hearing-aid: Z97.4

## 2014-09-21 LAB — POCT HEMOGLOBIN-HEMACUE: Hemoglobin: 13.4 g/dL (ref 12.0–15.0)

## 2014-09-21 SURGERY — INSERTION, IMPLANT, COCHLEAR
Anesthesia: General | Site: Ear | Laterality: Left

## 2014-09-21 MED ORDER — CEFAZOLIN SODIUM 1-5 GM-% IV SOLN
1.0000 g | Freq: Three times a day (TID) | INTRAVENOUS | Status: DC
  Administered 2014-09-21 – 2014-09-22 (×2): 1 g via INTRAVENOUS
  Filled 2014-09-21 (×2): qty 50

## 2014-09-21 MED ORDER — LIDOCAINE HCL (CARDIAC) 20 MG/ML IV SOLN
INTRAVENOUS | Status: DC | PRN
  Administered 2014-09-21: 40 mg via INTRAVENOUS

## 2014-09-21 MED ORDER — BSS IO SOLN
INTRAOCULAR | Status: AC
  Filled 2014-09-21: qty 15

## 2014-09-21 MED ORDER — MIDAZOLAM HCL 2 MG/2ML IJ SOLN
INTRAMUSCULAR | Status: AC
  Filled 2014-09-21: qty 2

## 2014-09-21 MED ORDER — LACTATED RINGERS IV SOLN
INTRAVENOUS | Status: DC
  Administered 2014-09-21: 09:00:00 via INTRAVENOUS

## 2014-09-21 MED ORDER — LIDOCAINE-EPINEPHRINE 1 %-1:100000 IJ SOLN
INTRAMUSCULAR | Status: AC
  Filled 2014-09-21: qty 1

## 2014-09-21 MED ORDER — ONDANSETRON HCL 4 MG PO TABS: 4.0000 mg | ORAL_TABLET | ORAL | Status: DC | PRN

## 2014-09-21 MED ORDER — METOPROLOL TARTRATE 1 MG/ML IV SOLN
INTRAVENOUS | Status: DC | PRN
  Administered 2014-09-21: 2 mg via INTRAVENOUS

## 2014-09-21 MED ORDER — BSS IO SOLN
15.0000 mL | Freq: Once | INTRAOCULAR | Status: AC
  Administered 2014-09-21: 15 mL

## 2014-09-21 MED ORDER — VENLAFAXINE HCL ER 150 MG PO CP24
150.0000 mg | ORAL_CAPSULE | Freq: Every day | ORAL | Status: DC
  Administered 2014-09-22: 150 mg via ORAL

## 2014-09-21 MED ORDER — METHYLENE BLUE 1 % INJ SOLN
INTRAMUSCULAR | Status: DC | PRN
  Administered 2014-09-21: 1 mL via SUBMUCOSAL

## 2014-09-21 MED ORDER — SODIUM CHLORIDE 0.9 % IN NEBU
INHALATION_SOLUTION | RESPIRATORY_TRACT | Status: AC
  Filled 2014-09-21: qty 3

## 2014-09-21 MED ORDER — BACITRACIN ZINC 500 UNIT/GM EX OINT
TOPICAL_OINTMENT | CUTANEOUS | Status: DC | PRN
Start: 2014-09-21 — End: 2014-09-21
  Administered 2014-09-21: 1 via TOPICAL

## 2014-09-21 MED ORDER — BACITRACIN ZINC 500 UNIT/GM EX OINT
TOPICAL_OINTMENT | CUTANEOUS | Status: AC
  Filled 2014-09-21: qty 28.35

## 2014-09-21 MED ORDER — FENTANYL CITRATE 0.05 MG/ML IJ SOLN: 50.0000 ug | INTRAMUSCULAR | Status: DC | PRN

## 2014-09-21 MED ORDER — ONDANSETRON HCL 4 MG/2ML IJ SOLN: 4.0000 mg | INTRAMUSCULAR | Status: DC | PRN

## 2014-09-21 MED ORDER — SODIUM CHLORIDE 0.9 % IR SOLN
Status: DC | PRN
  Administered 2014-09-21: 100 mL

## 2014-09-21 MED ORDER — AMLODIPINE BESYLATE 10 MG PO TABS
10.0000 mg | ORAL_TABLET | Freq: Every day | ORAL | Status: DC
  Administered 2014-09-21: 10 mg via ORAL

## 2014-09-21 MED ORDER — MORPHINE SULFATE 2 MG/ML IJ SOLN
2.0000 mg | INTRAMUSCULAR | Status: DC | PRN
  Administered 2014-09-21: 2 mg via INTRAVENOUS
  Filled 2014-09-21: qty 1

## 2014-09-21 MED ORDER — PANTOPRAZOLE SODIUM 40 MG PO TBEC
40.0000 mg | DELAYED_RELEASE_TABLET | Freq: Every day | ORAL | Status: DC
  Administered 2014-09-21 – 2014-09-22 (×2): 40 mg via ORAL

## 2014-09-21 MED ORDER — CIPROFLOXACIN-HYDROCORTISONE 0.2-1 % OT SUSP
OTIC | Status: AC
  Filled 2014-09-21: qty 10

## 2014-09-21 MED ORDER — FENTANYL CITRATE 0.05 MG/ML IJ SOLN
INTRAMUSCULAR | Status: AC
  Filled 2014-09-21: qty 2

## 2014-09-21 MED ORDER — DEXAMETHASONE SODIUM PHOSPHATE 4 MG/ML IJ SOLN
INTRAMUSCULAR | Status: DC | PRN
  Administered 2014-09-21: 10 mg via INTRAVENOUS

## 2014-09-21 MED ORDER — METOPROLOL TARTRATE 25 MG PO TABS
25.0000 mg | ORAL_TABLET | Freq: Two times a day (BID) | ORAL | Status: DC
  Administered 2014-09-22: 25 mg via ORAL

## 2014-09-21 MED ORDER — ATORVASTATIN CALCIUM 20 MG PO TABS
20.0000 mg | ORAL_TABLET | Freq: Every day | ORAL | Status: DC
  Administered 2014-09-21: 20 mg via ORAL

## 2014-09-21 MED ORDER — LUBIPROSTONE 24 MCG PO CAPS: 24.0000 ug | ORAL_CAPSULE | Freq: Every day | ORAL | Status: DC

## 2014-09-21 MED ORDER — KETOROLAC TROMETHAMINE 0.5 % OP SOLN
1.0000 [drp] | Freq: Three times a day (TID) | OPHTHALMIC | Status: DC | PRN
  Administered 2014-09-21: 1 [drp] via OPHTHALMIC
  Filled 2014-09-21: qty 3

## 2014-09-21 MED ORDER — ALBUTEROL SULFATE HFA 108 (90 BASE) MCG/ACT IN AERS: 2.0000 | INHALATION_SPRAY | Freq: Four times a day (QID) | RESPIRATORY_TRACT | Status: DC | PRN

## 2014-09-21 MED ORDER — ONDANSETRON HCL 4 MG/2ML IJ SOLN: 4.0000 mg | INTRAMUSCULAR | Status: DC

## 2014-09-21 MED ORDER — DEXAMETHASONE SOD PHOSPHATE PF 10 MG/ML IJ SOLN
INTRAMUSCULAR | Status: DC | PRN
  Administered 2014-09-21: .6 mL via SURGICAL_CAVITY

## 2014-09-21 MED ORDER — ACETAMINOPHEN 650 MG RE SUPP: 650.0000 mg | RECTAL | Status: DC | PRN

## 2014-09-21 MED ORDER — HYDROCODONE-ACETAMINOPHEN 5-325 MG PO TABS
1.0000 | ORAL_TABLET | ORAL | Status: DC | PRN
  Administered 2014-09-21 – 2014-09-22 (×4): 1 via ORAL
  Filled 2014-09-21 (×4): qty 1

## 2014-09-21 MED ORDER — CEFAZOLIN (ANCEF) 1 G IV SOLR: 2.0000 g | INTRAVENOUS | Status: DC

## 2014-09-21 MED ORDER — ONDANSETRON HCL 4 MG/2ML IJ SOLN
INTRAMUSCULAR | Status: DC | PRN
  Administered 2014-09-21: 4 mg via INTRAVENOUS

## 2014-09-21 MED ORDER — DEXTROSE IN LACTATED RINGERS 5 % IV SOLN
INTRAVENOUS | Status: DC
  Administered 2014-09-21: 15:00:00 via INTRAVENOUS

## 2014-09-21 MED ORDER — METHYLENE BLUE 1 % INJ SOLN
INTRAMUSCULAR | Status: AC
  Filled 2014-09-21: qty 10

## 2014-09-21 MED ORDER — ACETAMINOPHEN 325 MG PO TABS
ORAL_TABLET | ORAL | Status: AC
  Filled 2014-09-21: qty 2

## 2014-09-21 MED ORDER — DEXAMETHASONE SODIUM PHOSPHATE 4 MG/ML IJ SOLN: 10.0000 mg | INTRAMUSCULAR | Status: DC

## 2014-09-21 MED ORDER — SUCCINYLCHOLINE CHLORIDE 20 MG/ML IJ SOLN
INTRAMUSCULAR | Status: DC | PRN
  Administered 2014-09-21: 40 mg via INTRAVENOUS

## 2014-09-21 MED ORDER — FENTANYL CITRATE 0.05 MG/ML IJ SOLN
INTRAMUSCULAR | Status: AC
  Filled 2014-09-21: qty 6

## 2014-09-21 MED ORDER — LORAZEPAM 1 MG PO TABS
1.0000 mg | ORAL_TABLET | Freq: Three times a day (TID) | ORAL | Status: DC
  Administered 2014-09-21: 1 mg via ORAL

## 2014-09-21 MED ORDER — GLYCOPYRROLATE 0.2 MG/ML IJ SOLN
INTRAMUSCULAR | Status: DC | PRN
  Administered 2014-09-21: 0.2 mg via INTRAVENOUS

## 2014-09-21 MED ORDER — LIDOCAINE-EPINEPHRINE 1 %-1:100000 IJ SOLN
INTRAMUSCULAR | Status: DC | PRN
  Administered 2014-09-21: 7 mL via INTRADERMAL

## 2014-09-21 MED ORDER — EPINEPHRINE HCL 1 MG/ML IJ SOLN
INTRAMUSCULAR | Status: AC
  Filled 2014-09-21: qty 1

## 2014-09-21 MED ORDER — PROPOFOL 10 MG/ML IV BOLUS
INTRAVENOUS | Status: DC | PRN
  Administered 2014-09-21: 30 mg via INTRAVENOUS
  Administered 2014-09-21: 130 mg via INTRAVENOUS

## 2014-09-21 MED ORDER — FENTANYL CITRATE 0.05 MG/ML IJ SOLN
INTRAMUSCULAR | Status: DC | PRN
  Administered 2014-09-21 (×4): 25 ug via INTRAVENOUS
  Administered 2014-09-21: 50 ug via INTRAVENOUS

## 2014-09-21 MED ORDER — FENTANYL CITRATE 0.05 MG/ML IJ SOLN
25.0000 ug | INTRAMUSCULAR | Status: DC | PRN
  Administered 2014-09-21: 25 ug via INTRAVENOUS

## 2014-09-21 MED ORDER — ACETAMINOPHEN 160 MG/5ML PO SOLN
650.0000 mg | ORAL | Status: DC | PRN
  Administered 2014-09-21: 650 mg via ORAL

## 2014-09-21 MED ORDER — CEFAZOLIN SODIUM-DEXTROSE 2-3 GM-% IV SOLR
INTRAVENOUS | Status: DC | PRN
  Administered 2014-09-21: 2 g via INTRAVENOUS

## 2014-09-21 MED ORDER — MIDAZOLAM HCL 2 MG/2ML IJ SOLN: 1.0000 mg | INTRAMUSCULAR | Status: DC | PRN

## 2014-09-21 MED ORDER — CIPROFLOXACIN-HYDROCORTISONE 0.2-1 % OT SUSP
OTIC | Status: DC | PRN
  Administered 2014-09-21: 3 [drp]

## 2014-09-21 SURGICAL SUPPLY — 83 items
ATTRACTOMAT 16X20 MAGNETIC DRP (DRAPES) IMPLANT
BAG DECANTER FOR FLEXI CONT (MISCELLANEOUS) ×2 IMPLANT
BENZOIN TINCTURE PRP APPL 2/3 (GAUZE/BANDAGES/DRESSINGS) IMPLANT
BLADE CLIPPER SURG (BLADE) ×2 IMPLANT
BLADE NEEDLE 3 SS STRL (BLADE) ×2 IMPLANT
BUR DIAMOND COARSE 3.0 (BURR) ×2 IMPLANT
BUR RND 2.0 SOFT (BURR) ×2 IMPLANT
BUR RND OSTEON ELITE 6.0 (BURR) ×2 IMPLANT
BUR SABER RD CUTTING 3.0 (BURR) ×2 IMPLANT
BUR SABER TAPERED DIAMOND 1 (BURR) ×2 IMPLANT
BUR STRYKER TAPERED RND 1.5 (BURR) ×2 IMPLANT
BUR SURG RND 2.0 DIAM (BURR) ×2 IMPLANT
CANISTER SUCT 1200ML W/VALVE (MISCELLANEOUS) ×2 IMPLANT
CORDS BIPOLAR (ELECTRODE) ×2 IMPLANT
COTTONBALL LRG STERILE PKG (GAUZE/BANDAGES/DRESSINGS) ×2 IMPLANT
COVER BACK TABLE 60X90IN (DRAPES) ×2 IMPLANT
COVER SURGICAL LIGHT HANDLE (MISCELLANEOUS) ×2 IMPLANT
DECANTER SPIKE VIAL GLASS SM (MISCELLANEOUS) ×2 IMPLANT
DEPRESSOR TONGUE BLADE STERILE (MISCELLANEOUS) ×2 IMPLANT
DRAIN JACKSON RD 7FR 3/32 (WOUND CARE) IMPLANT
DRAIN PENROSE 1/2X12 LTX STRL (WOUND CARE) IMPLANT
DRAIN PENROSE 1/4X12 LTX STRL (WOUND CARE) ×2 IMPLANT
DRAPE C-ARM 42X72 X-RAY (DRAPES) ×2 IMPLANT
DRAPE INCISE IOBAN 66X45 STRL (DRAPES) ×2 IMPLANT
DRAPE MICROSCOPE WILD 40.5X102 (DRAPES) ×2 IMPLANT
DRAPE SURG 17X23 STRL (DRAPES) ×2 IMPLANT
DRSG EMULSION OIL 3X3 NADH (GAUZE/BANDAGES/DRESSINGS) ×2 IMPLANT
DRSG GLASSCOCK MASTOID ADT (GAUZE/BANDAGES/DRESSINGS) ×2 IMPLANT
DRSG GLASSCOCK MASTOID PED (GAUZE/BANDAGES/DRESSINGS) IMPLANT
ELECT COATED BLADE 2.86 ST (ELECTRODE) ×2 IMPLANT
ELECT PAIRED SUBDERMAL (MISCELLANEOUS) ×2
ELECT REM PT RETURN 9FT ADLT (ELECTROSURGICAL) ×2
ELECTRODE PAIRED SUBDERMAL (MISCELLANEOUS) ×1 IMPLANT
ELECTRODE REM PT RTRN 9FT ADLT (ELECTROSURGICAL) ×1 IMPLANT
EVACUATOR SILICONE 100CC (DRAIN) IMPLANT
GAUZE SPONGE 4X4 12PLY STRL (GAUZE/BANDAGES/DRESSINGS) ×2 IMPLANT
GAUZE SPONGE 4X4 16PLY XRAY LF (GAUZE/BANDAGES/DRESSINGS) ×2 IMPLANT
GLOVE ECLIPSE 7.5 STRL STRAW (GLOVE) ×6 IMPLANT
GLOVE SURG SS PI 7.0 STRL IVOR (GLOVE) ×4 IMPLANT
GOWN STRL REUS W/ TWL LRG LVL3 (GOWN DISPOSABLE) ×1 IMPLANT
GOWN STRL REUS W/ TWL XL LVL3 (GOWN DISPOSABLE) ×1 IMPLANT
GOWN STRL REUS W/TWL LRG LVL3 (GOWN DISPOSABLE) ×1
GOWN STRL REUS W/TWL XL LVL3 (GOWN DISPOSABLE) ×1
IMPLANT COCHLEAR (Prosthesis and Implant ENT) ×2 IMPLANT
IV NS 500ML (IV SOLUTION) ×1
IV NS 500ML BAXH (IV SOLUTION) ×1 IMPLANT
IV NS IRRIG 3000ML ARTHROMATIC (IV SOLUTION) ×2 IMPLANT
MARKER SKIN DUAL TIP RULER LAB (MISCELLANEOUS) IMPLANT
NDL SAFETY ECLIPSE 18X1.5 (NEEDLE) ×1 IMPLANT
NEEDLE HYPO 18GX1.5 SHARP (NEEDLE) ×1
NEEDLE HYPO 22GX1.5 SAFETY (NEEDLE) ×2 IMPLANT
NEEDLE HYPO 25X1 1.5 SAFETY (NEEDLE) ×2 IMPLANT
NEEDLE SPNL 25GX3.5 QUINCKE BL (NEEDLE) ×2 IMPLANT
NS IRRIG 1000ML POUR BTL (IV SOLUTION) ×2 IMPLANT
PACK BASIN DAY SURGERY FS (CUSTOM PROCEDURE TRAY) ×2 IMPLANT
PACK ENT DAY SURGERY (CUSTOM PROCEDURE TRAY) ×2 IMPLANT
PATTIES SURGICAL .25X.25 (GAUZE/BANDAGES/DRESSINGS) ×2 IMPLANT
PENCIL BUTTON HOLSTER BLD 10FT (ELECTRODE) ×2 IMPLANT
PROBE NERVBE PRASS .33 (MISCELLANEOUS) ×2 IMPLANT
SET EXT MALE ROTATING LL 32IN (MISCELLANEOUS) ×2 IMPLANT
SET IRRIG Y TYPE TUR BLADDER L (SET/KITS/TRAYS/PACK) ×2 IMPLANT
SLEEVE SCD COMPRESS KNEE MED (MISCELLANEOUS) ×2 IMPLANT
SLEEVE SURGEON STRL (DRAPES) ×2 IMPLANT
SPONGE GAUZE 4X4 12PLY STER LF (GAUZE/BANDAGES/DRESSINGS) IMPLANT
SPONGE NEURO XRAY DETECT 1X3 (DISPOSABLE) ×2 IMPLANT
SPONGE SURGIFOAM ABS GEL 100 (HEMOSTASIS) ×2 IMPLANT
SPONGE SURGIFOAM ABS GEL 12-7 (HEMOSTASIS) ×2 IMPLANT
STOCKINETTE 4X48 STRL (DRAPES) ×2 IMPLANT
STRIP CLOSURE SKIN 1/2X4 (GAUZE/BANDAGES/DRESSINGS) ×2 IMPLANT
SUT BONE WAX W31G (SUTURE) ×2 IMPLANT
SUT CHROMIC 3 0 PS 2 (SUTURE) ×4 IMPLANT
SUT CHROMIC 3 0 SH 27 (SUTURE) ×2 IMPLANT
SUT ETHILON 5 0 PS 2 18 (SUTURE) IMPLANT
SUT NOVAFIL 5 0 BLK 18 IN P13 (SUTURE) ×2 IMPLANT
SUT SILK 3 0 SH 30 (SUTURE) ×2 IMPLANT
SYR 5ML LL (SYRINGE) IMPLANT
SYR TB 1ML LL NO SAFETY (SYRINGE) ×4 IMPLANT
TOWEL OR 17X24 6PK STRL BLUE (TOWEL DISPOSABLE) ×4 IMPLANT
TOWEL OR NON WOVEN STRL DISP B (DISPOSABLE) ×2 IMPLANT
TRAY DSU PREP LF (CUSTOM PROCEDURE TRAY) ×2 IMPLANT
TRAY FOLEY CATH 16FR SILVER (SET/KITS/TRAYS/PACK) ×2 IMPLANT
TUBING IRRIGATION (MISCELLANEOUS) ×2 IMPLANT
WATER STERILE IRR 1000ML POUR (IV SOLUTION) ×2 IMPLANT

## 2014-09-21 NOTE — Interval H&P Note (Signed)
History and Physical Interval Note:  09/21/2014 9:36 AM  Whitney Galloway  has presented today for surgery, with the diagnosis of SENSORINEURAL HEARING LOSS BILATERAL  The various methods of treatment have been discussed with the patient and family. After consideration of risks, benefits and other options for treatment, the patient has consented to  Procedure(s): COCHLEAR IMPLANT LEFT EAR (Left) (Advanced Bionics Advantage Hi-Res 90K cochlear implant with a HiFocus mid-scalar electrode as a surgical intervention .  The patient's history has been reviewed, patient examined, no change in status, stable for surgery.  I have reviewed the patient's chart and labs.  Questions were answered to the patient's and daughter's satisfaction.  The  Patient reports a chronic cough and IBS. She denied any recent upper respiratory tract infection or fever.   Whitney Galloway, Whitney Galloway

## 2014-09-21 NOTE — Anesthesia Postprocedure Evaluation (Signed)
  Anesthesia Post-op Note  Patient: Whitney Galloway [facility]  Procedure(s) Performed: Procedure(s): COCHLEAR IMPLANT LEFT EAR (Left)  Patient Location: PACU  Anesthesia Type:General  Level of Consciousness: awake and alert   Airway and Oxygen Therapy: Patient Spontanous Breathing  Post-op Pain: mild  Post-op Assessment: Post-op Vital signs reviewed, Patient's Cardiovascular Status Stable and Respiratory Function Stable  Post-op Vital Signs: Reviewed  Filed Vitals:   09/21/14 1415  BP: 158/71  Pulse: 72  Temp:   Resp: 18    Complications: No apparent anesthesia complications

## 2014-09-21 NOTE — Brief Op Note (Signed)
09/21/2014  1:31 PM  PATIENT:  Whitney Galloway  80 y.o. female  PRE-OPERATIVE DIAGNOSIS:  Severe to profound SENSORINEURAL HEARING LOSS BILATERAL beyond hearing aids POST-OPERATIVE DIAGNOSIS: Severe to profound SENSORINEURAL HEARING LOSS BILATERAL beyond hearing aids  PROCEDURE:  Procedure(s): COCHLEAR IMPLANT LEFT EAR (Left)  SURGEON:  Surgeon(s) and Role:    * Fannie Knee, MD - Primary  PHYSICIAN ASSISTANT:   ASSISTANTS: none   ANESTHESIA:   general  EBL:  Total I/O In: 1500 [I.V.:1500] Out: 700 [Urine:700]  BLOOD ADMINISTERED:none  DRAINS: Penrose drain in the postauricular site   LOCAL MEDICATIONS USED:  XYLOCAINE 7 ml   SPECIMEN:  No Specimen  DISPOSITION OF SPECIMEN:  N/A  COUNTS:  YES  TOURNIQUET:  * No tourniquets in log *  DICTATION: .Other Dictation: Dictation Number [id number]  PLAN OF CARE: Admit for overnight observation  PATIENT DISPOSITION:  PACU - hemodynamically stable.   Delay start of Pharmacological VTE agent (>24hrs) due to surgical blood loss or risk of bleeding: not applicable

## 2014-09-21 NOTE — Transfer of Care (Signed)
Immediate Anesthesia Transfer of Care Note  Patient: Whitney Galloway [facility]  Procedure(s) Performed: Procedure(s): COCHLEAR IMPLANT LEFT EAR (Left)  Patient Location: PACU  Anesthesia Type:General  Level of Consciousness: sedated and patient cooperative  Airway & Oxygen Therapy: Patient Spontanous Breathing and Patient connected to face mask oxygen  Post-op Assessment: Report given to PACU RN and Post -op Vital signs reviewed and stable  Post vital signs: Reviewed and stable  Complications: No apparent anesthesia complications

## 2014-09-21 NOTE — Progress Notes (Signed)
S: Awake, alert, ambulating, complained of R eye pain earlier (seen by Dr. Ola Spurr and drops started), voided, complains of little pain, no vertigo  O: Afebrile, VS stable, min drainage from L postauricular drain (dressing reinforced)      Facial function intact, no nystagmus  A:  1. Stable post op course. 2. Continue eye drops for possible abrasion 3. Daughter in room. 4. Continue IVF, antibiotics, steroids, analgesics  P: 1. Plan drain removal in am. 2. Plan DC in am with daughter.

## 2014-09-21 NOTE — Anesthesia Preprocedure Evaluation (Signed)
Anesthesia Evaluation  Patient identified by MRN, date of birth, ID band Patient awake    Reviewed: Allergy & Precautions, H&P , NPO status , Patient's Chart, lab work & pertinent test results, reviewed documented beta blocker date and time   Airway Mallampati: II  TM Distance: >3 FB Neck ROM: Full    Dental no notable dental hx. (+) Upper Dentures, Partial Lower, Dental Advisory Given   Pulmonary neg pulmonary ROS, former smoker,  breath sounds clear to auscultation  Pulmonary exam normal       Cardiovascular hypertension, Pt. on medications and Pt. on home beta blockers Rhythm:Regular Rate:Normal     Neuro/Psych Depression negative neurological ROS     GI/Hepatic Neg liver ROS, hiatal hernia, GERD-  Medicated and Controlled,  Endo/Other  negative endocrine ROS  Renal/GU negative Renal ROS  negative genitourinary   Musculoskeletal  (+) Arthritis -, Osteoarthritis,  Fibromyalgia -  Abdominal   Peds  Hematology negative hematology ROS (+) anemia ,   Anesthesia Other Findings   Reproductive/Obstetrics negative OB ROS                             Anesthesia Physical Anesthesia Plan  ASA: II  Anesthesia Plan: General   Post-op Pain Management:    Induction: Intravenous  Airway Management Planned: Oral ETT  Additional Equipment:   Intra-op Plan:   Post-operative Plan: Extubation in OR  Informed Consent: I have reviewed the patients History and Physical, chart, labs and discussed the procedure including the risks, benefits and alternatives for the proposed anesthesia with the patient or authorized representative who has indicated his/her understanding and acceptance.   Dental advisory given  Plan Discussed with: CRNA  Anesthesia Plan Comments:         Anesthesia Quick Evaluation

## 2014-09-21 NOTE — Anesthesia Procedure Notes (Signed)
Procedure Name: Intubation Date/Time: 09/21/2014 10:23 AM Performed by: Marrianne Mood Pre-anesthesia Checklist: Patient identified, Emergency Drugs available, Suction available, Patient being monitored and Timeout performed Patient Re-evaluated:Patient Re-evaluated prior to inductionOxygen Delivery Method: Circle System Utilized Preoxygenation: Pre-oxygenation with 100% oxygen Intubation Type: IV induction Ventilation: Mask ventilation without difficulty Laryngoscope Size: Miller and 3 Grade View: Grade II Tube type: Oral Tube size: 7.0 mm Number of attempts: 1 Airway Equipment and Method: stylet and oral airway Placement Confirmation: ETT inserted through vocal cords under direct vision,  positive ETCO2 and breath sounds checked- equal and bilateral Tube secured with: Tape Dental Injury: Teeth and Oropharynx as per pre-operative assessment

## 2014-09-22 ENCOUNTER — Encounter (HOSPITAL_BASED_OUTPATIENT_CLINIC_OR_DEPARTMENT_OTHER): Payer: Self-pay | Admitting: Otolaryngology

## 2014-09-22 DIAGNOSIS — H903 Sensorineural hearing loss, bilateral: Secondary | ICD-10-CM | POA: Diagnosis not present

## 2014-09-22 NOTE — Discharge Instructions (Signed)
1. Elevate head of bed 30 degrees for the next 5 nights. 2. Keep left postauricular incision dry for the next week. 3. Follow your regular diet at home. 4. Apply mupirocin ointment to incision line twice/day for one week. 5. Medications: Cefzil, Vicodin for pain, and Mupirocin ointment to incision twice daily for one week, Valium as needed for vertigo - all as per the prescriptions given to you 6. Quiet indoor activity for the next week. 7. Follow the instructions on the yellow instruction sheet given to you by Dr. [name] 8. Call [phone] for any questions or problems directly related to your operation. 9. Return to see Dr. [name] on 09-28-14 at 3:55 pm for suture removal and follow-up. 64. DC home today with daughter

## 2014-09-22 NOTE — Progress Notes (Signed)
S: Quiet night, ambulating without assistance, no vertigo reported, some L incisional pain reported, some drainage overnight from penrose, daughter in room, eating  O: L CI incision stable, no sign of hematoma, left Penrose drain, removed without difficulty.      No nystagmus.      Facial function intact and symmetric.  A: 1. Stable postoperative course following a left ABC CI 2. The patient does not report any vertigo. There was no nystagmus, and facial function was intact.  P: 1. Discharge him today with her daughter 2. Return to the office on September 28, 2014 at 3:55 PM 3. Regular diet 4. Continue all of her home medications 5. DC meds:      A. Cefzil 500 mg PO B ID times 10 days     B. Vicodin 5/300 1 PO Q4 hours prn pain (20)     C. Valium 5 mg PO Q8 hours prn vertigo (20     D. Mupirocin ointment apply to the cochlear implant incision b.i.d. X one week 6. Quiet indoor activity 7. She may wash her hair in two days 8. The daughter was instructed to call [phone] for any postoperative problems or questions directly related to the procedure.

## 2014-09-22 NOTE — Discharge Summary (Signed)
Physician Discharge Summary  Patient ID: JANERA PEUGH MRN: [id number] DOB/AGE: 11-Mar-1929 80 y.o.  Admit date: 09/21/2014 Discharge date: 09/22/2014  Admission Diagnoses: 1. Severe to profound bilateral sensorineural hearing loss beyond hearing aids  Discharge Diagnoses: Same Active Problems:   Postop check   Complications none  Discharged Condition: stable  Hospital Course: Uncomplicated postoperative course. The patient was awake and alert postoperatively. She had intact facial function, no vertigo and no nystagmus. Her left post auricular Penrose drain was removed on the morning of September 22, 2014. The incision was stable and without signs of hematoma. She was discharged home in stable condition with her daughter. There were no complications.  Consults: None  Significant Diagnostic Studies: none  Treatments: IV hydration, antibiotics, steroids  Discharge Exam: Blood pressure 133/71, pulse 76, temperature 97.6 F (36.4 C), temperature source Oral, resp. rate 18, height 5\' 8"  (1.727 m), weight 64.683 kg (142 lb 9.6 oz), SpO2 98 %. The left cochlear implant incision was stable and without signs of hematoma or infection. Patient did not have any nystagmus. Her left facial function was intact and symmetric.   Disposition: 01-Home or Self Care discharged home with her daughter  Diet regular  Room Locations: Preop area, OR room number five, [facility]     Activity quiet indoor activity for one week     Medication List    STOP taking these medications        UNABLE TO FIND      TAKE these medications        albuterol 108 (90 BASE) MCG/ACT inhaler  Commonly known as:  PROVENTIL HFA;VENTOLIN HFA  Inhale 2 puffs into the lungs every 6 (six) hours as needed. For shortness of breath     amLODipine 10 MG tablet  Commonly known as:  NORVASC  Take 10 mg by mouth daily.     atorvastatin 20 MG tablet  Commonly known as:  LIPITOR  Take 20 mg by mouth daily.     calcium  carbonate 500 MG chewable tablet  Commonly known as:  TUMS - dosed in mg elemental calcium  Chew 1 tablet by mouth as needed. gas     dicyclomine 10 MG capsule  Commonly known as:  BENTYL  1 po every other night followed by another dose in the morning. YOU MAY USE ONE AT 12 NOON IF NEEDED.     fexofenadine 180 MG tablet  Commonly known as:  ALLEGRA  Take 180 mg by mouth daily.     fish oil-omega-3 fatty acids 1000 MG capsule  Take 1 g by mouth daily.     lubiprostone 24 MCG capsule  Commonly known as:  AMITIZA  1 PO at bedtime. TAKE WITH FOOD. Hold for loose stools.     metoprolol tartrate 25 MG tablet  Commonly known as:  LOPRESSOR  Take 25 mg by mouth 2 (two) times daily.     multivitamin-lutein Caps capsule  Take 1 capsule by mouth 2 (two) times daily.     pseudoephedrine-guaifenesin 60-600 MG per tablet  Commonly known as:  MUCINEX D  Take 1 tablet by mouth every 12 (twelve) hours.     venlafaxine XR 150 MG 24 hr capsule  Commonly known as:  EFFEXOR-XR  Take 150 mg by mouth daily.      ASK your doctor about these medications        LORazepam 1 MG tablet  Commonly known as:  ATIVAN  Take 1 mg by mouth every 8 (eight)  hours.     omeprazole 20 MG capsule  Commonly known as:  PRILOSEC  Take 20 mg by mouth 2 (two) times daily. 1 po bid 30 MINUTES PRIOR TO YOUR MEALS           Follow-up Information    Follow up with Jemia Fata M, MD In 1 week.   Specialty:  Otolaryngology   Why:  follow-up with Dr. [name] in his office on 09-28-14 at 3:55 pm   Contact information:   [address], [place] [address] [phone]       1. Discharge him today with her daughter 2. Return to the office on September 28, 2014 at 3:55 PM 3. Regular diet 4. Continue all of her home medications 5. DC meds:      A. Cefzil 500 mg PO B ID times 10 days     B. Vicodin 5/300 1 PO Q4 hours prn pain (20)     C. Valium 5 mg PO Q8 hours prn vertigo (20     D. Mupirocin  ointment apply to the cochlear implant incision b.i.d. X one week 6. Quiet indoor activity 7. She may wash her hair in two days 8. The daughter was instructed to call [phone] for any postoperative problems or questions directly related to the procedure.  SignedThornell Mule, Ramsay Bognar M 09/22/2014, 1:29 PM

## 2014-09-23 MED FILL — Dexamethasone Sodium Phosphate Inj 10 MG/ML: INTRAMUSCULAR | Qty: 0.6 | Status: AC

## 2014-09-23 NOTE — Op Note (Signed)
NAMESAMUELLA, RASOOL NO.:  [id number]  MEDICAL RECORD NO.:  [id number]  LOCATION:                                 FACILITY:  PHYSICIAN:  Fannie Knee, M.D.    DATE OF BIRTH:  28-Apr-1929  DATE OF PROCEDURE:  09/21/2014 DATE OF DISCHARGE:  09/22/2014                              OPERATIVE REPORT   JUSTIFICATION FOR PROCEDURE:  Whitney Galloway is an 80 year old white female, who is here today for a left cochlear implant to treat severe-to- profound sensorineural hearing loss, beyond hearing aids.  The patient had been wearing binaural digital BTE hearing aids with closed molds and was barely able to communicate.  She was feeling more and more isolated and was using an electronic phone with a read out to communicate by phone.  She was unable to hear normal conversations, which was causing her great frustration.  On physical examination, she was found to have clear and mobile tympanic membranes and intact facial function.  On audiometric testing, she was found to have severe-to-profound bilateral sensorineural hearing losses with very poor discrimination ability, beyond hearing aids.  She underwent CNC and [employer] testing and failed both tests. A CT scan of her temporal bone showed patent cochlea bilaterally.  She was counseled that she could do nothing, continue where her binaural hearing aids, or proceed with a cochlear implant.  She chose to proceed with the implant and chose the left ear.  Risks, complications and alternatives of multichannel cochlear implantation were explained to the patient and to her daughter.  Questions were invited and answered, and informed consent was read by both the patient and the daughter in our office.  Preop teaching and counseling were provided.  The patient chose to have an Advanced Bionics cochlear implant implanted rather than a MED-EL cochlear implant.  JUSTIFICATION FOR OUTPATIENT SETTING:  The patient's age and need  for general endotracheal anesthesia.  JUSTIFICATION FOR OVERNIGHT STAY:  A 23 hours of observation of facial function following cochlear implantation, left ear.  PREOPERATIVE DIAGNOSIS:  Severe-to-profound bilateral sensorineural hearing loss beyond hearing aids.  POSTOPERATIVE DIAGNOSIS:  Severe-to-profound bilateral sensorineural hearing loss beyond hearing aids.  OPERATION:  Left Advanced Bionics Advantage HiRes-90K cochlear implant with a HiFocus Mid-Scala electrode.  SURGEON:  Fannie Knee, M.D.  ANESTHESIA:  Soledad Gerlach, MD.  CRNA, Burman Nieves.  COMPLICATIONS:  None.  SUMMARY OF REPORT:  After the patient was taken to the operating room #5, she was placed in the supine position.  An IV had been begun in the holding area.  A general IV induction was performed by Dr. Ola Spurr. The patient was orally intubated without difficulty by [facility].  She was properly positioned and monitored.  Elbows and ankles were padded with foam rubber, and I initiated a time-out at 10:48 a.m.  A small amount of hair was clipped in the left postauricular area.  Hair was taped, and stockinette cap was applied.  A left lazy S cochlear implant incision was then marked with the aid of an Advanced Bionics sound processor template and an Advanced Bionics ICS template.  The circulating nurse then inserted a Foley catheter without difficulty.  Silver wire  needle electrodes were then inserted into the left orbicularis oculi muscles and suprasternal notch and connected to a NIM facial nerve monitor preamplifier.  A 7 mL of 1% Xylocaine with 1:100,000 epinephrine was then infiltrated into the left temporoparietal and postauricular incision lines.  The patient's left ear, hemiface, and postauricular area were then prepped with Betadine and draped in a standard fashion for cochlear implant.  Using the OR microscope, the left ear canal was cleaned.  A 0.6 mL of Decadron 10 mg/mL was then injected  into the left middle ear using a 25- gauge spinal needle.  The left cochlear implant incision was then created with a 15-blade and carried down through skin and subcutaneous tissue.  A T-shaped incision was made in the muscular periosteal layer, and anterior and posterior subperiosteal flaps were elevated.  The mastoid cortex was then removed with a [employer] Pi drill with continuous suction irrigation and a #6 bur. The mastoid was entered and was found to be well pneumatized.  The posterior canal wall was thinned.  The sigmoid sinus was identified and followed to the digastric ridge inferiorly.  Bone in the antrum was removed and the dissection was carried through the aditus until I could see the body of the incus.  #3 and #2 cutting burs were then used to open the facial recess and just prior to opening the recess, I switched to a #2 Diamond drill bit. The facial recess was opened without difficulty.  The chorda tympani nerve was coursing directly through the recess and was sacrificed.  The facial nerve was not exposed in the facial recess.  The patient had a normal-appearing round window niche and round window membrane.  The posterior lip of the mastoid was then undercut.  Using a 1.5-mm diamond bur, a cochleostomy was started, and the cochlea was blue lined.  A bony well was then drilled in the temporoparietal scalp for the ICS. A trough was drilled between the well and the posterior superior portion of the mastoid cavity.  A subcutaneous pocket had been made for the ICS and was tested with an ICS template.  The site was then copiously irrigated with bacitracin-containing saline. The patient was found to have a very thin skull; therefore, I elected not to drill any bony holes as the skull as it was extremely thin.  The plan was to suture the ICS to the periosteum.  Once all bleeding had been controlled, gloves were changed, and the cochleostomy was then opened further with  1.5-mm diamond bur.  I could see down the basilar turn.  An Advanced Bionics HiRes-90K Advantage cochlear implant with a HiFocus Mid-Scala electrode, serial number [id number], was then opened under saline and inspected.  It was found to be intact.  The ICS was then transferred to the temporoparietal area and placed in the subcutaneous pocket.  The electronics were then placed in the bony well.  The electrode array was placed in the trough.  The sheath of the electrode array was then removed and the electrodes were oriented toward the modiolus.  Using an offset forceps and Jeweler's forceps, and a two-handed Off-Stylet technique, the electrode array was inserted into the cochlea.  The tip was inserted to the depth of the first blue line and then 1 minute and 30 seconds was used for a gentle insertion of the electrode array.  The electrode array was inserted to the second blue line at the cochleostomy. Cochleostomy was then sealed with small squares of fascia, and larger squares  of fascia and muscle were used to obliterate the facial recess.  Three rectangles of Gelfoam soaked in saline were then used to stabilize the electrode array within the mastoid cavity.  It should be noted that the ICS was sutured to the surrounding periosteum using a figure-of- eight 3-0 silk suture. Flaps were then closed over the electrode array proximally, and the T-shaped musculoperiosteal incision was then closed with interrupted 3-0 chromics.  The site was copiously irrigated with bacitracin-containing saline.  A sliver Penrose drain was placed in the bed, and the postauricular incision was then closed in two layers using interrupted inverted 3-0 chromics for the subcutaneous layer. Skin was closed with a running locking 5-0 Novafil.  Bacitracin ointment was applied.  An intraoperative C-arm x-ray was performed, and the electrode array was found to be well within the cochlea with its normal C-shaped  course.  The patient's left ear was then padded with Telfa and cotton, and a standard adult Glasscock mastoid dressing was applied loosely in the standard fashion.  The foley catheter was removed by the circulating nurse.  The patient was awakened, extubated, and transferred to her hospital bed.  She appeared to tolerate both the general endotracheal anesthesia and the procedure well and left the operating room in stable condition.  TOTAL FLUIDS:  1400 mL.  TOTAL URINE OUTPUT:  700 mL.  ESTIMATED BLOOD LOSS:  Less than 30 mL.  Sponge, needle, and cotton ball counts were correct at the termination of the procedure.  The patient received Ancef 2 g IV, Zofran 4 mg IV at the beginning and end of the procedure, and Decadron 10 mg IV.  Ms. Bowlds will be admitted to the PACU, then the 23-hour overnight recovery care stay for overnight observation.  If stable overnight, she will be discharged on September 22, 2014, with her daughter, who will be instructed to return her to my office on September 28, 2014, at 3:55 p.m.  Discharge medications will include: 1. Cefzil 500 mg p.o. b.i.d. x10 days with food. 2. Vicodin 5/300 one p.o. q.4 hours p.r.n. pain #20. 3. Valium 5 mg one p.o. t.i.d. p.r.n. vertigo. 4. Mupirocin ointment to apply to the incision b.i.d. x1 week.  She is to keep her head elevated, avoid direct pressure over the implant for the next week, and avoid water exposure.  She is to call [phone] for any postoperative problems directly related to the procedure. She and her daughter will be given both verbal and written instructions.  SUMMARY:  The patient underwent an uncomplicated left Advanced Bionics cochlear implant.  A cochleostomy was performed with full-length insertion of a HiRes-90K Advantage cochlear implant with a HiFocus Mid- Scala electrode,serial number was [id number].  There were no complications.  The facial nerve was not exposed.  Intraoperative C-arm x- ray  documented proper placement of the electrode array within the cochlea.   Fannie Knee, M.D.   EMK/MEDQ  D:  09/21/2014  T:  09/21/2014  Job:  [id number]

## 2014-10-21 DIAGNOSIS — H903 Sensorineural hearing loss, bilateral: Secondary | ICD-10-CM | POA: Diagnosis not present

## 2014-11-04 DIAGNOSIS — H903 Sensorineural hearing loss, bilateral: Secondary | ICD-10-CM | POA: Diagnosis not present

## 2014-11-08 DIAGNOSIS — H903 Sensorineural hearing loss, bilateral: Secondary | ICD-10-CM | POA: Diagnosis not present

## 2014-11-14 DIAGNOSIS — M47817 Spondylosis without myelopathy or radiculopathy, lumbosacral region: Secondary | ICD-10-CM | POA: Diagnosis not present

## 2014-11-14 DIAGNOSIS — M5417 Radiculopathy, lumbosacral region: Secondary | ICD-10-CM | POA: Diagnosis not present

## 2014-11-18 DIAGNOSIS — H903 Sensorineural hearing loss, bilateral: Secondary | ICD-10-CM | POA: Diagnosis not present

## 2014-12-16 DIAGNOSIS — H903 Sensorineural hearing loss, bilateral: Secondary | ICD-10-CM | POA: Diagnosis not present

## 2015-01-30 DIAGNOSIS — B9689 Other specified bacterial agents as the cause of diseases classified elsewhere: Secondary | ICD-10-CM | POA: Diagnosis not present

## 2015-01-30 DIAGNOSIS — L0202 Furuncle of face: Secondary | ICD-10-CM | POA: Diagnosis not present

## 2015-02-01 ENCOUNTER — Encounter: Payer: Self-pay | Admitting: Gastroenterology

## 2015-02-06 DIAGNOSIS — H903 Sensorineural hearing loss, bilateral: Secondary | ICD-10-CM | POA: Diagnosis not present

## 2015-02-16 DIAGNOSIS — M5416 Radiculopathy, lumbar region: Secondary | ICD-10-CM | POA: Diagnosis not present

## 2015-02-16 DIAGNOSIS — M47816 Spondylosis without myelopathy or radiculopathy, lumbar region: Secondary | ICD-10-CM | POA: Diagnosis not present

## 2015-02-20 DIAGNOSIS — R5383 Other fatigue: Secondary | ICD-10-CM | POA: Diagnosis not present

## 2015-02-20 DIAGNOSIS — J309 Allergic rhinitis, unspecified: Secondary | ICD-10-CM | POA: Diagnosis not present

## 2015-02-20 DIAGNOSIS — K30 Functional dyspepsia: Secondary | ICD-10-CM | POA: Diagnosis not present

## 2015-02-20 DIAGNOSIS — E782 Mixed hyperlipidemia: Secondary | ICD-10-CM | POA: Diagnosis not present

## 2015-02-24 DIAGNOSIS — R0600 Dyspnea, unspecified: Secondary | ICD-10-CM | POA: Diagnosis not present

## 2015-02-24 DIAGNOSIS — R5383 Other fatigue: Secondary | ICD-10-CM | POA: Diagnosis not present

## 2015-03-08 ENCOUNTER — Encounter: Payer: Self-pay | Admitting: Gastroenterology

## 2015-03-08 ENCOUNTER — Ambulatory Visit (INDEPENDENT_AMBULATORY_CARE_PROVIDER_SITE_OTHER): Payer: BLUE CROSS/BLUE SHIELD | Admitting: Gastroenterology

## 2015-03-08 ENCOUNTER — Encounter (INDEPENDENT_AMBULATORY_CARE_PROVIDER_SITE_OTHER): Payer: Self-pay

## 2015-03-08 VITALS — BP 109/62 | HR 67 | Temp 98.1°F | Ht 66.0 in | Wt 150.6 lb

## 2015-03-08 DIAGNOSIS — K5909 Other constipation: Secondary | ICD-10-CM | POA: Diagnosis not present

## 2015-03-08 DIAGNOSIS — K3184 Gastroparesis: Secondary | ICD-10-CM | POA: Diagnosis not present

## 2015-03-08 DIAGNOSIS — K589 Irritable bowel syndrome without diarrhea: Secondary | ICD-10-CM | POA: Diagnosis not present

## 2015-03-08 MED ORDER — OMEPRAZOLE 20 MG PO CPDR: DELAYED_RELEASE_CAPSULE | ORAL | Status: DC

## 2015-03-08 MED ORDER — DICYCLOMINE HCL 10 MG PO CAPS: ORAL_CAPSULE | ORAL | Status: DC

## 2015-03-08 NOTE — Patient Instructions (Signed)
DRINK WATER TO KEEP YOUR URINE LIGHT YELLOW.  FOLLOW A HIGH FIBER DIET.   AVOID FOOD THAT TRIGGERS REFLUX. SEE INFO BELOW.   FOLLOW UP IN 6 MOS.     Lifestyle and home remedies You may eliminate or reduce the frequency of heartburn by making the following lifestyle changes:  . Control your weight. Being overweight is a major risk factor for heartburn and GERD. Excess pounds put pressure on your abdomen, pushing up your stomach and causing acid to back up into your esophagus.   . Eat smaller meals. 4 TO 6 MEALS A DAY. This reduces pressure on the lower esophageal sphincter, helping to prevent the valve from opening and acid from washing back into your esophagus.   Dolphus Jenny your belt. Clothes that fit tightly around your waist put pressure on your abdomen and the lower esophageal sphincter.  . Eliminate heartburn triggers. Everyone has specific triggers.  .  Common triggers such as fatty or fried foods, spicy food, tomato sauce, carbonated beverages, alcohol, chocolate, mint, garlic, onion, caffeine and nicotine may make heartburn worse.   [name] Avoid stooping or bending. Tying your shoes is OK. Bending over for longer periods to weed your garden isn't, especially soon after eating.   . Don't lie down after a meal. Wait at least three to four hours after eating before going to bed, and don't lie down right after eating.   Alternative medicine . Several home remedies exist for treating GERD, but they provide only temporary relief. They include drinking baking soda (sodium bicarbonate) added to water or drinking other fluids such as baking soda mixed with cream of tartar and water. . Although these liquids create temporary relief by neutralizing, washing away or buffering acids, eventually they aggravate the situation by adding gas and fluid to your stomach, increasing pressure and causing more acid reflux. Further, adding more sodium to your diet may increase your blood pressure and add stress  to your heart, and excessive bicarbonate ingestion can alter the acid-base balance in your body. [name]

## 2015-03-08 NOTE — Assessment & Plan Note (Signed)
SYMPTOMS FAIRLY WELL CONTROLLED.  CONTINUE TO MONITOR SYMPTOMS. opv in 6 mos

## 2015-03-08 NOTE — Progress Notes (Signed)
Subjective:    Patient ID: Whitney Galloway, female    DOB: 23-Aug-1929, 81 y.o.   MRN: [id number]  Delphina Cahill, MD  HPI GOT HEARING IMPLANT AND STILL HAS A BAD TASTE IN HER MOUTH. WAS EATING A LOT OF MINTS. THIS WINTER ALSO EATING SPICY MEATS. GERD NOW BETTER. QUIT EATING MINTS AT NIGHTTIME. NAUSEA; MAY LASTS 1 HR AND EATS CRACKERS TO MAKE IT BETTER. BMs; PRETTY GOOD. OCCASIONALLY MAY MESS ON HERSELF. LAST WEEK WAS LIKE THAT FOR 3 DAYS. PLANNING TO GO BACK TO [place].   Past Medical History  Diagnosis Date  . GERD (gastroesophageal reflux disease)   . HTN (hypertension)   . IBS (irritable bowel syndrome)   . Hernia of unspecified site of abdominal cavity without mention of obstruction or gangrene   . Hiatal hernia   . Diverticulosis   . Gastritis   . Splenic lesion HAMARTOMA  . Hard of hearing   . Colon polyp 1994 Malignant polyp    2005 NL BE; 2006 incomplete TCS DUE TO REDUNDNACY  . Fibromyalgia   . TB (tuberculosis) AGE 62  . Osteoporosis   . S/P endoscopy 2007, 2009    2007: single distal esophageal erosion, s/p 37 F dilation, no web/ring/stricture noted; 2009: normal, Bravo with adequate suppression on Prevacid BID  . Gastroparesis 2009    65% RETENTION AT 2 HOURS  . HIATAL HERNIA 06/23/2008    Qualifier: Diagnosis of  By: Craige Cotta    . Bowel incontinence   . Wears dentures     top  . Wears hearing aid   . Wears glasses    Past Surgical History  Procedure Laterality Date  . Upper gastrointestinal endoscopy  2006  . Benign tumor removed from bilateral breast    . Colonoscopy w/ polypectomy    . Mass removed from roof of mouth    . Cataract surgery    . Lung surgery for pulmonary nodules    . Left wrist cyst removal    . X-stop implantation    . Esophagogastroduodenoscopy  08/27/2011    hiatal hernia/mild gastritis  . Savory dilation  08/27/2011    stricture in the distal esophagus  . Esophagogastroduodenoscopy  05/25/2008     Normal esophagus without evidence  of Barrett mass/  Normal stomach, duodenal bulb, and second portion of the duodenum  . Givens capsule study  05/27/2008    Adequate acid suppression with proton pump inhibitor.  . Esophagogastroduodenoscopy  08/11/2006    Single erosion at distal esophagus and a small sliding hiatal hernia/ No evidence of ring or stricture but esophagus was dilated by passing 35 [place] Maloney dilator/ Prepyloric antral erythema but no evidence of erosions or peptic ulcer disease.  . Esophagogastroduodenoscopy  09/03/2005    Small sliding hiatal hernia without evidence of ring or  stricture formation. Esophagus dilated by passing 54-French Maloney dilator   given history of dysphagia  . Back surgery  2014    lumbar lam  . Cochlear implant Left 09/21/2014    Procedure: COCHLEAR IMPLANT LEFT EAR;  Surgeon: Fannie Knee, MD;  Location: [facility];  Service: ENT;  Laterality: Left;   No Known Allergies  Current Outpatient Prescriptions  Medication Sig Dispense Refill  . albuterol (PROVENTIL HFA;VENTOLIN HFA) 108 (90 BASE) MCG/ACT inhaler Inhale 2 puffs into the lungs every 6 (six) hours as needed. For shortness of breath    . amLODipine (NORVASC) 10 MG tablet Take 10 mg by mouth daily.    [name]  atorvastatin (LIPITOR) 20 MG tablet Take 20 mg by mouth daily.    . calcium carbonate (TUMS - DOSED IN MG ELEMENTAL CALCIUM) 500 MG chewable tablet Chew 1 tablet by mouth as needed. gas    . dicyclomine (BENTYL) 10 MG capsule 1 po every other night followed by another dose in the morning. YOU MAY USE ONE AT 12 NOON IF NEEDED.    . fexofenadine (ALLEGRA) 180 MG tablet Take 180 mg by mouth daily.    . fish oil-omega-3 fatty acids 1000 MG capsule Take 1 g by mouth daily.      [name] LORazepam (ATIVAN) 1 MG tablet Take 1 mg by mouth every 8 (eight) hours.    .      . metoprolol tartrate (LOPRESSOR) 25 MG tablet Take 25 mg by mouth 2 (two) times daily.    . multivitamin-lutein (OCUVITE-LUTEIN) CAPS Take 1 capsule by  mouth 2 (two) times daily.    [name] omeprazole (PRILOSEC) 20 MG capsule 1 po bid 30 MINUTES PRIOR TO YOUR MEALS    . pseudoephedrine-guaifenesin (MUCINEX D) 60-600 MG per tablet Take 1 tablet by mouth every 12 (twelve) hours.    [name] venlafaxine XR (EFFEXOR-XR) 150 MG 24 hr capsule Take 150 mg by mouth daily.     No current facility-administered medications for this visit.      Review of Systems     Objective:   Physical Exam  Constitutional: She is oriented to person, place, and time. She appears well-developed and well-nourished. No distress.  HENT:  Head: Normocephalic and atraumatic.  Mouth/Throat: Oropharynx is clear and moist. No oropharyngeal exudate.  Eyes: Pupils are equal, round, and reactive to light. No scleral icterus.  Neck: Normal range of motion. Neck supple.  Cardiovascular: Normal rate, regular rhythm and normal heart sounds.   Pulmonary/Chest: Effort normal and breath sounds normal. No respiratory distress.  Abdominal: Soft. Bowel sounds are normal. She exhibits no distension. There is no tenderness.  Musculoskeletal: She exhibits no edema.  Lymphadenopathy:    She has no cervical adenopathy.  Neurological: She is alert and oriented to person, place, and time.  HARD OF HEARING   Psychiatric: She has a normal mood and affect.  Vitals reviewed.         Assessment & Plan:

## 2015-03-08 NOTE — Assessment & Plan Note (Signed)
SYMPTOMS FAIRLY WELL CONTROLLED.  Bentyl prn FOLLOW UP IN 6 MOS.

## 2015-03-08 NOTE — Assessment & Plan Note (Signed)
SYMPTOMS CONTROLLED/RESOLVED.  Drink water Eat fiber FOLLOW UP IN 6 MOS.

## 2015-03-10 DIAGNOSIS — H5203 Hypermetropia, bilateral: Secondary | ICD-10-CM | POA: Diagnosis not present

## 2015-03-10 DIAGNOSIS — H524 Presbyopia: Secondary | ICD-10-CM | POA: Diagnosis not present

## 2015-03-10 DIAGNOSIS — H52223 Regular astigmatism, bilateral: Secondary | ICD-10-CM | POA: Diagnosis not present

## 2015-03-10 DIAGNOSIS — H3530 Unspecified macular degeneration: Secondary | ICD-10-CM | POA: Diagnosis not present

## 2015-03-14 DIAGNOSIS — M5417 Radiculopathy, lumbosacral region: Secondary | ICD-10-CM | POA: Diagnosis not present

## 2015-03-14 DIAGNOSIS — M47817 Spondylosis without myelopathy or radiculopathy, lumbosacral region: Secondary | ICD-10-CM | POA: Diagnosis not present

## 2015-03-16 ENCOUNTER — Other Ambulatory Visit: Payer: Self-pay | Admitting: Neurosurgery

## 2015-03-16 DIAGNOSIS — M5417 Radiculopathy, lumbosacral region: Secondary | ICD-10-CM

## 2015-03-22 NOTE — Progress Notes (Signed)
cc'd to pcp 

## 2015-03-23 ENCOUNTER — Ambulatory Visit: Payer: Self-pay | Admitting: Internal Medicine

## 2015-03-27 ENCOUNTER — Encounter: Payer: Self-pay | Admitting: Internal Medicine

## 2015-03-27 ENCOUNTER — Other Ambulatory Visit: Payer: Self-pay

## 2015-03-27 ENCOUNTER — Ambulatory Visit (INDEPENDENT_AMBULATORY_CARE_PROVIDER_SITE_OTHER): Payer: Medicare Other | Admitting: Internal Medicine

## 2015-03-27 VITALS — BP 106/62 | HR 66 | Ht 68.0 in | Wt 148.0 lb

## 2015-03-27 DIAGNOSIS — R0789 Other chest pain: Secondary | ICD-10-CM | POA: Diagnosis not present

## 2015-03-27 DIAGNOSIS — R0600 Dyspnea, unspecified: Secondary | ICD-10-CM

## 2015-03-27 NOTE — Progress Notes (Signed)
Cardiology Office Note   Date:  03/27/2015   ID:  NEVILLE PAULS, DOB 01-Mar-1929, MRN [id number]  PCP:  Delphina Cahill, MD  Cardiologist:   Dorris Carnes, MD   No chief complaint on file.     History of Present Illness: Whitney Galloway is a 81 y.o. female with a history of SOB FOllowed by [facility]    SOB with fatigue or work  Gets a little tightness with this  Happens with activity  Happens if upset  NO PND Likes to stay busy  Housework  Spends time outside  Putting mulch in  Tired  Last summer had an easier time doing it.  Breaks out into sweat. Cold sweat at time  Pt does not want any procedures done  Not afraid of dying .     Current Outpatient Prescriptions  Medication Sig Dispense Refill  . amlodipine-atorvastatin (CADUET) 10-10 MG per tablet Take 1 tablet by mouth daily.    . calcium carbonate (TUMS - DOSED IN MG ELEMENTAL CALCIUM) 500 MG chewable tablet Chew 1 tablet by mouth as needed. gas    . dicyclomine (BENTYL) 10 MG capsule 1 po every other night followed by another dose in the morning. YOU MAY USE ONE AT 12 NOON IF NEEDED. 90 capsule 11  . fexofenadine (ALLEGRA) 180 MG tablet Take 180 mg by mouth daily.    . fish oil-omega-3 fatty acids 1000 MG capsule Take 1 g by mouth daily.      [name] LORazepam (ATIVAN) 1 MG tablet Take 1 mg by mouth every 8 (eight) hours.    . multivitamin-lutein (OCUVITE-LUTEIN) CAPS Take 1 capsule by mouth 2 (two) times daily.    [name] omeprazole (PRILOSEC) 20 MG capsule 1 po bid 30 MINUTES PRIOR TO YOUR MEALS 180 capsule 3  . venlafaxine XR (EFFEXOR-XR) 150 MG 24 hr capsule Take 150 mg by mouth daily.    [name] albuterol (PROVENTIL HFA;VENTOLIN HFA) 108 (90 BASE) MCG/ACT inhaler Inhale 2 puffs into the lungs every 6 (six) hours as needed. For shortness of breath     No current facility-administered medications for this visit.    Allergies:   Review of patient's allergies indicates no known allergies.   Past Medical History  Diagnosis Date  . GERD  (gastroesophageal reflux disease)   . HTN (hypertension)   . IBS (irritable bowel syndrome)   . Hernia of unspecified site of abdominal cavity without mention of obstruction or gangrene   . Hiatal hernia   . Diverticulosis   . Gastritis   . Splenic lesion HAMARTOMA  . Hard of hearing   . Colon polyp 1994 Malignant polyp    2005 NL BE; 2006 incomplete TCS DUE TO REDUNDNACY  . Fibromyalgia   . TB (tuberculosis) AGE 85  . Osteoporosis   . S/P endoscopy 2007, 2009    2007: single distal esophageal erosion, s/p 40 F dilation, no web/ring/stricture noted; 2009: normal, Bravo with adequate suppression on Prevacid BID  . Gastroparesis 2009    65% RETENTION AT 2 HOURS  . HIATAL HERNIA 06/23/2008    Qualifier: Diagnosis of  By: Craige Cotta    . Bowel incontinence   . Wears dentures     top  . Wears hearing aid   . Wears glasses     Past Surgical History  Procedure Laterality Date  . Upper gastrointestinal endoscopy  2006  . Benign tumor removed from bilateral breast    . Colonoscopy w/ polypectomy    .  Mass removed from roof of mouth    . Cataract surgery    . Lung surgery for pulmonary nodules    . Left wrist cyst removal    . X-stop implantation    . Esophagogastroduodenoscopy  08/27/2011    hiatal hernia/mild gastritis  . Savory dilation  08/27/2011    stricture in the distal esophagus  . Esophagogastroduodenoscopy  05/25/2008     Normal esophagus without evidence of Barrett mass/  Normal stomach, duodenal bulb, and second portion of the duodenum  . Givens capsule study  05/27/2008    Adequate acid suppression with proton pump inhibitor.  . Esophagogastroduodenoscopy  08/11/2006    Single erosion at distal esophagus and a small sliding hiatal hernia/ No evidence of ring or stricture but esophagus was dilated by passing 82 [place] Maloney dilator/ Prepyloric antral erythema but no evidence of erosions or peptic ulcer disease.  . Esophagogastroduodenoscopy  09/03/2005    Small  sliding hiatal hernia without evidence of ring or  stricture formation. Esophagus dilated by passing 54-French Maloney dilator   given history of dysphagia  . Back surgery  2014    lumbar lam  . Cochlear implant Left 09/21/2014    Procedure: COCHLEAR IMPLANT LEFT EAR;  Surgeon: Fannie Knee, MD;  Location: [facility];  Service: ENT;  Laterality: Left;     Social History:  The patient  reports that she quit smoking about 10 years ago. She has never used smokeless tobacco. She reports that she does not drink alcohol or use illicit drugs.   Family History:  The patient's family history is not on file.    ROS:  Please see the history of present illness. All other systems are reviewed and  Negative to the above problem except as noted.    PHYSICAL EXAM: VS:  BP 106/62 mmHg  Pulse 66  Ht 5\' 8"  (1.727 m)  Wt 148 lb (67.132 kg)  BMI 22.51 kg/m2  SpO2 98%  GEN: Well nourished, well developed, in no acute distress HEENT: normal Neck: no JVD, carotid bruits, or masses Cardiac: RRR; no murmurs, rubs, or gallops,no edema  Respiratory:  clear to auscultation bilaterally, normal work of breathing GI: soft, nontender, nondistended, + BS  No hepatomegaly  MS: no deformity Moving all extremities   Skin: warm and dry, no rash Neuro:  Strength and sensation are intact Psych: euthymic mood, full affect   EKG:  EKG is ordered today.  Dec 2015  SB 58 bpm     Lipid Panel No results found for: CHOL, TRIG, HDL, CHOLHDL, VLDL, LDLCALC, LDLDIRECT    Wt Readings from Last 3 Encounters:  03/27/15 148 lb (67.132 kg)  03/08/15 150 lb 9.6 oz (68.312 kg)  09/21/14 142 lb 9.6 oz (64.683 kg)      ASSESSMENT AND PLAN:  1.  Chest tightness/ sob  The pt is fairly active by history  She does have some chst tightness and occasional SOB  She is adamant that she wants nothing done  Ready to die I would not plan any testing  NO changes in medicines  Take activities as tolerated     Current  medicines are reviewed at length with the patient today.  The patient does not have concerns regarding medicines.  Signed, Dorris Carnes, MD  03/27/2015 2:16 PM    [facility] [place], [place], [place]  [address] Phone: [phone]; Fax: [phone]

## 2015-03-27 NOTE — Patient Instructions (Signed)
Your physician recommends that you schedule a follow-up appointment in: TBD  Your physician recommends that you continue on your current medications as directed. Please refer to the Current Medication list given to you today.  We will request Labs from Dr. Nevada Crane  Thank you for choosing [facility]!

## 2015-07-18 ENCOUNTER — Other Ambulatory Visit: Payer: Self-pay | Admitting: Anesthesiology

## 2015-07-18 DIAGNOSIS — M47817 Spondylosis without myelopathy or radiculopathy, lumbosacral region: Secondary | ICD-10-CM

## 2015-07-28 ENCOUNTER — Encounter: Payer: Self-pay | Admitting: Radiology

## 2015-07-28 ENCOUNTER — Ambulatory Visit
Admission: RE | Admit: 2015-07-28 | Discharge: 2015-07-28 | Disposition: A | Discharge status: Home / Self Care | Payer: Medicare Other | Source: Ambulatory Visit | Attending: Anesthesiology | Admitting: Anesthesiology

## 2015-07-28 ENCOUNTER — Other Ambulatory Visit: Payer: Self-pay | Admitting: Radiology

## 2015-07-28 VITALS — BP 142/67 | HR 61 | Resp 18

## 2015-07-28 DIAGNOSIS — Z9889 Other specified postprocedural states: Secondary | ICD-10-CM

## 2015-07-28 DIAGNOSIS — M47817 Spondylosis without myelopathy or radiculopathy, lumbosacral region: Secondary | ICD-10-CM

## 2015-07-28 DIAGNOSIS — M4806 Spinal stenosis, lumbar region: Secondary | ICD-10-CM | POA: Diagnosis not present

## 2015-07-28 DIAGNOSIS — M5126 Other intervertebral disc displacement, lumbar region: Secondary | ICD-10-CM

## 2015-07-28 MED ORDER — ONDANSETRON HCL 4 MG/2ML IJ SOLN
4.0000 mg | Freq: Once | INTRAMUSCULAR | Status: AC
  Administered 2015-07-28: 4 mg via INTRAMUSCULAR

## 2015-07-28 MED ORDER — MEPERIDINE HCL 100 MG/ML IJ SOLN
25.0000 mg | Freq: Once | INTRAMUSCULAR | Status: AC
  Administered 2015-07-28: 25 mg via INTRAMUSCULAR

## 2015-07-28 MED ORDER — IOHEXOL 180 MG/ML  SOLN
15.0000 mL | Freq: Once | INTRAMUSCULAR | Status: DC | PRN
  Administered 2015-07-28: 15 mL via INTRATHECAL

## 2015-07-28 NOTE — Discharge Instructions (Signed)
Myelogram Discharge Instructions  1. Go home and rest quietly for the next 24 hours.  It is important to lie flat for the next 24 hours.  Get up only to go to the restroom.  You may lie in the bed or on a couch on your back, your stomach, your left side or your right side.  You may have one pillow under your head.  You may have pillows between your knees while you are on your side or under your knees while you are on your back.  2. DO NOT drive today.  Recline the seat as far back as it will go, while still wearing your seat belt, on the way home.  3. You may get up to go to the bathroom as needed.  You may sit up for 10 minutes to eat.  You may resume your normal diet and medications unless otherwise indicated.  Drink lots of extra fluids today and tomorrow.  4. The incidence of headache, nausea, or vomiting is about 5% (one in 20 patients).  If you develop a headache, lie flat and drink plenty of fluids until the headache goes away.  Caffeinated beverages may be helpful.  If you develop severe nausea and vomiting or a headache that does not go away with flat bed rest, call [phone].  5. You may resume normal activities after your 24 hours of bed rest is over; however, do not exert yourself strongly or do any heavy lifting tomorrow. If when you get up you have a headache when standing, go back to bed and force fluids for another 24 hours.  6. Call your physician for a follow-up appointment.  The results of your myelogram will be sent directly to your physician by the following day.  7. If you have any questions or if complications develop after you arrive home, please call [phone].  Discharge instructions have been explained to the patient.  The patient, or the person responsible for the patient, fully understands these instructions.       May resume Effexor on Oct. 22, 2016, after 1:00 pm.

## 2015-07-28 NOTE — Progress Notes (Signed)
Pt returned from CT and is more comfortable at present.

## 2015-07-28 NOTE — Progress Notes (Signed)
Dr. Jeralyn Ruths in to speak to pt and daughter. Pt is very upset and according to her daughter it's because she is off her Effexor. Pt states for [place] to go ahead and do the test.

## 2015-07-29 ENCOUNTER — Emergency Department (HOSPITAL_COMMUNITY): Payer: Medicare Other

## 2015-07-29 ENCOUNTER — Emergency Department (HOSPITAL_COMMUNITY)
Admission: EM | Admit: 2015-07-29 | Discharge: 2015-07-29 | Disposition: A | Discharge status: Home / Self Care | Payer: Medicare Other | Attending: Emergency Medicine | Admitting: Emergency Medicine

## 2015-07-29 ENCOUNTER — Encounter (HOSPITAL_COMMUNITY): Payer: Self-pay | Admitting: Emergency Medicine

## 2015-07-29 DIAGNOSIS — Z8611 Personal history of tuberculosis: Secondary | ICD-10-CM | POA: Diagnosis not present

## 2015-07-29 DIAGNOSIS — M797 Fibromyalgia: Secondary | ICD-10-CM | POA: Insufficient documentation

## 2015-07-29 DIAGNOSIS — Z86010 Personal history of colonic polyps: Secondary | ICD-10-CM | POA: Insufficient documentation

## 2015-07-29 DIAGNOSIS — M545 Low back pain, unspecified: Secondary | ICD-10-CM

## 2015-07-29 DIAGNOSIS — K219 Gastro-esophageal reflux disease without esophagitis: Secondary | ICD-10-CM | POA: Diagnosis not present

## 2015-07-29 DIAGNOSIS — R0600 Dyspnea, unspecified: Secondary | ICD-10-CM | POA: Diagnosis not present

## 2015-07-29 DIAGNOSIS — F23 Brief psychotic disorder: Secondary | ICD-10-CM | POA: Diagnosis not present

## 2015-07-29 DIAGNOSIS — I10 Essential (primary) hypertension: Secondary | ICD-10-CM | POA: Insufficient documentation

## 2015-07-29 DIAGNOSIS — Z79899 Other long term (current) drug therapy: Secondary | ICD-10-CM | POA: Diagnosis not present

## 2015-07-29 DIAGNOSIS — N390 Urinary tract infection, site not specified: Secondary | ICD-10-CM

## 2015-07-29 DIAGNOSIS — Z87891 Personal history of nicotine dependence: Secondary | ICD-10-CM | POA: Insufficient documentation

## 2015-07-29 DIAGNOSIS — R079 Chest pain, unspecified: Secondary | ICD-10-CM | POA: Diagnosis not present

## 2015-07-29 DIAGNOSIS — F419 Anxiety disorder, unspecified: Secondary | ICD-10-CM | POA: Diagnosis not present

## 2015-07-29 DIAGNOSIS — Z862 Personal history of diseases of the blood and blood-forming organs and certain disorders involving the immune mechanism: Secondary | ICD-10-CM | POA: Insufficient documentation

## 2015-07-29 DIAGNOSIS — K589 Irritable bowel syndrome without diarrhea: Secondary | ICD-10-CM | POA: Insufficient documentation

## 2015-07-29 DIAGNOSIS — H9190 Unspecified hearing loss, unspecified ear: Secondary | ICD-10-CM | POA: Insufficient documentation

## 2015-07-29 LAB — URINALYSIS, ROUTINE W REFLEX MICROSCOPIC
Glucose, UA: NEGATIVE mg/dL
Hgb urine dipstick: NEGATIVE
KETONES UR: 15 mg/dL — AB
NITRITE: NEGATIVE
Protein, ur: NEGATIVE mg/dL
Specific Gravity, Urine: 1.015 (ref 1.005–1.030)
UROBILINOGEN UA: 0.2 mg/dL (ref 0.0–1.0)
pH: 7 (ref 5.0–8.0)

## 2015-07-29 LAB — CBC WITH DIFFERENTIAL/PLATELET
BASOS ABS: 0 10*3/uL (ref 0.0–0.1)
Basophils Relative: 0 %
EOS PCT: 1 %
Eosinophils Absolute: 0.1 10*3/uL (ref 0.0–0.7)
HCT: 41.7 % (ref 36.0–46.0)
Hemoglobin: 14.2 g/dL (ref 12.0–15.0)
LYMPHS PCT: 30 %
Lymphs Abs: 1.8 10*3/uL (ref 0.7–4.0)
MCH: 29.6 pg (ref 26.0–34.0)
MCHC: 34.1 g/dL (ref 30.0–36.0)
MCV: 86.9 fL (ref 78.0–100.0)
MONO ABS: 0.3 10*3/uL (ref 0.1–1.0)
Monocytes Relative: 5 %
Neutro Abs: 3.9 10*3/uL (ref 1.7–7.7)
Neutrophils Relative %: 64 %
PLATELETS: 197 10*3/uL (ref 150–400)
RBC: 4.8 MIL/uL (ref 3.87–5.11)
RDW: 12.7 % (ref 11.5–15.5)
WBC: 6.1 10*3/uL (ref 4.0–10.5)

## 2015-07-29 LAB — D-DIMER, QUANTITATIVE (NOT AT ARMC): D DIMER QUANT: 0.38 ug{FEU}/mL (ref 0.00–0.48)

## 2015-07-29 LAB — COMPREHENSIVE METABOLIC PANEL
ALT: 16 U/L (ref 14–54)
ANION GAP: 10 (ref 5–15)
AST: 29 U/L (ref 15–41)
Albumin: 4 g/dL (ref 3.5–5.0)
Alkaline Phosphatase: 93 U/L (ref 38–126)
BUN: 12 mg/dL (ref 6–20)
CHLORIDE: 106 mmol/L (ref 101–111)
CO2: 24 mmol/L (ref 22–32)
Calcium: 9.6 mg/dL (ref 8.9–10.3)
Creatinine, Ser: 0.71 mg/dL (ref 0.44–1.00)
Glucose, Bld: 115 mg/dL — ABNORMAL HIGH (ref 65–99)
POTASSIUM: 3.8 mmol/L (ref 3.5–5.1)
Sodium: 140 mmol/L (ref 135–145)
Total Bilirubin: 0.9 mg/dL (ref 0.3–1.2)
Total Protein: 6.9 g/dL (ref 6.5–8.1)

## 2015-07-29 LAB — URINE MICROSCOPIC-ADD ON

## 2015-07-29 LAB — LIPASE, BLOOD: LIPASE: 52 U/L — AB (ref 11–51)

## 2015-07-29 LAB — TROPONIN I

## 2015-07-29 MED ORDER — CEPHALEXIN 500 MG PO CAPS
500.0000 mg | ORAL_CAPSULE | Freq: Once | ORAL | Status: AC
  Administered 2015-07-29: 500 mg via ORAL
  Filled 2015-07-29: qty 1

## 2015-07-29 MED ORDER — CEPHALEXIN 500 MG PO CAPS: 500.0000 mg | ORAL_CAPSULE | Freq: Four times a day (QID) | ORAL | Status: DC

## 2015-07-29 MED ORDER — GI COCKTAIL ~~LOC~~
30.0000 mL | Freq: Once | ORAL | Status: AC
  Administered 2015-07-29: 30 mL via ORAL
  Filled 2015-07-29: qty 30

## 2015-07-29 NOTE — ED Notes (Signed)
Pt attempted urine sample and not able to urinate

## 2015-07-29 NOTE — Discharge Instructions (Signed)
Urinary Tract Infection Your testing is negative for heart attack or blood clot in the lung. Take the antibiotics as prescribed. Follow up with your doctor. Return to the ED if you develop new or worsening symptoms. Urinary tract infections (UTIs) can develop anywhere along your urinary tract. Your urinary tract is your body's drainage system for removing wastes and extra water. Your urinary tract includes two kidneys, two ureters, a bladder, and a urethra. Your kidneys are a pair of bean-shaped organs. Each kidney is about the size of your fist. They are located below your ribs, one on each side of your spine. CAUSES Infections are caused by microbes, which are microscopic organisms, including fungi, viruses, and bacteria. These organisms are so small that they can only be seen through a microscope. Bacteria are the microbes that most commonly cause UTIs. SYMPTOMS  Symptoms of UTIs may vary by age and gender of the patient and by the location of the infection. Symptoms in young women typically include a frequent and intense urge to urinate and a painful, burning feeling in the bladder or urethra during urination. Older women and men are more likely to be tired, shaky, and weak and have muscle aches and abdominal pain. A fever may mean the infection is in your kidneys. Other symptoms of a kidney infection include pain in your back or sides below the ribs, nausea, and vomiting. DIAGNOSIS To diagnose a UTI, your caregiver will ask you about your symptoms. Your caregiver will also ask you to provide a urine sample. The urine sample will be tested for bacteria and white blood cells. White blood cells are made by your body to help fight infection. TREATMENT  Typically, UTIs can be treated with medication. Because most UTIs are caused by a bacterial infection, they usually can be treated with the use of antibiotics. The choice of antibiotic and length of treatment depend on your symptoms and the type of bacteria  causing your infection. HOME CARE INSTRUCTIONS  If you were prescribed antibiotics, take them exactly as your caregiver instructs you. Finish the medication even if you feel better after you have only taken some of the medication.  Drink enough water and fluids to keep your urine clear or pale yellow.  Avoid caffeine, tea, and carbonated beverages. They tend to irritate your bladder.  Empty your bladder often. Avoid holding urine for long periods of time.  Empty your bladder before and after sexual intercourse.  After a bowel movement, women should cleanse from front to back. Use each tissue only once. SEEK MEDICAL CARE IF:   You have back pain.  You develop a fever.  Your symptoms do not begin to resolve within 3 days. SEEK IMMEDIATE MEDICAL CARE IF:   You have severe back pain or lower abdominal pain.  You develop chills.  You have nausea or vomiting.  You have continued burning or discomfort with urination. MAKE SURE YOU:   Understand these instructions.  Will watch your condition.  Will get help right away if you are not doing well or get worse.   This information is not intended to replace advice given to you by your health care provider. Make sure you discuss any questions you have with your health care provider.   Document Released: 07/03/2005 Document Revised: 06/14/2015 Document Reviewed: 11/01/2011 Elsevier Interactive Patient Education [employer].

## 2015-07-29 NOTE — ED Notes (Signed)
Instructed pt to take all of antibiotics as prescribed. 

## 2015-07-29 NOTE — ED Provider Notes (Signed)
CSN: [id number]     Arrival date & time 07/29/15  1636 History   First MD Initiated Contact with Patient 07/29/15 1639     Chief Complaint  Patient presents with  . Anxiety     (Consider location/radiation/quality/duration/timing/severity/associated sxs/prior Treatment) HPI Comments: Level V caveat for hearing impairment. Patient arrives by EMS from home. Patient had a myelogram of her lumbar spine yesterday for chronic back pain. She had her Effexor stopped prior to this procedure and restarted it today. Daughter reports patient has had increased back pain and was complaining of some shortness of breath and not acting like herself and yelling out at home. Patient lives alone. Denies any falls. Daughter states patient has not eaten at all today. She's not had any fever or vomiting. No chest pain. She complains of being short of breath but is in no distress. No history of asthma or COPD. They do not know the results of the myelogram. No focal weakness, numbness or tingling. Daughter reports patient has chronic bowel incontinence since her last back surgery 2 years ago because her "rectum is numb". This is unchanged today. No urinary incontinence. No focal weakness, numbness or new tingling. No fever or vomiting.  The history is provided by the patient, the EMS personnel and a relative. The history is limited by the condition of the patient and a language barrier.    Past Medical History  Diagnosis Date  . GERD (gastroesophageal reflux disease)   . HTN (hypertension)   . IBS (irritable bowel syndrome)   . Hernia of unspecified site of abdominal cavity without mention of obstruction or gangrene   . Hiatal hernia   . Diverticulosis   . Gastritis   . Splenic lesion HAMARTOMA  . Hard of hearing   . Colon polyp 1994 Malignant polyp    2005 NL BE; 2006 incomplete TCS DUE TO REDUNDNACY  . Fibromyalgia   . TB (tuberculosis) AGE 81  . Osteoporosis   . S/P endoscopy 2007, 2009    2007: single  distal esophageal erosion, s/p 62 F dilation, no web/ring/stricture noted; 2009: normal, Bravo with adequate suppression on Prevacid BID  . Gastroparesis 2009    65% RETENTION AT 2 HOURS  . HIATAL HERNIA 06/23/2008    Qualifier: Diagnosis of  By: Craige Cotta    . Bowel incontinence   . Wears dentures     top  . Wears hearing aid   . Wears glasses    Past Surgical History  Procedure Laterality Date  . Upper gastrointestinal endoscopy  2006  . Benign tumor removed from bilateral breast    . Colonoscopy w/ polypectomy    . Mass removed from roof of mouth    . Cataract surgery    . Lung surgery for pulmonary nodules    . Left wrist cyst removal    . X-stop implantation    . Esophagogastroduodenoscopy  08/27/2011    hiatal hernia/mild gastritis  . Savory dilation  08/27/2011    stricture in the distal esophagus  . Esophagogastroduodenoscopy  05/25/2008     Normal esophagus without evidence of Barrett mass/  Normal stomach, duodenal bulb, and second portion of the duodenum  . Givens capsule study  05/27/2008    Adequate acid suppression with proton pump inhibitor.  . Esophagogastroduodenoscopy  08/11/2006    Single erosion at distal esophagus and a small sliding hiatal hernia/ No evidence of ring or stricture but esophagus was dilated by passing 74 [place] Maloney dilator/ Prepyloric antral erythema  but no evidence of erosions or peptic ulcer disease.  . Esophagogastroduodenoscopy  09/03/2005    Small sliding hiatal hernia without evidence of ring or  stricture formation. Esophagus dilated by passing 54-French Maloney dilator   given history of dysphagia  . Back surgery  2014    lumbar lam  . Cochlear implant Left 09/21/2014    Procedure: COCHLEAR IMPLANT LEFT EAR;  Surgeon: Fannie Knee, MD;  Location: [facility];  Service: ENT;  Laterality: Left;   No family history on file. Social History  Substance Use Topics  . Smoking status: Former Smoker    Quit date:  09/16/2004  . Smokeless tobacco: Never Used     Comment: Quit x 8 years  . Alcohol Use: No   OB History    No data available     Review of Systems  Unable to perform ROS: Other      Allergies  Review of patient's allergies indicates no known allergies.  Home Medications   Prior to Admission medications   Medication Sig Start Date End Date Taking? Authorizing Provider  calcium carbonate (TUMS - DOSED IN MG ELEMENTAL CALCIUM) 500 MG chewable tablet Chew 1 tablet by mouth as needed. gas   Yes Historical Provider, MD  fexofenadine (ALLEGRA) 180 MG tablet Take 180 mg by mouth daily.   Yes Historical Provider, MD  LORazepam (ATIVAN) 1 MG tablet Take 1 mg by mouth at bedtime.    Yes Historical Provider, MD  Multiple Vitamin (MULTIVITAMIN WITH MINERALS) TABS tablet Take 1 tablet by mouth daily.   Yes Historical Provider, MD  multivitamin-lutein (OCUVITE-LUTEIN) CAPS Take 1 capsule by mouth daily.    Yes Historical Provider, MD  mupirocin ointment (BACTROBAN) 2 % Apply 1 application topically daily as needed. 07/12/15  Yes Historical Provider, MD  naproxen sodium (ALEVE) 220 MG tablet Take 220 mg by mouth 2 (two) times daily as needed (Pain).   Yes Historical Provider, MD  omeprazole (PRILOSEC) 20 MG capsule 1 po bid 30 MINUTES PRIOR TO YOUR MEALS Patient taking differently: Take 20 mg by mouth daily.  03/08/15  Yes Danie Binder, MD  Polyethyl Glycol-Propyl Glycol (SYSTANE OP) Place 1 drop into both eyes daily.   Yes Historical Provider, MD  TOPROL XL 25 MG 24 hr tablet Take 25 mg by mouth daily. 07/18/15  Yes Historical Provider, MD  venlafaxine XR (EFFEXOR-XR) 150 MG 24 hr capsule Take 150 mg by mouth daily.   Yes Historical Provider, MD  cephALEXin (KEFLEX) 500 MG capsule Take 1 capsule (500 mg total) by mouth 4 (four) times daily. 07/29/15   Ezequiel Essex, MD  dicyclomine (BENTYL) 10 MG capsule 1 po every other night followed by another dose in the morning. YOU MAY USE ONE AT 12 NOON  IF NEEDED. 03/08/15   Danie Binder, MD   BP 124/65 mmHg  Pulse 71  Temp(Src) 97.5 F (36.4 C)  Resp 18  SpO2 94% Physical Exam  Constitutional: She is oriented to person, place, and time. She appears well-developed and well-nourished. No distress.  Laying on L side  HENT:  Head: Normocephalic and atraumatic.  Mouth/Throat: Oropharynx is clear and moist. No oropharyngeal exudate.  Eyes: Conjunctivae are normal. Pupils are equal, round, and reactive to light.  Neck: Normal range of motion. Neck supple.  Cardiovascular: Normal rate, regular rhythm and normal heart sounds.   No murmur heard. Pulmonary/Chest: Effort normal and breath sounds normal. No respiratory distress.  Abdominal: Soft. There is no  tenderness. There is no rebound.  Musculoskeletal: Normal range of motion. She exhibits tenderness. She exhibits no edema.  Paraspinal lumbar tenderness. No midline tenderness  Neurological: She is alert and oriented to person, place, and time. No cranial nerve deficit.  Ankle flexion and extension intact bilaterally. Knee and hip flexion and extension intact bilaterally. Great toe extension intact bilaterally. Equal DP and PT pulses. +2 patellar reflexes bilaterally. Equal grip strength bilaterally    ED Course  Procedures (including critical care time) Labs Review Labs Reviewed  COMPREHENSIVE METABOLIC PANEL - Abnormal; Notable for the following:    Glucose, Bld 115 (*)    All other components within normal limits  LIPASE, BLOOD - Abnormal; Notable for the following:    Lipase 52 (*)    All other components within normal limits  URINALYSIS, ROUTINE W REFLEX MICROSCOPIC (NOT AT [facility]) - Abnormal; Notable for the following:    Bilirubin Urine SMALL (*)    Ketones, ur 15 (*)    Leukocytes, UA MODERATE (*)    All other components within normal limits  URINE MICROSCOPIC-ADD ON - Abnormal; Notable for the following:    Squamous Epithelial / LPF FEW (*)    Bacteria, UA FEW (*)    All  other components within normal limits  CBC WITH DIFFERENTIAL/PLATELET  TROPONIN I  TROPONIN I  D-DIMER, QUANTITATIVE (NOT AT [facility])    Imaging Review Dg Chest 2 View  07/29/2015  CLINICAL DATA:  81 year old female with acute pain and anxiety. EXAM: CHEST  2 VIEW COMPARISON:  08/08/2014 and prior chest radiographs FINDINGS: The cardiomediastinal silhouette is unchanged. Right lung postoperative changes and scarring again noted. There is no evidence of focal airspace disease, pulmonary edema, suspicious pulmonary nodule/mass, pleural effusion, or pneumothorax. No acute bony abnormalities are identified. IMPRESSION: No active cardiopulmonary disease. Electronically Signed   By: Margarette Canada M.D.   On: 07/29/2015 17:38   Ct Lumbar Spine W Contrast  07/28/2015  CLINICAL DATA:  Lumbosacral spondylosis without myelopathy. Chronic low back pain. EXAM: LUMBAR MYELOGRAM FLUOROSCOPY TIME:  Fluoroscopy Time (in minutes and seconds): 0 minutes 56 seconds Number of Acquired Images:  13 PROCEDURE: After thorough discussion of risks and benefits of the procedure including bleeding, infection, injury to nerves, blood vessels, adjacent structures as well as headache and CSF leak, written and oral informed consent was obtained. Consent was obtained by Dr. Logan Bores. Time out form was completed. Patient was positioned prone on the fluoroscopy table. Local anesthesia was provided with 1% lidocaine without epinephrine after prepped and draped in the usual sterile fashion. Puncture was performed at L5-S1 using a 3 1/2 inch 22-gauge spinal needle via a right paramedian approach. Using a single pass through the dura, the needle was placed within the thecal sac, with return of clear CSF. 15 mL of Omnipaque-180 was injected into the thecal sac, with normal opacification of the nerve roots and cauda equina consistent with free flow within the subarachnoid space. I personally performed the lumbar puncture and administered the  intrathecal contrast. I also personally supervised acquisition of the myelogram images. TECHNIQUE: Contiguous axial images were obtained through the Lumbar spine after the intrathecal infusion of contrast. Coronal and sagittal reconstructions were obtained of the axial image sets. COMPARISON:  Lumbar spine MRI 09/06/2013 FINDINGS: LUMBAR MYELOGRAM FINDINGS: Lumbar dextroscoliosis is again seen. There is no significant listhesis, however there is vacuum disc and a slight rocking motion with flexion at L2-3 which may reflect some abnormal motion at this level. L2-3  disc space height loss also appears more pronounced with standing. Sequelae of L3-4 PLIF are again identified. A moderate-sized ventral extradural defect is present at L2-3 without definite change between prone and upright positioning, although evaluation is partially limited by dilute contrast in this region on the standing images. There is also a moderate-sized ventral extradural defect at L4-5 which at most minimally increases with standing. There is evidence of lateral recess and AP spinal stenosis at both of these levels. The canal appears widely patent at the L3-4 postoperative level. CT LUMBAR MYELOGRAM FINDINGS: The there is mild upper lumbar dextroscoliosis. There is no significant listhesis. Vertebral body heights are preserved without evidence of compression fracture. Sequelae of L3-4 PLIF are again identified. Bilateral L3 and L4 pedicle screws are present without evidence of loosening. Both L3 screws breech the medial cortex of the pedicles and partially traverse the lateral recesses in close proximity to the L3 nerve roots. An interbody spacer is present at L3-4 with evidence of solid osseous fusion across the disc space. There is also solid posterolateral osseous fusion on the left. Advanced disc degeneration is again seen at L4-5 with vacuum disc phenomenon and extensive endplate sclerosis on the right with some associated cystic changes.  There is also advanced disc space narrowing at L5-S1 with less pronounced vacuum disc and sclerotic and cystic endplate changes. Prominent vacuum disc and left-sided degenerative endplate changes are present at L2-3. The conus medullaris terminates at L1-2. There is focal nerve root clumping and thickening involving the cauda equina at the L3-4 level. Rather diffuse atherosclerotic calcification is partially visualized involving the abdominal aorta and its major branch vessels. L1-2: Mild disc bulging and mild facet and ligamentum flavum hypertrophy without stenosis, unchanged. L2-3: Prominent circumferential disc bulging asymmetric to the left and severe facet and ligamentum flavum hypertrophy result in moderate spinal stenosis, mild right and mild-to-moderate left lateral recess stenosis, and minimal left neural foraminal narrowing, not significantly changed from the prior MRI. L3-4: Prior laminectomies and fusion. The spinal canal is widely patent. There is minimal right neural foraminal narrowing due to disc bulging into the inferior foramen, unchanged and without evidence of neural impingement. L4-5: Circumferential disc bulging and moderate facet and ligamentum flavum hypertrophy result in mild-to-moderate spinal stenosis, mild bilateral lateral recess stenosis, and moderate right and minimal left neural foraminal stenosis, not significantly changed. L5-S1: Circumferential disc osteophyte complex, disc space height loss, and moderate facet arthrosis result in moderate right and mild to moderate left neural foraminal stenosis without spinal stenosis, unchanged. IMPRESSION: 1. Prior L3-4 PLIF without significant residual stenosis. The L3 pedicle screws slightly traverse the lateral recesses. 2. Adjacent segment disease at L2-3 with moderate spinal stenosis and left greater than right lateral recess stenosis, not significantly changed from the prior MRI. Vacuum disc and appearance on flexion and extension images  are suggestive of some abnormal motion at this level, and the degree of stenosis may slightly worsen with standing. 3. Unchanged, mild-to-moderate spinal stenosis and moderate right neural foraminal stenosis at L4-5. 4. Unchanged, right greater than left foraminal stenosis at L5-S1. Electronically Signed   By: Logan Bores M.D.   On: 07/28/2015 15:21   Dg Myelography Lumbar Inj Lumbosacral  07/28/2015  CLINICAL DATA:  Lumbosacral spondylosis without myelopathy. Chronic low back pain. EXAM: LUMBAR MYELOGRAM FLUOROSCOPY TIME:  Fluoroscopy Time (in minutes and seconds): 0 minutes 56 seconds Number of Acquired Images:  13 PROCEDURE: After thorough discussion of risks and benefits of the procedure including bleeding, infection, injury  to nerves, blood vessels, adjacent structures as well as headache and CSF leak, written and oral informed consent was obtained. Consent was obtained by Dr. Logan Bores. Time out form was completed. Patient was positioned prone on the fluoroscopy table. Local anesthesia was provided with 1% lidocaine without epinephrine after prepped and draped in the usual sterile fashion. Puncture was performed at L5-S1 using a 3 1/2 inch 22-gauge spinal needle via a right paramedian approach. Using a single pass through the dura, the needle was placed within the thecal sac, with return of clear CSF. 15 mL of Omnipaque-180 was injected into the thecal sac, with normal opacification of the nerve roots and cauda equina consistent with free flow within the subarachnoid space. I personally performed the lumbar puncture and administered the intrathecal contrast. I also personally supervised acquisition of the myelogram images. TECHNIQUE: Contiguous axial images were obtained through the Lumbar spine after the intrathecal infusion of contrast. Coronal and sagittal reconstructions were obtained of the axial image sets. COMPARISON:  Lumbar spine MRI 09/06/2013 FINDINGS: LUMBAR MYELOGRAM FINDINGS: Lumbar  dextroscoliosis is again seen. There is no significant listhesis, however there is vacuum disc and a slight rocking motion with flexion at L2-3 which may reflect some abnormal motion at this level. L2-3 disc space height loss also appears more pronounced with standing. Sequelae of L3-4 PLIF are again identified. A moderate-sized ventral extradural defect is present at L2-3 without definite change between prone and upright positioning, although evaluation is partially limited by dilute contrast in this region on the standing images. There is also a moderate-sized ventral extradural defect at L4-5 which at most minimally increases with standing. There is evidence of lateral recess and AP spinal stenosis at both of these levels. The canal appears widely patent at the L3-4 postoperative level. CT LUMBAR MYELOGRAM FINDINGS: The there is mild upper lumbar dextroscoliosis. There is no significant listhesis. Vertebral body heights are preserved without evidence of compression fracture. Sequelae of L3-4 PLIF are again identified. Bilateral L3 and L4 pedicle screws are present without evidence of loosening. Both L3 screws breech the medial cortex of the pedicles and partially traverse the lateral recesses in close proximity to the L3 nerve roots. An interbody spacer is present at L3-4 with evidence of solid osseous fusion across the disc space. There is also solid posterolateral osseous fusion on the left. Advanced disc degeneration is again seen at L4-5 with vacuum disc phenomenon and extensive endplate sclerosis on the right with some associated cystic changes. There is also advanced disc space narrowing at L5-S1 with less pronounced vacuum disc and sclerotic and cystic endplate changes. Prominent vacuum disc and left-sided degenerative endplate changes are present at L2-3. The conus medullaris terminates at L1-2. There is focal nerve root clumping and thickening involving the cauda equina at the L3-4 level. Rather diffuse  atherosclerotic calcification is partially visualized involving the abdominal aorta and its major branch vessels. L1-2: Mild disc bulging and mild facet and ligamentum flavum hypertrophy without stenosis, unchanged. L2-3: Prominent circumferential disc bulging asymmetric to the left and severe facet and ligamentum flavum hypertrophy result in moderate spinal stenosis, mild right and mild-to-moderate left lateral recess stenosis, and minimal left neural foraminal narrowing, not significantly changed from the prior MRI. L3-4: Prior laminectomies and fusion. The spinal canal is widely patent. There is minimal right neural foraminal narrowing due to disc bulging into the inferior foramen, unchanged and without evidence of neural impingement. L4-5: Circumferential disc bulging and moderate facet and ligamentum flavum hypertrophy result in  mild-to-moderate spinal stenosis, mild bilateral lateral recess stenosis, and moderate right and minimal left neural foraminal stenosis, not significantly changed. L5-S1: Circumferential disc osteophyte complex, disc space height loss, and moderate facet arthrosis result in moderate right and mild to moderate left neural foraminal stenosis without spinal stenosis, unchanged. IMPRESSION: 1. Prior L3-4 PLIF without significant residual stenosis. The L3 pedicle screws slightly traverse the lateral recesses. 2. Adjacent segment disease at L2-3 with moderate spinal stenosis and left greater than right lateral recess stenosis, not significantly changed from the prior MRI. Vacuum disc and appearance on flexion and extension images are suggestive of some abnormal motion at this level, and the degree of stenosis may slightly worsen with standing. 3. Unchanged, mild-to-moderate spinal stenosis and moderate right neural foraminal stenosis at L4-5. 4. Unchanged, right greater than left foraminal stenosis at L5-S1. Electronically Signed   By: Logan Bores M.D.   On: 07/28/2015 15:21   I have  personally reviewed and evaluated these images and lab results as part of my medical decision-making.   EKG Interpretation   Date/Time:  Saturday July 29 2015 16:37:25 EDT Ventricular Rate:  62 PR Interval:  161 QRS Duration: 77 QT Interval:  425 QTC Calculation: 432 R Axis:   55 Text Interpretation:  Sinus rhythm No significant change was found  Confirmed by Wyvonnia Dusky  MD, Phenix Vandermeulen [phone]) on 07/29/2015 5:00:43 PM      MDM   Final diagnoses:  Dyspnea  Midline low back pain without sciatica  Urinary tract infection without hematuria, site unspecified   Patient from home with back pain and shortness of breath. Daughter thinks it is due to anxiety. Patient had myelogram yesterday which showed severe spinal stenosis. No chest pain, cough or fever.  Patient has equal strength in her lower extremities. There are no new motor deficits. Has some stool incontinence at baseline.   Myelogram yesterday showed spinal stenosis not significantly changed from previous.  Patient's workup is reassuring. Her chest x-rays negative. She no longer has any shortness of breath or chest pain. Her labs are unremarkable. Suspect component of anxiety.  D-dimer is negative. Troponin is negative 2. Patient is 100 percent on room air. She is able to ambulate. She is tolerating by mouth. She denies any suicidal or homicidal thoughts. Her back pain is at baseline. Followup with PCP, Treat UTI.  Return precautions discussed.  BP 124/65 mmHg  Pulse 71  Temp(Src) 97.5 F (36.4 C)  Resp 18  SpO2 94%       Ezequiel Essex, MD 07/30/15 1315

## 2015-07-29 NOTE — ED Notes (Signed)
Pt c/o back pain and anxiety. EMS reports pt daughter stated pt had been off of her Effexor for a few days to had a procedure done and started it back again today. nad noted upon pt arrival to ED.

## 2015-08-14 DIAGNOSIS — H903 Sensorineural hearing loss, bilateral: Secondary | ICD-10-CM | POA: Diagnosis not present

## 2015-08-16 DIAGNOSIS — Z6829 Body mass index (BMI) 29.0-29.9, adult: Secondary | ICD-10-CM | POA: Diagnosis not present

## 2015-10-09 DIAGNOSIS — J00 Acute nasopharyngitis [common cold]: Secondary | ICD-10-CM | POA: Diagnosis not present

## 2015-10-09 DIAGNOSIS — R05 Cough: Secondary | ICD-10-CM | POA: Diagnosis not present

## 2015-10-26 DIAGNOSIS — F339 Major depressive disorder, recurrent, unspecified: Secondary | ICD-10-CM | POA: Diagnosis not present

## 2015-10-26 DIAGNOSIS — R531 Weakness: Secondary | ICD-10-CM | POA: Diagnosis not present

## 2015-10-26 DIAGNOSIS — R000 Tachycardia, unspecified: Secondary | ICD-10-CM | POA: Diagnosis not present

## 2015-10-26 DIAGNOSIS — J00 Acute nasopharyngitis [common cold]: Secondary | ICD-10-CM | POA: Diagnosis not present

## 2015-11-06 DIAGNOSIS — M47817 Spondylosis without myelopathy or radiculopathy, lumbosacral region: Secondary | ICD-10-CM | POA: Diagnosis not present

## 2015-11-06 DIAGNOSIS — M5417 Radiculopathy, lumbosacral region: Secondary | ICD-10-CM | POA: Diagnosis not present

## 2015-11-08 ENCOUNTER — Encounter (HOSPITAL_COMMUNITY): Payer: Self-pay

## 2015-11-20 ENCOUNTER — Ambulatory Visit (HOSPITAL_COMMUNITY)
Admission: RE | Admit: 2015-11-20 | Discharge: 2015-11-20 | Disposition: A | Discharge status: Home / Self Care | Payer: Medicare Other | Source: Ambulatory Visit | Attending: Internal Medicine | Admitting: Internal Medicine

## 2015-11-20 DIAGNOSIS — R000 Tachycardia, unspecified: Secondary | ICD-10-CM | POA: Diagnosis not present

## 2015-11-20 DIAGNOSIS — I471 Supraventricular tachycardia: Secondary | ICD-10-CM

## 2015-11-28 DIAGNOSIS — H903 Sensorineural hearing loss, bilateral: Secondary | ICD-10-CM | POA: Diagnosis not present

## 2015-11-29 DIAGNOSIS — M47817 Spondylosis without myelopathy or radiculopathy, lumbosacral region: Secondary | ICD-10-CM | POA: Diagnosis not present

## 2015-11-29 DIAGNOSIS — M5417 Radiculopathy, lumbosacral region: Secondary | ICD-10-CM | POA: Diagnosis not present

## 2015-11-29 DIAGNOSIS — Z6829 Body mass index (BMI) 29.0-29.9, adult: Secondary | ICD-10-CM | POA: Diagnosis not present

## 2015-12-06 DIAGNOSIS — R531 Weakness: Secondary | ICD-10-CM | POA: Diagnosis not present

## 2015-12-06 DIAGNOSIS — F339 Major depressive disorder, recurrent, unspecified: Secondary | ICD-10-CM | POA: Diagnosis not present

## 2015-12-06 DIAGNOSIS — F5101 Primary insomnia: Secondary | ICD-10-CM | POA: Diagnosis not present

## 2015-12-06 DIAGNOSIS — Z23 Encounter for immunization: Secondary | ICD-10-CM | POA: Diagnosis not present

## 2016-02-08 DIAGNOSIS — M47817 Spondylosis without myelopathy or radiculopathy, lumbosacral region: Secondary | ICD-10-CM | POA: Diagnosis not present

## 2016-02-08 DIAGNOSIS — M5417 Radiculopathy, lumbosacral region: Secondary | ICD-10-CM | POA: Diagnosis not present

## 2016-02-20 DIAGNOSIS — H903 Sensorineural hearing loss, bilateral: Secondary | ICD-10-CM | POA: Diagnosis not present

## 2016-04-24 ENCOUNTER — Other Ambulatory Visit: Payer: Self-pay | Admitting: Gastroenterology

## 2016-06-13 DIAGNOSIS — I10 Essential (primary) hypertension: Secondary | ICD-10-CM | POA: Diagnosis not present

## 2016-06-13 DIAGNOSIS — E782 Mixed hyperlipidemia: Secondary | ICD-10-CM | POA: Diagnosis not present

## 2016-06-18 ENCOUNTER — Other Ambulatory Visit: Payer: Self-pay

## 2016-06-18 DIAGNOSIS — R5383 Other fatigue: Secondary | ICD-10-CM | POA: Diagnosis not present

## 2016-06-18 DIAGNOSIS — E782 Mixed hyperlipidemia: Secondary | ICD-10-CM | POA: Diagnosis not present

## 2016-06-18 DIAGNOSIS — R0600 Dyspnea, unspecified: Secondary | ICD-10-CM | POA: Diagnosis not present

## 2016-06-18 DIAGNOSIS — F339 Major depressive disorder, recurrent, unspecified: Secondary | ICD-10-CM | POA: Diagnosis not present

## 2016-06-18 DIAGNOSIS — R7989 Other specified abnormal findings of blood chemistry: Secondary | ICD-10-CM | POA: Diagnosis not present

## 2016-06-18 DIAGNOSIS — Z23 Encounter for immunization: Secondary | ICD-10-CM | POA: Diagnosis not present

## 2016-06-18 DIAGNOSIS — I10 Essential (primary) hypertension: Secondary | ICD-10-CM | POA: Diagnosis not present

## 2016-06-19 ENCOUNTER — Telehealth: Payer: Self-pay

## 2016-06-19 ENCOUNTER — Telehealth: Payer: Self-pay | Admitting: Gastroenterology

## 2016-06-19 MED ORDER — DICYCLOMINE HCL 10 MG PO CAPS: ORAL_CAPSULE | ORAL | 11 refills | Status: DC

## 2016-06-19 NOTE — Telephone Encounter (Signed)
PT DAUGHTER CALLED AGAIN STATING THEY WERE AT THE PHARMACY WAITING AND I EXPLAINED TO HER THAT IT WOULD BE ADDRESSED AS SOON AS THE PROVIDER HAD A CHANCE

## 2016-06-19 NOTE — Telephone Encounter (Signed)
Forwarding to the refill box.  

## 2016-06-19 NOTE — Telephone Encounter (Signed)
Done

## 2016-06-19 NOTE — Telephone Encounter (Signed)
done

## 2016-06-19 NOTE — Telephone Encounter (Signed)
Daughter called to check on Bentyl prescription. She said the pharmacy hasn't received it yet. I told her that we received a refill request yesterday and it's still pending and the provider would be getting to it.

## 2016-06-20 DIAGNOSIS — H524 Presbyopia: Secondary | ICD-10-CM | POA: Diagnosis not present

## 2016-06-20 DIAGNOSIS — H52223 Regular astigmatism, bilateral: Secondary | ICD-10-CM | POA: Diagnosis not present

## 2016-06-20 DIAGNOSIS — H3530 Unspecified macular degeneration: Secondary | ICD-10-CM | POA: Diagnosis not present

## 2016-06-20 DIAGNOSIS — H5202 Hypermetropia, left eye: Secondary | ICD-10-CM | POA: Diagnosis not present

## 2016-06-25 DIAGNOSIS — M47817 Spondylosis without myelopathy or radiculopathy, lumbosacral region: Secondary | ICD-10-CM | POA: Diagnosis not present

## 2016-06-25 DIAGNOSIS — M5417 Radiculopathy, lumbosacral region: Secondary | ICD-10-CM | POA: Diagnosis not present

## 2016-07-08 DIAGNOSIS — H903 Sensorineural hearing loss, bilateral: Secondary | ICD-10-CM | POA: Diagnosis not present

## 2016-07-10 DIAGNOSIS — H903 Sensorineural hearing loss, bilateral: Secondary | ICD-10-CM | POA: Diagnosis not present

## 2016-07-16 IMAGING — CT CT TEMPORAL BONES W/O CM
4 of 6 series · 17 of 30 positions shown, 18 images · non-contrast
Comparison: None.

CLINICAL DATA: Sensorineural hearing loss common bilateral.
Bilateral hearing loss for 15 years. Preoperative evaluation for
cochlear implant.

EXAM:
CT TEMPORAL BONES WITHOUT CONTRAST
TECHNIQUE: Axial and coronal plane CT imaging of the petrous temporal bones was
performed with thin-collimation image reconstruction. No intravenous
contrast was administered. Multiplanar CT image reconstructions were
also generated.

[Series 3: ax mag right · axial · 0.20mm/px · z∈[-34,-18]mm · 2 of 161 slices shown]
[im 54/161  bone]
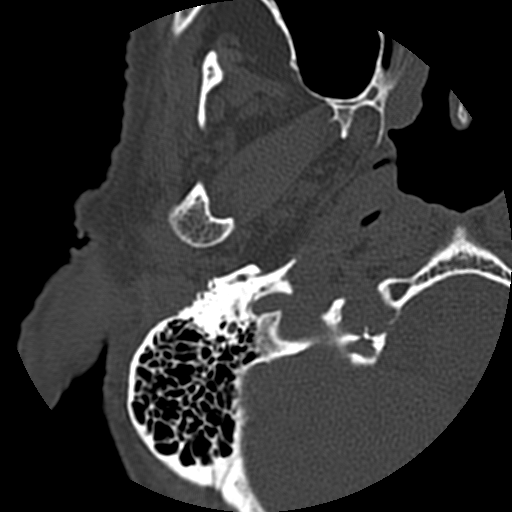
[im 107/161  bone]
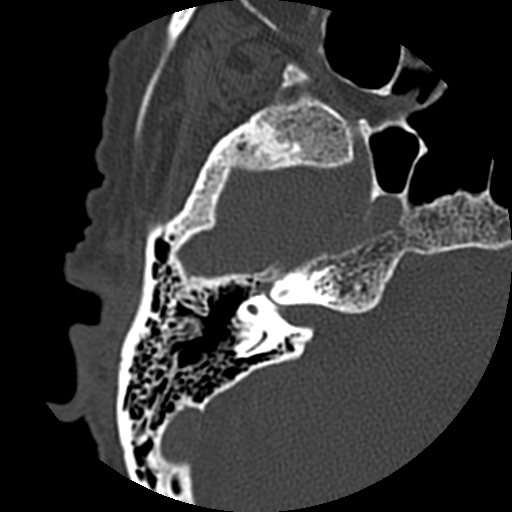

[Series 200: cor bone · coronal · 0.33mm/px · 2 of 137 slices shown, 3 images]
[im 46/137  brain]
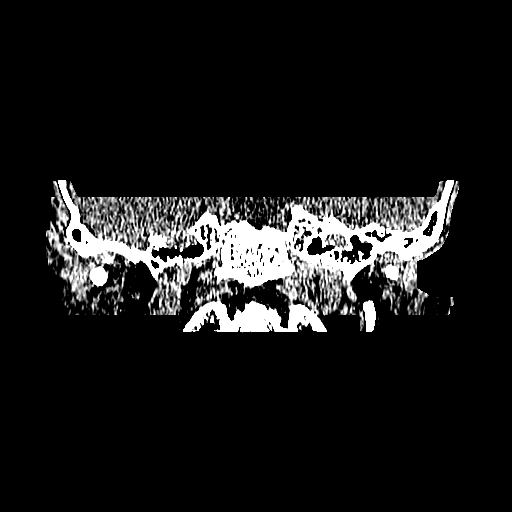
[im 46/137  bone]
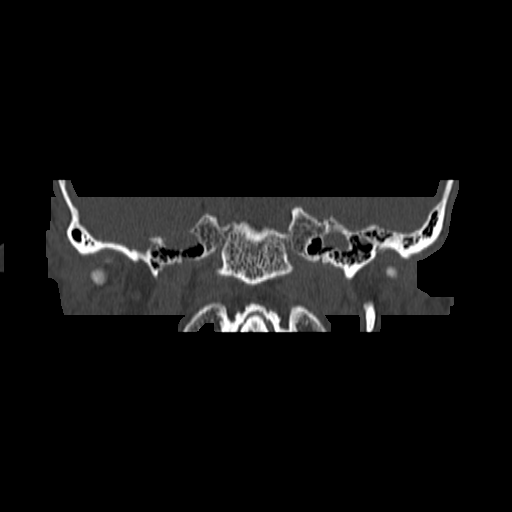
[im 91/137  bone]
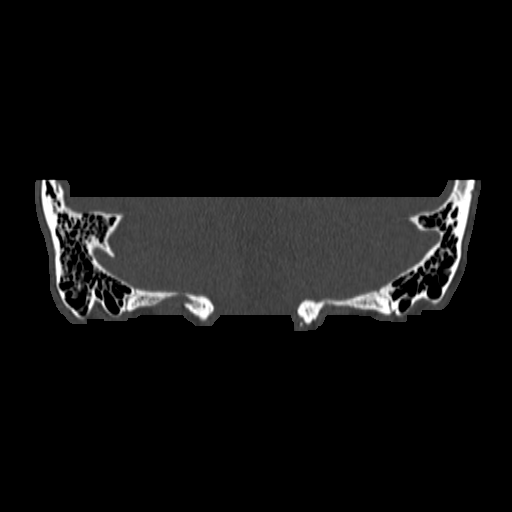

[Series 300: cor mag right bone · coronal · 0.20mm/px · 7 of 347 slices shown]
[im 44/347  bone]
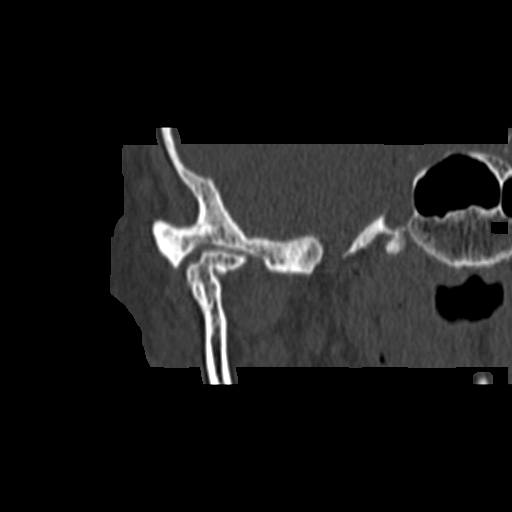
[im 87/347  bone]
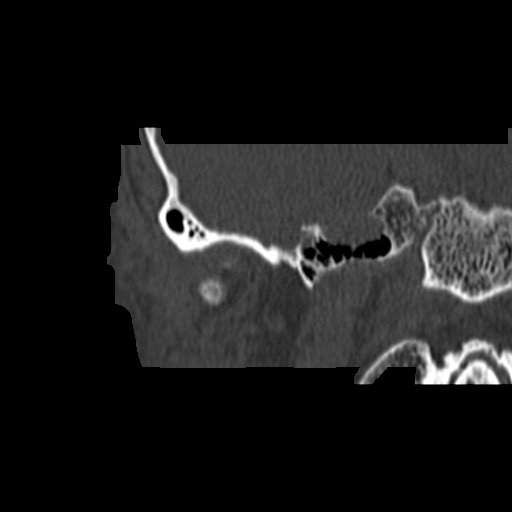
[im 130/347  bone]
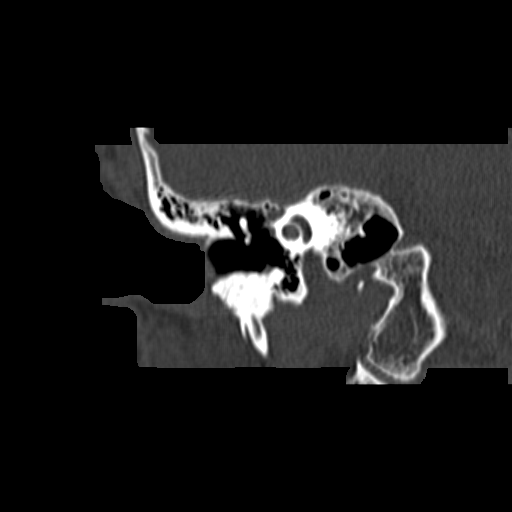
[im 174/347  bone]
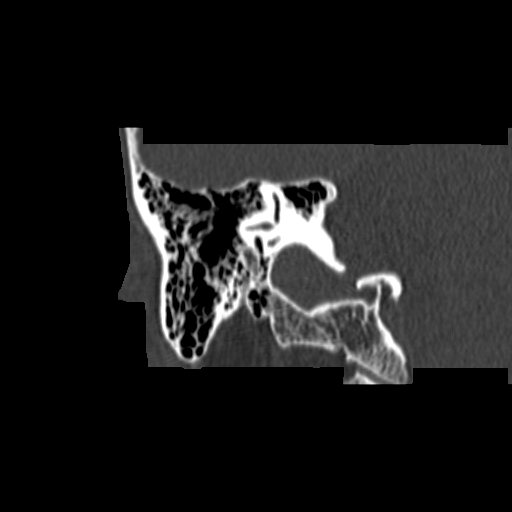
[im 217/347  bone]
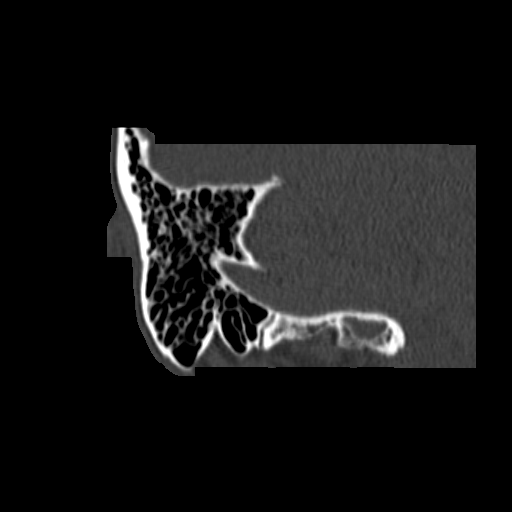
[im 260/347  bone]
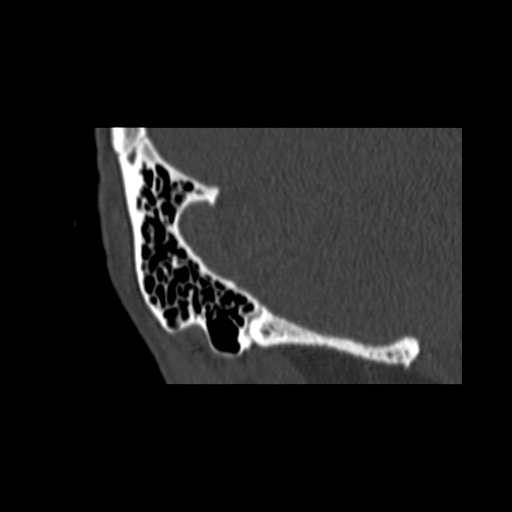
[im 303/347  bone]
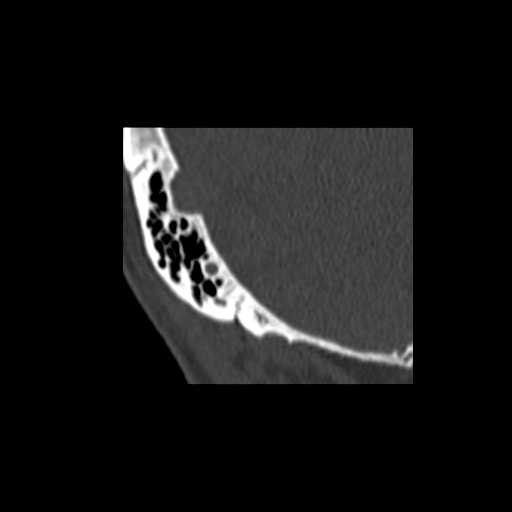

[Series 400: cor mag left bone · coronal · 0.20mm/px · 6 of 321 slices shown]
[im 46/321  bone]
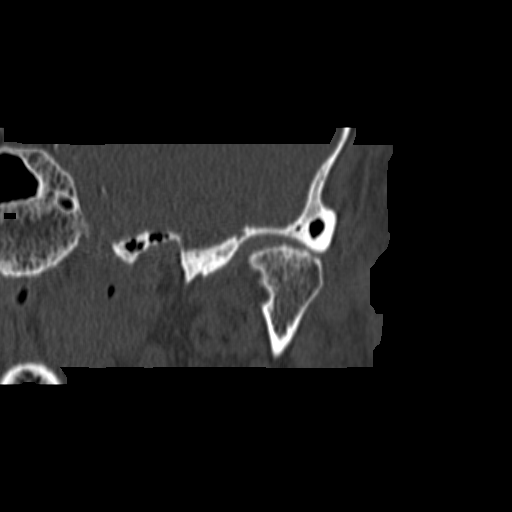
[im 92/321  bone]
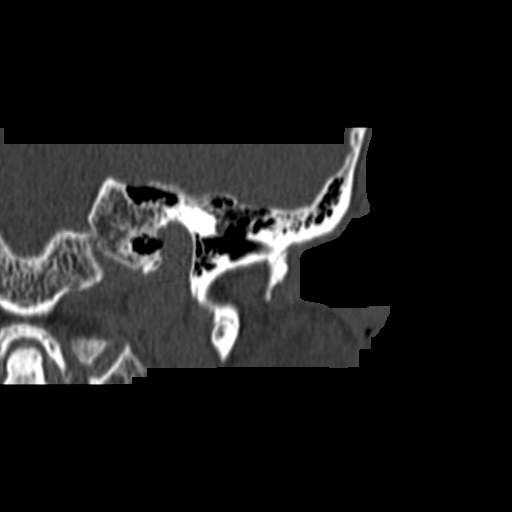
[im 138/321  bone]
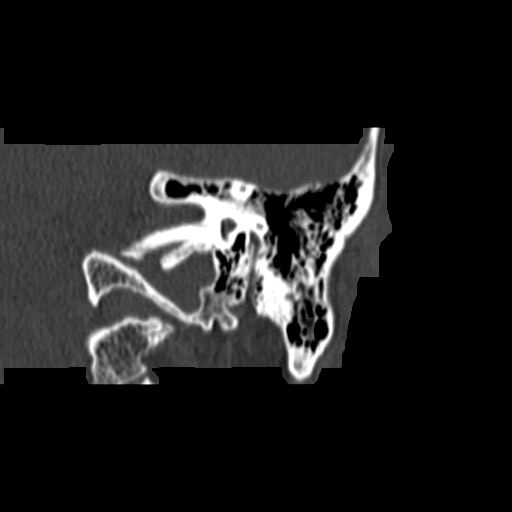
[im 183/321  bone]
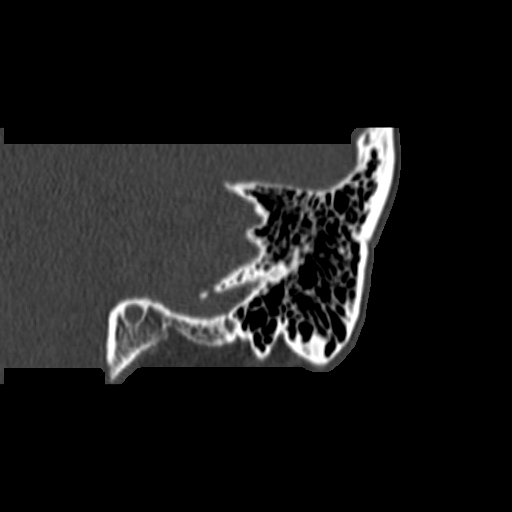
[im 229/321  bone]
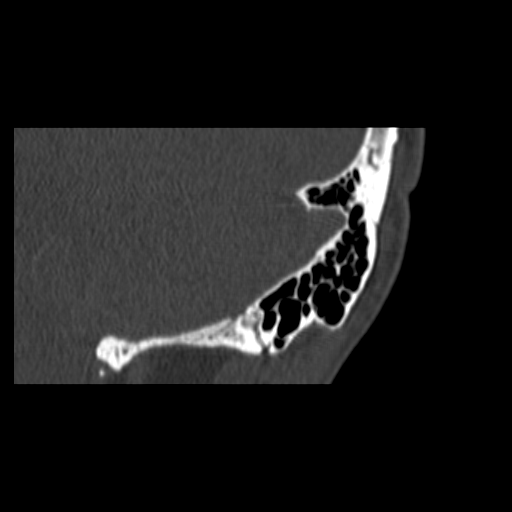
[im 275/321  bone]
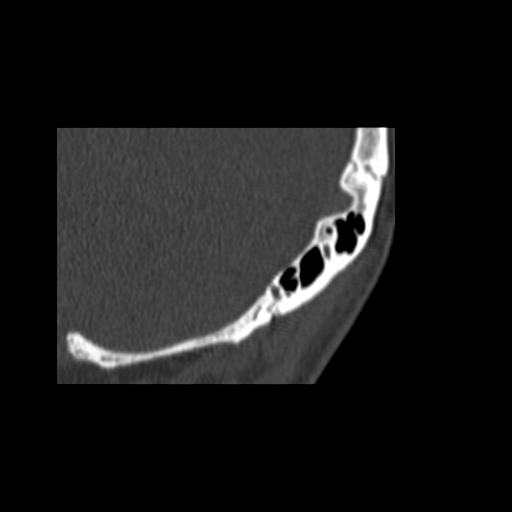

[17 of 30 positions shown; findings below may reference images not displayed]

FINDINGS: Limited imaging of the brain is unremarkable. The visualized
paranasal sinuses are clear.

The right external auditory canal is clear. There is mild narrowing
of the medial membranous component. The tympanic membrane is intact.
The middle ear ossicles are new normally formed and articulating.
The oval window is patent. The middle ear cavity and right mastoid
air cells are clear. The vestibule is mildly prominent measuring
mm compared to 1.7 mm of the posterior semicircular canal. The
cochlea is normally formed with 2.5 turns. The jugular bulb is
somewhat high-riding. The sigmoid plate is intact. The internal
auditory canal and vestibular aqueduct are normal.

The left external auditory canal is clear. There is mild narrowing
of the medial membranous component. The tympanic membrane is intact.
The middle ear ossicles are new normally formed and articulating.
The oval window is patent. The middle ear cavity and left mastoid
air cells are clear. The vestibule is mildly prominent measuring
mm compared to 1.7 mm of the posterior semicircular canal. The
cochlea is normally formed with 2.5 turns. The jugular bulb is
somewhat high-riding. The sigmoid plate is intact. The internal
auditory canal and vestibular aqueduct are normal.
IMPRESSION: 1. Mild prominence of the vestibule bilaterally, slightly larger on
the right.
2. Normal appearance of the cochlea bilaterally.
3. Borderline high-riding jugular bulb bilaterally. The sigmoid
plate is intact bilaterally.
4. Normal appearance of the middle ear ossicles. No significant
middle ear or mastoid effusion is present.

## 2016-07-23 DIAGNOSIS — Z6828 Body mass index (BMI) 28.0-28.9, adult: Secondary | ICD-10-CM | POA: Diagnosis not present

## 2016-07-23 DIAGNOSIS — M5417 Radiculopathy, lumbosacral region: Secondary | ICD-10-CM | POA: Diagnosis not present

## 2016-07-23 DIAGNOSIS — M47817 Spondylosis without myelopathy or radiculopathy, lumbosacral region: Secondary | ICD-10-CM | POA: Diagnosis not present

## 2016-10-15 DIAGNOSIS — M5417 Radiculopathy, lumbosacral region: Secondary | ICD-10-CM | POA: Diagnosis not present

## 2016-10-15 DIAGNOSIS — M47817 Spondylosis without myelopathy or radiculopathy, lumbosacral region: Secondary | ICD-10-CM | POA: Diagnosis not present

## 2016-12-16 DIAGNOSIS — R7301 Impaired fasting glucose: Secondary | ICD-10-CM | POA: Diagnosis not present

## 2016-12-16 DIAGNOSIS — I10 Essential (primary) hypertension: Secondary | ICD-10-CM | POA: Diagnosis not present

## 2016-12-16 DIAGNOSIS — E782 Mixed hyperlipidemia: Secondary | ICD-10-CM | POA: Diagnosis not present

## 2016-12-18 DIAGNOSIS — R69 Illness, unspecified: Secondary | ICD-10-CM | POA: Diagnosis not present

## 2016-12-18 DIAGNOSIS — I10 Essential (primary) hypertension: Secondary | ICD-10-CM | POA: Diagnosis not present

## 2016-12-18 DIAGNOSIS — E8809 Other disorders of plasma-protein metabolism, not elsewhere classified: Secondary | ICD-10-CM | POA: Diagnosis not present

## 2016-12-18 DIAGNOSIS — R531 Weakness: Secondary | ICD-10-CM | POA: Diagnosis not present

## 2016-12-18 DIAGNOSIS — F339 Major depressive disorder, recurrent, unspecified: Secondary | ICD-10-CM | POA: Diagnosis not present

## 2016-12-18 DIAGNOSIS — R5383 Other fatigue: Secondary | ICD-10-CM | POA: Diagnosis not present

## 2016-12-18 DIAGNOSIS — R0600 Dyspnea, unspecified: Secondary | ICD-10-CM | POA: Diagnosis not present

## 2016-12-18 DIAGNOSIS — E782 Mixed hyperlipidemia: Secondary | ICD-10-CM | POA: Diagnosis not present

## 2016-12-29 ENCOUNTER — Emergency Department (HOSPITAL_COMMUNITY): Payer: Medicare HMO

## 2016-12-29 ENCOUNTER — Encounter (HOSPITAL_COMMUNITY): Payer: Self-pay | Admitting: Emergency Medicine

## 2016-12-29 ENCOUNTER — Emergency Department (HOSPITAL_COMMUNITY)
Admission: EM | Admit: 2016-12-29 | Discharge: 2016-12-29 | Disposition: A | Discharge status: Home / Self Care | Payer: Medicare HMO | Attending: Emergency Medicine | Admitting: Emergency Medicine

## 2016-12-29 DIAGNOSIS — Z87891 Personal history of nicotine dependence: Secondary | ICD-10-CM | POA: Insufficient documentation

## 2016-12-29 DIAGNOSIS — J40 Bronchitis, not specified as acute or chronic: Secondary | ICD-10-CM | POA: Diagnosis not present

## 2016-12-29 DIAGNOSIS — I10 Essential (primary) hypertension: Secondary | ICD-10-CM | POA: Diagnosis not present

## 2016-12-29 DIAGNOSIS — R0602 Shortness of breath: Secondary | ICD-10-CM | POA: Diagnosis present

## 2016-12-29 DIAGNOSIS — R05 Cough: Secondary | ICD-10-CM | POA: Diagnosis not present

## 2016-12-29 DIAGNOSIS — R069 Unspecified abnormalities of breathing: Secondary | ICD-10-CM | POA: Diagnosis not present

## 2016-12-29 DIAGNOSIS — J209 Acute bronchitis, unspecified: Secondary | ICD-10-CM | POA: Diagnosis not present

## 2016-12-29 LAB — CBC WITH DIFFERENTIAL/PLATELET
BASOS PCT: 0 %
Basophils Absolute: 0 10*3/uL (ref 0.0–0.1)
Eosinophils Absolute: 0.1 10*3/uL (ref 0.0–0.7)
Eosinophils Relative: 1 %
HCT: 37.6 % (ref 36.0–46.0)
HEMOGLOBIN: 12.6 g/dL (ref 12.0–15.0)
Lymphocytes Relative: 15 %
Lymphs Abs: 1.4 10*3/uL (ref 0.7–4.0)
MCH: 29 pg (ref 26.0–34.0)
MCHC: 33.5 g/dL (ref 30.0–36.0)
MCV: 86.4 fL (ref 78.0–100.0)
MONOS PCT: 7 %
Monocytes Absolute: 0.6 10*3/uL (ref 0.1–1.0)
NEUTROS ABS: 6.8 10*3/uL (ref 1.7–7.7)
NEUTROS PCT: 77 %
Platelets: 215 10*3/uL (ref 150–400)
RBC: 4.35 MIL/uL (ref 3.87–5.11)
RDW: 13.5 % (ref 11.5–15.5)
WBC: 8.8 10*3/uL (ref 4.0–10.5)

## 2016-12-29 LAB — COMPREHENSIVE METABOLIC PANEL
ALBUMIN: 3.3 g/dL — AB (ref 3.5–5.0)
ALK PHOS: 80 U/L (ref 38–126)
ALT: 19 U/L (ref 14–54)
ANION GAP: 9 (ref 5–15)
AST: 27 U/L (ref 15–41)
BILIRUBIN TOTAL: 0.7 mg/dL (ref 0.3–1.2)
BUN: 10 mg/dL (ref 6–20)
CALCIUM: 8.6 mg/dL — AB (ref 8.9–10.3)
CO2: 24 mmol/L (ref 22–32)
CREATININE: 0.68 mg/dL (ref 0.44–1.00)
Chloride: 102 mmol/L (ref 101–111)
GFR calc Af Amer: >60 mL/min (ref >60)
GFR calc non Af Amer: >60 mL/min (ref >60)
GLUCOSE: 116 mg/dL — AB (ref 65–99)
Potassium: 3.5 mmol/L (ref 3.5–5.1)
Sodium: 135 mmol/L (ref 135–145)
TOTAL PROTEIN: 6.4 g/dL — AB (ref 6.5–8.1)

## 2016-12-29 LAB — TROPONIN I: Troponin I: <0.03 ng/mL (ref <0.03)

## 2016-12-29 MED ORDER — DOXYCYCLINE HYCLATE 100 MG PO CAPS: 100.0000 mg | ORAL_CAPSULE | Freq: Two times a day (BID) | ORAL | 0 refills | Status: DC

## 2016-12-29 MED ORDER — ONDANSETRON HCL 4 MG/2ML IJ SOLN
4.0000 mg | Freq: Once | INTRAMUSCULAR | Status: AC
  Administered 2016-12-29: 4 mg via INTRAVENOUS
  Filled 2016-12-29: qty 2

## 2016-12-29 MED ORDER — ALBUTEROL SULFATE HFA 108 (90 BASE) MCG/ACT IN AERS
2.0000 | INHALATION_SPRAY | Freq: Once | RESPIRATORY_TRACT | Status: AC
  Administered 2016-12-29: 2 via RESPIRATORY_TRACT
  Filled 2016-12-29: qty 6.7

## 2016-12-29 MED ORDER — DOXYCYCLINE HYCLATE 100 MG PO TABS
100.0000 mg | ORAL_TABLET | Freq: Once | ORAL | Status: AC
  Administered 2016-12-29: 100 mg via ORAL
  Filled 2016-12-29: qty 1

## 2016-12-29 MED ORDER — IOPAMIDOL (ISOVUE-370) INJECTION 76%
100.0000 mL | Freq: Once | INTRAVENOUS | Status: AC | PRN
Start: 2016-12-29 — End: 2016-12-29
  Administered 2016-12-29: 100 mL via INTRAVENOUS

## 2016-12-29 NOTE — ED Notes (Signed)
Patient transported to X-ray 

## 2016-12-29 NOTE — ED Notes (Signed)
Patient transported to CT 

## 2016-12-29 NOTE — ED Notes (Signed)
Pt vomiting at this time. Dr Dayna Barker notified and gave order for Zofran IV

## 2016-12-29 NOTE — ED Provider Notes (Signed)
[facility] DEPT Provider Note   CSN: [id number] Arrival date & time: 12/29/16  1026   By signing my name below, I, Avnee Patel, attest that this documentation has been prepared under the direction and in the presence of Merrily Pew, MD  Electronically Signed: Delton [place], ED Scribe. 12/29/16. 10:56 AM.   History   Chief Complaint Chief Complaint  Patient presents with  . Shortness of Breath    HPI Comments:  Whitney Galloway is a 83 y.o. female, with a PMHx of HTN, who presents to the Emergency Department complaining of acute onset productive cough and SOB x 2 weeks. Pt also reports subjective fever, back pain, chest pain, abdominal pain secondary to coughing. No alleviating or modifying factors noted. Pt denies any other associated symptoms. No other complaints noted.   The history is provided by the patient. No language interpreter was used.    Past Medical History:  Diagnosis Date  . Bowel incontinence   . Colon polyp 1994 Malignant polyp   2005 NL BE; 2006 incomplete TCS DUE TO REDUNDNACY  . Diverticulosis   . Fibromyalgia   . Gastritis   . Gastroparesis 2009   65% RETENTION AT 2 HOURS  . GERD (gastroesophageal reflux disease)   . Hard of hearing   . Hernia of unspecified site of abdominal cavity without mention of obstruction or gangrene   . Hiatal hernia   . HIATAL HERNIA 06/23/2008   Qualifier: Diagnosis of  By: Craige Cotta    . HTN (hypertension)   . IBS (irritable bowel syndrome)   . Osteoporosis   . S/P endoscopy 2007, 2009   2007: single distal esophageal erosion, s/p 67 F dilation, no web/ring/stricture noted; 2009: normal, Bravo with adequate suppression on Prevacid BID  . Splenic lesion HAMARTOMA  . TB (tuberculosis) AGE 79  . Wears dentures    top  . Wears glasses   . Wears hearing aid     Patient Active Problem List   Diagnosis Date Noted  . Postop check 09/21/2014  . Breast cancer, 1971, status post right lumpectomy 01/04/2014  .  Hemorrhoids, internal, with bleeding 10/14/2013  . Anemia 09/09/2013  . History of spinal surgery 12/17/2012  . Difficulty hearing 12/17/2012  . Herniated nucleus pulposus, L3-4 with stenosis and spondylolisthesis 10/20/2012  . Night sweats 12/26/2011  . Stiffness of joints, not elsewhere classified, multiple sites 12/19/2011  . Weakness of left leg 12/19/2011  . Difficulty in walking(719.7) 12/19/2011  . GASTROPARESIS 11/03/2009  . COLONIC POLYPS, ADENOMATOUS 06/23/2008  . UNSPECIFIED DISEASE OF SPLEEN 06/23/2008  . DEPRESSION/ANXIETY 06/23/2008  . DECREASED HEARING 06/23/2008  . HYPERTENSION 06/23/2008  . ALLERGIC RHINITIS, SEASONAL 06/23/2008  . GASTROESOPHAGEAL REFLUX DISEASE, CHRONIC 06/23/2008  . GASTRITIS 06/23/2008  . DUODENITIS 06/23/2008  . DIVERTICULOSIS OF COLON 06/23/2008  . CONSTIPATION, CHRONIC 06/23/2008  . IRRITABLE BOWEL SYNDROME 06/23/2008  . ECZEMA 06/23/2008  . OSTEOARTHRITIS, SPINE 06/23/2008  . DEGENERATIVE DISC DISEASE 06/23/2008  . NAUSEA AND VOMITING 06/23/2008  . OTHER DYSPHAGIA 06/23/2008  . Incontinence of feces 06/23/2008  . PUD, HX OF 06/23/2008  . TOBACCO USE, QUIT 06/23/2008    Past Surgical History:  Procedure Laterality Date  . BACK SURGERY  2014   lumbar lam  . Benign tumor removed from bilateral breast    . cataract surgery    . COCHLEAR IMPLANT Left 09/21/2014   Procedure: COCHLEAR IMPLANT LEFT EAR;  Surgeon: Fannie Knee, MD;  Location: [facility];  Service: ENT;  Laterality:  Left;  . COLONOSCOPY W/ POLYPECTOMY    . ESOPHAGOGASTRODUODENOSCOPY  08/27/2011   hiatal hernia/mild gastritis  . ESOPHAGOGASTRODUODENOSCOPY  05/25/2008    Normal esophagus without evidence of Barrett mass/  Normal stomach, duodenal bulb, and second portion of the duodenum  . ESOPHAGOGASTRODUODENOSCOPY  08/11/2006   Single erosion at distal esophagus and a small sliding hiatal hernia/ No evidence of ring or stricture but esophagus was dilated by  passing 67 [place] Maloney dilator/ Prepyloric antral erythema but no evidence of erosions or peptic ulcer disease.  . ESOPHAGOGASTRODUODENOSCOPY  09/03/2005   Small sliding hiatal hernia without evidence of ring or  stricture formation. Esophagus dilated by passing 54-French Maloney dilator   given history of dysphagia  . GIVENS CAPSULE STUDY  05/27/2008   Adequate acid suppression with proton pump inhibitor.  [name] LEFT WRIST CYST REMOVAL    . LUNG SURGERY for pulmonary nodules    . Mass removed from roof of mouth    . SAVORY DILATION  08/27/2011   stricture in the distal esophagus  . UPPER GASTROINTESTINAL ENDOSCOPY  2006  . X-STOP IMPLANTATION      OB History    No data available       Home Medications    Prior to Admission medications   Medication Sig Start Date End Date Taking? Authorizing Provider  calcium carbonate (TUMS - DOSED IN MG ELEMENTAL CALCIUM) 500 MG chewable tablet Chew 1 tablet by mouth as needed. gas   Yes Historical Provider, MD  dicyclomine (BENTYL) 10 MG capsule 1 po every other night followed by another dose in the morning. YOU MAY USE ONE AT 12 NOON IF NEEDED. 06/19/16  Yes Mahala Menghini, PA-C  fexofenadine (ALLEGRA) 180 MG tablet Take 180 mg by mouth daily.   Yes Historical Provider, MD  LORazepam (ATIVAN) 1 MG tablet Take 1 mg by mouth at bedtime.    Yes Historical Provider, MD  Multiple Vitamin (MULTIVITAMIN WITH MINERALS) TABS tablet Take 1 tablet by mouth daily.   Yes Historical Provider, MD  multivitamin-lutein (OCUVITE-LUTEIN) CAPS Take 1 capsule by mouth daily.    Yes Historical Provider, MD  mupirocin ointment (BACTROBAN) 2 % Apply 1 application topically daily as needed. 07/12/15  Yes Historical Provider, MD  naproxen sodium (ALEVE) 220 MG tablet Take 220 mg by mouth 2 (two) times daily as needed (Pain).   Yes Historical Provider, MD  pantoprazole (PROTONIX) 40 MG tablet Take 40 mg by mouth daily.  12/18/16  Yes Historical Provider, MD  Polyethyl  Glycol-Propyl Glycol (SYSTANE OP) Place 1 drop into both eyes daily.   Yes Historical Provider, MD  TOPROL XL 25 MG 24 hr tablet Take 25 mg by mouth daily. 07/18/15  Yes Historical Provider, MD  venlafaxine XR (EFFEXOR-XR) 150 MG 24 hr capsule Take 150 mg by mouth daily.   Yes Historical Provider, MD  doxycycline (VIBRAMYCIN) 100 MG capsule Take 1 capsule (100 mg total) by mouth 2 (two) times daily. One po bid x 7 days 12/29/16   Merrily Pew, MD    Family History No family history on file.  Social History Social History  Substance Use Topics  . Smoking status: Former Smoker    Quit date: 09/16/2004  . Smokeless tobacco: Never Used     Comment: Quit x 8 years  . Alcohol use No     Allergies   Patient has no known allergies.   Review of Systems Review of Systems  Constitutional: Positive for fever (subjective ).  Respiratory:  Positive for cough and shortness of breath.   Musculoskeletal: Positive for back pain and myalgias.  All other systems reviewed and are negative.  Physical Exam Updated Vital Signs BP (!) 130/92   Pulse 97   Temp 98.6 F (37 C) (Oral)   Resp 18   Ht 5\' 8"  (1.727 m)   Wt 150 lb (68 kg)   SpO2 96%   BMI 22.81 kg/m   Physical Exam  Constitutional: She is oriented to person, place, and time. She appears well-developed and well-nourished. No distress.  HENT:  Head: Normocephalic and atraumatic.  Eyes: Conjunctivae are normal.  Cardiovascular: Regular rhythm.  Tachycardia present.  Exam reveals no gallop and no friction rub.   No murmur heard. Pulmonary/Chest: Effort normal.  Crackles in the right middle lobe  Abdominal: She exhibits no distension.  Neurological: She is alert and oriented to person, place, and time.  Skin: Skin is warm and dry.  Psychiatric: She has a normal mood and affect.  Nursing note and vitals reviewed.   ED Treatments / Results  DIAGNOSTIC STUDIES:  Oxygen Saturation is 92% on [place], low by my interpretation.     COORDINATION OF CARE:  10:44 AM Discussed treatment plan with pt at bedside and pt agreed to plan.  Labs (all labs ordered are listed, but only abnormal results are displayed) Labs Reviewed  COMPREHENSIVE METABOLIC PANEL - Abnormal; Notable for the following:       Result Value   Glucose, Bld 116 (*)    Calcium 8.6 (*)    Total Protein 6.4 (*)    Albumin 3.3 (*)    All other components within normal limits  CBC WITH DIFFERENTIAL/PLATELET  TROPONIN I    EKG  EKG Interpretation None       Radiology Dg Chest 2 View  Result Date: 12/29/2016 CLINICAL DATA:  Shortness of breath with cough.  Fever. EXAM: CHEST  2 VIEW COMPARISON:  July 29, 2015 FINDINGS: There is postoperative change in the right upper lobe. There are areas of scarring and volume loss in the right upper lobe. The lungs elsewhere are clear. No edema or consolidation. Heart size and pulmonary vascularity are normal. No adenopathy. There is atherosclerotic calcification aorta. No bone lesions. IMPRESSION: Scarring with postoperative change right upper lobe. No edema or consolidation. No adenopathy. There is aortic atherosclerosis. Electronically Signed   By: Lowella Grip III M.D.   On: 12/29/2016 11:41   Ct Angio Chest Pe W And/or Wo Contrast  Result Date: 12/29/2016 CLINICAL DATA:  Cough and shortness of breath for 2 weeks EXAM: CT ANGIOGRAPHY CHEST WITH CONTRAST TECHNIQUE: Multidetector CT imaging of the chest was performed using the standard protocol during bolus administration of intravenous contrast. Multiplanar CT image reconstructions and MIPs were obtained to evaluate the vascular anatomy. CONTRAST:  100 mL Isovue 370 nonionic COMPARISON:  Chest radiograph December 29, 2016 FINDINGS: Cardiovascular: There is no demonstrable pulmonary embolus. There is no thoracic aortic aneurysm or dissection. There are foci of calcification in the proximal great vessels bilaterally. There is moderate atherosclerotic  calcification in the aorta. There are foci of coronary artery calcification. The pericardium is not appreciably thickened. Mediastinum/Nodes: There are multiple subcentimeter nodular lesions in the thyroid without dominant mass. There are several mediastinal lymph nodes. In the right hilum, there is a lymph node measuring 1.3 x 1.1 cm. In the left hilum, there is a lymph node measuring 1.2 x 0.8 cm. In the sub- carinal region, there is a lymph node  measuring 1.3 x 1.1 cm. Immediately anterior to the carina on the left, there is a a lymph node measuring 1.0 x 0.9 cm. There is a small hiatal hernia with apparent thickening of the wall of the esophagus throughout the distal 2/3 of the esophagus. Lungs/Pleura: There is postoperative change on the right with scarring throughout the right upper lobe region. On axial slice 35 series 8, there is an 8 x 6 mm slightly irregular nodular opacity in the posterior segment of the right upper lobe. There is patchy atelectasis in the lung bases bilaterally as well as in the right middle lobe region. No edema or consolidation is evident. No pleural effusion or pleural thickening evident. Upper Abdomen: In the visualized upper abdomen, the left adrenal shows hypertrophy. There is a masslike area in the anterior spleen measuring 7.1 x 6.0 cm. There is peripheral calcification in this area of the anterior spleen. Musculoskeletal: There is degenerative change in the thoracic spine. There are no evident blastic or lytic bone lesions. Review of the MIP images confirms the above findings. IMPRESSION: No demonstrable pulmonary embolus. Postoperative change right upper lobe region. Scarring in this area noted. In the posterior right upper lobe region, there is an 8 x 6 mm slightly irregular nodular opacity. Similar nodular opacities are not seen elsewhere. Non-contrast chest CT at 6-12 months is recommended. If the nodule is stable at time of repeat CT, then future CT at 18-24 months (from  today's scan) is considered optional for low-risk patients, but is recommended for high-risk patients. This recommendation follows the consensus statement: Guidelines for Management of Incidental Pulmonary Nodules Detected on CT Images: From the Fleischner Society 2017; Radiology 2017; 284:228-243. Given this patient has had a previous history of lung carcinoma, she is considered high risk. Several mildly prominent lymph nodes are noted in the mediastinum and hilar regions. Etiology uncertain. Given history of lung carcinoma and the nodular lesion in the right upper lobe an these prominent lymph nodes, correlation with nuclear medicine PET study may be advisable. Aortic atherosclerosis as well as foci of coronary artery calcification. Mass lesion in spleen with peripheral calcification. Suspect old splenic hematoma as most likely etiology. Particular attention this area on subsequent examinations would be warranted. Wall thickening in the esophagus with small hiatal hernia. Suspect chronic reflux esophagitis. Electronically Signed   By: Lowella Grip III M.D.   On: 12/29/2016 13:07    Procedures Procedures (including critical care time)  Medications Ordered in ED Medications  iopamidol (ISOVUE-370) 76 % injection 100 mL (100 mLs Intravenous Contrast Given 12/29/16 1220)  doxycycline (VIBRA-TABS) tablet 100 mg (100 mg Oral Given 12/29/16 1503)  albuterol (PROVENTIL HFA;VENTOLIN HFA) 108 (90 Base) MCG/ACT inhaler 2 puff (2 puffs Inhalation Given 12/29/16 1605)  ondansetron (ZOFRAN) injection 4 mg (4 mg Intravenous Given 12/29/16 1541)     Initial Impression / Assessment and Plan / ED Course  I have reviewed the triage vital signs and the nursing notes.  Pertinent labs & imaging results that were available during my care of the patient were reviewed by me and considered in my medical decision making (see chart for details).     Will treat for PNA/sinusitis. Also will give inhaler for cough.  Daughter will take for pcp follow up. otherwise stable for discharge.   Final Clinical Impressions(s) / ED Diagnoses   Final diagnoses:  Bronchitis    New Prescriptions Discharge Medication List as of 12/29/2016  3:15 PM    START taking these medications  Details  doxycycline (VIBRAMYCIN) 100 MG capsule Take 1 capsule (100 mg total) by mouth 2 (two) times daily. One po bid x 7 days, Starting Sun 12/29/2016, Print      I personally performed the services described in this documentation, which was scribed in my presence. The recorded information has been reviewed and is accurate.       Merrily Pew, MD 12/30/16 1000

## 2016-12-29 NOTE — ED Notes (Signed)
Pt RA sats 95-97% with ambulation, pt ambulated with slight leaning to right

## 2016-12-29 NOTE — ED Notes (Signed)
Pt ambulated to bathroom and back without difficultly 

## 2016-12-29 NOTE — ED Notes (Signed)
Pt returned from CT °

## 2017-01-09 DIAGNOSIS — J302 Other seasonal allergic rhinitis: Secondary | ICD-10-CM | POA: Diagnosis not present

## 2017-01-09 DIAGNOSIS — Z09 Encounter for follow-up examination after completed treatment for conditions other than malignant neoplasm: Secondary | ICD-10-CM | POA: Diagnosis not present

## 2017-01-09 DIAGNOSIS — J209 Acute bronchitis, unspecified: Secondary | ICD-10-CM | POA: Diagnosis not present

## 2017-01-09 DIAGNOSIS — Z6826 Body mass index (BMI) 26.0-26.9, adult: Secondary | ICD-10-CM | POA: Diagnosis not present

## 2017-01-09 DIAGNOSIS — J309 Allergic rhinitis, unspecified: Secondary | ICD-10-CM | POA: Diagnosis not present

## 2017-02-06 DIAGNOSIS — M47817 Spondylosis without myelopathy or radiculopathy, lumbosacral region: Secondary | ICD-10-CM | POA: Diagnosis not present

## 2017-02-06 DIAGNOSIS — M5417 Radiculopathy, lumbosacral region: Secondary | ICD-10-CM | POA: Diagnosis not present

## 2017-03-11 DIAGNOSIS — M5417 Radiculopathy, lumbosacral region: Secondary | ICD-10-CM | POA: Diagnosis not present

## 2017-03-11 DIAGNOSIS — M47817 Spondylosis without myelopathy or radiculopathy, lumbosacral region: Secondary | ICD-10-CM | POA: Diagnosis not present

## 2017-03-12 DIAGNOSIS — H903 Sensorineural hearing loss, bilateral: Secondary | ICD-10-CM | POA: Diagnosis not present

## 2017-03-28 ENCOUNTER — Encounter: Payer: Self-pay | Admitting: Gastroenterology

## 2017-03-28 ENCOUNTER — Ambulatory Visit (INDEPENDENT_AMBULATORY_CARE_PROVIDER_SITE_OTHER): Payer: Medicare HMO | Admitting: Gastroenterology

## 2017-03-28 VITALS — BP 102/52 | HR 66 | Temp 97.8°F | Ht 68.0 in | Wt 143.6 lb

## 2017-03-28 DIAGNOSIS — K589 Irritable bowel syndrome without diarrhea: Secondary | ICD-10-CM | POA: Diagnosis not present

## 2017-03-28 DIAGNOSIS — K219 Gastro-esophageal reflux disease without esophagitis: Secondary | ICD-10-CM

## 2017-03-28 NOTE — Assessment & Plan Note (Signed)
Takes Bentyl every other day. Several small but incomplete bowel movements daily but no frank diarrhea. Stool texture has changed possibly related to change in medication although on exam today she confirmed stool appearance similar to what she's been seeing but nothing alarming noted. We'll have her come back to see Dr. Oneida Alar, and 6-8 weeks. By that point she will likely be back on omeprazole 20 mg twice a day and hopefully back to her baseline. I'll in the interim with any questions or concerns.

## 2017-03-28 NOTE — Progress Notes (Signed)
cc'ed to pcp °

## 2017-03-28 NOTE — Assessment & Plan Note (Signed)
Previously better controlled on omeprazole 20 mg twice a day but insurance would only pay once daily per patient. She was then switched to a 40 mg daily but continue to have nocturnal symptoms. Subsequently on pantoprazole 40 mg daily but again does not get adequate 24-hour coverage. It is not clear how much of these changes has affected her stool texture which she is significantly concerned about. Tried provide reassurance with her today. No evidence of blood, heme negative. Obtain records from pharmacy and work towards trying to get omeprazole 20 mg twice a day approved.

## 2017-03-28 NOTE — Patient Instructions (Signed)
1. We will start process to try and get omeprazole covered for twice daily. We will contact you with further information as available.  2. Return to the office in 6 weeks.

## 2017-03-28 NOTE — Progress Notes (Signed)
Primary Care Physician: Celene Squibb, MD  Primary Gastroenterologist:  Barney Drain, MD   Chief Complaint  Patient presents with  . dark stool    HPI: Whitney Galloway is a 83 y.o. female here for follow-up. She was last seen in June 2016. She has h/o GERD, IBS, gastroparesis. Last EGD in November 2012, hiatal hernia and mild gastritis. Incomplete colonoscopy in 2006 by Dr. Melony Overly limited to the splenic flexure, extensive diverticulosis of the sigmoid colon. She had had a barium and in 2005 with diverticulosis and tortuosity but otherwise unremarkable. In 2011 she was advised to go to [facility] to try to complete a colonoscopy but she refused and canceled the procedure in October 2011. She did reschedule in 2012 but then canceled again.  Since last fall, she has been having light brown grainy stool most BMs. She initially described as coffee grounds but upon further questioning she states the stool was not black, looks like ground-up nuts. Unfortunately she had mentioned coffee grounds to a church member who is a nurse who then told the patient she could be having GI bleeding. According to the daughter this seemed to escalate the patient's concerns. Notably her hemoglobin was normal back in March. Provided reassurance that if she were having true melena for 6 months she would be anemic. Upon further questioning patient places tissue at the rectum and uses a pad in her undergarments. When she goes to the restroom she has specks the tissue and it is that that time she notes grainy appearance to her stool. Very difficult to obtain history from the patient. She is somewhat difficult historian. Daughter states she has not seen the stool. Patient's eyesight is poor and she cannot evaluate the stool in the toilet. No noted fresh blood. She read somewhere on the Internet that a medication may be causing her stool be grainy and she's quite concerned that she cannot recall the  name of the medication. She herself is had some medication changes, had been on omeprazole 20 mg twice daily doing very well with regards to her reflux but her insurance would not cover but once daily. Unfortunately she did not let us know that so we have not tried any type of prior authorizations. After several months of once daily omeprazole she was increased to 40 mg daily which still did not control her nighttime symptoms. She ultimately was switched to pantoprazole 40 mg daily but has to wait and take this before lunch in order to control her nighttime symptoms. Timeline is unclear as far as when these changes occurred and if they correlate with her change in stool.  She denies abdominal pain. She notes some fecal soilage. She doesn't want to go anywhere because she's concerned she may make a mess. She goes to have a BM several times daily but notes is a very small amount each time. Denies loose stools. Occasionally hard. Takes her Bentyl every other day.No weight loss.     Current Outpatient Prescriptions  Medication Sig Dispense Refill  . calcium carbonate (TUMS - DOSED IN MG ELEMENTAL CALCIUM) 500 MG chewable tablet Chew 1 tablet by mouth as needed. gas    . dicyclomine (BENTYL) 10 MG capsule 1 po every other night followed by another dose in the morning. YOU MAY USE ONE AT 12 NOON IF NEEDED. 90 capsule 11  . fexofenadine (ALLEGRA) 180 MG tablet Take 180 mg by mouth daily.    [name] LORazepam (ATIVAN)  1 MG tablet Take 1 mg by mouth at bedtime.     . Multiple Vitamin (MULTIVITAMIN WITH MINERALS) TABS tablet Take 1 tablet by mouth daily.    . multivitamin-lutein (OCUVITE-LUTEIN) CAPS Take 1 capsule by mouth daily.     . mupirocin ointment (BACTROBAN) 2 % Apply 1 application topically daily as needed.    . naproxen sodium (ALEVE) 220 MG tablet Take 220 mg by mouth 2 (two) times daily as needed (Pain).    . pantoprazole (PROTONIX) 40 MG tablet Take 40 mg by mouth daily.     Vladimir Faster Glycol-Propyl  Glycol (SYSTANE OP) Place 1 drop into both eyes daily.    . TOPROL XL 25 MG 24 hr tablet Take 25 mg by mouth daily.    [name] venlafaxine XR (EFFEXOR-XR) 150 MG 24 hr capsule Take 150 mg by mouth daily.     No current facility-administered medications for this visit.     Allergies as of 03/28/2017  . (No Known Allergies)    ROS:  General: Negative for anorexia, weight loss, fever, chills, fatigue, weakness. Hard of hearing but she reads lips too. ENT: Negative for hoarseness, difficulty swallowing , nasal congestion. CV: Negative for chest pain, angina, palpitations, dyspnea on exertion, peripheral edema.  Respiratory: Negative for dyspnea at rest, dyspnea on exertion, cough, sputum, wheezing.  GI: See history of present illness. GU:  Negative for dysuria, hematuria, urinary incontinence, urinary frequency, nocturnal urination.  Endo: Negative for unusual weight change.    Physical Examination:   BP (!) 102/52   Pulse 66   Temp 97.8 F (36.6 C) (Oral)   Ht 5\' 8"  (1.727 m)   Wt 143 lb 9.6 oz (65.1 kg)   BMI 21.83 kg/m   General: Well-nourished, well-developed in no acute distress.  Eyes: No icterus. Mouth: Oropharyngeal mucosa moist and pink , no lesions erythema or exudate. Lungs: Clear to auscultation bilaterally.  Heart: Regular rate and rhythm, no murmurs rubs or gallops.  Abdomen: Bowel sounds are normal, nontender, nondistended, no hepatosplenomegaly or masses, no abdominal bruits or hernia , no rebound or guarding.   Rectal exam: Loose sphincter tone. No stool on tissue which is at the rectum. Internal exam reveals light brown very soft stool. Patient notes grainy appearance similar to what she's been seen. Looked fairly normal to me. Extremities: No lower extremity edema. No clubbing or deformities. Neuro: Alert and oriented x 4   Skin: Warm and dry, no jaundice.   Psych: Alert and cooperative, normal mood and affect.  Labs:  Lab Results  Component Value Date   WBC  8.8 12/29/2016   HGB 12.6 12/29/2016   HCT 37.6 12/29/2016   MCV 86.4 12/29/2016   PLT 215 12/29/2016   Lab Results  Component Value Date   CREATININE 0.68 12/29/2016   BUN 10 12/29/2016   NA 135 12/29/2016   K 3.5 12/29/2016   CL 102 12/29/2016   CO2 24 12/29/2016   Lab Results  Component Value Date   ALT 19 12/29/2016   AST 27 12/29/2016   ALKPHOS 80 12/29/2016   BILITOT 0.7 12/29/2016    Imaging Studies: No results found.

## 2017-04-09 MED ORDER — OMEPRAZOLE 20 MG PO CPDR: 20.0000 mg | DELAYED_RELEASE_CAPSULE | Freq: Two times a day (BID) | ORAL | 5 refills | Status: DC

## 2017-04-09 NOTE — Progress Notes (Addendum)
Reviewed pharmacy records. She switched from omeprazole 20mg  bid to 40 mg daily in 11/2016. Switched to pantoprazole 40mg  daily in 12/2016.   I will send in omeprazole 20mg  bid to see if we can get approved by insurance as omeprazole 40mg  daily and pantoprazole 40mg  daily has not controlled her gerd.   Doris/Julie please f/u on process of getting omeprazole approved.

## 2017-04-09 NOTE — Addendum Note (Signed)
Addended by: Mahala Menghini on: 04/09/2017 07:16 PM   Modules accepted: Orders

## 2017-04-10 ENCOUNTER — Telehealth: Payer: Self-pay

## 2017-04-10 NOTE — Progress Notes (Signed)
I have tried to do a PA with [employer]. This is what insurance information we have. They sent me a fax on 04/08/17 stating the pt does not have Rx coverage with them. Spoke with the daughter, Whitney Galloway,  she is not sure what coverage the pt has. Informed her that [facility], [facility]. I have called in Rx to [facility] and asked them to send me her Rx insurance information to work on [place].

## 2017-04-10 NOTE — Telephone Encounter (Signed)
PA was done for omeprazole 20mg  bid and it has been approved. Faxed approval to the pharmacy. I tried to call the pts daughter, NA- LMOM that rx was approved.

## 2017-04-10 NOTE — Telephone Encounter (Signed)
Noted  

## 2017-04-10 NOTE — Progress Notes (Signed)
Left Vm for pt's daughter, Kelli Churn @ [phone] to return call. Almyra Free needs prescription coverage info for the PA.

## 2017-04-13 NOTE — Progress Notes (Signed)
REVIEWED-NO ADDITIONAL RECOMMENDATIONS. 

## 2017-04-16 DIAGNOSIS — F339 Major depressive disorder, recurrent, unspecified: Secondary | ICD-10-CM | POA: Diagnosis not present

## 2017-04-16 DIAGNOSIS — Z6825 Body mass index (BMI) 25.0-25.9, adult: Secondary | ICD-10-CM | POA: Diagnosis not present

## 2017-04-16 DIAGNOSIS — R69 Illness, unspecified: Secondary | ICD-10-CM | POA: Diagnosis not present

## 2017-04-16 DIAGNOSIS — F419 Anxiety disorder, unspecified: Secondary | ICD-10-CM | POA: Diagnosis not present

## 2017-05-19 DIAGNOSIS — M5417 Radiculopathy, lumbosacral region: Secondary | ICD-10-CM | POA: Diagnosis not present

## 2017-06-05 ENCOUNTER — Ambulatory Visit (INDEPENDENT_AMBULATORY_CARE_PROVIDER_SITE_OTHER): Payer: Medicare HMO | Admitting: Gastroenterology

## 2017-06-05 DIAGNOSIS — K219 Gastro-esophageal reflux disease without esophagitis: Secondary | ICD-10-CM

## 2017-06-05 DIAGNOSIS — K3184 Gastroparesis: Secondary | ICD-10-CM

## 2017-06-05 DIAGNOSIS — K581 Irritable bowel syndrome with constipation: Secondary | ICD-10-CM

## 2017-06-05 MED ORDER — LIDOCAINE VISCOUS 2 % MT SOLN: OROMUCOSAL | 1 refills | Status: DC

## 2017-06-05 NOTE — Assessment & Plan Note (Signed)
SYMPTOMS FAIRLY WELL CONTROLLED.  CONTINUE TO MONITOR SYMPTOMS. 

## 2017-06-05 NOTE — Progress Notes (Signed)
CC'ED TO PCP 

## 2017-06-05 NOTE — Patient Instructions (Addendum)
To avoid constipation, TAKE MIRALAX EVERY MON, WED, FRI. IF A WHOLE CAP FULL MAKES YOU HAVE LOOSE STOOLS THEN TAKE HALF A CAP FULL.  FOLLOW UP IN 6 MOS.

## 2017-06-05 NOTE — Progress Notes (Signed)
Subjective:    Patient ID: Whitney Galloway, female    DOB: 09-29-1929, 83 y.o.   MRN: [id number]  HPI Up in [employer] for 2 weeks. Bowels would move but had lots of cramps and maybe 1-2x/day. Passing PELLET LIKE. PAIN IS ALL GONE NOW. FEW WEEKS AGO BOWELS DIDN'T MOVE FOR 5 DAYS. TOOK MIRALAX AND FINALLY ON 5TH DAY HER BOWELS MOVED. MIRALAX MAY MAKE HER HAVE LOOSE STOOL/ACCIDENTS. LOTS OF NAUSEA: AT LEAST 2X/WEEK.BOWELS MAY MOVE #1-#3 TO #5-#7, NOT #4 SO MUCH. HEARTBURN BAD FOR A WHILE AND THEN IT QUIT. DRINKS A LOT OF COFFEE(1-2 POTS A DAY). DRINKS GINGER ALE IF SHE HAS SYMPTOMS. OCCASIONAL CHEST PAIN/SOB BUT NOT AS BAD AS IT USED TO BE. EATING MINTS.   PT DENIES FEVER, CHILLS, HEMATOCHEZIA,  vomiting, melena, CHEST PAIN, SHORTNESS OF BREATH,  CHANGE IN BOWEL IN HABITS, OR problems swallowing.  Past Medical History:  Diagnosis Date  . Bowel incontinence   . Colon polyp 1994 Malignant polyp   2005 NL BE; 2006 incomplete TCS DUE TO REDUNDNACY  . Diverticulosis   . Fibromyalgia   . Gastritis   . Gastroparesis 2009   65% RETENTION AT 2 HOURS  . GERD (gastroesophageal reflux disease)   . Hard of hearing   . Hernia of unspecified site of abdominal cavity without mention of obstruction or gangrene   . Hiatal hernia   . HIATAL HERNIA 06/23/2008   Qualifier: Diagnosis of  By: Craige Cotta    . HTN (hypertension)   . IBS (irritable bowel syndrome)   . Osteoporosis   . S/P endoscopy 2007, 2009   2007: single distal esophageal erosion, s/p 29 F dilation, no web/ring/stricture noted; 2009: normal, Bravo with adequate suppression on Prevacid BID  . Splenic lesion HAMARTOMA  . TB (tuberculosis) AGE 42  . Wears dentures    top  . Wears glasses   . Wears hearing aid    Past Surgical History:  Procedure Laterality Date  . BACK SURGERY  2014   lumbar lam  . Benign tumor removed from bilateral breast    . cataract surgery    . COCHLEAR IMPLANT Left 09/21/2014   Procedure: COCHLEAR IMPLANT LEFT  EAR;  Surgeon: Fannie Knee, MD;  Location: [place];  Service: ENT;  Laterality: Left;  . COLONOSCOPY W/ POLYPECTOMY    . ESOPHAGOGASTRODUODENOSCOPY  08/27/2011   hiatal hernia/mild gastritis  . ESOPHAGOGASTRODUODENOSCOPY  05/25/2008    Normal esophagus without evidence of Barrett mass/  Normal stomach, duodenal bulb, and second portion of the duodenum  . ESOPHAGOGASTRODUODENOSCOPY  08/11/2006   Single erosion at distal esophagus and a small sliding hiatal hernia/ No evidence of ring or stricture but esophagus was dilated by passing 39 [place] Maloney dilator/ Prepyloric antral erythema but no evidence of erosions or peptic ulcer disease.  . ESOPHAGOGASTRODUODENOSCOPY  09/03/2005   Small sliding hiatal hernia without evidence of ring or  stricture formation. Esophagus dilated by passing 54-French Maloney dilator   given history of dysphagia  . GIVENS CAPSULE STUDY  05/27/2008   Adequate acid suppression with proton pump inhibitor.  [name] LEFT WRIST CYST REMOVAL    . LUNG SURGERY for pulmonary nodules    . Mass removed from roof of mouth    . SAVORY DILATION  08/27/2011   stricture in the distal esophagus  . UPPER GASTROINTESTINAL ENDOSCOPY  2006  . X-STOP IMPLANTATION      No Known Allergies  Current Outpatient Prescriptions  Medication Sig  Dispense Refill  . calcium carbonate (TUMS - DOSED IN MG ELEMENTAL CALCIUM) 500 MG chewable tablet Chew 1 tablet by mouth as needed. gas    . dicyclomine (BENTYL) 10 MG capsule 1 po every other night followed by another dose in the morning. YOU MAY USE ONE AT 12 NOON IF NEEDED. (Patient taking differently: 1 po every other night followed by another dose in the morning. YOU MAY USE ONE AT 12 NOON IF NEEDED. Takes every morning then every evening (every other day))    . fexofenadine (ALLEGRA) 180 MG tablet Take 180 mg by mouth daily.    [name] LORazepam (ATIVAN) 1 MG tablet Take 0.5 mg by mouth at bedtime.     . Multiple Vitamin (MULTIVITAMIN  WITH MINERALS) TABS tablet Take 1 tablet by mouth daily.    . Multiple Vitamins-Minerals (PRESERVISION AREDS PO) Take by mouth daily.    . mupirocin ointment (BACTROBAN) 2 % Apply 1 application topically daily as needed.    . naproxen sodium (ALEVE) 220 MG tablet Take 220 mg by mouth 2 (two) times daily as needed (Pain).    [name] omeprazole (PRILOSEC) 20 MG capsule Take 1 capsule (20 mg total) by mouth 2 (two) times daily before a meal. (Patient taking differently: Take 20 mg by mouth daily. )    . Polyethyl Glycol-Propyl Glycol (SYSTANE OP) Place 1 drop into both eyes daily.    . Probiotic Product (PROBIOTIC DAILY PO) Take by mouth every other day.    . TOPROL XL 25 MG 24 hr tablet Take 25 mg by mouth 2 (two) times daily.     . multivitamin-lutein (OCUVITE-LUTEIN) CAPS Take 1 capsule by mouth daily.     [name] venlafaxine XR (EFFEXOR-XR) 150 MG 24 hr capsule Take 150 mg by mouth daily.     Review of Systems PER HPI OTHERWISE ALL SYSTEMS ARE NEGATIVE.    Objective:   Physical Exam  Constitutional: She is oriented to person, place, and time. She appears well-developed and well-nourished. No distress.  HENT:  Head: Normocephalic and atraumatic.  Mouth/Throat: Oropharynx is clear and moist. No oropharyngeal exudate.  Eyes: Pupils are equal, round, and reactive to light. No scleral icterus.  Neck: Normal range of motion. Neck supple.  Cardiovascular: Normal rate, regular rhythm and normal heart sounds.   Pulmonary/Chest: Effort normal and breath sounds normal. No respiratory distress.  Abdominal: Soft. Bowel sounds are normal. She exhibits no distension. There is no tenderness.  Musculoskeletal: She exhibits no edema.  Lymphadenopathy:    She has no cervical adenopathy.  Neurological: She is alert and oriented to person, place, and time.  HARD OF HEARING. Hearing aids in place. NO  NEW FOCAL DEFICITS  Psychiatric:  SLIGHTLY ANXIOUS MOOD, NL AFFECT  Vitals reviewed.     Assessment & Plan:

## 2017-06-05 NOTE — Assessment & Plan Note (Signed)
SYMPTOMS FAIRLY WELL CONTROLLED. IN SPITE OF CAFFEINE, AND CARBONATED BEVERAGES.  PT UNWILLING TO STOP EITHER. USE VISCOUS LIDOCAINE IF NEEDED FOR HEARTBURN RX SENT. FOLLOW UP IN 6 MOS.

## 2017-06-05 NOTE — Assessment & Plan Note (Signed)
SYMPTOMS NOT IDEALLY CONTROLLED DUE TO ABDOMINAL PAIN AND CONSTIPATION AND LOOSE STOOLS/INCONTINENCE WITH DAILY MIRALAX.Whitney Galloway   TITRATE MIRALAX. To avoid constipation, TAKE MIRALAX EVERY MON, WED, FRI. IF A WHOLE CAP FULL MAKES YOU HAVE LOOSE STOOLS THEN TAKE HALF A CAP FULL.  FOLLOW UP IN 6 MOS.

## 2017-06-06 NOTE — Progress Notes (Signed)
ON RECALL  °

## 2017-06-16 ENCOUNTER — Telehealth: Payer: Self-pay | Admitting: Gastroenterology

## 2017-06-16 NOTE — Telephone Encounter (Signed)
Tried to return call and the call would not go through.

## 2017-06-16 NOTE — Telephone Encounter (Signed)
Whitney Galloway, PAM, [phone] ABOUT PATIENTS PRESCRIPTION, HAS QUESTIONS

## 2017-06-17 NOTE — Telephone Encounter (Signed)
LMOM to call.

## 2017-06-18 ENCOUNTER — Telehealth: Payer: Self-pay | Admitting: Gastroenterology

## 2017-06-18 NOTE — Telephone Encounter (Signed)
Tried to call and phone busy.

## 2017-06-18 NOTE — Telephone Encounter (Signed)
I went over the directions for the Vicous Lidocaine with Pam, pt's daughter. They were just not aware that Dr. Oneida Alar was sending the prescription in. But I told her it was on the AVS.  She expressed understanding.

## 2017-06-18 NOTE — Telephone Encounter (Signed)
[phone] patient daughter Jeannene Patella needs to speak to you regarding her mothers medications, please call

## 2017-06-19 NOTE — Telephone Encounter (Signed)
REVIEWED-NO ADDITIONAL RECOMMENDATIONS. 

## 2017-07-02 DIAGNOSIS — M47817 Spondylosis without myelopathy or radiculopathy, lumbosacral region: Secondary | ICD-10-CM | POA: Diagnosis not present

## 2017-07-02 DIAGNOSIS — M5417 Radiculopathy, lumbosacral region: Secondary | ICD-10-CM | POA: Diagnosis not present

## 2017-07-02 DIAGNOSIS — I10 Essential (primary) hypertension: Secondary | ICD-10-CM | POA: Diagnosis not present

## 2017-07-08 DIAGNOSIS — E782 Mixed hyperlipidemia: Secondary | ICD-10-CM | POA: Diagnosis not present

## 2017-07-08 DIAGNOSIS — R7301 Impaired fasting glucose: Secondary | ICD-10-CM | POA: Diagnosis not present

## 2017-07-08 DIAGNOSIS — D509 Iron deficiency anemia, unspecified: Secondary | ICD-10-CM | POA: Diagnosis not present

## 2017-07-08 DIAGNOSIS — I10 Essential (primary) hypertension: Secondary | ICD-10-CM | POA: Diagnosis not present

## 2017-07-10 DIAGNOSIS — E782 Mixed hyperlipidemia: Secondary | ICD-10-CM | POA: Diagnosis not present

## 2017-07-10 DIAGNOSIS — E8809 Other disorders of plasma-protein metabolism, not elsewhere classified: Secondary | ICD-10-CM | POA: Diagnosis not present

## 2017-07-10 DIAGNOSIS — R079 Chest pain, unspecified: Secondary | ICD-10-CM | POA: Diagnosis not present

## 2017-07-10 DIAGNOSIS — L989 Disorder of the skin and subcutaneous tissue, unspecified: Secondary | ICD-10-CM | POA: Diagnosis not present

## 2017-07-10 DIAGNOSIS — T7840XA Allergy, unspecified, initial encounter: Secondary | ICD-10-CM | POA: Diagnosis not present

## 2017-07-10 DIAGNOSIS — K581 Irritable bowel syndrome with constipation: Secondary | ICD-10-CM | POA: Diagnosis not present

## 2017-07-10 DIAGNOSIS — R69 Illness, unspecified: Secondary | ICD-10-CM | POA: Diagnosis not present

## 2017-07-10 DIAGNOSIS — R0600 Dyspnea, unspecified: Secondary | ICD-10-CM | POA: Diagnosis not present

## 2017-07-10 DIAGNOSIS — Z23 Encounter for immunization: Secondary | ICD-10-CM | POA: Diagnosis not present

## 2017-07-10 DIAGNOSIS — R5383 Other fatigue: Secondary | ICD-10-CM | POA: Diagnosis not present

## 2017-07-10 DIAGNOSIS — I10 Essential (primary) hypertension: Secondary | ICD-10-CM | POA: Diagnosis not present

## 2017-08-19 DIAGNOSIS — M5417 Radiculopathy, lumbosacral region: Secondary | ICD-10-CM | POA: Diagnosis not present

## 2017-09-22 DIAGNOSIS — H5200 Hypermetropia, unspecified eye: Secondary | ICD-10-CM | POA: Diagnosis not present

## 2017-10-09 ENCOUNTER — Encounter: Payer: Self-pay | Admitting: Gastroenterology

## 2017-10-15 DIAGNOSIS — M5417 Radiculopathy, lumbosacral region: Secondary | ICD-10-CM | POA: Diagnosis not present

## 2017-10-15 DIAGNOSIS — M47817 Spondylosis without myelopathy or radiculopathy, lumbosacral region: Secondary | ICD-10-CM | POA: Diagnosis not present

## 2017-10-15 DIAGNOSIS — I10 Essential (primary) hypertension: Secondary | ICD-10-CM | POA: Diagnosis not present

## 2017-10-17 DIAGNOSIS — M5417 Radiculopathy, lumbosacral region: Secondary | ICD-10-CM | POA: Diagnosis not present

## 2017-10-17 DIAGNOSIS — G4700 Insomnia, unspecified: Secondary | ICD-10-CM | POA: Diagnosis not present

## 2017-10-17 DIAGNOSIS — R69 Illness, unspecified: Secondary | ICD-10-CM | POA: Diagnosis not present

## 2017-10-17 DIAGNOSIS — M47817 Spondylosis without myelopathy or radiculopathy, lumbosacral region: Secondary | ICD-10-CM | POA: Diagnosis not present

## 2017-10-17 DIAGNOSIS — Z9181 History of falling: Secondary | ICD-10-CM | POA: Diagnosis not present

## 2017-10-17 DIAGNOSIS — I10 Essential (primary) hypertension: Secondary | ICD-10-CM | POA: Diagnosis not present

## 2017-10-22 DIAGNOSIS — R69 Illness, unspecified: Secondary | ICD-10-CM | POA: Diagnosis not present

## 2017-10-22 DIAGNOSIS — M47817 Spondylosis without myelopathy or radiculopathy, lumbosacral region: Secondary | ICD-10-CM | POA: Diagnosis not present

## 2017-10-22 DIAGNOSIS — I10 Essential (primary) hypertension: Secondary | ICD-10-CM | POA: Diagnosis not present

## 2017-10-22 DIAGNOSIS — M5417 Radiculopathy, lumbosacral region: Secondary | ICD-10-CM | POA: Diagnosis not present

## 2017-10-22 DIAGNOSIS — Z9181 History of falling: Secondary | ICD-10-CM | POA: Diagnosis not present

## 2017-10-22 DIAGNOSIS — G4700 Insomnia, unspecified: Secondary | ICD-10-CM | POA: Diagnosis not present

## 2017-10-24 DIAGNOSIS — Z9181 History of falling: Secondary | ICD-10-CM | POA: Diagnosis not present

## 2017-10-24 DIAGNOSIS — M5417 Radiculopathy, lumbosacral region: Secondary | ICD-10-CM | POA: Diagnosis not present

## 2017-10-24 DIAGNOSIS — G4700 Insomnia, unspecified: Secondary | ICD-10-CM | POA: Diagnosis not present

## 2017-10-24 DIAGNOSIS — R69 Illness, unspecified: Secondary | ICD-10-CM | POA: Diagnosis not present

## 2017-10-24 DIAGNOSIS — I10 Essential (primary) hypertension: Secondary | ICD-10-CM | POA: Diagnosis not present

## 2017-10-24 DIAGNOSIS — M47817 Spondylosis without myelopathy or radiculopathy, lumbosacral region: Secondary | ICD-10-CM | POA: Diagnosis not present

## 2017-10-28 DIAGNOSIS — I10 Essential (primary) hypertension: Secondary | ICD-10-CM | POA: Diagnosis not present

## 2017-10-28 DIAGNOSIS — M5417 Radiculopathy, lumbosacral region: Secondary | ICD-10-CM | POA: Diagnosis not present

## 2017-10-28 DIAGNOSIS — M47817 Spondylosis without myelopathy or radiculopathy, lumbosacral region: Secondary | ICD-10-CM | POA: Diagnosis not present

## 2017-10-28 DIAGNOSIS — R69 Illness, unspecified: Secondary | ICD-10-CM | POA: Diagnosis not present

## 2017-10-28 DIAGNOSIS — G4700 Insomnia, unspecified: Secondary | ICD-10-CM | POA: Diagnosis not present

## 2017-10-28 DIAGNOSIS — Z9181 History of falling: Secondary | ICD-10-CM | POA: Diagnosis not present

## 2017-10-30 DIAGNOSIS — I10 Essential (primary) hypertension: Secondary | ICD-10-CM | POA: Diagnosis not present

## 2017-10-30 DIAGNOSIS — G4700 Insomnia, unspecified: Secondary | ICD-10-CM | POA: Diagnosis not present

## 2017-10-30 DIAGNOSIS — M47817 Spondylosis without myelopathy or radiculopathy, lumbosacral region: Secondary | ICD-10-CM | POA: Diagnosis not present

## 2017-10-30 DIAGNOSIS — Z9181 History of falling: Secondary | ICD-10-CM | POA: Diagnosis not present

## 2017-10-30 DIAGNOSIS — M5417 Radiculopathy, lumbosacral region: Secondary | ICD-10-CM | POA: Diagnosis not present

## 2017-10-30 DIAGNOSIS — R69 Illness, unspecified: Secondary | ICD-10-CM | POA: Diagnosis not present

## 2017-11-05 DIAGNOSIS — M5417 Radiculopathy, lumbosacral region: Secondary | ICD-10-CM | POA: Diagnosis not present

## 2017-11-05 DIAGNOSIS — G4700 Insomnia, unspecified: Secondary | ICD-10-CM | POA: Diagnosis not present

## 2017-11-05 DIAGNOSIS — M47817 Spondylosis without myelopathy or radiculopathy, lumbosacral region: Secondary | ICD-10-CM | POA: Diagnosis not present

## 2017-11-05 DIAGNOSIS — R69 Illness, unspecified: Secondary | ICD-10-CM | POA: Diagnosis not present

## 2017-11-05 DIAGNOSIS — Z9181 History of falling: Secondary | ICD-10-CM | POA: Diagnosis not present

## 2017-11-05 DIAGNOSIS — I10 Essential (primary) hypertension: Secondary | ICD-10-CM | POA: Diagnosis not present

## 2017-11-07 DIAGNOSIS — M47817 Spondylosis without myelopathy or radiculopathy, lumbosacral region: Secondary | ICD-10-CM | POA: Diagnosis not present

## 2017-11-07 DIAGNOSIS — G4700 Insomnia, unspecified: Secondary | ICD-10-CM | POA: Diagnosis not present

## 2017-11-07 DIAGNOSIS — Z9181 History of falling: Secondary | ICD-10-CM | POA: Diagnosis not present

## 2017-11-07 DIAGNOSIS — M5417 Radiculopathy, lumbosacral region: Secondary | ICD-10-CM | POA: Diagnosis not present

## 2017-11-07 DIAGNOSIS — R69 Illness, unspecified: Secondary | ICD-10-CM | POA: Diagnosis not present

## 2017-11-07 DIAGNOSIS — I10 Essential (primary) hypertension: Secondary | ICD-10-CM | POA: Diagnosis not present

## 2017-11-11 DIAGNOSIS — Z9181 History of falling: Secondary | ICD-10-CM | POA: Diagnosis not present

## 2017-11-11 DIAGNOSIS — M47817 Spondylosis without myelopathy or radiculopathy, lumbosacral region: Secondary | ICD-10-CM | POA: Diagnosis not present

## 2017-11-11 DIAGNOSIS — R69 Illness, unspecified: Secondary | ICD-10-CM | POA: Diagnosis not present

## 2017-11-11 DIAGNOSIS — I10 Essential (primary) hypertension: Secondary | ICD-10-CM | POA: Diagnosis not present

## 2017-11-11 DIAGNOSIS — G4700 Insomnia, unspecified: Secondary | ICD-10-CM | POA: Diagnosis not present

## 2017-11-11 DIAGNOSIS — M5417 Radiculopathy, lumbosacral region: Secondary | ICD-10-CM | POA: Diagnosis not present

## 2017-11-12 DIAGNOSIS — H903 Sensorineural hearing loss, bilateral: Secondary | ICD-10-CM | POA: Diagnosis not present

## 2017-11-15 DIAGNOSIS — I10 Essential (primary) hypertension: Secondary | ICD-10-CM | POA: Diagnosis not present

## 2017-11-15 DIAGNOSIS — M5417 Radiculopathy, lumbosacral region: Secondary | ICD-10-CM | POA: Diagnosis not present

## 2017-11-15 DIAGNOSIS — G4700 Insomnia, unspecified: Secondary | ICD-10-CM | POA: Diagnosis not present

## 2017-11-15 DIAGNOSIS — R69 Illness, unspecified: Secondary | ICD-10-CM | POA: Diagnosis not present

## 2017-11-15 DIAGNOSIS — M47817 Spondylosis without myelopathy or radiculopathy, lumbosacral region: Secondary | ICD-10-CM | POA: Diagnosis not present

## 2017-11-15 DIAGNOSIS — Z9181 History of falling: Secondary | ICD-10-CM | POA: Diagnosis not present

## 2017-11-20 ENCOUNTER — Ambulatory Visit: Payer: Medicare HMO | Admitting: Gastroenterology

## 2017-11-24 DIAGNOSIS — M5416 Radiculopathy, lumbar region: Secondary | ICD-10-CM | POA: Diagnosis not present

## 2017-12-08 DIAGNOSIS — H93299 Other abnormal auditory perceptions, unspecified ear: Secondary | ICD-10-CM | POA: Diagnosis not present

## 2017-12-08 DIAGNOSIS — H903 Sensorineural hearing loss, bilateral: Secondary | ICD-10-CM | POA: Diagnosis not present

## 2017-12-26 DIAGNOSIS — M5416 Radiculopathy, lumbar region: Secondary | ICD-10-CM | POA: Diagnosis not present

## 2017-12-26 DIAGNOSIS — M47817 Spondylosis without myelopathy or radiculopathy, lumbosacral region: Secondary | ICD-10-CM | POA: Diagnosis not present

## 2017-12-26 DIAGNOSIS — M5417 Radiculopathy, lumbosacral region: Secondary | ICD-10-CM | POA: Diagnosis not present

## 2018-01-07 ENCOUNTER — Encounter: Payer: Self-pay | Admitting: Gastroenterology

## 2018-01-07 ENCOUNTER — Ambulatory Visit: Payer: Medicare HMO | Admitting: Gastroenterology

## 2018-01-07 DIAGNOSIS — K219 Gastro-esophageal reflux disease without esophagitis: Secondary | ICD-10-CM

## 2018-01-07 DIAGNOSIS — K3184 Gastroparesis: Secondary | ICD-10-CM | POA: Diagnosis not present

## 2018-01-07 DIAGNOSIS — K581 Irritable bowel syndrome with constipation: Secondary | ICD-10-CM

## 2018-01-07 NOTE — Assessment & Plan Note (Signed)
SYMPTOMS FAIRLY WELL CONTROLLED.  CONTINUE OMEPRAZOLE.  TAKE 30 MINUTES PRIOR TO YOUR FISRT MEAL. CONTINUE TO MONITOR SYMPTOMS. FOLLOW UP IN 6 MOS.

## 2018-01-07 NOTE — Patient Instructions (Signed)
START TUREMIC PILLS DAILY TO REDUCE PAIN  ND INCREASE ENERGY.  FOR ENERGY TRY TWO TSP BRAGGS VINEGAR TWO OR THREE TIMES A DAY. TRY IT FOR 3 MOS AND IF YOU DON'T FEEL ANY DIFFERENT STOP USING IT.  USE GARDEN CHAIR OR BENCH FOR YARD WORK.  FOLLOW UP IN 6 MOS.

## 2018-01-07 NOTE — Assessment & Plan Note (Signed)
SYMPTOMS FAIRLY WELL CONTROLLED BY DIET.  CONTINUE TO MONITOR SYMPTOMS. 

## 2018-01-07 NOTE — Progress Notes (Signed)
ON RECALL  °

## 2018-01-07 NOTE — Assessment & Plan Note (Signed)
SYMPTOMS FAIRLY WELL CONTROLLED.  CONTINUE MIRALAX TO AVOID CONSTIPATION. START TUREMIC PILLS DAILY TO REDUCE PAIN  ND INCREASE ENERGY. FOR ENERGY TRY TWO TSP BRAGGS VINEGAR TWO OR THREE TIMES A DAY. TRY IT FOR 3 MOS AND IF YOU DON'T FEEL ANY DIFFERENT STOP USING IT. USE GARDEN CHAIR OR BENCH FOR YARD WORK. FOLLOW UP IN 6 MOS.

## 2018-01-07 NOTE — Progress Notes (Signed)
Subjective:    Patient ID: Whitney Galloway, female    DOB: 1929-01-02, 84 y.o.   MRN: [id number]  Celene Squibb, MD   HPI BEEN POOPING GOOD AND BAD. QUIT PROBIOTIC BECAUSE TAKING MIRALAX AND BOWELS HAVE BEEN MOVING BETTER. REFLUX THE SAME. ONCE IN A WHILE HAS AN ACCIDENT.S TILL WEARS A PAD. STAYS TIRED A LOT. TAKES 2 HRS TO GET STARTED. HAS BACK AND ARTHRITIS PROBLEM. RARELY USES BENTYL: 2-4 TIMES A WEEK. NAUSEA OFF AND ON. NEEDS SOMETHING TO HELP MEMORIES. GOING TO [place] IN MAY 2019. RARE CHEST PAIN. SAW HEART DOCTOR AND DECLINED WORKUP. WEIGHT UP 9 LBS SINCE AUG 2018.  Past Medical History:  Diagnosis Date  . Bowel incontinence   . Colon polyp 1994 Malignant polyp   2005 NL BE; 2006 incomplete TCS DUE TO REDUNDNACY  . Diverticulosis   . Fibromyalgia   . Gastritis   . Gastroparesis 2009   65% RETENTION AT 2 HOURS  . GERD (gastroesophageal reflux disease)   . Hard of hearing   . Hernia of unspecified site of abdominal cavity without mention of obstruction or gangrene   . Hiatal hernia   . HIATAL HERNIA 06/23/2008   Qualifier: Diagnosis of  By: Craige Cotta    . HTN (hypertension)   . IBS (irritable bowel syndrome)   . Osteoporosis   . S/P endoscopy 2007, 2009   2007: single distal esophageal erosion, s/p 66 F dilation, no web/ring/stricture noted; 2009: normal, Bravo with adequate suppression on Prevacid BID  . Splenic lesion HAMARTOMA  . TB (tuberculosis) AGE 63  . Wears dentures    top  . Wears glasses   . Wears hearing aid    Past Surgical History:  Procedure Laterality Date  . BACK SURGERY  2014   lumbar lam  . Benign tumor removed from bilateral breast    . cataract surgery    . COCHLEAR IMPLANT Left 09/21/2014   Procedure: COCHLEAR IMPLANT LEFT EAR;  Surgeon: Fannie Knee, MD;  Location: [place];  Service: ENT;  Laterality: Left;  . COLONOSCOPY W/ POLYPECTOMY    . ESOPHAGOGASTRODUODENOSCOPY  08/27/2011   hiatal hernia/mild gastritis  .  ESOPHAGOGASTRODUODENOSCOPY  05/25/2008    Normal esophagus without evidence of Barrett mass/  Normal stomach, duodenal bulb, and second portion of the duodenum  . ESOPHAGOGASTRODUODENOSCOPY  08/11/2006   Single erosion at distal esophagus and a small sliding hiatal hernia/ No evidence of ring or stricture but esophagus was dilated by passing 33 [place] Maloney dilator/ Prepyloric antral erythema but no evidence of erosions or peptic ulcer disease.  . ESOPHAGOGASTRODUODENOSCOPY  09/03/2005   Small sliding hiatal hernia without evidence of ring or  stricture formation. Esophagus dilated by passing 54-French Maloney dilator   given history of dysphagia  . GIVENS CAPSULE STUDY  05/27/2008   Adequate acid suppression with proton pump inhibitor.  [name] LEFT WRIST CYST REMOVAL    . LUNG SURGERY for pulmonary nodules    . Mass removed from roof of mouth    . SAVORY DILATION  08/27/2011   stricture in the distal esophagus  . UPPER GASTROINTESTINAL ENDOSCOPY  2006  . X-STOP IMPLANTATION     No Known Allergies  Current Outpatient Medications  Medication Sig    . calcium carbonate (TUMS - DOSED IN MG ELEMENTAL CALCIUM) 500 MG chewable tablet Chew 1 tablet by mouth as needed. gas    . dicyclomine (BENTYL) 10 MG capsule 1 po every other night followed  by another dose in the morning. YOU MAY USE ONE AT 12 NOON IF NEEDED. (Patient taking differently: Takes only as needed) PRN   . fexofenadine (ALLEGRA) 180 MG tablet Take 180 mg by mouth daily.    .      . LORazepam (ATIVAN) 1 MG tablet Take 0.5 mg by mouth at bedtime.     . Multiple Vitamin (MULTIVITAMIN WITH MINERALS) TABS tablet Take 1 tablet by mouth daily.    . Multiple Vitamins-Minerals (PRESERVISION AREDS PO) Take by mouth daily.    . multivitamin-lutein (OCUVITE-LUTEIN) CAPS Take 1 capsule by mouth daily.     . mupirocin ointment (BACTROBAN) 2 % Apply 1 application topically daily as needed.    . naproxen sodium (ALEVE) 220 MG tablet Take 220 mg by  mouth 2 (two) times daily as needed (Pain).    [name] omeprazole (PRILOSEC) 20 MG capsule Take 1 capsule (20 mg total) by mouth 2 (two) times daily before a meal. (Patient taking differently: Take 20 mg by mouth daily. )    . Polyethyl Glycol-Propyl Glycol (SYSTANE OP) Place 1 drop into both eyes daily.    . polyethylene glycol (MIRALAX / GLYCOLAX) packet Take 17 g by mouth daily as needed.    . TOPROL XL 25 MG 24 hr tablet Take 25 mg by mouth 2 (two) times daily.     [name] venlafaxine XR (EFFEXOR-XR) 150 MG 24 hr capsule Take 150 mg by mouth daily.    .       Review of Systems PER HPI OTHERWISE ALL SYSTEMS ARE NEGATIVE.    Objective:   Physical Exam  Constitutional: She is oriented to person, place, and time. She appears well-developed and well-nourished. No distress.  HENT:  Head: Normocephalic and atraumatic.  Mouth/Throat: Oropharynx is clear and moist. No oropharyngeal exudate.  Eyes: Pupils are equal, round, and reactive to light. No scleral icterus.  Neck: Normal range of motion. Neck supple.  Cardiovascular: Normal rate, regular rhythm and normal heart sounds.  Pulmonary/Chest: Effort normal and breath sounds normal. No respiratory distress.  Abdominal: Soft. Bowel sounds are normal. She exhibits no distension. There is no tenderness.  Musculoskeletal: She exhibits no edema.  Lymphadenopathy:    She has no cervical adenopathy.  Neurological: She is alert and oriented to person, place, and time.  NO  NEW FOCAL DEFICITS  Psychiatric: She has a normal mood and affect.  Vitals reviewed.     Assessment & Plan:

## 2018-01-07 NOTE — Progress Notes (Signed)
CC'D TO PCP °

## 2018-01-15 DIAGNOSIS — I10 Essential (primary) hypertension: Secondary | ICD-10-CM | POA: Diagnosis not present

## 2018-01-15 DIAGNOSIS — E876 Hypokalemia: Secondary | ICD-10-CM | POA: Diagnosis not present

## 2018-01-15 DIAGNOSIS — E782 Mixed hyperlipidemia: Secondary | ICD-10-CM | POA: Diagnosis not present

## 2018-01-15 DIAGNOSIS — R7301 Impaired fasting glucose: Secondary | ICD-10-CM | POA: Diagnosis not present

## 2018-01-20 DIAGNOSIS — K146 Glossodynia: Secondary | ICD-10-CM | POA: Diagnosis not present

## 2018-01-20 DIAGNOSIS — R0600 Dyspnea, unspecified: Secondary | ICD-10-CM | POA: Diagnosis not present

## 2018-01-20 DIAGNOSIS — Z9889 Other specified postprocedural states: Secondary | ICD-10-CM | POA: Diagnosis not present

## 2018-01-20 DIAGNOSIS — T7840XA Allergy, unspecified, initial encounter: Secondary | ICD-10-CM | POA: Diagnosis not present

## 2018-01-20 DIAGNOSIS — R69 Illness, unspecified: Secondary | ICD-10-CM | POA: Diagnosis not present

## 2018-01-20 DIAGNOSIS — J309 Allergic rhinitis, unspecified: Secondary | ICD-10-CM | POA: Diagnosis not present

## 2018-01-20 DIAGNOSIS — E782 Mixed hyperlipidemia: Secondary | ICD-10-CM | POA: Diagnosis not present

## 2018-01-20 DIAGNOSIS — E8809 Other disorders of plasma-protein metabolism, not elsewhere classified: Secondary | ICD-10-CM | POA: Diagnosis not present

## 2018-01-20 DIAGNOSIS — I10 Essential (primary) hypertension: Secondary | ICD-10-CM | POA: Diagnosis not present

## 2018-01-20 DIAGNOSIS — K5900 Constipation, unspecified: Secondary | ICD-10-CM | POA: Diagnosis not present

## 2018-02-02 DIAGNOSIS — L299 Pruritus, unspecified: Secondary | ICD-10-CM | POA: Diagnosis not present

## 2018-02-09 DIAGNOSIS — M5416 Radiculopathy, lumbar region: Secondary | ICD-10-CM | POA: Diagnosis not present

## 2018-02-20 ENCOUNTER — Other Ambulatory Visit (HOSPITAL_COMMUNITY): Payer: Self-pay | Admitting: Adult Health Nurse Practitioner

## 2018-02-20 ENCOUNTER — Ambulatory Visit (HOSPITAL_COMMUNITY)
Admission: RE | Admit: 2018-02-20 | Discharge: 2018-02-20 | Disposition: A | Discharge status: Home / Self Care | Payer: Medicare HMO | Source: Ambulatory Visit | Attending: Adult Health Nurse Practitioner | Admitting: Adult Health Nurse Practitioner

## 2018-02-20 DIAGNOSIS — E782 Mixed hyperlipidemia: Secondary | ICD-10-CM | POA: Diagnosis not present

## 2018-02-20 DIAGNOSIS — R0600 Dyspnea, unspecified: Secondary | ICD-10-CM | POA: Diagnosis not present

## 2018-02-20 DIAGNOSIS — T7840XA Allergy, unspecified, initial encounter: Secondary | ICD-10-CM | POA: Diagnosis not present

## 2018-02-20 DIAGNOSIS — M25531 Pain in right wrist: Secondary | ICD-10-CM | POA: Diagnosis not present

## 2018-02-20 DIAGNOSIS — X58XXXA Exposure to other specified factors, initial encounter: Secondary | ICD-10-CM | POA: Insufficient documentation

## 2018-02-20 DIAGNOSIS — R52 Pain, unspecified: Secondary | ICD-10-CM

## 2018-02-20 DIAGNOSIS — S52501A Unspecified fracture of the lower end of right radius, initial encounter for closed fracture: Secondary | ICD-10-CM | POA: Diagnosis not present

## 2018-02-20 DIAGNOSIS — J309 Allergic rhinitis, unspecified: Secondary | ICD-10-CM | POA: Diagnosis not present

## 2018-02-20 DIAGNOSIS — S52591A Other fractures of lower end of right radius, initial encounter for closed fracture: Secondary | ICD-10-CM | POA: Diagnosis not present

## 2018-02-20 DIAGNOSIS — S52502A Unspecified fracture of the lower end of left radius, initial encounter for closed fracture: Secondary | ICD-10-CM | POA: Diagnosis not present

## 2018-02-20 DIAGNOSIS — M79601 Pain in right arm: Secondary | ICD-10-CM | POA: Diagnosis not present

## 2018-02-20 DIAGNOSIS — E8809 Other disorders of plasma-protein metabolism, not elsewhere classified: Secondary | ICD-10-CM | POA: Diagnosis not present

## 2018-02-20 DIAGNOSIS — W19XXXA Unspecified fall, initial encounter: Secondary | ICD-10-CM

## 2018-02-20 DIAGNOSIS — M11231 Other chondrocalcinosis, right wrist: Secondary | ICD-10-CM | POA: Insufficient documentation

## 2018-02-20 DIAGNOSIS — Z6824 Body mass index (BMI) 24.0-24.9, adult: Secondary | ICD-10-CM | POA: Diagnosis not present

## 2018-02-20 DIAGNOSIS — I10 Essential (primary) hypertension: Secondary | ICD-10-CM | POA: Diagnosis not present

## 2018-02-20 DIAGNOSIS — K146 Glossodynia: Secondary | ICD-10-CM | POA: Diagnosis not present

## 2018-02-23 ENCOUNTER — Other Ambulatory Visit: Payer: Self-pay | Admitting: Gastroenterology

## 2018-02-26 DIAGNOSIS — R69 Illness, unspecified: Secondary | ICD-10-CM | POA: Diagnosis not present

## 2018-02-26 DIAGNOSIS — K08409 Partial loss of teeth, unspecified cause, unspecified class: Secondary | ICD-10-CM | POA: Diagnosis not present

## 2018-02-26 DIAGNOSIS — I471 Supraventricular tachycardia: Secondary | ICD-10-CM | POA: Diagnosis not present

## 2018-02-26 DIAGNOSIS — J309 Allergic rhinitis, unspecified: Secondary | ICD-10-CM | POA: Diagnosis not present

## 2018-02-26 DIAGNOSIS — H5789 Other specified disorders of eye and adnexa: Secondary | ICD-10-CM | POA: Diagnosis not present

## 2018-02-26 DIAGNOSIS — I10 Essential (primary) hypertension: Secondary | ICD-10-CM | POA: Diagnosis not present

## 2018-02-26 DIAGNOSIS — G8929 Other chronic pain: Secondary | ICD-10-CM | POA: Diagnosis not present

## 2018-02-26 DIAGNOSIS — G4700 Insomnia, unspecified: Secondary | ICD-10-CM | POA: Diagnosis not present

## 2018-02-26 DIAGNOSIS — I499 Cardiac arrhythmia, unspecified: Secondary | ICD-10-CM | POA: Diagnosis not present

## 2018-03-04 DIAGNOSIS — S52502D Unspecified fracture of the lower end of left radius, subsequent encounter for closed fracture with routine healing: Secondary | ICD-10-CM | POA: Diagnosis not present

## 2018-04-01 DIAGNOSIS — S52502D Unspecified fracture of the lower end of left radius, subsequent encounter for closed fracture with routine healing: Secondary | ICD-10-CM | POA: Diagnosis not present

## 2018-05-04 DIAGNOSIS — S52502D Unspecified fracture of the lower end of left radius, subsequent encounter for closed fracture with routine healing: Secondary | ICD-10-CM | POA: Diagnosis not present

## 2018-05-15 ENCOUNTER — Encounter: Payer: Self-pay | Admitting: Gastroenterology

## 2018-06-02 DIAGNOSIS — T7840XA Allergy, unspecified, initial encounter: Secondary | ICD-10-CM | POA: Diagnosis not present

## 2018-06-02 DIAGNOSIS — R69 Illness, unspecified: Secondary | ICD-10-CM | POA: Diagnosis not present

## 2018-06-02 DIAGNOSIS — E782 Mixed hyperlipidemia: Secondary | ICD-10-CM | POA: Diagnosis not present

## 2018-06-02 DIAGNOSIS — J309 Allergic rhinitis, unspecified: Secondary | ICD-10-CM | POA: Diagnosis not present

## 2018-06-02 DIAGNOSIS — K5900 Constipation, unspecified: Secondary | ICD-10-CM | POA: Diagnosis not present

## 2018-06-02 DIAGNOSIS — I10 Essential (primary) hypertension: Secondary | ICD-10-CM | POA: Diagnosis not present

## 2018-06-10 DIAGNOSIS — H93299 Other abnormal auditory perceptions, unspecified ear: Secondary | ICD-10-CM | POA: Diagnosis not present

## 2018-06-10 DIAGNOSIS — H903 Sensorineural hearing loss, bilateral: Secondary | ICD-10-CM | POA: Diagnosis not present

## 2018-06-22 DIAGNOSIS — M5416 Radiculopathy, lumbar region: Secondary | ICD-10-CM | POA: Diagnosis not present

## 2018-07-22 DIAGNOSIS — T7840XA Allergy, unspecified, initial encounter: Secondary | ICD-10-CM | POA: Diagnosis not present

## 2018-07-22 DIAGNOSIS — R69 Illness, unspecified: Secondary | ICD-10-CM | POA: Diagnosis not present

## 2018-07-22 DIAGNOSIS — J309 Allergic rhinitis, unspecified: Secondary | ICD-10-CM | POA: Diagnosis not present

## 2018-07-22 DIAGNOSIS — Z9889 Other specified postprocedural states: Secondary | ICD-10-CM | POA: Diagnosis not present

## 2018-07-22 DIAGNOSIS — K146 Glossodynia: Secondary | ICD-10-CM | POA: Diagnosis not present

## 2018-07-22 DIAGNOSIS — R7301 Impaired fasting glucose: Secondary | ICD-10-CM | POA: Diagnosis not present

## 2018-07-22 DIAGNOSIS — R0600 Dyspnea, unspecified: Secondary | ICD-10-CM | POA: Diagnosis not present

## 2018-07-22 DIAGNOSIS — I10 Essential (primary) hypertension: Secondary | ICD-10-CM | POA: Diagnosis not present

## 2018-07-22 DIAGNOSIS — E782 Mixed hyperlipidemia: Secondary | ICD-10-CM | POA: Diagnosis not present

## 2018-07-22 DIAGNOSIS — K5900 Constipation, unspecified: Secondary | ICD-10-CM | POA: Diagnosis not present

## 2018-07-22 DIAGNOSIS — E8809 Other disorders of plasma-protein metabolism, not elsewhere classified: Secondary | ICD-10-CM | POA: Diagnosis not present

## 2018-07-24 DIAGNOSIS — R42 Dizziness and giddiness: Secondary | ICD-10-CM | POA: Diagnosis not present

## 2018-07-24 DIAGNOSIS — R0600 Dyspnea, unspecified: Secondary | ICD-10-CM | POA: Diagnosis not present

## 2018-07-24 DIAGNOSIS — I10 Essential (primary) hypertension: Secondary | ICD-10-CM | POA: Diagnosis not present

## 2018-07-24 DIAGNOSIS — Z23 Encounter for immunization: Secondary | ICD-10-CM | POA: Diagnosis not present

## 2018-07-24 DIAGNOSIS — J309 Allergic rhinitis, unspecified: Secondary | ICD-10-CM | POA: Diagnosis not present

## 2018-07-24 DIAGNOSIS — H905 Unspecified sensorineural hearing loss: Secondary | ICD-10-CM | POA: Diagnosis not present

## 2018-07-24 DIAGNOSIS — K581 Irritable bowel syndrome with constipation: Secondary | ICD-10-CM | POA: Diagnosis not present

## 2018-07-24 DIAGNOSIS — R69 Illness, unspecified: Secondary | ICD-10-CM | POA: Diagnosis not present

## 2018-07-24 DIAGNOSIS — E782 Mixed hyperlipidemia: Secondary | ICD-10-CM | POA: Diagnosis not present

## 2018-07-24 DIAGNOSIS — Z6824 Body mass index (BMI) 24.0-24.9, adult: Secondary | ICD-10-CM | POA: Diagnosis not present

## 2018-08-05 ENCOUNTER — Encounter: Payer: Self-pay | Admitting: Gastroenterology

## 2018-08-05 ENCOUNTER — Ambulatory Visit: Payer: Medicare HMO | Admitting: Gastroenterology

## 2018-08-05 DIAGNOSIS — K3184 Gastroparesis: Secondary | ICD-10-CM

## 2018-08-05 DIAGNOSIS — K581 Irritable bowel syndrome with constipation: Secondary | ICD-10-CM | POA: Diagnosis not present

## 2018-08-05 DIAGNOSIS — K219 Gastro-esophageal reflux disease without esophagitis: Secondary | ICD-10-CM

## 2018-08-05 NOTE — Progress Notes (Signed)
Subjective:    Patient ID: Whitney Galloway, female    DOB: Apr 30, 1929, 84 y.o.   MRN: [id number]  Celene Squibb, MD  HPI May have trouble getting dizzy when she changes position. Occasional diarrhea and fecal incontinence. TAKES BENTYL WHEN SHE HAS CRAMPS IN HER TUMMY BUT EATING BETTER. RARE BOWEL IRREGULARITY. OCCASIONAL CHEST PAIN. BMs: 1-4X/DAY. NAUSEA ASSOCIATED WITH DIZZINESS. WAS HAVING TROUBLE WITH CONSTIPATION AND GOT BETTER WITH MIRALAX. DRINKING WATER AND EATING MORE FIBER. GETS HUNGRY IN MIDDLE OF NIGHT AND THEN LAYS DOWN AND GETS THE HEARTBURN.   PT DENIES FEVER, CHILLS, HEMATOCHEZIA, HEMATEMESIS, nausea, vomiting, melena, diarrhea, SHORTNESS OF BREATH,  CHANGE IN BOWEL IN HABITS, OR problems swallowing.  Past Medical History:  Diagnosis Date  . Bowel incontinence   . Colon polyp 1994 Malignant polyp   2005 NL BE; 2006 incomplete TCS DUE TO REDUNDNACY  . Diverticulosis   . Fibromyalgia   . Gastritis   . Gastroparesis 2009   65% RETENTION AT 2 HOURS  . GERD (gastroesophageal reflux disease)   . Hard of hearing   . Hernia of unspecified site of abdominal cavity without mention of obstruction or gangrene   . Hiatal hernia   . HIATAL HERNIA 06/23/2008   Qualifier: Diagnosis of  By: Craige Cotta    . HTN (hypertension)   . IBS (irritable bowel syndrome)   . Osteoporosis   . S/P endoscopy 2007, 2009   2007: single distal esophageal erosion, s/p 23 F dilation, no web/ring/stricture noted; 2009: normal, Bravo with adequate suppression on Prevacid BID  . Splenic lesion HAMARTOMA  . TB (tuberculosis) AGE 34  . Wears dentures    top  . Wears glasses   . Wears hearing aid    Past Surgical History:  Procedure Laterality Date  . BACK SURGERY  2014   lumbar lam  . Benign tumor removed from bilateral breast    . cataract surgery    . COCHLEAR IMPLANT Left 09/21/2014   Procedure: COCHLEAR IMPLANT LEFT EAR;  Surgeon: Fannie Knee, MD;  Location: [facility];   Service: ENT;  Laterality: Left;  . COLONOSCOPY W/ POLYPECTOMY    . ESOPHAGOGASTRODUODENOSCOPY  08/27/2011   hiatal hernia/mild gastritis  . ESOPHAGOGASTRODUODENOSCOPY  05/25/2008    Normal esophagus without evidence of Barrett mass/  Normal stomach, duodenal bulb, and second portion of the duodenum  . ESOPHAGOGASTRODUODENOSCOPY  08/11/2006   Single erosion at distal esophagus and a small sliding hiatal hernia/ No evidence of ring or stricture but esophagus was dilated by passing 24 [place] Maloney dilator/ Prepyloric antral erythema but no evidence of erosions or peptic ulcer disease.  . ESOPHAGOGASTRODUODENOSCOPY  09/03/2005   Small sliding hiatal hernia without evidence of ring or  stricture formation. Esophagus dilated by passing 54-French Maloney dilator   given history of dysphagia  . GIVENS CAPSULE STUDY  05/27/2008   Adequate acid suppression with proton pump inhibitor.  [name] LEFT WRIST CYST REMOVAL    . LUNG SURGERY for pulmonary nodules    . Mass removed from roof of mouth    . SAVORY DILATION  08/27/2011   stricture in the distal esophagus  . UPPER GASTROINTESTINAL ENDOSCOPY  2006  . X-STOP IMPLANTATION     No Known Allergies  Current Outpatient Medications  Medication Sig    . amlodipine-atorvastatin (CADUET) 10-20 MG tablet Take 1 tablet by mouth daily.    . calcium carbonate (TUMS - DOSED IN MG ELEMENTAL CALCIUM) 500 MG chewable tablet  Chew 1 tablet by mouth as needed. gas    . dicyclomine (BENTYL) 10 MG capsule 1 po every other night followed by another dose in the morning. YOU MAY USE ONE AT 12 NOON IF NEEDED. (Patient taking differently: every other day. Takes only as needed)    . LORazepam (ATIVAN) 1 MG tablet Take 1 mg by mouth at bedtime.     . Multiple Vitamin (MULTIVITAMIN WITH MINERALS) TABS tablet Take 1 tablet by mouth daily.    . Multiple Vitamins-Minerals (PRESERVISION AREDS PO) Take by mouth daily.    . multivitamin-lutein (OCUVITE-LUTEIN) CAPS Take 1 capsule by  mouth daily.     . naproxen sodium (ALEVE) 220 MG tablet Take 220 mg by mouth 2 (two) times daily as needed (Pain).    [name] omeprazole (PRILOSEC) 20 MG capsule TAKE 1 CAPSULE TWICE A DAY BEFORE MEALS    . Polyethyl Glycol-Propyl Glycol (SYSTANE OP) Place 1 drop into both eyes daily.    . polyethylene glycol (MIRALAX / GLYCOLAX) packet Take 17 g by mouth daily as needed.    . Probiotic Product (PROBIOTIC DAILY PO) Take by mouth every other day.    . TOPROL XL 25 MG 24 hr tablet Take 25 mg by mouth 2 (two) times daily.     . Venlafaxine HCl 225 MG TB24 Take 225 mg by mouth daily.     . fexofenadine (ALLEGRA) 180 MG tablet Take 180 mg by mouth daily.    .      . mupirocin ointment (BACTROBAN) 2 % Apply 1 application topically daily as needed.     Review of Systems PER HPI OTHERWISE ALL SYSTEMS ARE NEGATIVE.    Objective:   Physical Exam  Constitutional: She is oriented to person, place, and time. She appears well-developed and well-nourished. No distress.  HENT:  Head: Normocephalic and atraumatic.  Mouth/Throat: Oropharynx is clear and moist. No oropharyngeal exudate.  Eyes: Pupils are equal, round, and reactive to light. No scleral icterus.  Neck: Normal range of motion. Neck supple.  Cardiovascular: Normal rate, regular rhythm and normal heart sounds.  Pulmonary/Chest: Effort normal and breath sounds normal. No respiratory distress.  Abdominal: Soft. Bowel sounds are normal. She exhibits no distension. There is no tenderness.  Musculoskeletal: She exhibits no edema.  Lymphadenopathy:    She has no cervical adenopathy.  Neurological: She is alert and oriented to person, place, and time.  NO NEW FOCAL DEFICITS  Psychiatric: She has a normal mood and affect.  Vitals reviewed.     Assessment & Plan:

## 2018-08-05 NOTE — Patient Instructions (Signed)
DRINK WATER TO KEEP YOUR URINE LIGHT YELLOW.  FOLLOW A HIGH FIBER DIET. AVOID ITEMS THAT CAUSE BLOATING & GAS. .  TO PREVENT CONSTIPATION, USE MIRALAX 1 SCOOP ONCE OR TWICE DAILY IN 8 OZ OF LIQUID.  FOLLOW UP IN 6 MOS.   PLEASE CALL WITH QUESTIONS OR CONCERNS.

## 2018-08-05 NOTE — Assessment & Plan Note (Signed)
SYMPTOMS CONTROLLED/RESOLVED.  CONTINUE TO MONITOR SYMPTOMS. 

## 2018-08-05 NOTE — Assessment & Plan Note (Signed)
W/ CONSTIPATION. SYMPTOMS FAIRLY WELL CONTROLLED.   DRINK WATER TO KEEP YOUR URINE LIGHT YELLOW. FOLLOW A HIGH FIBER DIET. AVOID ITEMS THAT CAUSE BLOATING & GAS. . TO PREVENT CONSTIPATION, USE MIRALAX 1 SCOOP ONCE OR TWICE DAILY IN 8 OZ OF LIQUID. FOLLOW UP IN 6 MOS.  PLEASE CALL WITH QUESTIONS OR CONCERNS.

## 2018-08-05 NOTE — Assessment & Plan Note (Signed)
SYMPTOMS FAIRLY WELL CONTROLLED AND DECLINED VISCOUS LIDOCAINE/MAALOX OR MYLANTA.  CONTINUE TO MONITOR SYMPTOMS.

## 2018-08-06 NOTE — Progress Notes (Signed)
cc'ed to pcp °

## 2018-08-06 NOTE — Progress Notes (Signed)
ON RECALL  °

## 2018-10-08 DIAGNOSIS — M5416 Radiculopathy, lumbar region: Secondary | ICD-10-CM | POA: Diagnosis not present

## 2018-10-08 DIAGNOSIS — M47817 Spondylosis without myelopathy or radiculopathy, lumbosacral region: Secondary | ICD-10-CM | POA: Diagnosis not present

## 2018-10-08 DIAGNOSIS — I10 Essential (primary) hypertension: Secondary | ICD-10-CM | POA: Diagnosis not present

## 2018-10-19 DIAGNOSIS — M5416 Radiculopathy, lumbar region: Secondary | ICD-10-CM | POA: Diagnosis not present

## 2018-11-11 DIAGNOSIS — Z9842 Cataract extraction status, left eye: Secondary | ICD-10-CM | POA: Diagnosis not present

## 2018-11-11 DIAGNOSIS — H5200 Hypermetropia, unspecified eye: Secondary | ICD-10-CM | POA: Diagnosis not present

## 2018-11-11 DIAGNOSIS — Z961 Presence of intraocular lens: Secondary | ICD-10-CM | POA: Diagnosis not present

## 2018-11-11 DIAGNOSIS — H26491 Other secondary cataract, right eye: Secondary | ICD-10-CM | POA: Diagnosis not present

## 2018-11-24 DIAGNOSIS — M5416 Radiculopathy, lumbar region: Secondary | ICD-10-CM | POA: Diagnosis not present

## 2018-11-24 DIAGNOSIS — I10 Essential (primary) hypertension: Secondary | ICD-10-CM | POA: Diagnosis not present

## 2018-11-24 DIAGNOSIS — M47817 Spondylosis without myelopathy or radiculopathy, lumbosacral region: Secondary | ICD-10-CM | POA: Diagnosis not present

## 2018-12-21 ENCOUNTER — Encounter: Payer: Self-pay | Admitting: Gastroenterology

## 2018-12-21 DIAGNOSIS — H26493 Other secondary cataract, bilateral: Secondary | ICD-10-CM | POA: Diagnosis not present

## 2019-01-04 DIAGNOSIS — R69 Illness, unspecified: Secondary | ICD-10-CM | POA: Diagnosis not present

## 2019-01-04 DIAGNOSIS — J309 Allergic rhinitis, unspecified: Secondary | ICD-10-CM | POA: Diagnosis not present

## 2019-01-04 DIAGNOSIS — E782 Mixed hyperlipidemia: Secondary | ICD-10-CM | POA: Diagnosis not present

## 2019-01-04 DIAGNOSIS — T7840XA Allergy, unspecified, initial encounter: Secondary | ICD-10-CM | POA: Diagnosis not present

## 2019-01-04 DIAGNOSIS — I10 Essential (primary) hypertension: Secondary | ICD-10-CM | POA: Diagnosis not present

## 2019-01-19 DIAGNOSIS — Z0000 Encounter for general adult medical examination without abnormal findings: Secondary | ICD-10-CM | POA: Diagnosis not present

## 2019-01-27 DIAGNOSIS — R7301 Impaired fasting glucose: Secondary | ICD-10-CM | POA: Diagnosis not present

## 2019-01-27 DIAGNOSIS — Z1329 Encounter for screening for other suspected endocrine disorder: Secondary | ICD-10-CM | POA: Diagnosis not present

## 2019-01-27 DIAGNOSIS — E782 Mixed hyperlipidemia: Secondary | ICD-10-CM | POA: Diagnosis not present

## 2019-01-27 DIAGNOSIS — I10 Essential (primary) hypertension: Secondary | ICD-10-CM | POA: Diagnosis not present

## 2019-02-01 DIAGNOSIS — K219 Gastro-esophageal reflux disease without esophagitis: Secondary | ICD-10-CM | POA: Diagnosis not present

## 2019-02-01 DIAGNOSIS — I10 Essential (primary) hypertension: Secondary | ICD-10-CM | POA: Diagnosis not present

## 2019-02-01 DIAGNOSIS — R69 Illness, unspecified: Secondary | ICD-10-CM | POA: Diagnosis not present

## 2019-02-01 DIAGNOSIS — J309 Allergic rhinitis, unspecified: Secondary | ICD-10-CM | POA: Diagnosis not present

## 2019-02-01 DIAGNOSIS — K581 Irritable bowel syndrome with constipation: Secondary | ICD-10-CM | POA: Diagnosis not present

## 2019-02-01 DIAGNOSIS — E782 Mixed hyperlipidemia: Secondary | ICD-10-CM | POA: Diagnosis not present

## 2019-02-03 DIAGNOSIS — K146 Glossodynia: Secondary | ICD-10-CM | POA: Diagnosis not present

## 2019-02-03 DIAGNOSIS — J309 Allergic rhinitis, unspecified: Secondary | ICD-10-CM | POA: Diagnosis not present

## 2019-02-03 DIAGNOSIS — R69 Illness, unspecified: Secondary | ICD-10-CM | POA: Diagnosis not present

## 2019-02-03 DIAGNOSIS — R0600 Dyspnea, unspecified: Secondary | ICD-10-CM | POA: Diagnosis not present

## 2019-02-03 DIAGNOSIS — Z9889 Other specified postprocedural states: Secondary | ICD-10-CM | POA: Diagnosis not present

## 2019-02-03 DIAGNOSIS — K5900 Constipation, unspecified: Secondary | ICD-10-CM | POA: Diagnosis not present

## 2019-02-03 DIAGNOSIS — E782 Mixed hyperlipidemia: Secondary | ICD-10-CM | POA: Diagnosis not present

## 2019-02-03 DIAGNOSIS — E8809 Other disorders of plasma-protein metabolism, not elsewhere classified: Secondary | ICD-10-CM | POA: Diagnosis not present

## 2019-02-03 DIAGNOSIS — T7840XA Allergy, unspecified, initial encounter: Secondary | ICD-10-CM | POA: Diagnosis not present

## 2019-02-03 DIAGNOSIS — I10 Essential (primary) hypertension: Secondary | ICD-10-CM | POA: Diagnosis not present

## 2019-02-05 DIAGNOSIS — F419 Anxiety disorder, unspecified: Secondary | ICD-10-CM | POA: Diagnosis not present

## 2019-02-05 DIAGNOSIS — K219 Gastro-esophageal reflux disease without esophagitis: Secondary | ICD-10-CM | POA: Diagnosis not present

## 2019-02-05 DIAGNOSIS — R69 Illness, unspecified: Secondary | ICD-10-CM | POA: Diagnosis not present

## 2019-02-05 DIAGNOSIS — J302 Other seasonal allergic rhinitis: Secondary | ICD-10-CM | POA: Diagnosis not present

## 2019-02-05 DIAGNOSIS — I10 Essential (primary) hypertension: Secondary | ICD-10-CM | POA: Diagnosis not present

## 2019-02-05 DIAGNOSIS — K581 Irritable bowel syndrome with constipation: Secondary | ICD-10-CM | POA: Diagnosis not present

## 2019-02-05 DIAGNOSIS — M1990 Unspecified osteoarthritis, unspecified site: Secondary | ICD-10-CM | POA: Diagnosis not present

## 2019-02-05 DIAGNOSIS — E785 Hyperlipidemia, unspecified: Secondary | ICD-10-CM | POA: Diagnosis not present

## 2019-02-05 DIAGNOSIS — G8929 Other chronic pain: Secondary | ICD-10-CM | POA: Diagnosis not present

## 2019-02-05 DIAGNOSIS — H3530 Unspecified macular degeneration: Secondary | ICD-10-CM | POA: Diagnosis not present

## 2019-02-22 DIAGNOSIS — E782 Mixed hyperlipidemia: Secondary | ICD-10-CM | POA: Diagnosis not present

## 2019-02-22 DIAGNOSIS — E8809 Other disorders of plasma-protein metabolism, not elsewhere classified: Secondary | ICD-10-CM | POA: Diagnosis not present

## 2019-02-22 DIAGNOSIS — I10 Essential (primary) hypertension: Secondary | ICD-10-CM | POA: Diagnosis not present

## 2019-02-22 DIAGNOSIS — R69 Illness, unspecified: Secondary | ICD-10-CM | POA: Diagnosis not present

## 2019-03-15 DIAGNOSIS — M5416 Radiculopathy, lumbar region: Secondary | ICD-10-CM | POA: Diagnosis not present

## 2019-03-23 DIAGNOSIS — E8809 Other disorders of plasma-protein metabolism, not elsewhere classified: Secondary | ICD-10-CM | POA: Diagnosis not present

## 2019-03-23 DIAGNOSIS — I10 Essential (primary) hypertension: Secondary | ICD-10-CM | POA: Diagnosis not present

## 2019-03-23 DIAGNOSIS — E782 Mixed hyperlipidemia: Secondary | ICD-10-CM | POA: Diagnosis not present

## 2019-03-23 DIAGNOSIS — T7840XA Allergy, unspecified, initial encounter: Secondary | ICD-10-CM | POA: Diagnosis not present

## 2019-03-23 DIAGNOSIS — J309 Allergic rhinitis, unspecified: Secondary | ICD-10-CM | POA: Diagnosis not present

## 2019-04-15 DIAGNOSIS — M48061 Spinal stenosis, lumbar region without neurogenic claudication: Secondary | ICD-10-CM | POA: Diagnosis not present

## 2019-04-15 DIAGNOSIS — M5417 Radiculopathy, lumbosacral region: Secondary | ICD-10-CM | POA: Diagnosis not present

## 2019-05-03 ENCOUNTER — Other Ambulatory Visit: Payer: Self-pay | Admitting: Nurse Practitioner

## 2019-06-17 DIAGNOSIS — M5416 Radiculopathy, lumbar region: Secondary | ICD-10-CM | POA: Diagnosis not present

## 2019-06-22 DIAGNOSIS — T7840XA Allergy, unspecified, initial encounter: Secondary | ICD-10-CM | POA: Diagnosis not present

## 2019-06-22 DIAGNOSIS — E8809 Other disorders of plasma-protein metabolism, not elsewhere classified: Secondary | ICD-10-CM | POA: Diagnosis not present

## 2019-06-22 DIAGNOSIS — J309 Allergic rhinitis, unspecified: Secondary | ICD-10-CM | POA: Diagnosis not present

## 2019-06-22 DIAGNOSIS — I10 Essential (primary) hypertension: Secondary | ICD-10-CM | POA: Diagnosis not present

## 2019-06-22 DIAGNOSIS — E782 Mixed hyperlipidemia: Secondary | ICD-10-CM | POA: Diagnosis not present

## 2019-07-14 DIAGNOSIS — M47817 Spondylosis without myelopathy or radiculopathy, lumbosacral region: Secondary | ICD-10-CM | POA: Diagnosis not present

## 2019-07-14 DIAGNOSIS — M5416 Radiculopathy, lumbar region: Secondary | ICD-10-CM | POA: Diagnosis not present

## 2019-07-14 DIAGNOSIS — M48061 Spinal stenosis, lumbar region without neurogenic claudication: Secondary | ICD-10-CM | POA: Diagnosis not present

## 2019-07-15 DIAGNOSIS — T7840XA Allergy, unspecified, initial encounter: Secondary | ICD-10-CM | POA: Diagnosis not present

## 2019-07-15 DIAGNOSIS — I10 Essential (primary) hypertension: Secondary | ICD-10-CM | POA: Diagnosis not present

## 2019-07-15 DIAGNOSIS — E8809 Other disorders of plasma-protein metabolism, not elsewhere classified: Secondary | ICD-10-CM | POA: Diagnosis not present

## 2019-07-15 DIAGNOSIS — E782 Mixed hyperlipidemia: Secondary | ICD-10-CM | POA: Diagnosis not present

## 2019-07-15 DIAGNOSIS — J309 Allergic rhinitis, unspecified: Secondary | ICD-10-CM | POA: Diagnosis not present

## 2019-07-31 ENCOUNTER — Emergency Department (HOSPITAL_COMMUNITY): Payer: Medicare HMO

## 2019-07-31 ENCOUNTER — Other Ambulatory Visit: Payer: Self-pay

## 2019-07-31 ENCOUNTER — Encounter (HOSPITAL_COMMUNITY): Payer: Self-pay

## 2019-07-31 ENCOUNTER — Emergency Department (HOSPITAL_COMMUNITY)
Admission: EM | Admit: 2019-07-31 | Discharge: 2019-07-31 | Disposition: A | Discharge status: Home / Self Care | Payer: Medicare HMO | Attending: Emergency Medicine | Admitting: Emergency Medicine

## 2019-07-31 DIAGNOSIS — W010XXA Fall on same level from slipping, tripping and stumbling without subsequent striking against object, initial encounter: Secondary | ICD-10-CM | POA: Diagnosis not present

## 2019-07-31 DIAGNOSIS — Y92009 Unspecified place in unspecified non-institutional (private) residence as the place of occurrence of the external cause: Secondary | ICD-10-CM | POA: Insufficient documentation

## 2019-07-31 DIAGNOSIS — I10 Essential (primary) hypertension: Secondary | ICD-10-CM | POA: Insufficient documentation

## 2019-07-31 DIAGNOSIS — Z87891 Personal history of nicotine dependence: Secondary | ICD-10-CM | POA: Diagnosis not present

## 2019-07-31 DIAGNOSIS — S8991XA Unspecified injury of right lower leg, initial encounter: Secondary | ICD-10-CM | POA: Diagnosis present

## 2019-07-31 DIAGNOSIS — Y939 Activity, unspecified: Secondary | ICD-10-CM | POA: Insufficient documentation

## 2019-07-31 DIAGNOSIS — Y999 Unspecified external cause status: Secondary | ICD-10-CM | POA: Diagnosis not present

## 2019-07-31 DIAGNOSIS — S82831A Other fracture of upper and lower end of right fibula, initial encounter for closed fracture: Secondary | ICD-10-CM | POA: Insufficient documentation

## 2019-07-31 DIAGNOSIS — Z79899 Other long term (current) drug therapy: Secondary | ICD-10-CM | POA: Insufficient documentation

## 2019-07-31 DIAGNOSIS — S82434A Nondisplaced oblique fracture of shaft of right fibula, initial encounter for closed fracture: Secondary | ICD-10-CM | POA: Diagnosis not present

## 2019-07-31 NOTE — ED Provider Notes (Signed)
[facility] EMERGENCY DEPARTMENT Provider Note   CSN: [id number] Arrival date & time: 07/31/19  2107     History   Chief Complaint Chief Complaint  Patient presents with  . Fall    right ankle pain    HPI Whitney Galloway is a 85 y.o. female.     85 year old female presents the emergency room with a friend, patient lives home alone, states that she tripped over her cane while walking today and rolled her ankle, landing on her foot.  Patient denies hitting her head, loss of consciousness or any other injuries.  Patient denies any new pain in her neck, back, upper extremities or hips.  Patient reports pain isolated to her right lateral ankle.  Patient is able to bear weight with pain while using her cane.     Past Medical History:  Diagnosis Date  . Bowel incontinence   . Colon polyp 1994 Malignant polyp   2005 NL BE; 2006 incomplete TCS DUE TO REDUNDNACY  . Diverticulosis   . Fibromyalgia   . Gastritis   . Gastroparesis 2009   65% RETENTION AT 2 HOURS  . GERD (gastroesophageal reflux disease)   . Hard of hearing   . Hernia of unspecified site of abdominal cavity without mention of obstruction or gangrene   . Hiatal hernia   . HIATAL HERNIA 06/23/2008   Qualifier: Diagnosis of  By: Craige Cotta    . HTN (hypertension)   . IBS (irritable bowel syndrome)   . Osteoporosis   . S/P endoscopy 2007, 2009   2007: single distal esophageal erosion, s/p 75 F dilation, no web/ring/stricture noted; 2009: normal, Bravo with adequate suppression on Prevacid BID  . Splenic lesion HAMARTOMA  . TB (tuberculosis) AGE 6  . Wears dentures    top  . Wears glasses   . Wears hearing aid     Patient Active Problem List   Diagnosis Date Noted  . Postop check 09/21/2014  . Breast cancer, 1971, status post right lumpectomy 01/04/2014  . Hemorrhoids, internal, with bleeding 10/14/2013  . Anemia 09/09/2013  . History of spinal surgery 12/17/2012  . Difficulty hearing 12/17/2012  .  Herniated nucleus pulposus, L3-4 with stenosis and spondylolisthesis 10/20/2012  . Night sweats 12/26/2011  . Stiffness of joints, not elsewhere classified, multiple sites 12/19/2011  . Weakness of left leg 12/19/2011  . Difficulty in walking(719.7) 12/19/2011  . GASTROPARESIS 11/03/2009  . COLONIC POLYPS, ADENOMATOUS 06/23/2008  . UNSPECIFIED DISEASE OF SPLEEN 06/23/2008  . DEPRESSION/ANXIETY 06/23/2008  . HYPERTENSION 06/23/2008  . ALLERGIC RHINITIS, SEASONAL 06/23/2008  . GASTROESOPHAGEAL REFLUX DISEASE, CHRONIC 06/23/2008  . DIVERTICULOSIS OF COLON 06/23/2008  . Irritable bowel syndrome 06/23/2008  . ECZEMA 06/23/2008  . OSTEOARTHRITIS, SPINE 06/23/2008  . DEGENERATIVE DISC DISEASE 06/23/2008  . OTHER DYSPHAGIA 06/23/2008  . Incontinence of feces 06/23/2008  . TOBACCO USE, QUIT 06/23/2008    Past Surgical History:  Procedure Laterality Date  . BACK SURGERY  2014   lumbar lam  . Benign tumor removed from bilateral breast    . cataract surgery    . COCHLEAR IMPLANT Left 09/21/2014   Procedure: COCHLEAR IMPLANT LEFT EAR;  Surgeon: Fannie Knee, MD;  Location: [facility];  Service: ENT;  Laterality: Left;  . COLONOSCOPY W/ POLYPECTOMY    . ESOPHAGOGASTRODUODENOSCOPY  08/27/2011   hiatal hernia/mild gastritis  . ESOPHAGOGASTRODUODENOSCOPY  05/25/2008    Normal esophagus without evidence of Barrett mass/  Normal stomach, duodenal bulb, and second portion of  the duodenum  . ESOPHAGOGASTRODUODENOSCOPY  08/11/2006   Single erosion at distal esophagus and a small sliding hiatal hernia/ No evidence of ring or stricture but esophagus was dilated by passing 41 [place] Maloney dilator/ Prepyloric antral erythema but no evidence of erosions or peptic ulcer disease.  . ESOPHAGOGASTRODUODENOSCOPY  09/03/2005   Small sliding hiatal hernia without evidence of ring or  stricture formation. Esophagus dilated by passing 54-French Maloney dilator   given history of dysphagia  .  GIVENS CAPSULE STUDY  05/27/2008   Adequate acid suppression with proton pump inhibitor.  [name] LEFT WRIST CYST REMOVAL    . LUNG SURGERY for pulmonary nodules    . Mass removed from roof of mouth    . SAVORY DILATION  08/27/2011   stricture in the distal esophagus  . UPPER GASTROINTESTINAL ENDOSCOPY  2006  . X-STOP IMPLANTATION       OB History   No obstetric history on file.      Home Medications    Prior to Admission medications   Medication Sig Start Date End Date Taking? Authorizing Provider  amlodipine-atorvastatin (CADUET) 10-20 MG tablet Take 1 tablet by mouth daily.    [provider]  calcium carbonate (TUMS - DOSED IN MG ELEMENTAL CALCIUM) 500 MG chewable tablet Chew 1 tablet by mouth as needed. gas    [provider]  dicyclomine (BENTYL) 10 MG capsule 1 po every other night followed by another dose in the morning. YOU MAY USE ONE AT 12 NOON IF NEEDED. Patient taking differently: every other day. Takes only as needed 06/19/16   Mahala Menghini, PA-C  fexofenadine (ALLEGRA) 180 MG tablet Take 180 mg by mouth daily.    [provider]  lidocaine (XYLOCAINE) 2 % solution 2 TSP  PO QAC AN DHS IF NEEDED FOR ABDOMINAL OR CHEST PAIN. MAY REPEAT DOSE EVERY 4 HOURS. NO MORE THAN 8 DOSES A DAY. Patient not taking: Reported on 08/05/2018 06/05/17   Danie Binder, MD  LORazepam (ATIVAN) 1 MG tablet Take 1 mg by mouth at bedtime.     [provider]  Multiple Vitamin (MULTIVITAMIN WITH MINERALS) TABS tablet Take 1 tablet by mouth daily.    [provider]  Multiple Vitamins-Minerals (PRESERVISION AREDS PO) Take by mouth daily.    [provider]  multivitamin-lutein (OCUVITE-LUTEIN) CAPS Take 1 capsule by mouth daily.     [provider]  mupirocin ointment (BACTROBAN) 2 % Apply 1 application topically daily as needed. 07/12/15   [provider]  naproxen sodium (ALEVE) 220 MG tablet Take 220 mg by mouth 2 (two) times  daily as needed (Pain).    [provider]  omeprazole (PRILOSEC) 20 MG capsule TAKE 1 CAPSULE TWICE A DAY BEFORE MEALS 05/03/19   Mahala Menghini, PA-C  Polyethyl Glycol-Propyl Glycol (SYSTANE OP) Place 1 drop into both eyes daily.    [provider]  polyethylene glycol (MIRALAX / GLYCOLAX) packet Take 17 g by mouth daily as needed.    [provider]  Probiotic Product (PROBIOTIC DAILY PO) Take by mouth every other day.    [provider]  TOPROL XL 25 MG 24 hr tablet Take 25 mg by mouth 2 (two) times daily.  07/18/15   [provider]  Venlafaxine HCl 225 MG TB24 Take 225 mg by mouth daily.     [provider]    Family History No family history on file.  Social History Social History   Tobacco  Use  . Smoking status: Former Smoker    Quit date: 09/16/2004    Years since quitting: 14.8  . Smokeless tobacco: Never Used  . Tobacco comment: Quit x 8 years  Substance Use Topics  . Alcohol use: No  . Drug use: No     Allergies   Patient has no known allergies.   Review of Systems Review of Systems  Constitutional: Negative for fever.  Musculoskeletal: Positive for arthralgias, gait problem and joint swelling.  Skin: Negative for color change, rash and wound.  Allergic/Immunologic: Negative for immunocompromised state.  Neurological: Negative for weakness and numbness.  Hematological: Does not bruise/bleed easily.  Psychiatric/Behavioral: Negative for confusion.  All other systems reviewed and are negative.    Physical Exam Updated Vital Signs BP (!) 123/98 (BP Location: Right Arm)   Pulse 69   Temp 98.2 F (36.8 C) (Oral)   Resp 18   Ht 5\' 8"  (1.727 m)   Wt 65.8 kg   SpO2 98%   BMI 22.05 kg/m   Physical Exam Vitals signs and nursing note reviewed.  Constitutional:      General: She is not in acute distress.    Appearance: She is well-developed. She is not diaphoretic.  HENT:     Head: Normocephalic and  atraumatic.  Cardiovascular:     Pulses: Normal pulses.  Pulmonary:     Effort: Pulmonary effort is normal.  Musculoskeletal:        General: Swelling, tenderness and signs of injury present.     Right ankle: She exhibits decreased range of motion and swelling. She exhibits no laceration and normal pulse. Tenderness. Lateral malleolus tenderness found. No head of 5th metatarsal and no proximal fibula tenderness found.  Skin:    General: Skin is warm and dry.     Findings: No erythema or rash.  Neurological:     Mental Status: She is alert and oriented to person, place, and time.  Psychiatric:        Behavior: Behavior normal.      ED Treatments / Results  Labs (all labs ordered are listed, but only abnormal results are displayed) Labs Reviewed - No data to display  EKG None  Radiology Dg Ankle Complete Right  Result Date: 07/31/2019 CLINICAL DATA:  Fall EXAM: RIGHT ANKLE - COMPLETE 3+ VIEW COMPARISON:  None. FINDINGS: Nondisplaced, oblique trans syndesmotic fracture of the distal fibula (Weber B) with circumferential ankle joint swelling and a large ankle joint effusion. The medial and posterior malleoli are intact. No abnormal widening of the ankle mortise. No other fracture or traumatic malalignment is seen. Vascular calcium noted posteriorly. IMPRESSION: 1. Nondisplaced, oblique trans syndesmotic fracture of the distal fibula (Weber B Stage II/III). 2. Large ankle joint effusion and circumferential ankle joint swelling. Electronically Signed   By: Lovena Le M.D.   On: 07/31/2019 22:46    Procedures Procedures (including critical care time)  Medications Ordered in ED Medications - No data to display   Initial Impression / Assessment and Plan / ED Course  I have reviewed the triage vital signs and the nursing notes.  Pertinent labs & imaging results that were available during my care of the patient were reviewed by me and considered in my medical decision making (see  chart for details).  Clinical Course as of Jul 31 2255  Sat Jul 30, 1074  [id number] 85 year old female presents from home with right ankle injury after a mechanical fall at home today.  No other injuries.  On exam patient has swelling with tenderness to her right lateral ankle, no hip or knee injury, no pain in her foot.  Normal dorsalis pedis pulse.  X-ray shows a nondisplaced oblique trans syndesmotic fracture of the distal fibula. Minus to place in cam walker, patient to use a walker while at home, her friend will stay with her tonight.  Elevate the leg, apply ice and follow-up with orthopedics on Monday.   [LM]    Clinical Course User Index [LM] Tacy Learn, PA-C      Final Clinical Impressions(s) / ED Diagnoses   Final diagnoses:  Other closed fracture of distal end of right fibula, initial encounter    ED Discharge Orders    None       Tacy Learn, PA-C 07/31/19 2256    Milton Ferguson, MD 08/01/19 2023

## 2019-07-31 NOTE — ED Notes (Signed)
This nurse w an emergent pt and has only now seen pt   She tripped and fell with ankle pain   reports worse now  Here for eval

## 2019-07-31 NOTE — Discharge Instructions (Addendum)
Wear boot at all times while walking/mobile.  You should use a walker at all times.  Elevate your leg as much as possible, apply ice for 30 minutes at a time. Take Tylenol as needed as directed for pain. Follow-up with orthopedics, call Monday to schedule an appointment.

## 2019-07-31 NOTE — ED Triage Notes (Signed)
Pt fell at home, tripping over her foot, and landing twisting right ankle. Pt has swelling to right ankle, was able to bear weight with cane, but pain has gotten worse. No LOC. Did not hit head.

## 2019-08-02 ENCOUNTER — Telehealth: Payer: Self-pay | Admitting: Orthopedic Surgery

## 2019-08-02 NOTE — Telephone Encounter (Signed)
Patient's daughter/emergency contact on file called; states was the person with patient at time of [facility] room visit 07/31/19, treated for problem (as per chart note):1. Nondisplaced, oblique trans syndesmotic fracture of the distal fibula (Weber B Stage II/III). 2. Large ankle joint effusion and circumferential ankle joint swelling.  Visit summary shows refer to Dr Aline Brochure (not on call per our schedule).  Please review and advise. Ph [phone]

## 2019-08-03 NOTE — Telephone Encounter (Signed)
Friday?

## 2019-08-03 NOTE — Telephone Encounter (Signed)
Called patient/daughter, POA; offered appointment as noted.

## 2019-08-03 NOTE — Telephone Encounter (Signed)
Scheduled accordingly; patient aware.

## 2019-08-06 ENCOUNTER — Ambulatory Visit (INDEPENDENT_AMBULATORY_CARE_PROVIDER_SITE_OTHER): Payer: Medicare HMO | Admitting: Orthopedic Surgery

## 2019-08-06 ENCOUNTER — Other Ambulatory Visit: Payer: Self-pay

## 2019-08-06 VITALS — BP 113/58 | HR 65 | Temp 97.2°F

## 2019-08-06 DIAGNOSIS — S8264XA Nondisplaced fracture of lateral malleolus of right fibula, initial encounter for closed fracture: Secondary | ICD-10-CM | POA: Diagnosis not present

## 2019-08-06 NOTE — Progress Notes (Signed)
Patient ID: Whitney Galloway, female   DOB: 1929/06/29, 85 y.o.   MRN: [id number]  Chief Complaint  Patient presents with  . Ankle Injury    ER follow up on right ankle, DOI 07-31-19.    HPI Whitney Galloway is a 85 y.o. female.  Who presents for evaluation of nondisplaced right fibular fracture  Location pain located lateral malleolus and ankle Duration 6 days Quality dull ache Severity moderate Associated with swelling painful weightbearing  She is also having trouble using the walker she having trouble getting dressed she is having trouble with her ADLs.  She does live alone she has a daughter that helps her but could use some extra help   Review of Systems Review of Systems  HENT: Positive for hearing loss.   All other systems reviewed and are negative.    Past Medical History:  Diagnosis Date  . Bowel incontinence   . Colon polyp 1994 Malignant polyp   2005 NL BE; 2006 incomplete TCS DUE TO REDUNDNACY  . Diverticulosis   . Fibromyalgia   . Gastritis   . Gastroparesis 2009   65% RETENTION AT 2 HOURS  . GERD (gastroesophageal reflux disease)   . Hard of hearing   . Hernia of unspecified site of abdominal cavity without mention of obstruction or gangrene   . Hiatal hernia   . HIATAL HERNIA 06/23/2008   Qualifier: Diagnosis of  By: Craige Cotta    . HTN (hypertension)   . IBS (irritable bowel syndrome)   . Osteoporosis   . S/P endoscopy 2007, 2009   2007: single distal esophageal erosion, s/p 72 F dilation, no web/ring/stricture noted; 2009: normal, Bravo with adequate suppression on Prevacid BID  . Splenic lesion HAMARTOMA  . TB (tuberculosis) AGE 78  . Wears dentures    top  . Wears glasses   . Wears hearing aid     Past Surgical History:  Procedure Laterality Date  . BACK SURGERY  2014   lumbar lam  . Benign tumor removed from bilateral breast    . cataract surgery    . COCHLEAR IMPLANT Left 09/21/2014   Procedure: COCHLEAR IMPLANT LEFT EAR;  Surgeon:  Fannie Knee, MD;  Location: [facility];  Service: ENT;  Laterality: Left;  . COLONOSCOPY W/ POLYPECTOMY    . ESOPHAGOGASTRODUODENOSCOPY  08/27/2011   hiatal hernia/mild gastritis  . ESOPHAGOGASTRODUODENOSCOPY  05/25/2008    Normal esophagus without evidence of Barrett mass/  Normal stomach, duodenal bulb, and second portion of the duodenum  . ESOPHAGOGASTRODUODENOSCOPY  08/11/2006   Single erosion at distal esophagus and a small sliding hiatal hernia/ No evidence of ring or stricture but esophagus was dilated by passing 40 [place] Maloney dilator/ Prepyloric antral erythema but no evidence of erosions or peptic ulcer disease.  . ESOPHAGOGASTRODUODENOSCOPY  09/03/2005   Small sliding hiatal hernia without evidence of ring or  stricture formation. Esophagus dilated by passing 54-French Maloney dilator   given history of dysphagia  . GIVENS CAPSULE STUDY  05/27/2008   Adequate acid suppression with proton pump inhibitor.  [name] LEFT WRIST CYST REMOVAL    . LUNG SURGERY for pulmonary nodules    . Mass removed from roof of mouth    . SAVORY DILATION  08/27/2011   stricture in the distal esophagus  . UPPER GASTROINTESTINAL ENDOSCOPY  2006  . X-STOP IMPLANTATION      No family history on file.  Social History Social History   Tobacco Use  .  Smoking status: Former Smoker    Quit date: 09/16/2004    Years since quitting: 14.8  . Smokeless tobacco: Never Used  . Tobacco comment: Quit x 8 years  Substance Use Topics  . Alcohol use: No  . Drug use: No    No Known Allergies  Current Outpatient Medications  Medication Sig Dispense Refill  . amlodipine-atorvastatin (CADUET) 10-20 MG tablet Take 1 tablet by mouth daily.    . calcium carbonate (TUMS - DOSED IN MG ELEMENTAL CALCIUM) 500 MG chewable tablet Chew 1 tablet by mouth as needed. gas    . dicyclomine (BENTYL) 10 MG capsule 1 po every other night followed by another dose in the morning. YOU MAY USE ONE AT 12 NOON IF  NEEDED. (Patient taking differently: every other day. Takes only as needed) 90 capsule 11  . fexofenadine (ALLEGRA) 180 MG tablet Take 180 mg by mouth daily.    [name] lidocaine (XYLOCAINE) 2 % solution 2 TSP  PO QAC AN DHS IF NEEDED FOR ABDOMINAL OR CHEST PAIN. MAY REPEAT DOSE EVERY 4 HOURS. NO MORE THAN 8 DOSES A DAY. 300 mL 1  . LORazepam (ATIVAN) 1 MG tablet Take 1 mg by mouth at bedtime.     . Multiple Vitamin (MULTIVITAMIN WITH MINERALS) TABS tablet Take 1 tablet by mouth daily.    . Multiple Vitamins-Minerals (PRESERVISION AREDS PO) Take by mouth daily.    . multivitamin-lutein (OCUVITE-LUTEIN) CAPS Take 1 capsule by mouth daily.     . mupirocin ointment (BACTROBAN) 2 % Apply 1 application topically daily as needed.    . naproxen sodium (ALEVE) 220 MG tablet Take 220 mg by mouth 2 (two) times daily as needed (Pain).    [name] omeprazole (PRILOSEC) 20 MG capsule TAKE 1 CAPSULE TWICE A DAY BEFORE MEALS 60 capsule 11  . Polyethyl Glycol-Propyl Glycol (SYSTANE OP) Place 1 drop into both eyes daily.    . polyethylene glycol (MIRALAX / GLYCOLAX) packet Take 17 g by mouth daily as needed.    . Probiotic Product (PROBIOTIC DAILY PO) Take by mouth every other day.    . TOPROL XL 25 MG 24 hr tablet Take 25 mg by mouth 2 (two) times daily.     . Venlafaxine HCl 225 MG TB24 Take 225 mg by mouth daily.      No current facility-administered medications for this visit.        Physical Exam BP (!) 113/58   Pulse 65   Temp (!) 97.2 F (36.2 C)  Physical Exam The patient is well developed well nourished and well groomed.  Orientation to person place and time is normal  Mood is pleasant. Ambulation is difficult walks on her toes in the cam walker   Ortho Exam  Right ankle examination: Inspection reveals tenderness and swelling over the lateral malleolus. The medial malleolus is negative  Range of motion is limited by pain foot is plantigrade. Ankle stability tests were deferred because of pain in the  ankle does not appear to be subluxated. Motor exam shows no atrophy  Skin shows mild bruising and ecchymosis. Neurovascular exam is intact.  Opposite ankle shows no deformity. ROM is normal       MEDICAL DECISION SECTION  Prior outside images have been reviewed and the report is included by reference.  It confirms the fracture.  I have also read the x-ray and give the following personal interpretation of the films:  Nondisplaced linear fibula fracture with some spiral component  Any prior office  visits and ER records have been reviewed and confirmed the diagnosis of ankle fracture   Encounter Diagnosis  Name Primary?  . Closed nondisplaced fracture of lateral malleolus of right fibula, initial encounter Yes     PLAN:  8 weeks in a cam walker  Try to get some home health for gait training ADLs baths and meals  X-ray in 8 weeks weight-bear as tolerated No orders of the defined types were placed in this encounter.

## 2019-08-10 ENCOUNTER — Telehealth: Payer: Self-pay | Admitting: Orthopedic Surgery

## 2019-08-10 DIAGNOSIS — T7840XA Allergy, unspecified, initial encounter: Secondary | ICD-10-CM | POA: Diagnosis not present

## 2019-08-10 DIAGNOSIS — I10 Essential (primary) hypertension: Secondary | ICD-10-CM | POA: Diagnosis not present

## 2019-08-10 DIAGNOSIS — J309 Allergic rhinitis, unspecified: Secondary | ICD-10-CM | POA: Diagnosis not present

## 2019-08-10 DIAGNOSIS — E8809 Other disorders of plasma-protein metabolism, not elsewhere classified: Secondary | ICD-10-CM | POA: Diagnosis not present

## 2019-08-10 DIAGNOSIS — E782 Mixed hyperlipidemia: Secondary | ICD-10-CM | POA: Diagnosis not present

## 2019-08-10 DIAGNOSIS — S8264XA Nondisplaced fracture of lateral malleolus of right fibula, initial encounter for closed fracture: Secondary | ICD-10-CM

## 2019-08-10 NOTE — Telephone Encounter (Signed)
She was ordered for an aide, not physical therapy.  I will ask about physical therapy.  Do you have orders for her for physical therapy?

## 2019-08-10 NOTE — Telephone Encounter (Signed)
Patient's daughter, POA on file, Kelli Churn, called to inquire about her mom starting some physical therapy at home for her ankle - said she had talked with [facility]. Please advise if this would be a recommendation. Ph [phone]

## 2019-08-11 NOTE — Telephone Encounter (Signed)
PT GAIT TRAINING WBAT

## 2019-08-11 NOTE — Telephone Encounter (Signed)
Spoke to daughter, sent order to Tim with [facility]

## 2019-08-13 DIAGNOSIS — K219 Gastro-esophageal reflux disease without esophagitis: Secondary | ICD-10-CM | POA: Diagnosis not present

## 2019-08-13 DIAGNOSIS — M797 Fibromyalgia: Secondary | ICD-10-CM | POA: Diagnosis not present

## 2019-08-13 DIAGNOSIS — I10 Essential (primary) hypertension: Secondary | ICD-10-CM | POA: Diagnosis not present

## 2019-08-13 DIAGNOSIS — Z86010 Personal history of colonic polyps: Secondary | ICD-10-CM | POA: Diagnosis not present

## 2019-08-13 DIAGNOSIS — G5790 Unspecified mononeuropathy of unspecified lower limb: Secondary | ICD-10-CM | POA: Diagnosis not present

## 2019-08-13 DIAGNOSIS — M810 Age-related osteoporosis without current pathological fracture: Secondary | ICD-10-CM | POA: Diagnosis not present

## 2019-08-13 DIAGNOSIS — R69 Illness, unspecified: Secondary | ICD-10-CM | POA: Diagnosis not present

## 2019-08-13 DIAGNOSIS — S8264XD Nondisplaced fracture of lateral malleolus of right fibula, subsequent encounter for closed fracture with routine healing: Secondary | ICD-10-CM | POA: Diagnosis not present

## 2019-08-13 DIAGNOSIS — Z87891 Personal history of nicotine dependence: Secondary | ICD-10-CM | POA: Diagnosis not present

## 2019-08-13 DIAGNOSIS — H9190 Unspecified hearing loss, unspecified ear: Secondary | ICD-10-CM | POA: Diagnosis not present

## 2019-08-16 ENCOUNTER — Telehealth: Payer: Self-pay

## 2019-08-16 NOTE — Telephone Encounter (Signed)
Whitney Galloway w/ [facility] at [facility] called saying they are seeing this patient 1 time a wk for 5 wks. He is asking if there are any weight bearing restrictions.  Please call [phone] and advise

## 2019-08-16 NOTE — Telephone Encounter (Signed)
I called him WBAT and gave verbal order.

## 2019-08-23 DIAGNOSIS — I10 Essential (primary) hypertension: Secondary | ICD-10-CM | POA: Diagnosis not present

## 2019-08-23 DIAGNOSIS — R69 Illness, unspecified: Secondary | ICD-10-CM | POA: Diagnosis not present

## 2019-08-23 DIAGNOSIS — E782 Mixed hyperlipidemia: Secondary | ICD-10-CM | POA: Diagnosis not present

## 2019-08-25 DIAGNOSIS — K219 Gastro-esophageal reflux disease without esophagitis: Secondary | ICD-10-CM | POA: Diagnosis not present

## 2019-08-25 DIAGNOSIS — Z9621 Cochlear implant status: Secondary | ICD-10-CM | POA: Diagnosis not present

## 2019-08-25 DIAGNOSIS — E782 Mixed hyperlipidemia: Secondary | ICD-10-CM | POA: Diagnosis not present

## 2019-08-25 DIAGNOSIS — M84463D Pathological fracture, right fibula, subsequent encounter for fracture with routine healing: Secondary | ICD-10-CM | POA: Diagnosis not present

## 2019-08-25 DIAGNOSIS — J309 Allergic rhinitis, unspecified: Secondary | ICD-10-CM | POA: Diagnosis not present

## 2019-08-25 DIAGNOSIS — R69 Illness, unspecified: Secondary | ICD-10-CM | POA: Diagnosis not present

## 2019-08-25 DIAGNOSIS — I10 Essential (primary) hypertension: Secondary | ICD-10-CM | POA: Diagnosis not present

## 2019-08-25 DIAGNOSIS — K581 Irritable bowel syndrome with constipation: Secondary | ICD-10-CM | POA: Diagnosis not present

## 2019-09-15 DIAGNOSIS — I10 Essential (primary) hypertension: Secondary | ICD-10-CM | POA: Diagnosis not present

## 2019-09-15 DIAGNOSIS — E8809 Other disorders of plasma-protein metabolism, not elsewhere classified: Secondary | ICD-10-CM | POA: Diagnosis not present

## 2019-09-15 DIAGNOSIS — J309 Allergic rhinitis, unspecified: Secondary | ICD-10-CM | POA: Diagnosis not present

## 2019-09-15 DIAGNOSIS — E7849 Other hyperlipidemia: Secondary | ICD-10-CM | POA: Diagnosis not present

## 2019-09-15 DIAGNOSIS — T7840XA Allergy, unspecified, initial encounter: Secondary | ICD-10-CM | POA: Diagnosis not present

## 2019-09-27 DIAGNOSIS — S8264XA Nondisplaced fracture of lateral malleolus of right fibula, initial encounter for closed fracture: Secondary | ICD-10-CM | POA: Insufficient documentation

## 2019-09-29 ENCOUNTER — Ambulatory Visit: Payer: Medicare HMO | Admitting: Orthopedic Surgery

## 2019-09-29 ENCOUNTER — Other Ambulatory Visit: Payer: Self-pay

## 2019-09-29 ENCOUNTER — Ambulatory Visit (INDEPENDENT_AMBULATORY_CARE_PROVIDER_SITE_OTHER): Payer: Medicare HMO

## 2019-09-29 VITALS — BP 126/60 | HR 63 | Temp 96.9°F | Ht 68.0 in | Wt 147.0 lb

## 2019-09-29 DIAGNOSIS — S8264XD Nondisplaced fracture of lateral malleolus of right fibula, subsequent encounter for closed fracture with routine healing: Secondary | ICD-10-CM | POA: Diagnosis not present

## 2019-09-29 NOTE — Progress Notes (Signed)
Fracture care follow-up  8 weeks status post lateral malleolus fracture  Patient reportedly doing well  X-rays show fracture healing ankle mortise is intact  Clinical exam is normal  Patient can remove the boot return to normal activities and normal shoewear  Follow-up as needed

## 2019-09-29 NOTE — Patient Instructions (Signed)
Regular shoes 

## 2019-10-11 DIAGNOSIS — M5416 Radiculopathy, lumbar region: Secondary | ICD-10-CM | POA: Diagnosis not present

## 2019-11-21 ENCOUNTER — Ambulatory Visit: Payer: Medicare HMO

## 2019-11-21 ENCOUNTER — Ambulatory Visit: Payer: Medicare HMO | Attending: Internal Medicine

## 2019-11-21 DIAGNOSIS — Z23 Encounter for immunization: Secondary | ICD-10-CM | POA: Insufficient documentation

## 2019-11-21 NOTE — Progress Notes (Signed)
   Covid-19 Vaccination Clinic  Name:  DIJON SUPPA    MRN: [id number] DOB: [birth year]  11/21/2019  Ms. Pucillo was observed post Covid-19 immunization for 15 minutes without incidence. She was provided with Vaccine Information Sheet and instruction to access the V-Safe system.   Ms. Isenbarger was instructed to call 911 with any severe reactions post vaccine: [name] Difficulty breathing  . Swelling of your face and throat  . A fast heartbeat  . A bad rash all over your body  . Dizziness and weakness    Immunizations Administered    Name Date Dose VIS Date Route   Pfizer COVID-19 Vaccine 11/21/2019 10:44 AM 0.3 mL 09/17/2019 Intramuscular   Manufacturer: [place]   Lot: [id number]   [place]: [id number]

## 2019-12-21 ENCOUNTER — Ambulatory Visit: Payer: Medicare HMO | Attending: Internal Medicine

## 2019-12-21 DIAGNOSIS — Z23 Encounter for immunization: Secondary | ICD-10-CM

## 2019-12-21 NOTE — Progress Notes (Signed)
   Covid-19 Vaccination Clinic  Name:  YANCEY VEITCH    MRN: [id number] DOB: [birth year]  12/21/2019  Ms. Winningham was observed post Covid-19 immunization for 15 minutes without incident. She was provided with Vaccine Information Sheet and instruction to access the V-Safe system.   Ms. Hernandezgarci was instructed to call 911 with any severe reactions post vaccine: [name] Difficulty breathing  . Swelling of face and throat  . A fast heartbeat  . A bad rash all over body  . Dizziness and weakness   Immunizations Administered    Name Date Dose VIS Date Route   Pfizer COVID-19 Vaccine 12/21/2019  1:11 PM 0.3 mL 09/17/2019 Intramuscular   Manufacturer: [place]   Lot: [id number]   [place]: [id number]

## 2020-04-02 ENCOUNTER — Other Ambulatory Visit: Payer: Self-pay

## 2020-04-02 ENCOUNTER — Inpatient Hospital Stay (HOSPITAL_COMMUNITY)
Admission: EM | Admit: 2020-04-02 | Discharge: 2020-04-04 | DRG: 093 | Disposition: A | Discharge status: Home Health | Payer: Medicare HMO | Attending: Internal Medicine | Admitting: Internal Medicine

## 2020-04-02 ENCOUNTER — Emergency Department (HOSPITAL_COMMUNITY): Payer: Medicare HMO

## 2020-04-02 ENCOUNTER — Encounter (HOSPITAL_COMMUNITY): Payer: Self-pay | Admitting: Emergency Medicine

## 2020-04-02 DIAGNOSIS — G92 Toxic encephalopathy: Secondary | ICD-10-CM | POA: Diagnosis not present

## 2020-04-02 DIAGNOSIS — R4182 Altered mental status, unspecified: Secondary | ICD-10-CM | POA: Diagnosis not present

## 2020-04-02 DIAGNOSIS — Y929 Unspecified place or not applicable: Secondary | ICD-10-CM

## 2020-04-02 DIAGNOSIS — F419 Anxiety disorder, unspecified: Secondary | ICD-10-CM | POA: Diagnosis present

## 2020-04-02 DIAGNOSIS — F329 Major depressive disorder, single episode, unspecified: Secondary | ICD-10-CM | POA: Diagnosis present

## 2020-04-02 DIAGNOSIS — E785 Hyperlipidemia, unspecified: Secondary | ICD-10-CM | POA: Diagnosis present

## 2020-04-02 DIAGNOSIS — M797 Fibromyalgia: Secondary | ICD-10-CM | POA: Diagnosis present

## 2020-04-02 DIAGNOSIS — E876 Hypokalemia: Secondary | ICD-10-CM | POA: Diagnosis not present

## 2020-04-02 DIAGNOSIS — K219 Gastro-esophageal reflux disease without esophagitis: Secondary | ICD-10-CM | POA: Diagnosis present

## 2020-04-02 DIAGNOSIS — Z87891 Personal history of nicotine dependence: Secondary | ICD-10-CM

## 2020-04-02 DIAGNOSIS — N390 Urinary tract infection, site not specified: Secondary | ICD-10-CM

## 2020-04-02 DIAGNOSIS — F341 Dysthymic disorder: Secondary | ICD-10-CM | POA: Diagnosis present

## 2020-04-02 DIAGNOSIS — Z79899 Other long term (current) drug therapy: Secondary | ICD-10-CM

## 2020-04-02 DIAGNOSIS — Z9621 Cochlear implant status: Secondary | ICD-10-CM | POA: Diagnosis present

## 2020-04-02 DIAGNOSIS — T368X5A Adverse effect of other systemic antibiotics, initial encounter: Secondary | ICD-10-CM | POA: Diagnosis present

## 2020-04-02 DIAGNOSIS — Z66 Do not resuscitate: Secondary | ICD-10-CM | POA: Diagnosis present

## 2020-04-02 DIAGNOSIS — I10 Essential (primary) hypertension: Secondary | ICD-10-CM | POA: Diagnosis present

## 2020-04-02 DIAGNOSIS — M810 Age-related osteoporosis without current pathological fracture: Secondary | ICD-10-CM | POA: Diagnosis present

## 2020-04-02 DIAGNOSIS — Z20822 Contact with and (suspected) exposure to covid-19: Secondary | ICD-10-CM | POA: Diagnosis present

## 2020-04-02 DIAGNOSIS — G9340 Encephalopathy, unspecified: Secondary | ICD-10-CM | POA: Diagnosis present

## 2020-04-02 DIAGNOSIS — K589 Irritable bowel syndrome without diarrhea: Secondary | ICD-10-CM | POA: Diagnosis present

## 2020-04-02 DIAGNOSIS — J302 Other seasonal allergic rhinitis: Secondary | ICD-10-CM | POA: Diagnosis present

## 2020-04-02 DIAGNOSIS — Z8719 Personal history of other diseases of the digestive system: Secondary | ICD-10-CM

## 2020-04-02 DIAGNOSIS — H9190 Unspecified hearing loss, unspecified ear: Secondary | ICD-10-CM | POA: Diagnosis present

## 2020-04-02 DIAGNOSIS — R531 Weakness: Secondary | ICD-10-CM | POA: Diagnosis present

## 2020-04-02 DIAGNOSIS — K449 Diaphragmatic hernia without obstruction or gangrene: Secondary | ICD-10-CM | POA: Diagnosis present

## 2020-04-02 LAB — COMPREHENSIVE METABOLIC PANEL
ALT: 22 U/L (ref 0–44)
AST: 34 U/L (ref 15–41)
Albumin: 4.1 g/dL (ref 3.5–5.0)
Alkaline Phosphatase: 91 U/L (ref 38–126)
Anion gap: 12 (ref 5–15)
BUN: 13 mg/dL (ref 8–23)
CO2: 23 mmol/L (ref 22–32)
Calcium: 9.1 mg/dL (ref 8.9–10.3)
Chloride: 103 mmol/L (ref 98–111)
Creatinine, Ser: 0.7 mg/dL (ref 0.44–1.00)
GFR calc Af Amer: >60 mL/min (ref >60)
GFR calc non Af Amer: >60 mL/min (ref >60)
Glucose, Bld: 113 mg/dL — ABNORMAL HIGH (ref 70–99)
Potassium: 3.4 mmol/L — ABNORMAL LOW (ref 3.5–5.1)
Sodium: 138 mmol/L (ref 135–145)
Total Bilirubin: 0.6 mg/dL (ref 0.3–1.2)
Total Protein: 6.7 g/dL (ref 6.5–8.1)

## 2020-04-02 LAB — URINALYSIS, ROUTINE W REFLEX MICROSCOPIC
Bilirubin Urine: NEGATIVE
Glucose, UA: NEGATIVE mg/dL
Hgb urine dipstick: NEGATIVE
Ketones, ur: 20 mg/dL — AB
Leukocytes,Ua: NEGATIVE
Nitrite: NEGATIVE
Protein, ur: NEGATIVE mg/dL
Specific Gravity, Urine: 1.014 (ref 1.005–1.030)
pH: 5 (ref 5.0–8.0)

## 2020-04-02 LAB — CBC WITH DIFFERENTIAL/PLATELET
Abs Immature Granulocytes: 0.01 10*3/uL (ref 0.00–0.07)
Basophils Absolute: 0.1 10*3/uL (ref 0.0–0.1)
Basophils Relative: 1 %
Eosinophils Absolute: 0 10*3/uL (ref 0.0–0.5)
Eosinophils Relative: 1 %
HCT: 41 % (ref 36.0–46.0)
Hemoglobin: 13.8 g/dL (ref 12.0–15.0)
Immature Granulocytes: 0 %
Lymphocytes Relative: 26 %
Lymphs Abs: 1.8 10*3/uL (ref 0.7–4.0)
MCH: 29.1 pg (ref 26.0–34.0)
MCHC: 33.7 g/dL (ref 30.0–36.0)
MCV: 86.3 fL (ref 80.0–100.0)
Monocytes Absolute: 0.5 10*3/uL (ref 0.1–1.0)
Monocytes Relative: 7 %
Neutro Abs: 4.6 10*3/uL (ref 1.7–7.7)
Neutrophils Relative %: 65 %
Platelets: 230 10*3/uL (ref 150–400)
RBC: 4.75 MIL/uL (ref 3.87–5.11)
RDW: 12.5 % (ref 11.5–15.5)
WBC: 7 10*3/uL (ref 4.0–10.5)
nRBC: 0 % (ref 0.0–0.2)

## 2020-04-02 LAB — SARS CORONAVIRUS 2 BY RT PCR (HOSPITAL ORDER, PERFORMED IN ~~LOC~~ HOSPITAL LAB): SARS Coronavirus 2: NEGATIVE

## 2020-04-02 MED ORDER — ENOXAPARIN SODIUM 40 MG/0.4ML ~~LOC~~ SOLN
40.0000 mg | SUBCUTANEOUS | Status: DC
  Administered 2020-04-03: 40 mg via SUBCUTANEOUS
  Filled 2020-04-02: qty 0.4

## 2020-04-02 MED ORDER — SODIUM CHLORIDE 0.9 % IV SOLN
1.0000 g | INTRAVENOUS | Status: DC
  Administered 2020-04-03: 1 g via INTRAVENOUS
  Filled 2020-04-02: qty 10

## 2020-04-02 MED ORDER — ACETAMINOPHEN 325 MG PO TABS: 650.0000 mg | ORAL_TABLET | Freq: Four times a day (QID) | ORAL | Status: DC | PRN

## 2020-04-02 MED ORDER — ONDANSETRON HCL 4 MG/2ML IJ SOLN: 4.0000 mg | Freq: Four times a day (QID) | INTRAMUSCULAR | Status: DC | PRN

## 2020-04-02 MED ORDER — ACETAMINOPHEN 650 MG RE SUPP: 650.0000 mg | Freq: Four times a day (QID) | RECTAL | Status: DC | PRN

## 2020-04-02 MED ORDER — SODIUM CHLORIDE 0.9 % IV BOLUS
500.0000 mL | Freq: Once | INTRAVENOUS | Status: AC
  Administered 2020-04-02: 500 mL via INTRAVENOUS

## 2020-04-02 MED ORDER — POLYETHYL GLYCOL-PROPYL GLYCOL 0.4-0.3 % OP GEL
Freq: Every day | OPHTHALMIC | Status: DC
  Filled 2020-04-02: qty 10

## 2020-04-02 MED ORDER — AMLODIPINE-ATORVASTATIN 10-20 MG PO TABS: 1.0000 | ORAL_TABLET | Freq: Every day | ORAL | Status: DC

## 2020-04-02 MED ORDER — PANTOPRAZOLE SODIUM 40 MG PO TBEC
40.0000 mg | DELAYED_RELEASE_TABLET | Freq: Two times a day (BID) | ORAL | Status: DC
  Administered 2020-04-03 – 2020-04-04 (×3): 40 mg via ORAL
  Filled 2020-04-02 (×3): qty 1

## 2020-04-02 MED ORDER — POTASSIUM CHLORIDE IN NACL 40-0.9 MEQ/L-% IV SOLN
INTRAVENOUS | Status: DC
  Filled 2020-04-02 (×4): qty 1000

## 2020-04-02 MED ORDER — LORAZEPAM 2 MG/ML IJ SOLN
1.0000 mg | Freq: Once | INTRAMUSCULAR | Status: AC
  Administered 2020-04-02: 1 mg via INTRAVENOUS
  Filled 2020-04-02: qty 1

## 2020-04-02 MED ORDER — ONDANSETRON HCL 4 MG PO TABS: 4.0000 mg | ORAL_TABLET | Freq: Four times a day (QID) | ORAL | Status: DC | PRN

## 2020-04-02 MED ORDER — SODIUM CHLORIDE 0.9 % IV SOLN
1.0000 g | Freq: Once | INTRAVENOUS | Status: AC
  Administered 2020-04-02: 1 g via INTRAVENOUS
  Filled 2020-04-02: qty 10

## 2020-04-02 MED ORDER — AMLODIPINE BESYLATE 5 MG PO TABS
10.0000 mg | ORAL_TABLET | Freq: Every day | ORAL | Status: DC
  Administered 2020-04-03 – 2020-04-04 (×2): 10 mg via ORAL
  Filled 2020-04-02 (×2): qty 2

## 2020-04-02 MED ORDER — ATORVASTATIN CALCIUM 20 MG PO TABS
20.0000 mg | ORAL_TABLET | Freq: Every day | ORAL | Status: DC
  Administered 2020-04-03 – 2020-04-04 (×2): 20 mg via ORAL
  Filled 2020-04-02 (×2): qty 1

## 2020-04-02 MED ORDER — LORAZEPAM 2 MG/ML IJ SOLN
1.0000 mg | Freq: Four times a day (QID) | INTRAMUSCULAR | Status: DC | PRN
  Administered 2020-04-03 – 2020-04-04 (×5): 1 mg via INTRAVENOUS
  Filled 2020-04-02 (×5): qty 1

## 2020-04-02 NOTE — ED Triage Notes (Signed)
Per [facility] called out for SOB, upon arrival pt 98% on RA but resp 30s. Pt placed on 2L  for confort, resp decreased WNL; pt was diagnosed with UTI thurs and has been on abx; CBG 130 enroute; pt in no acute distress during triage, [phone]

## 2020-04-02 NOTE — Progress Notes (Signed)
Patient extremely hard of hearing with extreme hearing deficits. Daughter took hearing aids home with her. Patient not answering questions clearly nor appropriately to complete her admission. Continue to monitor.

## 2020-04-02 NOTE — ED Provider Notes (Signed)
[facility] EM[facility]GENCY DE[facility]MENT Provider Note   CSN: [id number] Arrival date & time: 04/02/20  6237     History Chief Complaint  Patient presents with  . Shortness of Breath    Whitney Galloway is a 86 y.o. female. Level 5 caveat due to difficulty hearing and some productive confusion. HPI Patient presents with shortness of breath and generalized weakness.  Recently started on antibiotics on Thursday for urinary tract infection.  Reportedly took about 2 days of antibiotics and said that it was bad for her.  Called PCP and got different antibiotics ordered.  This antibiotic is ciprofloxacin although unknown what the first 1 was.  Has been more weak and more confused.  Somewhat decreased oral intake.  Having complained of some abdominal pain.  Reportedly urine culture is not back yet.  Has had shortness of breath for EMS but was not hypoxic.  Patient has cochlear implants difficulty hearing and was very difficult to get history from.    Past Medical History:  Diagnosis Date  . Bowel incontinence   . Colon polyp 1994 Malignant polyp   2005 NL BE; 2006 incomplete TCS DUE TO REDUNDNACY  . Diverticulosis   . Fibromyalgia   . Gastritis   . Gastroparesis 2009   65% RETENTION AT 2 HOURS  . GERD (gastroesophageal reflux disease)   . Hard of hearing   . Hernia of unspecified site of abdominal cavity without mention of obstruction or gangrene   . Hiatal hernia   . HIATAL HERNIA 06/23/2008   Qualifier: Diagnosis of  By: Craige Cotta    . HTN (hypertension)   . IBS (irritable bowel syndrome)   . Osteoporosis   . S/P endoscopy 2007, 2009   2007: single distal esophageal erosion, s/p 2 F dilation, no web/ring/stricture noted; 2009: normal, Bravo with adequate suppression on Prevacid BID  . Splenic lesion HAMARTOMA  . TB (tuberculosis) AGE 84  . Wears dentures    top  . Wears glasses   . Wears hearing aid     Patient Active Problem List   Diagnosis Date Noted  . Closed  nondisplaced fracture of lateral malleolus of right fibula 09/27/2019  . Postop check 09/21/2014  . Breast cancer, 1971, status post right lumpectomy 01/04/2014  . Hemorrhoids, internal, with bleeding 10/14/2013  . Anemia 09/09/2013  . History of spinal surgery 12/17/2012  . Difficulty hearing 12/17/2012  . Herniated nucleus pulposus, L3-4 with stenosis and spondylolisthesis 10/20/2012  . Night sweats 12/26/2011  . Stiffness of joints, not elsewhere classified, multiple sites 12/19/2011  . Weakness of left leg 12/19/2011  . Difficulty in walking(719.7) 12/19/2011  . GASTROPARESIS 11/03/2009  . COLONIC POLYPS, ADENOMATOUS 06/23/2008  . UNSPECIFIED DISEASE OF SPLEEN 06/23/2008  . DEPRESSION/ANXIETY 06/23/2008  . HYPERTENSION 06/23/2008  . ALLERGIC RHINITIS, SEASONAL 06/23/2008  . GASTROESOPHAGEAL REFLUX DISEASE, CHRONIC 06/23/2008  . DIVERTICULOSIS OF COLON 06/23/2008  . Irritable bowel syndrome 06/23/2008  . ECZEMA 06/23/2008  . OSTEOARTHRITIS, SPINE 06/23/2008  . DEGENERATIVE DISC DISEASE 06/23/2008  . OTHER DYSPHAGIA 06/23/2008  . Incontinence of feces 06/23/2008  . TOBACCO USE, QUIT 06/23/2008    Past Surgical History:  Procedure Laterality Date  . BACK SURGERY  2014   lumbar lam  . Benign tumor removed from bilateral breast    . cataract surgery    . COCHLEAR IMPLANT Left 09/21/2014   Procedure: COCHLEAR IMPLANT LEFT EAR;  Surgeon: Fannie Knee, MD;  Location: [facility];  Service: ENT;  Laterality: Left;  .  COLONOSCOPY W/ POLYPECTOMY    . ESOPHAGOGASTRODUODENOSCOPY  08/27/2011   hiatal hernia/mild gastritis  . ESOPHAGOGASTRODUODENOSCOPY  05/25/2008    Normal esophagus without evidence of Barrett mass/  Normal stomach, duodenal bulb, and second portion of the duodenum  . ESOPHAGOGASTRODUODENOSCOPY  08/11/2006   Single erosion at distal esophagus and a small sliding hiatal hernia/ No evidence of ring or stricture but esophagus was dilated by passing 9  [place] Maloney dilator/ Prepyloric antral erythema but no evidence of erosions or peptic ulcer disease.  . ESOPHAGOGASTRODUODENOSCOPY  09/03/2005   Small sliding hiatal hernia without evidence of ring or  stricture formation. Esophagus dilated by passing 54-French Maloney dilator   given history of dysphagia  . GIVENS CAPSULE STUDY  05/27/2008   Adequate acid suppression with proton pump inhibitor.  [name] LEFT WRIST CYST REMOVAL    . LUNG SURGERY for pulmonary nodules    . Mass removed from roof of mouth    . SAVORY DILATION  08/27/2011   stricture in the distal esophagus  . UPPER GASTROINTESTINAL ENDOSCOPY  2006  . X-STOP IMPLANTATION       OB History   No obstetric history on file.     No family history on file.  Social History   Tobacco Use  . Smoking status: Former Smoker    Quit date: 09/16/2004    Years since quitting: 15.5  . Smokeless tobacco: Never Used  . Tobacco comment: Quit x 8 years  Substance Use Topics  . Alcohol use: No  . Drug use: No    Home Medications Prior to Admission medications   Medication Sig Start Date End Date Taking? Authorizing Provider  amlodipine-atorvastatin (CADUET) 10-20 MG tablet Take 1 tablet by mouth daily.    [provider]  calcium carbonate (TUMS - DOSED IN MG ELEMENTAL CALCIUM) 500 MG chewable tablet Chew 1 tablet by mouth as needed. gas    [provider]  dicyclomine (BENTYL) 10 MG capsule 1 po every other night followed by another dose in the morning. YOU MAY USE ONE AT 12 NOON IF NEEDED. Patient taking differently: every other day. Takes only as needed 06/19/16   Mahala Menghini, PA-C  fexofenadine (ALLEGRA) 180 MG tablet Take 180 mg by mouth daily.    [provider]  lidocaine (XYLOCAINE) 2 % solution 2 TSP  PO QAC AN DHS IF NEEDED FOR ABDOMINAL OR CHEST PAIN. MAY REPEAT DOSE EVERY 4 HOURS. NO MORE THAN 8 DOSES A DAY. 06/05/17   Fields, Sandi L, MD  LORazepam (ATIVAN) 1 MG tablet Take 1 mg by mouth at  bedtime.     [provider]  Multiple Vitamin (MULTIVITAMIN WITH MINERALS) TABS tablet Take 1 tablet by mouth daily.    [provider]  Multiple Vitamins-Minerals (PRESERVISION AREDS PO) Take by mouth daily.    [provider]  multivitamin-lutein (OCUVITE-LUTEIN) CAPS Take 1 capsule by mouth daily.     [provider]  mupirocin ointment (BACTROBAN) 2 % Apply 1 application topically daily as needed. 07/12/15   [provider]  naproxen sodium (ALEVE) 220 MG tablet Take 220 mg by mouth 2 (two) times daily as needed (Pain).    [provider]  omeprazole (PRILOSEC) 20 MG capsule TAKE 1 CAPSULE TWICE A DAY BEFORE MEALS 05/03/19   Mahala Menghini, PA-C  Polyethyl Glycol-Propyl Glycol (SYSTANE OP) Place 1 drop into both eyes daily.    [provider]  polyethylene glycol (MIRALAX / GLYCOLAX) packet Take 17  g by mouth daily as needed.    [provider]  Probiotic Product (PROBIOTIC DAILY PO) Take by mouth every other day.    [provider]  TOPROL XL 25 MG 24 hr tablet Take 25 mg by mouth 2 (two) times daily.  07/18/15   [provider]  Venlafaxine HCl 225 MG TB24 Take 225 mg by mouth daily.     [provider]    Allergies    Patient has no known allergies.  Review of Systems   Review of Systems  Unable to perform ROS: Mental status change    Physical Exam Updated Vital Signs BP (!) 119/58   Pulse 61   Temp (!) 97.5 F (36.4 C) (Oral)   Resp 20   Ht 5\' 5"  (1.651 m)   Wt 64.7 kg   SpO2 97%   BMI 23.73 kg/m   Physical Exam  ED Results / Procedures / Treatments   Labs (all labs ordered are listed, but only abnormal results are displayed) Labs Reviewed  COMPREHENSIVE METABOLIC PANEL - Abnormal; Notable for the following components:      Result Value   Potassium 3.4 (*)    Glucose, Bld 113 (*)    All other components within normal limits  URINE CULTURE  CBC WITH  DIFFERENTIAL/PLATELET  URINALYSIS, ROUTINE W REFLEX MICROSCOPIC    EKG EKG Interpretation  Date/Time:  Sunday April 02 2020 08:36:07 EDT Ventricular Rate:  63 PR Interval:    QRS Duration: 97 QT Interval:  427 QTC Calculation: 438 R Axis:   41 Text Interpretation: Sinus rhythm Minimal ST depression, anterolateral leads No significant change since last tracing Confirmed by Davonna Belling [phone]) on 04/02/2020 10:10:56 AM   Radiology DG Chest Portable 1 View  Result Date: 04/02/2020 CLINICAL DATA:  Shortness of breath. EXAM: PORTABLE CHEST 1 VIEW COMPARISON:  December 29, 2016 FINDINGS: Postsurgical changes on the right with scarring and suture line. The heart, hila, mediastinum, lungs, and pleura are unremarkable. IMPRESSION: No acute abnormalities.  Postsurgical changes on the right. Electronically Signed   By: Dorise Bullion III M.D   On: 04/02/2020 10:43    Procedures Procedures (including critical care time)  Medications Ordered in ED Medications  cefTRIAXone (ROCEPHIN) 1 g in sodium chloride 0.9 % 100 mL IVPB (has no administration in time range)  sodium chloride 0.9 % bolus 500 mL (500 mLs Intravenous New Bag/Given 04/02/20 1224)  LORazepam (ATIVAN) injection 1 mg (1 mg Intravenous Given 04/02/20 1235)    ED Course  I have reviewed the triage vital signs and the nursing notes.  Pertinent labs & imaging results that were available during my care of the patient were reviewed by me and considered in my medical decision making (see chart for details).    MDM Rules/Calculators/A&P                          Patient brought in for weakness shortness of breath. Diagnosed with UTI a couple days ago. Did not tolerate antibiotics and mental status is changed. Reportedly had urinalysis that was positive and urine culture pending from PCP but I do not have access to this at this time. Patient does have mental status change. I feel it if she would benefit from admission to the hospital.  Urinalysis still pending here but should be able to follow-up in outpatient culture also. Will discuss with hospitalist Final Clinical Impression(s) / ED Diagnoses Final diagnoses:  Urinary tract  infection without hematuria, site unspecified    Rx / DC Orders ED Discharge Orders    None       Davonna Belling, MD 04/02/20 1429

## 2020-04-02 NOTE — H&P (Signed)
History and Physical    Whitney Galloway [facility] ZHG:[id number] DOB: 1929/01/22 DOA: 04/02/2020  PCP: Whitney Squibb, MD  Patient coming from: home  I have personally briefly reviewed patient's old medical records in [place]  Chief Complaint: dysuria, anxiety  HPI: Whitney Galloway is a 86 y.o. female with medical history significant of hypertension, hyperlipidemia, GERD, who normally lives independently, is brought to the hospital with dysuria, possible change in mental status.  Patient is currently sedated and unable to provide any significant history.  History is obtained from patient's daughter.  It is reported that patient had been expressing possible dysuria and redness in her genital area for the past few days.  She had seen her primary care physician on Thursday and was prescribed an antibiotic.  It is unclear which antibiotic was initially prescribed, but she took 1 dose and continued to feel unwell.  She feels generally weak.  Her primary care physician was called back in a different antibiotic was prescribed which she took for the last 2 days.  Unfortunately, she felt her symptoms were getting worse and so she has to be brought to the hospital.  There was some reports that she complained of shortness of breath to EMS, but daughter reports that she did not note any worsening shortness of breath.  Patient was a chronic smoker and has had episodes of cough and shortness of breath intermittently, her current respiratory status is no different than normal per daughter.  ED Course: She is afebrile in the emergency room and vitals are otherwise unremarkable.  Basic labs are unrevealing.  Urinalysis and urine culture have been ordered.  Review of Systems: As per HPI otherwise 10 point review of systems negative.    Past Medical History:  Diagnosis Date  . Bowel incontinence   . Colon polyp 1994 Malignant polyp   2005 NL BE; 2006 incomplete TCS DUE TO REDUNDNACY  . Diverticulosis   .  Fibromyalgia   . Gastritis   . Gastroparesis 2009   65% RETENTION AT 2 HOURS  . GERD (gastroesophageal reflux disease)   . Hard of hearing   . Hernia of unspecified site of abdominal cavity without mention of obstruction or gangrene   . Hiatal hernia   . HIATAL HERNIA 06/23/2008   Qualifier: Diagnosis of  By: Craige Cotta    . HTN (hypertension)   . IBS (irritable bowel syndrome)   . Osteoporosis   . S/P endoscopy 2007, 2009   2007: single distal esophageal erosion, s/p 47 F dilation, no web/ring/stricture noted; 2009: normal, Bravo with adequate suppression on Prevacid BID  . Splenic lesion HAMARTOMA  . TB (tuberculosis) AGE 63  . Wears dentures    top  . Wears glasses   . Wears hearing aid     Past Surgical History:  Procedure Laterality Date  . BACK SURGERY  2014   lumbar lam  . Benign tumor removed from bilateral breast    . cataract surgery    . COCHLEAR IMPLANT Left 09/21/2014   Procedure: COCHLEAR IMPLANT LEFT EAR;  Surgeon: Fannie Knee, MD;  Location: [facility];  Service: ENT;  Laterality: Left;  . COLONOSCOPY W/ POLYPECTOMY    . ESOPHAGOGASTRODUODENOSCOPY  08/27/2011   hiatal hernia/mild gastritis  . ESOPHAGOGASTRODUODENOSCOPY  05/25/2008    Normal esophagus without evidence of Barrett mass/  Normal stomach, duodenal bulb, and second portion of the duodenum  . ESOPHAGOGASTRODUODENOSCOPY  08/11/2006   Single erosion at distal esophagus and  a small sliding hiatal hernia/ No evidence of ring or stricture but esophagus was dilated by passing 51 [place] Maloney dilator/ Prepyloric antral erythema but no evidence of erosions or peptic ulcer disease.  . ESOPHAGOGASTRODUODENOSCOPY  09/03/2005   Small sliding hiatal hernia without evidence of ring or  stricture formation. Esophagus dilated by passing 54-French Maloney dilator   given history of dysphagia  . GIVENS CAPSULE STUDY  05/27/2008   Adequate acid suppression with proton pump inhibitor.  [name] LEFT WRIST  CYST REMOVAL    . LUNG SURGERY for pulmonary nodules    . Mass removed from roof of mouth    . SAVORY DILATION  08/27/2011   stricture in the distal esophagus  . UPPER GASTROINTESTINAL ENDOSCOPY  2006  . X-STOP IMPLANTATION      Social History:  reports that she quit smoking about 15 years ago. She has never used smokeless tobacco. She reports that she does not drink alcohol and does not use drugs.  No Known Allergies  Family history: Family history reviewed and not pertinent  Prior to Admission medications   Medication Sig Start Date End Date Taking? Authorizing Provider  albuterol (VENTOLIN HFA) 108 (90 Base) MCG/ACT inhaler Inhale into the lungs.   Yes [provider]  amlodipine-atorvastatin (CADUET) 10-20 MG tablet Take 1 tablet by mouth daily.   Yes [provider]  calcium carbonate (TUMS - DOSED IN MG ELEMENTAL CALCIUM) 500 MG chewable tablet Chew 1 tablet by mouth as needed. gas   Yes [provider]  ciprofloxacin (CIPRO) 250 MG tablet Take 250 mg by mouth 2 (two) times daily. 03/31/20  Yes [provider]  dicyclomine (BENTYL) 10 MG capsule 1 po every other night followed by another dose in the morning. YOU MAY USE ONE AT 12 NOON IF NEEDED. Patient taking differently: Take 10 mg by mouth every other day. Takes only as needed 06/19/16  Yes Mahala Menghini, PA-C  fexofenadine (ALLEGRA) 180 MG tablet Take 180 mg by mouth daily.   Yes [provider]  LORazepam (ATIVAN) 1 MG tablet Take 1 mg by mouth at bedtime.    Yes [provider]  Multiple Vitamin (MULTIVITAMIN WITH MINERALS) TABS tablet Take 1 tablet by mouth daily.   Yes [provider]  Multiple Vitamins-Minerals (PRESERVISION AREDS PO) Take by mouth daily.   Yes [provider]  omeprazole (PRILOSEC) 20 MG capsule TAKE 1 CAPSULE TWICE A DAY BEFORE MEALS Patient taking differently: Take 20 mg by mouth 2 (two) times daily before a meal.  05/03/19  Yes  Mahala Menghini, PA-C  Polyethyl Glycol-Propyl Glycol (SYSTANE OP) Place 1 drop into both eyes daily.   Yes [provider]  Probiotic Product (PROBIOTIC DAILY PO) Take by mouth every other day.   Yes [provider]  TOPROL XL 25 MG 24 hr tablet Take 25 mg by mouth 2 (two) times daily.  07/18/15  Yes [provider]  Venlafaxine HCl 225 MG TB24 Take 225 mg by mouth daily.    Yes [provider]  carboxymethylcellulose (REFRESH PLUS) 0.5 % SOLN 1 drop. Each eye 2 times a day Patient not taking: Reported on 04/02/2020    [provider]  lidocaine (XYLOCAINE) 2 % solution 2 TSP  PO QAC AN DHS IF NEEDED FOR ABDOMINAL OR CHEST PAIN. MAY REPEAT DOSE EVERY 4 HOURS. NO MORE THAN 8 DOSES A DAY. Patient not taking: Reported on 04/02/2020 06/05/17   Danie Binder, MD  metoprolol  tartrate (LOPRESSOR) 25 MG tablet Take 25 mg by mouth 2 (two) times daily. Patient not taking: Reported on 04/02/2020 03/16/20   [provider]  multivitamin-lutein (OCUVITE-LUTEIN) CAPS Take 1 capsule by mouth daily.  Patient not taking: Reported on 04/02/2020    [provider]  mupirocin ointment (BACTROBAN) 2 % Apply 1 application topically daily as needed. Patient not taking: Reported on 04/02/2020 07/12/15   [provider]  naproxen sodium (ALEVE) 220 MG tablet Take 220 mg by mouth 2 (two) times daily as needed (Pain). Patient not taking: Reported on 04/02/2020    [provider]  polyethylene glycol (MIRALAX / GLYCOLAX) packet Take 17 g by mouth daily as needed.    [provider]    Physical Exam: Vitals:   04/02/20 1259 04/02/20 1402 04/02/20 1630 04/02/20 1830  BP: (!) 145/64 (!) 119/58 (!) 104/50 (!) 122/58  Pulse: 63 61 70 74  Resp: 19 20 20 17   Temp:      TempSrc:      SpO2: 94% 97% 96% 94%  Weight:      Height:        Constitutional: Somnolent, no distress Eyes: PERRL, lids and conjunctivae normal ENMT: Mucous  membranes are moist. Posterior pharynx clear of any exudate or lesions.Normal dentition.  Neck: normal, supple, no masses, no thyromegaly Respiratory: clear to auscultation bilaterally, no wheezing, no crackles. Normal respiratory effort. No accessory muscle use.  Cardiovascular: Regular rate and rhythm, no murmurs / rubs / gallops. No extremity edema. 2+ pedal pulses. No carotid bruits.  Abdomen: no tenderness, no masses palpated. No hepatosplenomegaly. Bowel sounds positive.  Musculoskeletal: no clubbing / cyanosis. No joint deformity upper and lower extremities. Good ROM, no contractures. Normal muscle tone.  Skin: no rashes, lesions, ulcers. No induration Neurologic: Limited exam due to mental status.  No reported gross deficits Psychiatric: Somnolent   Labs on Admission: I have personally reviewed following labs and imaging studies  CBC: Recent Labs  Lab 04/02/20 1204  WBC 7.0  NEUTROABS 4.6  HGB 13.8  HCT 41.0  MCV 86.3  PLT 627   Basic Metabolic Panel: Recent Labs  Lab 04/02/20 1204  NA 138  K 3.4*  CL 103  CO2 23  GLUCOSE 113*  BUN 13  CREATININE 0.70  CALCIUM 9.1   GFR: Estimated Creatinine Clearance: 42.9 mL/min (by C-G formula based on SCr of 0.7 mg/dL). Liver Function Tests: Recent Labs  Lab 04/02/20 1204  AST 34  ALT 22  ALKPHOS 91  BILITOT 0.6  PROT 6.7  ALBUMIN 4.1   No results for input(s): LIPASE, AMYLASE in the last 168 hours. No results for input(s): AMMONIA in the last 168 hours. Coagulation Profile: No results for input(s): INR, PROTIME in the last 168 hours. Cardiac Enzymes: No results for input(s): CKTOTAL, CKMB, CKMBINDEX, TROPONINI in the last 168 hours. BNP (last 3 results) No results for input(s): PROBNP in the last 8760 hours. HbA1C: No results for input(s): HGBA1C in the last 72 hours. CBG: No results for input(s): GLUCAP in the last 168 hours. Lipid Profile: No results for input(s): CHOL, HDL, LDLCALC, TRIG, CHOLHDL,  LDLDIRECT in the last 72 hours. Thyroid Function Tests: No results for input(s): TSH, T4TOTAL, FREET4, T3FREE, THYROIDAB in the last 72 hours. Anemia Panel: No results for input(s): VITAMINB12, FOLATE, FERRITIN, TIBC, IRON, RETICCTPCT in the last 72 hours. Urine analysis:    Component Value Date/Time   COLORURINE YELLOW 07/29/2015 2049   APPEARANCEUR CLEAR 07/29/2015 2049  LABSPEC 1.015 07/29/2015 2049   PHURINE 7.0 07/29/2015 2049   GLUCOSEU NEGATIVE 07/29/2015 2049   HGBUR NEGATIVE 07/29/2015 2049   BILIRUBINUR SMALL (A) 07/29/2015 2049   KETONESUR 15 (A) 07/29/2015 2049   PROTEINUR NEGATIVE 07/29/2015 2049   UROBILINOGEN 0.2 07/29/2015 2049   NITRITE NEGATIVE 07/29/2015 2049   LEUKOCYTESUR MODERATE (A) 07/29/2015 2049    Radiological Exams on Admission: DG Chest Portable 1 View  Result Date: 04/02/2020 CLINICAL DATA:  Shortness of breath. EXAM: PORTABLE CHEST 1 VIEW COMPARISON:  December 29, 2016 FINDINGS: Postsurgical changes on the right with scarring and suture line. The heart, hila, mediastinum, lungs, and pleura are unremarkable. IMPRESSION: No acute abnormalities.  Postsurgical changes on the right. Electronically Signed   By: Dorise Bullion III M.D   On: 04/02/2020 10:43    EKG: Independently reviewed.  Sinus rhythm without acute changes  Assessment/Plan Active Problems:   DEPRESSION/ANXIETY   Essential hypertension   Altered mental status   Acute lower UTI   Hypokalemia     1. Urinary tract infection.  Patient was taking oral antibiotics as an outpatient, but symptoms have persisted.  She was complaining of dysuria and had worsening agitation.  She was started on Rocephin.  Repeat urine studies have been ordered.  We will also plan on following up studies performed by her primary care physician to see if the urine culture has had any growth. 2. Depression/anxiety.  Patient was increasingly anxious/agitated in the emergency room, necessitating Ativan.  We will  continue to monitor mental status 3. Hypokalemia.  Replace 4. Hypertension.  Continue on amlodipine.  Holding metoprolol since blood pressures are marginal. 5. Hyperlipidemia.  Continue statin  DVT prophylaxis: Lovenox Code Status: DNR Family Communication: Discussed with daughter at the bedside Disposition Plan: Discharge home in a.m. if improved mental status Consults called:   Admission status: Observation, MedSurg  Kathie Dike MD [facility]   If 7PM-7AM, please contact night-coverage [email or web address]   04/02/2020, 6:33 PM

## 2020-04-02 NOTE — Progress Notes (Addendum)
Bladder scan resulted more than 450 in bladder. Patient assisted to [facility] and successfully urinated. Urine culture and urinalysis collected.

## 2020-04-02 NOTE — ED Notes (Signed)
Pure wick place on patient. Tolerating well. No urine output noted since arrival to ED. Brief dry. Daughter at bedside

## 2020-04-03 ENCOUNTER — Inpatient Hospital Stay (HOSPITAL_COMMUNITY): Payer: Medicare HMO

## 2020-04-03 DIAGNOSIS — Z87891 Personal history of nicotine dependence: Secondary | ICD-10-CM | POA: Diagnosis not present

## 2020-04-03 DIAGNOSIS — Z66 Do not resuscitate: Secondary | ICD-10-CM | POA: Diagnosis present

## 2020-04-03 DIAGNOSIS — G92 Toxic encephalopathy: Secondary | ICD-10-CM | POA: Diagnosis present

## 2020-04-03 DIAGNOSIS — Y929 Unspecified place or not applicable: Secondary | ICD-10-CM | POA: Diagnosis not present

## 2020-04-03 DIAGNOSIS — R4182 Altered mental status, unspecified: Secondary | ICD-10-CM | POA: Diagnosis present

## 2020-04-03 DIAGNOSIS — E876 Hypokalemia: Secondary | ICD-10-CM | POA: Diagnosis present

## 2020-04-03 DIAGNOSIS — H9190 Unspecified hearing loss, unspecified ear: Secondary | ICD-10-CM | POA: Diagnosis present

## 2020-04-03 DIAGNOSIS — I10 Essential (primary) hypertension: Secondary | ICD-10-CM | POA: Diagnosis present

## 2020-04-03 DIAGNOSIS — F419 Anxiety disorder, unspecified: Secondary | ICD-10-CM | POA: Diagnosis present

## 2020-04-03 DIAGNOSIS — Z9621 Cochlear implant status: Secondary | ICD-10-CM | POA: Diagnosis present

## 2020-04-03 DIAGNOSIS — Z79899 Other long term (current) drug therapy: Secondary | ICD-10-CM | POA: Diagnosis not present

## 2020-04-03 DIAGNOSIS — T368X5A Adverse effect of other systemic antibiotics, initial encounter: Secondary | ICD-10-CM | POA: Diagnosis present

## 2020-04-03 DIAGNOSIS — E785 Hyperlipidemia, unspecified: Secondary | ICD-10-CM | POA: Diagnosis present

## 2020-04-03 DIAGNOSIS — Z20822 Contact with and (suspected) exposure to covid-19: Secondary | ICD-10-CM | POA: Diagnosis present

## 2020-04-03 DIAGNOSIS — K589 Irritable bowel syndrome without diarrhea: Secondary | ICD-10-CM | POA: Diagnosis present

## 2020-04-03 DIAGNOSIS — F329 Major depressive disorder, single episode, unspecified: Secondary | ICD-10-CM | POA: Diagnosis present

## 2020-04-03 DIAGNOSIS — M797 Fibromyalgia: Secondary | ICD-10-CM | POA: Diagnosis present

## 2020-04-03 DIAGNOSIS — M810 Age-related osteoporosis without current pathological fracture: Secondary | ICD-10-CM | POA: Diagnosis present

## 2020-04-03 DIAGNOSIS — J302 Other seasonal allergic rhinitis: Secondary | ICD-10-CM | POA: Diagnosis present

## 2020-04-03 DIAGNOSIS — K449 Diaphragmatic hernia without obstruction or gangrene: Secondary | ICD-10-CM | POA: Diagnosis present

## 2020-04-03 DIAGNOSIS — Z8719 Personal history of other diseases of the digestive system: Secondary | ICD-10-CM | POA: Diagnosis not present

## 2020-04-03 DIAGNOSIS — G9340 Encephalopathy, unspecified: Secondary | ICD-10-CM | POA: Diagnosis present

## 2020-04-03 DIAGNOSIS — K219 Gastro-esophageal reflux disease without esophagitis: Secondary | ICD-10-CM | POA: Diagnosis present

## 2020-04-03 DIAGNOSIS — R531 Weakness: Secondary | ICD-10-CM | POA: Diagnosis present

## 2020-04-03 LAB — CBC
HCT: 40 % (ref 36.0–46.0)
Hemoglobin: 12.8 g/dL (ref 12.0–15.0)
MCH: 28.6 pg (ref 26.0–34.0)
MCHC: 32 g/dL (ref 30.0–36.0)
MCV: 89.3 fL (ref 80.0–100.0)
Platelets: 192 10*3/uL (ref 150–400)
RBC: 4.48 MIL/uL (ref 3.87–5.11)
RDW: 12.7 % (ref 11.5–15.5)
WBC: 6 10*3/uL (ref 4.0–10.5)
nRBC: 0 % (ref 0.0–0.2)

## 2020-04-03 LAB — BASIC METABOLIC PANEL
Anion gap: 10 (ref 5–15)
BUN: 13 mg/dL (ref 8–23)
CO2: 24 mmol/L (ref 22–32)
Calcium: 8.6 mg/dL — ABNORMAL LOW (ref 8.9–10.3)
Chloride: 107 mmol/L (ref 98–111)
Creatinine, Ser: 0.62 mg/dL (ref 0.44–1.00)
GFR calc Af Amer: >60 mL/min (ref >60)
GFR calc non Af Amer: >60 mL/min (ref >60)
Glucose, Bld: 86 mg/dL (ref 70–99)
Potassium: 4.2 mmol/L (ref 3.5–5.1)
Sodium: 141 mmol/L (ref 135–145)

## 2020-04-03 LAB — TSH: TSH: 0.804 u[IU]/mL (ref 0.350–4.500)

## 2020-04-03 LAB — VITAMIN B12: Vitamin B-12: 927 pg/mL — ABNORMAL HIGH (ref 180–914)

## 2020-04-03 MED ORDER — POLYVINYL ALCOHOL 1.4 % OP SOLN
1.0000 [drp] | Freq: Every day | OPHTHALMIC | Status: DC
  Administered 2020-04-03 – 2020-04-04 (×2): 1 [drp] via OPHTHALMIC
  Filled 2020-04-03: qty 15

## 2020-04-03 MED ORDER — LACTATED RINGERS IV SOLN: INTRAVENOUS | Status: DC

## 2020-04-03 MED ORDER — ALUM & MAG HYDROXIDE-SIMETH 200-200-20 MG/5ML PO SUSP
30.0000 mL | Freq: Four times a day (QID) | ORAL | Status: DC | PRN
  Administered 2020-04-03: 30 mL via ORAL
  Filled 2020-04-03: qty 30

## 2020-04-03 NOTE — Consult Note (Signed)
Whitney A. Merlene Laughter, MD     [email or web address]          Whitney Galloway is an 86 y.o. female.   ASSESSMENT/PLAN: 1.  Toxic metabolic encephalopathy possibly multifactorial including medication effect and UTI.  The patient examination does not suggest focal neurological deficits.  No indication that patient has serotonin syndrome or neuroleptic malignant syndrome.  Supportive care is recommended.  Imaging will be obtained.  Additional labs will also be obtained.    The patient is a 86 year old Y female who lives independently.  She is high functioning and does her ADLs and is cognitively normal at baseline.  She developed abdominal complaints and was treated with antibiotics for UTI.  She was placed on additional medications but had some side effects from this and this was subsequently discontinued.  She was placed on another medication but continued to have abdominal pains and other symptoms.  She became restless and irritated which resulted in the patient being taken to the hospital by her daughter.  The patient is on venlafaxine and has been on this medication for many years because of affective symptoms.  It turns out she was off his medicine for the last couple of days and the daughter has concerned that this could be contributing to her agitation.  She has been quite restless and agitated in the hospital.  Before I examined her she was given sedatives and is significantly stuporous.  Daughter reports that she may have had irritability and restlessness in the past week and she is off the Effexor but it appears that her symptoms predated the patient not being on the Effexor.  She has severe hearing impairment and communicates mostly by writing and cueing.  The review of system is very limited but otherwise unrevealing.    GENERAL: The patient is quite stuporous but in no acute distress.  HEENT: No trauma noted the neck is supple.  ABDOMEN: soft  EXTREMITIES: No  edema   BACK: Normal  SKIN: Normal by inspection.    MENTAL STATUS: She lays in bed with eyes closed.  She requires sternal rub for the patient to awaken.  She does offer easily and does not follow commands.  No verbal output is also observed.  CRANIAL NERVES: Pupils are equal, round and reactive to light; extra ocular movements are full, there is no significant nystagmus; visual fields are full; upper and lower facial muscles are normal in strength and symmetric, there is no flattening of the nasolabial folds.  MOTOR: She moves both sides well and vigorously without focal deficits.  Bulk and tone are normal.  COORDINATION: No myoclonus, tremor, negative myoclonus or parkinsonian features are noted.  REFLEXES: Deep tendon reflexes are symmetrical and normal.  Plantar responses are flexor bilaterally  SENSATION: Normal to pain.      Blood pressure (!) 122/46, pulse 89, temperature 98.5 F (36.9 C), temperature source Oral, resp. rate 18, height 5\' 5"  (1.651 m), weight 63.7 kg, SpO2 97 %.  Past Medical History:  Diagnosis Date  . Bowel incontinence   . Colon polyp 1994 Malignant polyp   2005 NL BE; 2006 incomplete TCS DUE TO REDUNDNACY  . Diverticulosis   . Fibromyalgia   . Gastritis   . Gastroparesis 2009   65% RETENTION AT 2 HOURS  . GERD (gastroesophageal reflux disease)   . Hard of hearing   . Hernia of unspecified site of abdominal cavity without mention of obstruction or gangrene   . Hiatal hernia   .  HIATAL HERNIA 06/23/2008   Qualifier: Diagnosis of  By: Craige Cotta    . HTN (hypertension)   . IBS (irritable bowel syndrome)   . Osteoporosis   . S/P endoscopy 2007, 2009   2007: single distal esophageal erosion, s/p 36 F dilation, no web/ring/stricture noted; 2009: normal, Bravo with adequate suppression on Prevacid BID  . Splenic lesion HAMARTOMA  . TB (tuberculosis) AGE 79  . Wears dentures    top  . Wears glasses   . Wears hearing aid     Past Surgical  History:  Procedure Laterality Date  . BACK SURGERY  2014   lumbar lam  . Benign tumor removed from bilateral breast    . cataract surgery    . COCHLEAR IMPLANT Left 09/21/2014   Procedure: COCHLEAR IMPLANT LEFT EAR;  Surgeon: Fannie Knee, MD;  Location: [facility];  Service: ENT;  Laterality: Left;  . COLONOSCOPY W/ POLYPECTOMY    . ESOPHAGOGASTRODUODENOSCOPY  08/27/2011   hiatal hernia/mild gastritis  . ESOPHAGOGASTRODUODENOSCOPY  05/25/2008    Normal esophagus without evidence of Barrett mass/  Normal stomach, duodenal bulb, and second portion of the duodenum  . ESOPHAGOGASTRODUODENOSCOPY  08/11/2006   Single erosion at distal esophagus and a small sliding hiatal hernia/ No evidence of ring or stricture but esophagus was dilated by passing 43 [place] Maloney dilator/ Prepyloric antral erythema but no evidence of erosions or peptic ulcer disease.  . ESOPHAGOGASTRODUODENOSCOPY  09/03/2005   Small sliding hiatal hernia without evidence of ring or  stricture formation. Esophagus dilated by passing 54-French Maloney dilator   given history of dysphagia  . GIVENS CAPSULE STUDY  05/27/2008   Adequate acid suppression with proton pump inhibitor.  [name] LEFT WRIST CYST REMOVAL    . LUNG SURGERY for pulmonary nodules    . Mass removed from roof of mouth    . SAVORY DILATION  08/27/2011   stricture in the distal esophagus  . UPPER GASTROINTESTINAL ENDOSCOPY  2006  . X-STOP IMPLANTATION      History reviewed. No pertinent family history.  Social History:  reports that she quit smoking about 15 years ago. She has never used smokeless tobacco. She reports that she does not drink alcohol and does not use drugs.  Allergies: No Known Allergies  Medications: Prior to Admission medications   Medication Sig Start Date End Date Taking? Authorizing Provider  albuterol (VENTOLIN HFA) 108 (90 Base) MCG/ACT inhaler Inhale into the lungs.   Yes [provider]    amlodipine-atorvastatin (CADUET) 10-20 MG tablet Take 1 tablet by mouth daily.   Yes [provider]  calcium carbonate (TUMS - DOSED IN MG ELEMENTAL CALCIUM) 500 MG chewable tablet Chew 1 tablet by mouth as needed. gas   Yes [provider]  ciprofloxacin (CIPRO) 250 MG tablet Take 250 mg by mouth 2 (two) times daily. 03/31/20  Yes [provider]  dicyclomine (BENTYL) 10 MG capsule 1 po every other night followed by another dose in the morning. YOU MAY USE ONE AT 12 NOON IF NEEDED. Patient taking differently: Take 10 mg by mouth every other day. Takes only as needed 06/19/16  Yes Mahala Menghini, PA-C  fexofenadine (ALLEGRA) 180 MG tablet Take 180 mg by mouth daily.   Yes [provider]  LORazepam (ATIVAN) 1 MG tablet Take 1 mg by mouth at bedtime.    Yes [provider]  Multiple Vitamin (MULTIVITAMIN WITH MINERALS) TABS tablet Take 1 tablet by mouth daily.  Yes [provider]  Multiple Vitamins-Minerals (PRESERVISION AREDS PO) Take by mouth daily.   Yes [provider]  omeprazole (PRILOSEC) 20 MG capsule TAKE 1 CAPSULE TWICE A DAY BEFORE MEALS Patient taking differently: Take 20 mg by mouth 2 (two) times daily before a meal.  05/03/19  Yes Mahala Menghini, PA-C  Polyethyl Glycol-Propyl Glycol (SYSTANE OP) Place 1 drop into both eyes daily.   Yes [provider]  Probiotic Product (PROBIOTIC DAILY PO) Take by mouth every other day.   Yes [provider]  TOPROL XL 25 MG 24 hr tablet Take 25 mg by mouth 2 (two) times daily.  07/18/15  Yes [provider]  Venlafaxine HCl 225 MG TB24 Take 225 mg by mouth daily.    Yes [provider]  carboxymethylcellulose (REFRESH PLUS) 0.5 % SOLN 1 drop. Each eye 2 times a day Patient not taking: Reported on 04/02/2020    [provider]  lidocaine (XYLOCAINE) 2 % solution 2 TSP  PO QAC AN DHS IF NEEDED FOR ABDOMINAL OR CHEST PAIN. MAY REPEAT DOSE EVERY  4 HOURS. NO MORE THAN 8 DOSES A DAY. Patient not taking: Reported on 04/02/2020 06/05/17   Danie Binder, MD  metoprolol tartrate (LOPRESSOR) 25 MG tablet Take 25 mg by mouth 2 (two) times daily. Patient not taking: Reported on 04/02/2020 03/16/20   [provider]  multivitamin-lutein (OCUVITE-LUTEIN) CAPS Take 1 capsule by mouth daily.  Patient not taking: Reported on 04/02/2020    [provider]  mupirocin ointment (BACTROBAN) 2 % Apply 1 application topically daily as needed. Patient not taking: Reported on 04/02/2020 07/12/15   [provider]  naproxen sodium (ALEVE) 220 MG tablet Take 220 mg by mouth 2 (two) times daily as needed (Pain). Patient not taking: Reported on 04/02/2020    [provider]  polyethylene glycol (MIRALAX / GLYCOLAX) packet Take 17 g by mouth daily as needed.    [provider]    Scheduled Meds: . amLODipine  10 mg Oral Daily  . atorvastatin  20 mg Oral Daily  . enoxaparin (LOVENOX) injection  40 mg Subcutaneous Q24H  . pantoprazole  40 mg Oral BID AC  . polyvinyl alcohol  1 drop Both Eyes Daily   Continuous Infusions: . lactated ringers     PRN Meds:.acetaminophen **OR** acetaminophen, alum & mag hydroxide-simeth, LORazepam, ondansetron **OR** ondansetron (ZOFRAN) IV     Results for orders placed or performed during the hospital encounter of 04/02/20 (from the past 48 hour(s))  Comprehensive metabolic panel     Status: Abnormal   Collection Time: 04/02/20 12:04 PM  Result Value Ref Range   Sodium 138 135 - 145 mmol/L   Potassium 3.4 (L) 3.5 - 5.1 mmol/L   Chloride 103 98 - 111 mmol/L   CO2 23 22 - 32 mmol/L   Glucose, Bld 113 (H) 70 - 99 mg/dL    Comment: Glucose reference range applies only to samples taken after fasting for at least 8 hours.   BUN 13 8 - 23 mg/dL   Creatinine, Ser 0.70 0.44 - 1.00 mg/dL   Calcium 9.1 8.9 - 10.3 mg/dL   Total Protein 6.7 6.5 - 8.1 g/dL   Albumin 4.1 3.5 - 5.0 g/dL    AST 34 15 - 41 U/L   ALT 22 0 - 44 U/L   Alkaline Phosphatase 91 38 - 126 U/L   Total Bilirubin 0.6 0.3 - 1.2 mg/dL   GFR calc non  Af Amer >60 >60 mL/min   GFR calc Af Amer >60 >60 mL/min   Anion gap 12 5 - 15    Comment: Performed at [facility], [address], [place], [place] [address]  CBC with Differential     Status: None   Collection Time: 04/02/20 12:04 PM  Result Value Ref Range   WBC 7.0 4.0 - 10.5 K/uL   RBC 4.75 3.87 - 5.11 MIL/uL   Hemoglobin 13.8 12.0 - 15.0 g/dL   HCT 41.0 36 - 46 %   MCV 86.3 80.0 - 100.0 fL   MCH 29.1 26.0 - 34.0 pg   MCHC 33.7 30.0 - 36.0 g/dL   RDW 12.5 11.5 - 15.5 %   Platelets 230 150 - 400 K/uL   nRBC 0.0 0.0 - 0.2 %   Neutrophils Relative % 65 %   Neutro Abs 4.6 1.7 - 7.7 K/uL   Lymphocytes Relative 26 %   Lymphs Abs 1.8 0.7 - 4.0 K/uL   Monocytes Relative 7 %   Monocytes Absolute 0.5 0 - 1 K/uL   Eosinophils Relative 1 %   Eosinophils Absolute 0.0 0 - 0 K/uL   Basophils Relative 1 %   Basophils Absolute 0.1 0 - 0 K/uL   Immature Granulocytes 0 %   Abs Immature Granulocytes 0.01 0.00 - 0.07 K/uL    Comment: Performed at [facility], [address] [place], [place] [address]  SARS Coronavirus 2 by RT PCR (hospital order, performed in [facility] hospital lab) Nasopharyngeal Nasopharyngeal Swab     Status: None   Collection Time: 04/02/20  5:21 PM   Specimen: Nasopharyngeal Swab  Result Value Ref Range   SARS Coronavirus 2 NEGATIVE NEGATIVE    Comment: (NOTE) SARS-CoV-2 target nucleic acids are NOT DETECTED.  The SARS-CoV-2 RNA is generally detectable in upper and lower respiratory specimens during the acute phase of infection. The lowest concentration of SARS-CoV-2 viral copies this assay can detect is 250 copies / mL. A negative result does not preclude SARS-CoV-2 infection and should not be used as the sole basis for treatment or other patient management decisions.  A negative result may occur with improper specimen collection  / handling, submission of specimen other than nasopharyngeal swab, presence of viral mutation(s) within the areas targeted by this assay, and inadequate number of viral copies (<250 copies / mL). A negative result must be combined with clinical observations, patient history, and epidemiological information.  Fact Sheet for Patients:   StrictlyIdeas.no  Fact Sheet for Healthcare Providers: BankingDealers.co.za  This test is not yet approved or  cleared by the [place] FDA and has been authorized for detection and/or diagnosis of SARS-CoV-2 by FDA under an Emergency Use Authorization (EUA).  This EUA will remain in effect (meaning this test can be used) for the duration of the COVID-19 declaration under Section 564(b)(1) of the Act, 21 U.S.C. section 360bbb-3(b)(1), unless the authorization is terminated or revoked sooner.  Performed at [facility], [address] [place], [place] [address]   Urinalysis, Routine w reflex microscopic     Status: Abnormal   Collection Time: 04/02/20  9:45 PM  Result Value Ref Range   Color, Urine YELLOW YELLOW   APPearance CLEAR CLEAR   Specific Gravity, Urine 1.014 1.005 - 1.030   pH 5.0 5.0 - 8.0   Glucose, UA NEGATIVE NEGATIVE mg/dL   Hgb urine dipstick NEGATIVE NEGATIVE   Bilirubin Urine NEGATIVE NEGATIVE   Ketones, ur 20 (A) NEGATIVE mg/dL  Protein, ur NEGATIVE NEGATIVE mg/dL   Nitrite NEGATIVE NEGATIVE   Leukocytes,Ua NEGATIVE NEGATIVE    Comment: Performed at [facility], [address] [place], [place] [address]  Basic metabolic panel     Status: Abnormal   Collection Time: 04/03/20  4:05 AM  Result Value Ref Range   Sodium 141 135 - 145 mmol/L   Potassium 4.2 3.5 - 5.1 mmol/L    Comment: DELTA CHECK NOTED   Chloride 107 98 - 111 mmol/L   CO2 24 22 - 32 mmol/L   Glucose, Bld 86 70 - 99 mg/dL    Comment: Glucose reference range applies only to samples taken after fasting for at  least 8 hours.   BUN 13 8 - 23 mg/dL   Creatinine, Ser 0.62 0.44 - 1.00 mg/dL   Calcium 8.6 (L) 8.9 - 10.3 mg/dL   GFR calc non Af Amer >60 >60 mL/min   GFR calc Af Amer >60 >60 mL/min   Anion gap 10 5 - 15    Comment: Performed at [facility], [address], [place], [place] [address]  CBC     Status: None   Collection Time: 04/03/20  4:05 AM  Result Value Ref Range   WBC 6.0 4.0 - 10.5 K/uL   RBC 4.48 3.87 - 5.11 MIL/uL   Hemoglobin 12.8 12.0 - 15.0 g/dL   HCT 40.0 36 - 46 %   MCV 89.3 80.0 - 100.0 fL   MCH 28.6 26.0 - 34.0 pg   MCHC 32.0 30.0 - 36.0 g/dL   RDW 12.7 11.5 - 15.5 %   Platelets 192 150 - 400 K/uL   nRBC 0.0 0.0 - 0.2 %    Comment: Performed at [facility], [address], [place], [place] [address]    Studies/Results:     Sallie Maker A. Merlene Galloway, M.D.  Diplomate, Tax adviser of Psychiatry and Neurology ( Neurology). 04/03/2020, 7:11 PM

## 2020-04-03 NOTE — Progress Notes (Signed)
PROGRESS NOTE    Whitney Galloway [facility]  LKJ:[id number] DOB: 29-Jul-1929 DOA: 04/02/2020 PCP: Celene Squibb, MD    Brief Narrative:  86 year old female with a history of hypertension, hyperlipidemia, who normally lives independently, initially began to have symptoms of dysuria.  She had gone to her primary care physician and was started on a course of ciprofloxacin.  She subsequently began to have increasing confusion/agitation and was brought to the hospital for evaluation.  Upon arrival, she was noted to be tremulous, confused, repeatedly trying to get out of bed.  It was felt that she may have presents with urinary tract infection and was started on Rocephin.  Urine culture from primary care physician has shown no growth.  She is not having any fever or leukocytosis.  It is possible that she may have an adverse effect of medications (fluoroquinolones versus serotonin syndrome).  Neurology consulted.   Assessment & Plan:   Active Problems:   DEPRESSION/ANXIETY   Essential hypertension   Altered mental status   Hypokalemia   Acute encephalopathy   1. Acute encephalopathy.  Etiology is not clear.  Initially felt to be due to urinary tract infection, although urine cultures have shown no growth.  Urine culture results from primary care physician have also been reviewed that showed no growth.  She was recently started on ciprofloxacin after which her mental status reportedly got worse.  This could be related to fluoroquinolones.  She is also taking venlafaxine, and is tremulous with increased tone and altered mental status.  Question if she has an element of serotonin syndrome.  Will avoid neuroleptics.  Consult neurology for further input. 2. Depression/anxiety.  Continue on Ativan as needed.  She is chronically on venlafaxine which is currently on hold. 3. Hypokalemia.  Replace 4. Hypertension.  Chronically on amlodipine.  Blood pressures have been stable. 5. Hyperlipidemia.  Continue  statin 6. Generalized weakness.  Seen by physical therapy with recommendations for home health.   DVT prophylaxis: enoxaparin (LOVENOX) injection 40 mg Start: 04/02/20 1845  Code Status: DNR Family Communication: discussed with daughter at the bedside Disposition Plan: Status is: Inpatient  Remains inpatient appropriate because:Ongoing diagnostic testing needed not appropriate for outpatient work up   Dispo: The patient is from: Home              Anticipated d/c is to: TBD              Anticipated d/c date is: 2 days              Patient currently is not medically stable to d/c.    Consultants:     Procedures:     Antimicrobials:   Ceftriaxone 6/27>6/28    Subjective: Remains confused, agitated, trying to get out of bed  Objective: Vitals:   04/03/20 0037 04/03/20 0437 04/03/20 0847 04/03/20 1532  BP: 121/60 (!) 120/47 (!) 128/48 (!) 122/46  Pulse: 88 73 79 89  Resp: 18 18 19 18   Temp: 98.7 F (37.1 C) 97.8 F (36.6 C) 98.1 F (36.7 C) 98.5 F (36.9 C)  TempSrc: Oral Oral Oral Oral  SpO2: 99% 94% 97% 97%  Weight:      Height:        Intake/Output Summary (Last 24 hours) at 04/03/2020 1843 Last data filed at 04/03/2020 0600 Gross per 24 hour  Intake 685.13 ml  Output 400 ml  Net 285.13 ml   Filed Weights   04/02/20 0838 04/02/20 2048  Weight: 64.7 kg 63.7 kg  Examination:  General exam: agitated, constantly moving around in bed Respiratory system: Clear to auscultation. Respiratory effort normal. Cardiovascular system: S1 & S2 heard, tachycardic. No JVD, murmurs, rubs, gallops or clicks. No pedal edema. Gastrointestinal system: Abdomen is nondistended, soft and nontender. No organomegaly or masses felt. Normal bowel sounds heard. Central nervous system: tremulous, increased tone in extremities Extremities: Symmetric 5 x 5 power. Skin: No rashes, lesions or ulcers Psychiatry: confused, agitated    Data Reviewed: I have personally reviewed  following labs and imaging studies  CBC: Recent Labs  Lab 04/02/20 1204 04/03/20 0405  WBC 7.0 6.0  NEUTROABS 4.6  --   HGB 13.8 12.8  HCT 41.0 40.0  MCV 86.3 89.3  PLT 230 229   Basic Metabolic Panel: Recent Labs  Lab 04/02/20 1204 04/03/20 0405  NA 138 141  K 3.4* 4.2  CL 103 107  CO2 23 24  GLUCOSE 113* 86  BUN 13 13  CREATININE 0.70 0.62  CALCIUM 9.1 8.6*   GFR: Estimated Creatinine Clearance: 42.9 mL/min (by C-G formula based on SCr of 0.62 mg/dL). Liver Function Tests: Recent Labs  Lab 04/02/20 1204  AST 34  ALT 22  ALKPHOS 91  BILITOT 0.6  PROT 6.7  ALBUMIN 4.1   No results for input(s): LIPASE, AMYLASE in the last 168 hours. No results for input(s): AMMONIA in the last 168 hours. Coagulation Profile: No results for input(s): INR, PROTIME in the last 168 hours. Cardiac Enzymes: No results for input(s): CKTOTAL, CKMB, CKMBINDEX, TROPONINI in the last 168 hours. BNP (last 3 results) No results for input(s): PROBNP in the last 8760 hours. HbA1C: No results for input(s): HGBA1C in the last 72 hours. CBG: No results for input(s): GLUCAP in the last 168 hours. Lipid Profile: No results for input(s): CHOL, HDL, LDLCALC, TRIG, CHOLHDL, LDLDIRECT in the last 72 hours. Thyroid Function Tests: No results for input(s): TSH, T4TOTAL, FREET4, T3FREE, THYROIDAB in the last 72 hours. Anemia Panel: No results for input(s): VITAMINB12, FOLATE, FERRITIN, TIBC, IRON, RETICCTPCT in the last 72 hours. Sepsis Labs: No results for input(s): PROCALCITON, LATICACIDVEN in the last 168 hours.  Recent Results (from the past 240 hour(s))  SARS Coronavirus 2 by RT PCR (hospital order, performed in [facility] hospital lab) Nasopharyngeal Nasopharyngeal Swab     Status: None   Collection Time: 04/02/20  5:21 PM   Specimen: Nasopharyngeal Swab  Result Value Ref Range Status   SARS Coronavirus 2 NEGATIVE NEGATIVE Final    Comment: (NOTE) SARS-CoV-2 target nucleic acids are  NOT DETECTED.  The SARS-CoV-2 RNA is generally detectable in upper and lower respiratory specimens during the acute phase of infection. The lowest concentration of SARS-CoV-2 viral copies this assay can detect is 250 copies / mL. A negative result does not preclude SARS-CoV-2 infection and should not be used as the sole basis for treatment or other patient management decisions.  A negative result may occur with improper specimen collection / handling, submission of specimen other than nasopharyngeal swab, presence of viral mutation(s) within the areas targeted by this assay, and inadequate number of viral copies (<250 copies / mL). A negative result must be combined with clinical observations, patient history, and epidemiological information.  Fact Sheet for Patients:   StrictlyIdeas.no  Fact Sheet for Healthcare Providers: BankingDealers.co.za  This test is not yet approved or  cleared by the [place] FDA and has been authorized for detection and/or diagnosis of SARS-CoV-2 by FDA under an Emergency Use Authorization (EUA).  This EUA will remain in effect (meaning this test can be used) for the duration of the COVID-19 declaration under Section 564(b)(1) of the Act, 21 U.S.C. section 360bbb-3(b)(1), unless the authorization is terminated or revoked sooner.  Performed at [facility], [address] [place], [place] [address]          Radiology Studies: DG Chest Portable 1 View  Result Date: 04/02/2020 CLINICAL DATA:  Shortness of breath. EXAM: PORTABLE CHEST 1 VIEW COMPARISON:  December 29, 2016 FINDINGS: Postsurgical changes on the right with scarring and suture line. The heart, hila, mediastinum, lungs, and pleura are unremarkable. IMPRESSION: No acute abnormalities.  Postsurgical changes on the right. Electronically Signed   By: Dorise Bullion III M.D   On: 04/02/2020 10:43        Scheduled Meds: . amLODipine  10 mg  Oral Daily  . atorvastatin  20 mg Oral Daily  . enoxaparin (LOVENOX) injection  40 mg Subcutaneous Q24H  . pantoprazole  40 mg Oral BID AC  . polyvinyl alcohol  1 drop Both Eyes Daily   Continuous Infusions: . 0.9 % NaCl with KCl 40 mEq / L 75 mL/hr at 04/02/20 2100     LOS: 0 days    Time spent: 32mins    Kathie Dike, MD [facility]   If 7PM-7AM, please contact night-coverage [email or web address]  04/03/2020, 6:43 PM

## 2020-04-03 NOTE — Care Management Obs Status (Signed)
[place] NOTIFICATION   Patient Details  Name: Whitney Galloway MRN: [id number] Date of Birth: [birth year]   Medicare Observation Status Notification Given:  Yes    Tommy Medal 04/03/2020, 3:41 PM

## 2020-04-03 NOTE — TOC Initial Note (Signed)
Transition of Care [facility]) - Initial/Assessment Note    Patient Details  Name: Whitney Galloway MRN: [id number] Date of Birth: [birth year]  Transition of Care [facility]) CM/SW Contact:    Ihor Gully, LCSW Phone Number: 04/03/2020, 12:22 PM  Clinical Narrative:                 From home, independent at baseline. Daughter is support. PT recommendation of HHPT. Daughter agreeable to HHPT. [place] agency accepting patient's insurance today is [facility].    Expected Discharge Plan: [place] Barriers to Discharge: No Barriers Identified   Patient Goals and CMS Choice        Expected Discharge Plan and Services Expected Discharge Plan: [place]: PT [facility] Agency: [place] ([place]) Date [place]: 04/03/20 Time [place]: 1221 Representative spoke with at [place]: [place] Arrangements/Services   Lives with:: Self Patient language and need for interpreter reviewed:: Yes Do you feel safe going back to the place where you live?: Yes      Need for Family Participation in Patient Care: Yes (Comment) Care giver support system in place?: Yes (comment)   Criminal Activity/Legal Involvement Pertinent to Current Situation/Hospitalization: No - Comment as needed  Activities of Daily Living Home Assistive Devices/Equipment: Dentures (specify type), Blood pressure cuff, Eyeglasses, Walker (specify type), Cane (specify quad or straight) ADL Screening (condition at time of admission) Patient's cognitive ability adequate to safely complete daily activities?: Yes Is the patient deaf or have difficulty hearing?: Yes Does the patient have difficulty seeing, even when wearing glasses/contacts?: No Does the patient have difficulty concentrating, remembering, or making decisions?: Yes Patient able to express need for assistance with ADLs?: Yes Does the patient have  difficulty dressing or bathing?: Yes Independently performs ADLs?: No Communication: Independent Dressing (OT): Needs assistance Is this a change from baseline?: Change from baseline, expected to last <3days Grooming: Needs assistance Is this a change from baseline?: Change from baseline, expected to last <3 days Feeding: Independent Bathing: Needs assistance Is this a change from baseline?: Change from baseline, expected to last <3 days Toileting: Needs assistance Is this a change from baseline?: Change from baseline, expected to last <3 days In/Out Bed: Needs assistance Is this a change from baseline?: Change from baseline, expected to last <3 days Walks in Home: Needs assistance (use of cane or walker) Is this a change from baseline?: Pre-admission baseline Does the patient have difficulty walking or climbing stairs?: Yes Weakness of Legs: Both Weakness of Arms/Hands: Both  Permission Sought/Granted Permission sought to share information with : Family Supports    Share Information with NAME: Olin Hauser     Permission granted to share info w Relationship: daughter     Emotional Assessment       Orientation: : Oriented to Self, Oriented to  Time, Oriented to Place, Oriented to Situation Alcohol / Substance Use: Not Applicable Psych Involvement: No (comment)  Admission diagnosis:  Altered mental status [R41.82] Urinary tract infection without hematuria, site unspecified [N39.0] Patient Active Problem List   Diagnosis Date Noted  . Altered mental status 04/02/2020  . Acute lower UTI 04/02/2020  . Hypokalemia 04/02/2020  . Closed nondisplaced fracture of lateral malleolus of right fibula 09/27/2019  .  Postop check 09/21/2014  . Breast cancer, 1971, status post right lumpectomy 01/04/2014  . Hemorrhoids, internal, with bleeding 10/14/2013  . Anemia 09/09/2013  . History of spinal surgery 12/17/2012  . Difficulty hearing 12/17/2012  . Herniated nucleus pulposus, L3-4 with  stenosis and spondylolisthesis 10/20/2012  . Night sweats 12/26/2011  . Stiffness of joints, not elsewhere classified, multiple sites 12/19/2011  . Weakness of left leg 12/19/2011  . Difficulty in walking(719.7) 12/19/2011  . GASTROPARESIS 11/03/2009  . COLONIC POLYPS, ADENOMATOUS 06/23/2008  . UNSPECIFIED DISEASE OF SPLEEN 06/23/2008  . DEPRESSION/ANXIETY 06/23/2008  . Essential hypertension 06/23/2008  . ALLERGIC RHINITIS, SEASONAL 06/23/2008  . GASTROESOPHAGEAL REFLUX DISEASE, CHRONIC 06/23/2008  . DIVERTICULOSIS OF COLON 06/23/2008  . Irritable bowel syndrome 06/23/2008  . ECZEMA 06/23/2008  . OSTEOARTHRITIS, SPINE 06/23/2008  . DEGENERATIVE DISC DISEASE 06/23/2008  . OTHER DYSPHAGIA 06/23/2008  . Incontinence of feces 06/23/2008  . TOBACCO USE, QUIT 06/23/2008   PCP:  Celene Squibb, MD Pharmacy:   [facility] - [place], [place] [address] Phone: [phone] Fax: [phone]  [facility] [address], [place] - [place] [address] [place] [address] [place] [address] Phone: [phone] Fax: [phone]  [facility] [place], [place] [address] [place] [address] Phone: [phone] Fax: [phone]     Social Determinants of Health (SDOH) Interventions    Readmission Risk Interventions No flowsheet data found.

## 2020-04-03 NOTE — Care Management Important Message (Deleted)
Important Message  Patient Details  Name: Whitney Galloway MRN: [id number] Date of Birth: 10-Jul-1929   Medicare Important Message Given:  Yes     Tommy Medal 04/03/2020, 3:40 PM

## 2020-04-03 NOTE — Plan of Care (Signed)
  Problem: Acute Rehab PT Goals(only PT should resolve) Goal: Patient Will Transfer Sit To/From Stand Outcome: Progressing Flowsheets (Taken 04/03/2020 0955) Patient will transfer sit to/from stand:  with modified independence  with supervision Goal: Pt Will Transfer Bed To Chair/Chair To Bed Outcome: Progressing Flowsheets (Taken 04/03/2020 0955) Pt will Transfer Bed to Chair/Chair to Bed:  with modified independence  with supervision Goal: Pt Will Ambulate Outcome: Progressing Flowsheets (Taken 04/03/2020 0955) Pt will Ambulate:  100 feet  with supervision  with rolling walker   9:56 AM, 04/03/20 Lonell Grandchild, MPT Physical Therapist with [facility] [phone] office [phone] mobile phone

## 2020-04-03 NOTE — Evaluation (Signed)
Physical Therapy Evaluation Patient Details Name: Whitney Galloway MRN: [id number] DOB: 1929/04/29 Today's Date: 04/03/2020   History of Present Illness  Whitney Galloway is a 86 y.o. female with medical history significant of hypertension, hyperlipidemia, GERD, who normally lives independently, is brought to the hospital with dysuria, possible change in mental status.  Patient is currently sedated and unable to provide any significant history.  History is obtained from patient's daughter.  It is reported that patient had been expressing possible dysuria and redness in her genital area for the past few days.  She had seen her primary care physician on Thursday and was prescribed an antibiotic.  It is unclear which antibiotic was initially prescribed, but she took 1 dose and continued to feel unwell.  She feels generally weak.  Her primary care physician was called back in a different antibiotic was prescribed which she took for the last 2 days.  Unfortunately, she felt her symptoms were getting worse and so she has to be brought to the hospital.  There was some reports that she complained of shortness of breath to EMS, but daughter reports that she did not note any worsening shortness of breath.  Patient was a chronic smoker and has had episodes of cough and shortness of breath intermittently, her current respiratory status is no different than normal per daughter.    Clinical Impression  Patient very HOH and required repeated verbal/tactile cueing to follow instructions, demonstrates good return for sitting up at bedside, unsteady on feet requiring use of RW for ambulation in room/hallway without loss of balance and tolerated sitting up at side of bed to eat breakfast after therapy - RN/NT notified.  Patient will benefit from continued physical therapy in hospital and recommended venue below to increase strength, balance, endurance for safe ADLs and gait.    Follow Up Recommendations Home health  PT;Supervision for mobility/OOB;Supervision - Intermittent    Equipment Recommendations  Rolling walker with 5" wheels    Recommendations for Other Services       Precautions / Restrictions Precautions Precautions: Fall Restrictions Weight Bearing Restrictions: No      Mobility  Bed Mobility Overal bed mobility: Modified Independent                Transfers Overall transfer level: Needs assistance Equipment used: None Transfers: Sit to/from Stand;Stand Pivot Transfers Sit to Stand: Supervision Stand pivot transfers: Min guard       General transfer comment: slightly labored movement  Ambulation/Gait Ambulation/Gait assistance: Min guard Gait Distance (Feet): 75 Feet Assistive device: Rolling walker (2 wheeled) Gait Pattern/deviations: Decreased step length - right;Decreased step length - left;Decreased stride length;Trunk flexed Gait velocity: decreased   General Gait Details: slightly labored cadence without loss of balance, limited secondary to fatigue  Stairs            Wheelchair Mobility    Modified Rankin (Stroke Patients Only)       Balance Overall balance assessment: Needs assistance Sitting-balance support: Feet supported;No upper extremity supported Sitting balance-Leahy Scale: Good Sitting balance - Comments: seated at EOB   Standing balance support: During functional activity;No upper extremity supported Standing balance-Leahy Scale: Poor Standing balance comment: fair/poor without AD, fair using RW                             Pertinent Vitals/Pain Pain Assessment: No/denies pain    Home Living Family/patient expects to be discharged to:: Private residence Living  Arrangements: Alone Available Help at Discharge: Family;Available PRN/intermittently             Additional Comments: Patient very [place] and unable to obtain information    Prior Function Level of Independence: Independent with assistive device(s)          Comments: household ambulator     Hand Dominance   Dominant Hand: Right    Extremity/Trunk Assessment   Upper Extremity Assessment Upper Extremity Assessment: Overall WFL for tasks assessed    Lower Extremity Assessment Lower Extremity Assessment: Generalized weakness    Cervical / Trunk Assessment Cervical / Trunk Assessment: Kyphotic  Communication   Communication: HOH  Cognition Arousal/Alertness: Awake/alert Behavior During Therapy: WFL for tasks assessed/performed Overall Cognitive Status: Within Functional Limits for tasks assessed                                 General Comments: required tactile and repeated verbal cueing to follow directions due to very [facility]      General Comments      Exercises     Assessment/Plan    PT Assessment Patient needs continued PT services  PT Problem List Decreased strength;Decreased activity tolerance;Decreased balance;Decreased mobility       PT Treatment Interventions Balance training;Gait training;Stair training;Functional mobility training;Therapeutic activities;Therapeutic exercise;Patient/family education    PT Goals (Current goals can be found in the Care Plan section)  Acute Rehab PT Goals Patient Stated Goal: return home with family to assist PT Goal Formulation: With patient Time For Goal Achievement: 04/10/20 Potential to Achieve Goals: Good    Frequency Min 3X/week   Barriers to discharge        Co-evaluation               AM-PAC PT "6 Clicks" Mobility  Outcome Measure Help needed turning from your back to your side while in a flat bed without using bedrails?: None Help needed moving from lying on your back to sitting on the side of a flat bed without using bedrails?: None Help needed moving to and from a bed to a chair (including a wheelchair)?: A Little Help needed standing up from a chair using your arms (e.g., wheelchair or bedside chair)?: A Little Help needed to  walk in hospital room?: A Little Help needed climbing 3-5 steps with a railing? : A Little 6 Click Score: 20    End of Session   Activity Tolerance: Patient tolerated treatment well;Patient limited by fatigue Patient left: in bed;with bed alarm set (seated at bedside to eat breakfast) Nurse Communication: Mobility status PT Visit Diagnosis: Unsteadiness on feet (R26.81);Other abnormalities of gait and mobility (R26.89);Muscle weakness (generalized) (M62.81)    Time: 3646-8032 PT Time Calculation (min) (ACUTE ONLY): 23 min   Charges:   PT Evaluation $PT Eval Moderate Complexity: 1 Mod PT Treatments $Therapeutic Activity: 23-37 mins        9:54 AM, 04/03/20 Lonell Grandchild, MPT Physical Therapist with [facility] [phone] office [phone] mobile phone

## 2020-04-04 ENCOUNTER — Inpatient Hospital Stay (HOSPITAL_COMMUNITY): Payer: Medicare HMO

## 2020-04-04 LAB — COMPREHENSIVE METABOLIC PANEL
ALT: 18 U/L (ref 0–44)
AST: 31 U/L (ref 15–41)
Albumin: 3.6 g/dL (ref 3.5–5.0)
Alkaline Phosphatase: 80 U/L (ref 38–126)
Anion gap: 9 (ref 5–15)
BUN: 8 mg/dL (ref 8–23)
CO2: 25 mmol/L (ref 22–32)
Calcium: 8.8 mg/dL — ABNORMAL LOW (ref 8.9–10.3)
Chloride: 107 mmol/L (ref 98–111)
Creatinine, Ser: 0.53 mg/dL (ref 0.44–1.00)
GFR calc Af Amer: >60 mL/min (ref >60)
GFR calc non Af Amer: >60 mL/min (ref >60)
Glucose, Bld: 103 mg/dL — ABNORMAL HIGH (ref 70–99)
Potassium: 4 mmol/L (ref 3.5–5.1)
Sodium: 141 mmol/L (ref 135–145)
Total Bilirubin: 0.7 mg/dL (ref 0.3–1.2)
Total Protein: 5.9 g/dL — ABNORMAL LOW (ref 6.5–8.1)

## 2020-04-04 LAB — CBC
HCT: 37.3 % (ref 36.0–46.0)
Hemoglobin: 12.1 g/dL (ref 12.0–15.0)
MCH: 28.8 pg (ref 26.0–34.0)
MCHC: 32.4 g/dL (ref 30.0–36.0)
MCV: 88.8 fL (ref 80.0–100.0)
Platelets: 193 10*3/uL (ref 150–400)
RBC: 4.2 MIL/uL (ref 3.87–5.11)
RDW: 12.7 % (ref 11.5–15.5)
WBC: 5.3 10*3/uL (ref 4.0–10.5)
nRBC: 0 % (ref 0.0–0.2)

## 2020-04-04 LAB — URINE CULTURE: Culture: <10000 — AB

## 2020-04-04 MED ORDER — DICYCLOMINE HCL 10 MG PO CAPS: 10.0000 mg | ORAL_CAPSULE | ORAL | Status: DC

## 2020-04-04 MED ORDER — IOHEXOL 300 MG/ML  SOLN
100.0000 mL | Freq: Once | INTRAMUSCULAR | Status: AC | PRN
  Administered 2020-04-04: 100 mL via INTRAVENOUS

## 2020-04-04 MED ORDER — ACETAMINOPHEN-CODEINE #3 300-30 MG PO TABS: 1.0000 | ORAL_TABLET | Freq: Three times a day (TID) | ORAL | 0 refills | Status: DC | PRN

## 2020-04-04 MED ORDER — IOHEXOL 9 MG/ML PO SOLN
ORAL | Status: AC
  Filled 2020-04-04: qty 1000

## 2020-04-04 MED ORDER — ACETAMINOPHEN-CODEINE #3 300-30 MG PO TABS: 1.0000 | ORAL_TABLET | Freq: Four times a day (QID) | ORAL | Status: DC | PRN

## 2020-04-04 MED ORDER — MORPHINE SULFATE (PF) 2 MG/ML IV SOLN
2.0000 mg | INTRAVENOUS | Status: DC | PRN
  Administered 2020-04-04 (×2): 2 mg via INTRAVENOUS
  Filled 2020-04-04 (×2): qty 1

## 2020-04-04 MED ORDER — VENLAFAXINE HCL ER 75 MG PO CP24
225.0000 mg | ORAL_CAPSULE | Freq: Every day | ORAL | Status: DC
  Administered 2020-04-04: 225 mg via ORAL
  Filled 2020-04-04: qty 3

## 2020-04-04 NOTE — Progress Notes (Signed)
Physical Therapy Treatment Patient Details Name: Whitney Galloway MRN: [id number] DOB: 13-Feb-1929 Today's Date: 04/04/2020    History of Present Illness Whitney Galloway is a 86 y.o. female with medical history significant of hypertension, hyperlipidemia, GERD, who normally lives independently, is brought to the hospital with dysuria, possible change in mental status.  Patient is currently sedated and unable to provide any significant history.  History is obtained from patient's daughter.  It is reported that patient had been expressing possible dysuria and redness in her genital area for the past few days.  She had seen her primary care physician on Thursday and was prescribed an antibiotic.  It is unclear which antibiotic was initially prescribed, but she took 1 dose and continued to feel unwell.  She feels generally weak.  Her primary care physician was called back in a different antibiotic was prescribed which she took for the last 2 days.  Unfortunately, she felt her symptoms were getting worse and so she has to be brought to the hospital.  There was some reports that she complained of shortness of breath to EMS, but daughter reports that she did not note any worsening shortness of breath.  Patient was a chronic smoker and has had episodes of cough and shortness of breath intermittently, her current respiratory status is no different than normal per daughter.    PT Comments    Pt very HOH, requiring constant verbal/tactile cues but not very successful so therapist hand wrote instructions on paper for pt to read. Pt unsteady with mobility in room requiring min A to steady and maneuver RW to get to restroom. Improved steadiness once ambulating in open environment in hallway and tolerates longer distance, no loss of balance or physical assist required. Pt cued for safety with IV pole while navigating room, able to complete pericare ind, cued to use handrails to assist in rising off low seated toilet.  Pt tolerates remaining up in recliner at EOS, lunch in front of pt, chair alarm on and RN in room. Patient will benefit from continued physical therapy in hospital and recommendations below to increase strength, balance, endurance for safe ADLs and gait.    Follow Up Recommendations  Home health PT;Supervision for mobility/OOB;Supervision - Intermittent     Equipment Recommendations  Rolling walker with 5" wheels    Recommendations for Other Services       Precautions / Restrictions Precautions Precautions: Fall Restrictions Weight Bearing Restrictions: No    Mobility  Bed Mobility Overal bed mobility: Modified Independent   Transfers Overall transfer level: Needs assistance Equipment used: Rolling walker (2 wheeled) Transfers: Sit to/from [facility] Sit to Stand: Min guard Stand pivot transfers: Min guard  General transfer comment: min G from low seated toilet, use of grab bars to rise; tactile cues with pivoting to improve safety  Ambulation/Gait Ambulation/Gait assistance: Min assist;Min guard Gait Distance (Feet): 120 Feet Assistive device: Rolling walker (2 wheeled) Gait Pattern/deviations: Step-through pattern;Decreased stride length;Trunk flexed Gait velocity: decreased   General Gait Details: initially min A for steadying and assist to maneuver RW in room, min G in open hallway and with distance improving balance   Stairs             Wheelchair Mobility    Modified Rankin (Stroke Patients Only)       Balance Overall balance assessment: Needs assistance Sitting-balance support: Feet supported;No upper extremity supported Sitting balance-Leahy Scale: Good Sitting balance - Comments: seated at EOB   Standing balance support:  During functional activity;No upper extremity supported Standing balance-Leahy Scale: Fair Standing balance comment: with RW       Cognition Arousal/Alertness: Awake/alert Behavior During Therapy: WFL for  tasks assessed/performed Overall Cognitive Status: Within Functional Limits for tasks assessed        General Comments: Pt very HOH, verbal/tactile cues with written instruction to improve command following      Exercises      General Comments        Pertinent Vitals/Pain Pain Assessment: No/denies pain    Home Living           Prior Function            PT Goals (current goals can now be found in the care plan section) Acute Rehab PT Goals Patient Stated Goal: return home with family to assist PT Goal Formulation: With patient Time For Goal Achievement: 04/10/20 Potential to Achieve Goals: Good Progress towards PT goals: Progressing toward goals    Frequency    Min 3X/week      PT Plan Current plan remains appropriate    Co-evaluation              AM-PAC PT "6 Clicks" Mobility   Outcome Measure  Help needed turning from your back to your side while in a flat bed without using bedrails?: None Help needed moving from lying on your back to sitting on the side of a flat bed without using bedrails?: None Help needed moving to and from a bed to a chair (including a wheelchair)?: A Little Help needed standing up from a chair using your arms (e.g., wheelchair or bedside chair)?: A Little Help needed to walk in hospital room?: A Little Help needed climbing 3-5 steps with a railing? : A Little 6 Click Score: 20    End of Session Equipment Utilized During Treatment: Gait belt Activity Tolerance: Patient tolerated treatment well Patient left: in chair;with call bell/phone within reach;with chair alarm set;with nursing/sitter in room Nurse Communication: Mobility status PT Visit Diagnosis: Unsteadiness on feet (R26.81);Other abnormalities of gait and mobility (R26.89);Muscle weakness (generalized) (M62.81)     Time: 6712-4580 PT Time Calculation (min) (ACUTE ONLY): 24 min  Charges:  $Gait Training: 8-22 mins $Therapeutic Activity: 8-22 mins                       Tori Kodiak Rollyson PT, DPT 04/04/20, 12:24 PM [phone]

## 2020-04-04 NOTE — Progress Notes (Signed)
Nsg Discharge Note  Admit Date:  04/02/2020 Discharge date: 04/04/2020   Deerica Waszak [facility] to be D/C'd Home per MD order.  AVS completed.  Copy for chart, and copy for patient signed, and dated. Patient/caregiver able to verbalize understanding.  Discharge Medication: Allergies as of 04/04/2020   No Known Allergies     Medication List    STOP taking these medications   Aleve 220 MG tablet Generic drug: naproxen sodium   carboxymethylcellulose 0.5 % Soln Commonly known as: REFRESH PLUS   ciprofloxacin 250 MG tablet Commonly known as: CIPRO   lidocaine 2 % solution Commonly known as: XYLOCAINE   mupirocin ointment 2 % Commonly known as: BACTROBAN   Toprol XL 25 MG 24 hr tablet Generic drug: metoprolol succinate     TAKE these medications   acetaminophen-codeine 300-30 MG tablet Commonly known as: TYLENOL #3 Take 1 tablet by mouth every 8 (eight) hours as needed for moderate pain.   albuterol 108 (90 Base) MCG/ACT inhaler Commonly known as: VENTOLIN HFA Inhale into the lungs.   amlodipine-atorvastatin 10-20 MG tablet Commonly known as: CADUET Take 1 tablet by mouth daily.   calcium carbonate 500 MG chewable tablet Commonly known as: TUMS - dosed in mg elemental calcium Chew 1 tablet by mouth as needed. gas   dicyclomine 10 MG capsule Commonly known as: BENTYL 1 po every other night followed by another dose in the morning. YOU MAY USE ONE AT 12 NOON IF NEEDED. What changed:   how much to take  how to take this  when to take this  additional instructions   fexofenadine 180 MG tablet Commonly known as: ALLEGRA Take 180 mg by mouth daily.   LORazepam 1 MG tablet Commonly known as: ATIVAN Take 1 mg by mouth at bedtime.   metoprolol tartrate 25 MG tablet Commonly known as: LOPRESSOR Take 25 mg by mouth 2 (two) times daily.   multivitamin with minerals Tabs tablet Take 1 tablet by mouth daily.   PRESERVISION AREDS PO Take by mouth daily.    multivitamin-lutein Caps capsule Take 1 capsule by mouth daily.   omeprazole 20 MG capsule Commonly known as: PRILOSEC TAKE 1 CAPSULE TWICE A DAY BEFORE MEALS What changed: See the new instructions.   polyethylene glycol 17 g packet Commonly known as: MIRALAX / GLYCOLAX Take 17 g by mouth daily as needed.   PROBIOTIC DAILY PO Take by mouth every other day.   SYSTANE OP Place 1 drop into both eyes daily.   Venlafaxine HCl 225 MG Tb24 Take 225 mg by mouth daily.       Discharge Assessment: Vitals:   04/04/20 0829 04/04/20 1500  BP: 138/60 (!) 135/54  Pulse: 81 78  Resp:  18  Temp:  98 F (36.7 C)  SpO2:  99%   Skin clean, dry and intact without evidence of skin break down, no evidence of skin tears noted. IV catheter discontinued intact. Site without signs and symptoms of complications - no redness or edema noted at insertion site, patient denies c/o pain - only slight tenderness at site.  Dressing with slight pressure applied.  D/c Instructions-Education: Discharge instructions given to patient/family with verbalized understanding. D/c education completed with patient/family including follow up instructions, medication list, d/c activities limitations if indicated, with other d/c instructions as indicated by MD - patient able to verbalize understanding, all questions fully answered. Patient instructed to return to ED, call 911, or call MD for any changes in condition.  Patient escorted via  WC, and D/C home via private auto.  Dorcas Mcmurray, LPN 1/79/8102 5:48 PM

## 2020-04-05 LAB — HOMOCYSTEINE: Homocysteine: 7.5 umol/L (ref 0.0–21.3)

## 2020-04-05 NOTE — Discharge Summary (Signed)
Physician Discharge Summary  Jeni Duling [facility] LOV:[id number] DOB: 06-28-1929 DOA: 04/02/2020  PCP: Celene Squibb, MD  Admit date: 04/02/2020 Discharge date: 04/04/2020  Admitted From: Home Disposition: Home  Recommendations for Outpatient Follow-up:  1. Follow up with PCP in 1-2 weeks 2. Please obtain BMP/CBC in one week  Home Health: Home health PT Equipment/Devices:  Discharge Condition: Stable CODE STATUS: DNR Diet recommendation: Heart healthy  Brief/Interim Summary: HPI: Whitney Galloway is a 86 y.o. female with medical history significant of hypertension, hyperlipidemia, GERD, who normally lives independently, is brought to the hospital with dysuria, possible change in mental status.  Patient is currently sedated and unable to provide any significant history.  History is obtained from patient's daughter.  It is reported that patient had been expressing possible dysuria and redness in her genital area for the past few days.  She had seen her primary care physician on Thursday and was prescribed an antibiotic.  It is unclear which antibiotic was initially prescribed, but she took 1 dose and continued to feel unwell.  She feels generally weak.  Her primary care physician was called back in a different antibiotic was prescribed which she took for the last 2 days.  Unfortunately, she felt her symptoms were getting worse and so she has to be brought to the hospital.  There was some reports that she complained of shortness of breath to EMS, but daughter reports that she did not note any worsening shortness of breath.  Patient was a chronic smoker and has had episodes of cough and shortness of breath intermittently, her current respiratory status is no different than normal per daughter  Discharge Diagnoses:  Active Problems:   DEPRESSION/ANXIETY   Essential hypertension   Altered mental status   Hypokalemia   Acute encephalopathy  1. Acute encephalopathy. Initially felt to be due to  urinary tract infection, although urine cultures have shown no growth.  Urine culture results from primary care physician have also been reviewed that showed no growth.  She was recently started on ciprofloxacin after which her mental status reportedly got worse.  This could be related to fluoroquinolones.    She was seen by neurology who also felt that her encephalopathy may be related to medications.  CT head did not show any acute findings.  With supportive treatment, overall mental status has returned to baseline.  She is awake, alert, coherent. 2. Depression/anxiety.  Continue on Ativan as needed.  She is chronically on venlafaxine which is currently on hold. 3. Hypokalemia.  Replace 4. Hypertension.  Chronically on amlodipine.  Blood pressures have been stable. 5. Hyperlipidemia.  Continue statin 6. Generalized weakness.  Seen by physical therapy with recommendations for home health PT  Discharge Instructions  Discharge Instructions    Diet - low sodium heart healthy   Complete by: As directed    Increase activity slowly   Complete by: As directed      Allergies as of 04/04/2020   No Known Allergies     Medication List    STOP taking these medications   Aleve 220 MG tablet Generic drug: naproxen sodium   carboxymethylcellulose 0.5 % Soln Commonly known as: REFRESH PLUS   ciprofloxacin 250 MG tablet Commonly known as: CIPRO   lidocaine 2 % solution Commonly known as: XYLOCAINE   mupirocin ointment 2 % Commonly known as: BACTROBAN   Toprol XL 25 MG 24 hr tablet Generic drug: metoprolol succinate     TAKE these medications   acetaminophen-codeine 300-30  MG tablet Commonly known as: TYLENOL #3 Take 1 tablet by mouth every 8 (eight) hours as needed for moderate pain.   albuterol 108 (90 Base) MCG/ACT inhaler Commonly known as: VENTOLIN HFA Inhale into the lungs.   amlodipine-atorvastatin 10-20 MG tablet Commonly known as: CADUET Take 1 tablet by mouth daily.    calcium carbonate 500 MG chewable tablet Commonly known as: TUMS - dosed in mg elemental calcium Chew 1 tablet by mouth as needed. gas   dicyclomine 10 MG capsule Commonly known as: BENTYL 1 po every other night followed by another dose in the morning. YOU MAY USE ONE AT 12 NOON IF NEEDED. What changed:   how much to take  how to take this  when to take this  additional instructions   fexofenadine 180 MG tablet Commonly known as: ALLEGRA Take 180 mg by mouth daily.   LORazepam 1 MG tablet Commonly known as: ATIVAN Take 1 mg by mouth at bedtime.   metoprolol tartrate 25 MG tablet Commonly known as: LOPRESSOR Take 25 mg by mouth 2 (two) times daily.   multivitamin with minerals Tabs tablet Take 1 tablet by mouth daily.   PRESERVISION AREDS PO Take by mouth daily.   multivitamin-lutein Caps capsule Take 1 capsule by mouth daily.   omeprazole 20 MG capsule Commonly known as: PRILOSEC TAKE 1 CAPSULE TWICE A DAY BEFORE MEALS What changed: See the new instructions.   polyethylene glycol 17 g packet Commonly known as: MIRALAX / GLYCOLAX Take 17 g by mouth daily as needed.   PROBIOTIC DAILY PO Take by mouth every other day.   SYSTANE OP Place 1 drop into both eyes daily.   Venlafaxine HCl 225 MG Tb24 Take 225 mg by mouth daily.       Follow-up Information    Celene Squibb, MD Follow up.   Specialty: Internal Medicine Why: Appointment time office to call with date and time, Contact information: [place] [facility] [address] [phone]              No Known Allergies  Consultations:  Neurology   Procedures/Studies: CT HEAD WO CONTRAST  Result Date: 04/03/2020 CLINICAL DATA:  Altered mental status of unknown etiology. EXAM: CT HEAD WITHOUT CONTRAST TECHNIQUE: Contiguous axial images were obtained from the base of the skull through the vertex without intravenous contrast. COMPARISON:  None. FINDINGS: Brain: The study is of limited  utility because extensive artifact from a cochlear implant on the left as well as some motion degradation. Allowing for those factors, I do not see any acute or reversible pathology. There appears to be mild chronic small-vessel change of the cerebral hemispheric deep white matter. No sign of acute infarction, hemorrhage, mass or extra-axial collection. Vascular: There is atherosclerotic calcification of the major vessels at the base of the brain. Skull: Negative other than the partial mastoidectomy on the left. Sinuses/Orbits: Clear/normal Other: None IMPRESSION: Degraded by artifact from the cochlear implant and motion. No acute or reversible intracranial finding is identified. Mild chronic small-vessel change of the cerebral hemispheric white matter. Electronically Signed   By: Nelson Chimes M.D.   On: 04/03/2020 21:32   CT ABDOMEN PELVIS W CONTRAST  Result Date: 04/04/2020 CLINICAL DATA:  Abdominal pain, irritability, restlessness, deaf, recent treatment for UTI EXAM: CT ABDOMEN AND PELVIS WITH CONTRAST TECHNIQUE: Multidetector CT imaging of the abdomen and pelvis was performed using the standard protocol following bolus administration of intravenous contrast. Sagittal and coronal MPR images reconstructed from  axial data set. CONTRAST:  134mL OMNIPAQUE IOHEXOL 300 MG/ML SOLN IV. Dilute oral contrast. COMPARISON:  04/12/2010 FINDINGS: Lower chest: Bibasilar atelectasis Hepatobiliary: Gallbladder and liver normal appearance Pancreas: Normal appearance Spleen: Hypervascular mass identified rising from anterior aspect of spleen, 7.6 x 6.1 x 7.3 cm, measured 6.1 x 4.9 x 6.2 cm on 04/12/2010, demonstrating mild continued interval growth. Lesion is minimally hypervascular to spleen on portal venous phase imaging and demonstrates similar washout to spleen on delayed imaging. Lesion remains most suggestive of a splenic hamartoma. No evidence of necrosis, rupture/hemorrhage or torsion. Adrenals/Urinary Tract: Adrenal  glands, kidneys, ureters, and bladder normal appearance Stomach/Bowel: Normal appendix. Stomach unremarkable. Extensive diverticulosis of sigmoid colon without evidence of diverticulitis. Remaining bowel loops unremarkable. Vascular/Lymphatic: Atherosclerotic calcifications aorta and iliac arteries without aneurysm. Coronary arterial calcifications noted. No adenopathy. Reproductive: Unremarkable uterus and adnexa Other: No free air or free fluid. No hernia or acute inflammatory process. Musculoskeletal: Osseous demineralization. Prior L3-L4 posterior fusion. Degenerative disc disease changes lumbar spine. IMPRESSION: Extensive sigmoid diverticulosis without evidence of diverticulitis. Hypervascular splenic mass 7.6 x 6.1 x 7.3 cm, demonstrating mild continued interval growth since 2011, remains most suggestive of a splenic hamartoma. No acute intra-abdominal or intrapelvic abnormalities. Aortic Atherosclerosis (ICD10-I70.0). Electronically Signed   By: Lavonia Dana M.D.   On: 04/04/2020 15:52   DG Chest Portable 1 View  Result Date: 04/02/2020 CLINICAL DATA:  Shortness of breath. EXAM: PORTABLE CHEST 1 VIEW COMPARISON:  December 29, 2016 FINDINGS: Postsurgical changes on the right with scarring and suture line. The heart, hila, mediastinum, lungs, and pleura are unremarkable. IMPRESSION: No acute abnormalities.  Postsurgical changes on the right. Electronically Signed   By: Dorise Bullion III M.D   On: 04/02/2020 10:43       Subjective:  Patient is awake, much more calm and coherent today. Discharge Exam: Vitals:   04/03/20 2053 04/04/20 0508 04/04/20 0829 04/04/20 1500  BP: (!) 110/52 (!) 121/44 138/60 (!) 135/54  Pulse: 89 73 81 78  Resp: 20 20  18   Temp: 98.5 F (36.9 C) 97.8 F (36.6 C)  98 F (36.7 C)  TempSrc: Oral Oral  Oral  SpO2: 98% 99%  99%  Weight:      Height:        General: Pt is alert, awake, not in acute distress Cardiovascular: RRR, S1/S2 +, no rubs, no  gallops Respiratory: CTA bilaterally, no wheezing, no rhonchi Abdominal: Soft, NT, ND, bowel sounds + Extremities: no edema, no cyanosis    The results of significant diagnostics from this hospitalization (including imaging, microbiology, ancillary and laboratory) are listed below for reference.     Microbiology: Recent Results (from the past 240 hour(s))  SARS Coronavirus 2 by RT PCR (hospital order, performed in [facility] hospital lab) Nasopharyngeal Nasopharyngeal Swab     Status: None   Collection Time: 04/02/20  5:21 PM   Specimen: Nasopharyngeal Swab  Result Value Ref Range Status   SARS Coronavirus 2 NEGATIVE NEGATIVE Final    Comment: (NOTE) SARS-CoV-2 target nucleic acids are NOT DETECTED.  The SARS-CoV-2 RNA is generally detectable in upper and lower respiratory specimens during the acute phase of infection. The lowest concentration of SARS-CoV-2 viral copies this assay can detect is 250 copies / mL. A negative result does not preclude SARS-CoV-2 infection and should not be used as the sole basis for treatment or other patient management decisions.  A negative result may occur with improper specimen collection / handling, submission of specimen other  than nasopharyngeal swab, presence of viral mutation(s) within the areas targeted by this assay, and inadequate number of viral copies (<250 copies / mL). A negative result must be combined with clinical observations, patient history, and epidemiological information.  Fact Sheet for Patients:   StrictlyIdeas.no  Fact Sheet for Healthcare Providers: BankingDealers.co.za  This test is not yet approved or  cleared by the [place] FDA and has been authorized for detection and/or diagnosis of SARS-CoV-2 by FDA under an Emergency Use Authorization (EUA).  This EUA will remain in effect (meaning this test can be used) for the duration of the COVID-19 declaration under  Section 564(b)(1) of the Act, 21 U.S.C. section 360bbb-3(b)(1), unless the authorization is terminated or revoked sooner.  Performed at [facility], [address] [place], [place] [address]   Urine culture     Status: Abnormal   Collection Time: 04/02/20  9:45 PM   Specimen: Urine, Clean Catch  Result Value Ref Range Status   Specimen Description   Final    URINE, CLEAN CATCH Performed at [facility], [address] [place], [place] [address]    Special Requests   Final    NONE Performed at [facility], [address] [place], [place] [address]    Culture (A)  Final    <10,000 COLONIES/mL INSIGNIFICANT GROWTH Performed at [facility] [address] [place], [place] [address]    Report Status 04/04/2020 FINAL  Final     Labs: BNP (last 3 results) No results for input(s): BNP in the last 8760 hours. Basic Metabolic Panel: Recent Labs  Lab 04/02/20 1204 04/03/20 0405 04/04/20 0546  NA 138 141 141  K 3.4* 4.2 4.0  CL 103 107 107  CO2 23 24 25   GLUCOSE 113* 86 103*  BUN 13 13 8   CREATININE 0.70 0.62 0.53  CALCIUM 9.1 8.6* 8.8*   Liver Function Tests: Recent Labs  Lab 04/02/20 1204 04/04/20 0546  AST 34 31  ALT 22 18  ALKPHOS 91 80  BILITOT 0.6 0.7  PROT 6.7 5.9*  ALBUMIN 4.1 3.6   No results for input(s): LIPASE, AMYLASE in the last 168 hours. No results for input(s): AMMONIA in the last 168 hours. CBC: Recent Labs  Lab 04/02/20 1204 04/03/20 0405 04/04/20 0546  WBC 7.0 6.0 5.3  NEUTROABS 4.6  --   --   HGB 13.8 12.8 12.1  HCT 41.0 40.0 37.3  MCV 86.3 89.3 88.8  PLT 230 192 193   Cardiac Enzymes: No results for input(s): CKTOTAL, CKMB, CKMBINDEX, TROPONINI in the last 168 hours. BNP: Invalid input(s): POCBNP CBG: No results for input(s): GLUCAP in the last 168 hours. D-Dimer No results for input(s): DDIMER in the last 72 hours. Hgb A1c No results for input(s): HGBA1C in the last 72 hours. Lipid Profile No results for input(s):  CHOL, HDL, LDLCALC, TRIG, CHOLHDL, LDLDIRECT in the last 72 hours. Thyroid function studies Recent Labs    04/03/20 2003  TSH 0.804   Anemia work up Recent Labs    04/03/20 2003  VITAMINB12 927*   Urinalysis    Component Value Date/Time   COLORURINE YELLOW 04/02/2020 2145   APPEARANCEUR CLEAR 04/02/2020 2145   LABSPEC 1.014 04/02/2020 2145   PHURINE 5.0 04/02/2020 2145   GLUCOSEU NEGATIVE 04/02/2020 2145   HGBUR NEGATIVE 04/02/2020 2145   BILIRUBINUR NEGATIVE 04/02/2020 2145   KETONESUR 20 (A) 04/02/2020 2145   PROTEINUR NEGATIVE 04/02/2020 2145   UROBILINOGEN 0.2 07/29/2015 2049  NITRITE NEGATIVE 04/02/2020 2145   LEUKOCYTESUR NEGATIVE 04/02/2020 2145   Sepsis Labs Invalid input(s): PROCALCITONIN,  WBC,  LACTICIDVEN Microbiology Recent Results (from the past 240 hour(s))  SARS Coronavirus 2 by RT PCR (hospital order, performed in [facility] hospital lab) Nasopharyngeal Nasopharyngeal Swab     Status: None   Collection Time: 04/02/20  5:21 PM   Specimen: Nasopharyngeal Swab  Result Value Ref Range Status   SARS Coronavirus 2 NEGATIVE NEGATIVE Final    Comment: (NOTE) SARS-CoV-2 target nucleic acids are NOT DETECTED.  The SARS-CoV-2 RNA is generally detectable in upper and lower respiratory specimens during the acute phase of infection. The lowest concentration of SARS-CoV-2 viral copies this assay can detect is 250 copies / mL. A negative result does not preclude SARS-CoV-2 infection and should not be used as the sole basis for treatment or other patient management decisions.  A negative result may occur with improper specimen collection / handling, submission of specimen other than nasopharyngeal swab, presence of viral mutation(s) within the areas targeted by this assay, and inadequate number of viral copies (<250 copies / mL). A negative result must be combined with clinical observations, patient history, and epidemiological information.  Fact Sheet for  Patients:   StrictlyIdeas.no  Fact Sheet for Healthcare Providers: BankingDealers.co.za  This test is not yet approved or  cleared by the [place] FDA and has been authorized for detection and/or diagnosis of SARS-CoV-2 by FDA under an Emergency Use Authorization (EUA).  This EUA will remain in effect (meaning this test can be used) for the duration of the COVID-19 declaration under Section 564(b)(1) of the Act, 21 U.S.C. section 360bbb-3(b)(1), unless the authorization is terminated or revoked sooner.  Performed at [facility], [address] [place], [place] [address]   Urine culture     Status: Abnormal   Collection Time: 04/02/20  9:45 PM   Specimen: Urine, Clean Catch  Result Value Ref Range Status   Specimen Description   Final    URINE, CLEAN CATCH Performed at [facility], [address], [place], [place] [address]    Special Requests   Final    NONE Performed at [facility], [address] [place], [place] [address]    Culture (A)  Final    <10,000 COLONIES/mL INSIGNIFICANT GROWTH Performed at [facility] [address], [place], [place] [address]    Report Status 04/04/2020 FINAL  Final     Time coordinating discharge: 73mins  SIGNED:   Kathie Dike, MD  [facility] 04/05/2020, 1:19 PM   If 7PM-7AM, please contact night-coverage [email or web address]

## 2020-05-03 ENCOUNTER — Other Ambulatory Visit: Payer: Self-pay | Admitting: Nurse Practitioner

## 2020-05-03 NOTE — Telephone Encounter (Signed)
I have refilled but needs office visit.

## 2020-05-04 ENCOUNTER — Encounter: Payer: Self-pay | Admitting: Gastroenterology

## 2020-05-04 NOTE — Telephone Encounter (Signed)
SENT PATIENT LETTER TO MAKE APPOINTMENT

## 2020-06-14 ENCOUNTER — Other Ambulatory Visit (HOSPITAL_COMMUNITY): Payer: Self-pay | Admitting: Internal Medicine

## 2020-06-14 ENCOUNTER — Other Ambulatory Visit: Payer: Self-pay | Admitting: Internal Medicine

## 2020-06-14 DIAGNOSIS — R1909 Other intra-abdominal and pelvic swelling, mass and lump: Secondary | ICD-10-CM

## 2020-06-20 ENCOUNTER — Encounter (HOSPITAL_COMMUNITY): Payer: Self-pay

## 2020-06-20 ENCOUNTER — Ambulatory Visit (HOSPITAL_COMMUNITY): Payer: Medicare HMO

## 2020-06-21 ENCOUNTER — Ambulatory Visit: Payer: Medicare HMO | Admitting: Gastroenterology

## 2020-06-22 ENCOUNTER — Ambulatory Visit (INDEPENDENT_AMBULATORY_CARE_PROVIDER_SITE_OTHER): Payer: Medicare HMO

## 2020-06-22 ENCOUNTER — Ambulatory Visit
Admission: EM | Admit: 2020-06-22 | Discharge: 2020-06-22 | Disposition: A | Discharge status: Home / Self Care | Payer: Medicare HMO | Attending: Emergency Medicine | Admitting: Emergency Medicine

## 2020-06-22 ENCOUNTER — Encounter: Payer: Self-pay | Admitting: Emergency Medicine

## 2020-06-22 ENCOUNTER — Telehealth: Payer: Self-pay | Admitting: Orthopedic Surgery

## 2020-06-22 DIAGNOSIS — M25522 Pain in left elbow: Secondary | ICD-10-CM | POA: Diagnosis not present

## 2020-06-22 DIAGNOSIS — M79602 Pain in left arm: Secondary | ICD-10-CM

## 2020-06-22 DIAGNOSIS — R202 Paresthesia of skin: Secondary | ICD-10-CM

## 2020-06-22 DIAGNOSIS — S52135A Nondisplaced fracture of neck of left radius, initial encounter for closed fracture: Secondary | ICD-10-CM | POA: Diagnosis not present

## 2020-06-22 DIAGNOSIS — W19XXXA Unspecified fall, initial encounter: Secondary | ICD-10-CM | POA: Diagnosis not present

## 2020-06-22 DIAGNOSIS — M79622 Pain in left upper arm: Secondary | ICD-10-CM

## 2020-06-22 NOTE — ED Provider Notes (Signed)
[place]   [id number] 06/22/20 Arrival Time: 9935   Chief Complaint  Patient presents with  . Arm Pain     SUBJECTIVE: History from: patient and family.  Whitney Galloway is a 86 y.o. female who presented to the urgent care for complaint of left arm, elbow pain for the past 1 day.  States she had a fall yesterday.  Localized pain to the left elbow and mild pain to left arm.  She describes the pain as constant and achy.  She has tried OTC medications without relief.  Her symptoms are made worse with ROM.  She denies similar symptoms in the past.  Denies chills, fever, nausea, vomiting, LOC, confusion.   ROS: As per HPI.  All other pertinent ROS negative.     Past Medical History:  Diagnosis Date  . Bowel incontinence   . Colon polyp 1994 Malignant polyp   2005 NL BE; 2006 incomplete TCS DUE TO REDUNDNACY  . Diverticulosis   . Fibromyalgia   . Gastritis   . Gastroparesis 2009   65% RETENTION AT 2 HOURS  . GERD (gastroesophageal reflux disease)   . Hard of hearing   . Hernia of unspecified site of abdominal cavity without mention of obstruction or gangrene   . Hiatal hernia   . HIATAL HERNIA 06/23/2008   Qualifier: Diagnosis of  By: Craige Cotta    . HTN (hypertension)   . IBS (irritable bowel syndrome)   . Osteoporosis   . S/P endoscopy 2007, 2009   2007: single distal esophageal erosion, s/p 66 F dilation, no web/ring/stricture noted; 2009: normal, Bravo with adequate suppression on Prevacid BID  . Splenic lesion HAMARTOMA  . TB (tuberculosis) AGE 23  . Wears dentures    top  . Wears glasses   . Wears hearing aid    Past Surgical History:  Procedure Laterality Date  . BACK SURGERY  2014   lumbar lam  . Benign tumor removed from bilateral breast    . cataract surgery    . COCHLEAR IMPLANT Left 09/21/2014   Procedure: COCHLEAR IMPLANT LEFT EAR;  Surgeon: Fannie Knee, MD;  Location: [facility];  Service: ENT;  Laterality: Left;  .  COLONOSCOPY W/ POLYPECTOMY    . ESOPHAGOGASTRODUODENOSCOPY  08/27/2011   hiatal hernia/mild gastritis  . ESOPHAGOGASTRODUODENOSCOPY  05/25/2008    Normal esophagus without evidence of Barrett mass/  Normal stomach, duodenal bulb, and second portion of the duodenum  . ESOPHAGOGASTRODUODENOSCOPY  08/11/2006   Single erosion at distal esophagus and a small sliding hiatal hernia/ No evidence of ring or stricture but esophagus was dilated by passing 79 [place] Maloney dilator/ Prepyloric antral erythema but no evidence of erosions or peptic ulcer disease.  . ESOPHAGOGASTRODUODENOSCOPY  09/03/2005   Small sliding hiatal hernia without evidence of ring or  stricture formation. Esophagus dilated by passing 54-French Maloney dilator   given history of dysphagia  . GIVENS CAPSULE STUDY  05/27/2008   Adequate acid suppression with proton pump inhibitor.  [name] LEFT WRIST CYST REMOVAL    . LUNG SURGERY for pulmonary nodules    . Mass removed from roof of mouth    . SAVORY DILATION  08/27/2011   stricture in the distal esophagus  . UPPER GASTROINTESTINAL ENDOSCOPY  2006  . X-STOP IMPLANTATION     No Known Allergies No current facility-administered medications on file prior to encounter.   Current Outpatient Medications on File Prior to Encounter  Medication Sig Dispense Refill  .  acetaminophen-codeine (TYLENOL #3) 300-30 MG tablet Take 1 tablet by mouth every 8 (eight) hours as needed for moderate pain. 15 tablet 0  . albuterol (VENTOLIN HFA) 108 (90 Base) MCG/ACT inhaler Inhale into the lungs.    [name] amlodipine-atorvastatin (CADUET) 10-20 MG tablet Take 1 tablet by mouth daily.    . calcium carbonate (TUMS - DOSED IN MG ELEMENTAL CALCIUM) 500 MG chewable tablet Chew 1 tablet by mouth as needed. gas    . dicyclomine (BENTYL) 10 MG capsule 1 po every other night followed by another dose in the morning. YOU MAY USE ONE AT 12 NOON IF NEEDED. (Patient taking differently: Take 10 mg by mouth every other day.  Takes only as needed) 90 capsule 11  . fexofenadine (ALLEGRA) 180 MG tablet Take 180 mg by mouth daily.    [name] LORazepam (ATIVAN) 1 MG tablet Take 1 mg by mouth at bedtime.     . metoprolol tartrate (LOPRESSOR) 25 MG tablet Take 25 mg by mouth 2 (two) times daily. (Patient not taking: Reported on 04/02/2020)    . Multiple Vitamin (MULTIVITAMIN WITH MINERALS) TABS tablet Take 1 tablet by mouth daily.    . Multiple Vitamins-Minerals (PRESERVISION AREDS PO) Take by mouth daily.    . multivitamin-lutein (OCUVITE-LUTEIN) CAPS Take 1 capsule by mouth daily.  (Patient not taking: Reported on 04/02/2020)    . omeprazole (PRILOSEC) 20 MG capsule Take 1 capsule (20 mg total) by mouth 2 (two) times daily before a meal. 60 capsule 0  . Polyethyl Glycol-Propyl Glycol (SYSTANE OP) Place 1 drop into both eyes daily.    . polyethylene glycol (MIRALAX / GLYCOLAX) packet Take 17 g by mouth daily as needed.    . Probiotic Product (PROBIOTIC DAILY PO) Take by mouth every other day.    . Venlafaxine HCl 225 MG TB24 Take 225 mg by mouth daily.      Social History   Socioeconomic History  . Marital status: Widowed    Spouse name: Not on file  . Number of children: Not on file  . Years of education: Not on file  . Highest education level: Not on file  Occupational History  . Not on file  Tobacco Use  . Smoking status: Former Smoker    Quit date: 09/16/2004    Years since quitting: 15.7  . Smokeless tobacco: Never Used  . Tobacco comment: Quit x 8 years  Substance and Sexual Activity  . Alcohol use: No  . Drug use: No  . Sexual activity: Never  Other Topics Concern  . Not on file  Social History Narrative  . Not on file   Social Determinants of Health   Financial Resource Strain:   . Difficulty of Paying Living Expenses: Not on file  Food Insecurity:   . Worried About Charity fundraiser in the Last Year: Not on file  . Ran Out of Food in the Last Year: Not on file  Transportation Needs:   . Lack of  Transportation (Medical): Not on file  . Lack of Transportation (Non-Medical): Not on file  Physical Activity:   . Days of Exercise per Week: Not on file  . Minutes of Exercise per Session: Not on file  Stress:   . Feeling of Stress : Not on file  Social Connections:   . Frequency of Communication with Friends and Family: Not on file  . Frequency of Social Gatherings with Friends and Family: Not on file  . Attends Religious Services: Not on file  .  Active Member of Clubs or Organizations: Not on file  . Attends Archivist Meetings: Not on file  . Marital Status: Not on file  Intimate Partner Violence:   . Fear of Current or Ex-Partner: Not on file  . Emotionally Abused: Not on file  . Physically Abused: Not on file  . Sexually Abused: Not on file   No family history on file.  OBJECTIVE:  Vitals:   06/22/20 1202  BP: 129/71  Pulse: 66  Resp: 16  Temp: 98.7 F (37.1 C)  SpO2: 98%     Physical Exam Vitals and nursing note reviewed.  Constitutional:      General: She is not in acute distress.    Appearance: Normal appearance. She is normal weight. She is not ill-appearing, toxic-appearing or diaphoretic.  HENT:     Head: Normocephalic.  Cardiovascular:     Rate and Rhythm: Normal rate and regular rhythm.     Pulses: Normal pulses.     Heart sounds: Normal heart sounds. No murmur heard.  No friction rub. No gallop.   Pulmonary:     Effort: Pulmonary effort is normal. No respiratory distress.     Breath sounds: Normal breath sounds. No stridor. No wheezing, rhonchi or rales.  Chest:     Chest wall: No tenderness.  Musculoskeletal:        General: Tenderness present.     Right upper arm: Normal.     Left upper arm: Tenderness present.     Right elbow: Normal.     Left elbow: Tenderness present.     Comments: The left elbow is with obvious deformity when compared to the right elbow.  No surface trauma, ecchymosis, open wound, warmth or lesion present.   Limited range of motion due to pain.  Neurovascular status intact.  Neurological:     Mental Status: She is alert and oriented to person, place, and time.    Psychological: alert and cooperative; normal mood and affect  LABS:  No results found for this or any previous visit (from the past 24 hour(s)).   RADIOLOGY:  DG Humerus Left  Result Date: 06/22/2020 CLINICAL DATA:  [place] yesterday injuring upper LEFT arm, pain at distal humerus EXAM: LEFT HUMERUS - 2+ VIEW COMPARISON:  None FINDINGS: Osseous demineralization. LEFT elbow and shoulder joint alignments normal. AC joint alignment normal with mild degenerative changes. No humeral fracture, dislocation, or bone destruction. Slight deformity of the LEFT radial head/neck suspicious for a nondisplaced radial neck fracture. IMPRESSION: Intact humerus. Suspect nondisplaced LEFT radial neck fracture. Electronically Signed   By: Lavonia Dana M.D.   On: 06/22/2020 12:29     ASSESSMENT & PLAN:  1. Fall, initial encounter   2. Left elbow pain   3. Left arm pain   4. Closed nondisplaced fracture of neck of left radius, initial encounter     No orders of the defined types were placed in this encounter.   Discharge Instructions  Take OTC Tylenol as needed for pain Follow RICE instruction that is attached Follow-up with orthopedic Return or go to ED for worsening of symptoms  Reviewed expectations re: course of current medical issues. Questions answered. Outlined signs and symptoms indicating need for more acute intervention. Patient verbalized understanding. After Visit Summary given.         Emerson Monte, FNP 06/22/20 1247

## 2020-06-22 NOTE — Telephone Encounter (Signed)
Call received from patient's daughter/POA Kelli Churn relaying that patient has a fracture of left arm/shoulder, per [facility] [place]. Offered next available appointment, due to no providers in clinic this afternoon or tomorrow. Daughter states she would like her mom to be seen before then, as she is already moving the arm and sling.  Aware to call back if unable to be seen sooner.

## 2020-06-22 NOTE — Discharge Instructions (Addendum)
Take OTC Tylenol as needed for pain Follow RICE instruction that is attached Follow-up with orthopedic Return or go to ED for worsening of symptoms

## 2020-06-22 NOTE — ED Triage Notes (Signed)
Patient fell yesterday injuring her left arm, humerus and elbow pain. swelling and pain with movement.

## 2020-07-24 ENCOUNTER — Ambulatory Visit (HOSPITAL_COMMUNITY)
Admission: RE | Admit: 2020-07-24 | Discharge: 2020-07-24 | Disposition: A | Discharge status: Home / Self Care | Payer: Medicare HMO | Source: Ambulatory Visit | Attending: Internal Medicine | Admitting: Internal Medicine

## 2020-07-24 ENCOUNTER — Other Ambulatory Visit: Payer: Self-pay

## 2020-07-24 DIAGNOSIS — R1909 Other intra-abdominal and pelvic swelling, mass and lump: Secondary | ICD-10-CM

## 2020-09-05 ENCOUNTER — Encounter: Payer: Self-pay | Admitting: Obstetrics & Gynecology

## 2020-09-05 ENCOUNTER — Other Ambulatory Visit: Payer: Self-pay

## 2020-09-05 ENCOUNTER — Ambulatory Visit (INDEPENDENT_AMBULATORY_CARE_PROVIDER_SITE_OTHER): Payer: Medicare HMO | Admitting: Obstetrics & Gynecology

## 2020-09-05 VITALS — BP 113/71 | HR 72 | Ht 68.0 in | Wt 143.0 lb

## 2020-09-05 DIAGNOSIS — N763 Subacute and chronic vulvitis: Secondary | ICD-10-CM | POA: Diagnosis not present

## 2020-09-05 DIAGNOSIS — B3731 Acute candidiasis of vulva and vagina: Secondary | ICD-10-CM

## 2020-09-05 DIAGNOSIS — B373 Candidiasis of vulva and vagina: Secondary | ICD-10-CM

## 2020-09-05 MED ORDER — FLUCONAZOLE 100 MG PO TABS: ORAL_TABLET | ORAL | 0 refills | Status: DC

## 2020-09-05 NOTE — Progress Notes (Signed)
Chief Complaint  Patient presents with  . Gynecologic Exam      86 y.o. No obstetric history on file. No LMP recorded. Patient is postmenopausal. The current method of family planning is post menopausal status.  Outpatient Encounter Medications as of 09/05/2020  Medication Sig Note  . acetaminophen-codeine (TYLENOL #3) 300-30 MG tablet Take 1 tablet by mouth every 8 (eight) hours as needed for moderate pain.   [name] amlodipine-atorvastatin (CADUET) 10-20 MG tablet Take 1 tablet by mouth daily.   . calcium carbonate (TUMS - DOSED IN MG ELEMENTAL CALCIUM) 500 MG chewable tablet Chew 1 tablet by mouth as needed. gas   . conjugated estrogens (PREMARIN) vaginal cream Place 1 Applicatorful vaginally daily.   [name] dicyclomine (BENTYL) 10 MG capsule 1 po every other night followed by another dose in the morning. YOU MAY USE ONE AT 12 NOON IF NEEDED. (Patient taking differently: Take 10 mg by mouth every other day. Takes only as needed)   . fexofenadine (ALLEGRA) 180 MG tablet Take 180 mg by mouth daily.   [name] LORazepam (ATIVAN) 1 MG tablet Take 1 mg by mouth at bedtime.    . metoprolol tartrate (LOPRESSOR) 25 MG tablet Take 25 mg by mouth 2 (two) times daily.  04/02/2020: Has not started taking this medication.  It is taking the place of Toprol.  . Multiple Vitamin (MULTIVITAMIN WITH MINERALS) TABS tablet Take 1 tablet by mouth daily.   . Multiple Vitamins-Minerals (PRESERVISION AREDS PO) Take by mouth daily.   [name] omeprazole (PRILOSEC) 20 MG capsule Take 1 capsule (20 mg total) by mouth 2 (two) times daily before a meal.   . Polyethyl Glycol-Propyl Glycol (SYSTANE OP) Place 1 drop into both eyes daily.   . polyethylene glycol (MIRALAX / GLYCOLAX) packet Take 17 g by mouth daily as needed.   . Probiotic Product (PROBIOTIC DAILY PO) Take by mouth every other day.   . Venlafaxine HCl 225 MG TB24 Take 225 mg by mouth daily.    [name] albuterol (VENTOLIN HFA) 108 (90 Base) MCG/ACT inhaler Inhale into the  lungs. (Patient not taking: Reported on 09/05/2020)   . fluconazole (DIFLUCAN) 100 MG tablet Take 1 tablet every other day   . [DISCONTINUED] multivitamin-lutein (OCUVITE-LUTEIN) CAPS Take 1 capsule by mouth daily.     No facility-administered encounter medications on file as of 09/05/2020.    Subjective Pt with several months of irritation of her bottom Worsening Has been on fanny cream and vaginal estrogen  Past Medical History:  Diagnosis Date  . Bowel incontinence   . Colon polyp 1994 Malignant polyp   2005 NL BE; 2006 incomplete TCS DUE TO REDUNDNACY  . Diverticulosis   . Fibromyalgia   . Gastritis   . Gastroparesis 2009   65% RETENTION AT 2 HOURS  . GERD (gastroesophageal reflux disease)   . Hard of hearing   . Hernia of unspecified site of abdominal cavity without mention of obstruction or gangrene   . Hiatal hernia   . HIATAL HERNIA 06/23/2008   Qualifier: Diagnosis of  By: Craige Cotta    . HTN (hypertension)   . IBS (irritable bowel syndrome)   . Osteoporosis   . S/P endoscopy 2007, 2009   2007: single distal esophageal erosion, s/p 25 F dilation, no web/ring/stricture noted; 2009: normal, Bravo with adequate suppression on Prevacid BID  . Splenic lesion HAMARTOMA  . TB (tuberculosis) AGE 55  . Wears dentures    top  . Wears glasses   .  Wears hearing aid     Past Surgical History:  Procedure Laterality Date  . BACK SURGERY  2014   lumbar lam  . Benign tumor removed from bilateral breast    . cataract surgery    . COCHLEAR IMPLANT Left 09/21/2014   Procedure: COCHLEAR IMPLANT LEFT EAR;  Surgeon: Fannie Knee, MD;  Location: [facility];  Service: ENT;  Laterality: Left;  . COLONOSCOPY W/ POLYPECTOMY    . ESOPHAGOGASTRODUODENOSCOPY  08/27/2011   hiatal hernia/mild gastritis  . ESOPHAGOGASTRODUODENOSCOPY  05/25/2008    Normal esophagus without evidence of Barrett mass/  Normal stomach, duodenal bulb, and second portion of the duodenum  .  ESOPHAGOGASTRODUODENOSCOPY  08/11/2006   Single erosion at distal esophagus and a small sliding hiatal hernia/ No evidence of ring or stricture but esophagus was dilated by passing 100 [place] Maloney dilator/ Prepyloric antral erythema but no evidence of erosions or peptic ulcer disease.  . ESOPHAGOGASTRODUODENOSCOPY  09/03/2005   Small sliding hiatal hernia without evidence of ring or  stricture formation. Esophagus dilated by passing 54-French Maloney dilator   given history of dysphagia  . GIVENS CAPSULE STUDY  05/27/2008   Adequate acid suppression with proton pump inhibitor.  [name] LEFT WRIST CYST REMOVAL    . LUNG SURGERY for pulmonary nodules    . Mass removed from roof of mouth    . SAVORY DILATION  08/27/2011   stricture in the distal esophagus  . UPPER GASTROINTESTINAL ENDOSCOPY  2006  . X-STOP IMPLANTATION      OB History   No obstetric history on file.     No Known Allergies  Social History   Socioeconomic History  . Marital status: Widowed    Spouse name: Not on file  . Number of children: Not on file  . Years of education: Not on file  . Highest education level: Not on file  Occupational History  . Not on file  Tobacco Use  . Smoking status: Former Smoker    Quit date: 09/16/2004    Years since quitting: 15.9  . Smokeless tobacco: Never Used  . Tobacco comment: Quit x 8 years  Substance and Sexual Activity  . Alcohol use: No  . Drug use: No  . Sexual activity: Never  Other Topics Concern  . Not on file  Social History Narrative  . Not on file   Social Determinants of Health   Financial Resource Strain: Low Risk   . Difficulty of Paying Living Expenses: Not hard at all  Food Insecurity: No Food Insecurity  . Worried About Charity fundraiser in the Last Year: Never true  . Ran Out of Food in the Last Year: Never true  Transportation Needs: No Transportation Needs  . Lack of Transportation (Medical): No  . Lack of Transportation (Non-Medical): No    Physical Activity: Inactive  . Days of Exercise per Week: 0 days  . Minutes of Exercise per Session: 0 min  Stress: Stress Concern Present  . Feeling of Stress : To some extent  Social Connections: Moderately Isolated  . Frequency of Communication with Friends and Family: Three times a week  . Frequency of Social Gatherings with Friends and Family: Three times a week  . Attends Religious Services: Never  . Active Member of Clubs or Organizations: Yes  . Attends Archivist Meetings: Never  . Marital Status: Widowed    History reviewed. No pertinent family history.  Medications:       Current  Outpatient Medications:  .  acetaminophen-codeine (TYLENOL #3) 300-30 MG tablet, Take 1 tablet by mouth every 8 (eight) hours as needed for moderate pain., Disp: 15 tablet, Rfl: 0 .  amlodipine-atorvastatin (CADUET) 10-20 MG tablet, Take 1 tablet by mouth daily., Disp: , Rfl:  .  calcium carbonate (TUMS - DOSED IN MG ELEMENTAL CALCIUM) 500 MG chewable tablet, Chew 1 tablet by mouth as needed. gas, Disp: , Rfl:  .  conjugated estrogens (PREMARIN) vaginal cream, Place 1 Applicatorful vaginally daily., Disp: , Rfl:  .  dicyclomine (BENTYL) 10 MG capsule, 1 po every other night followed by another dose in the morning. YOU MAY USE ONE AT 12 NOON IF NEEDED. (Patient taking differently: Take 10 mg by mouth every other day. Takes only as needed), Disp: 90 capsule, Rfl: 11 .  fexofenadine (ALLEGRA) 180 MG tablet, Take 180 mg by mouth daily., Disp: , Rfl:  .  LORazepam (ATIVAN) 1 MG tablet, Take 1 mg by mouth at bedtime. , Disp: , Rfl:  .  metoprolol tartrate (LOPRESSOR) 25 MG tablet, Take 25 mg by mouth 2 (two) times daily. , Disp: , Rfl:  .  Multiple Vitamin (MULTIVITAMIN WITH MINERALS) TABS tablet, Take 1 tablet by mouth daily., Disp: , Rfl:  .  Multiple Vitamins-Minerals (PRESERVISION AREDS PO), Take by mouth daily., Disp: , Rfl:  .  omeprazole (PRILOSEC) 20 MG capsule, Take 1 capsule (20 mg  total) by mouth 2 (two) times daily before a meal., Disp: 60 capsule, Rfl: 0 .  Polyethyl Glycol-Propyl Glycol (SYSTANE OP), Place 1 drop into both eyes daily., Disp: , Rfl:  .  polyethylene glycol (MIRALAX / GLYCOLAX) packet, Take 17 g by mouth daily as needed., Disp: , Rfl:  .  Probiotic Product (PROBIOTIC DAILY PO), Take by mouth every other day., Disp: , Rfl:  .  Venlafaxine HCl 225 MG TB24, Take 225 mg by mouth daily. , Disp: , Rfl:  .  albuterol (VENTOLIN HFA) 108 (90 Base) MCG/ACT inhaler, Inhale into the lungs. (Patient not taking: Reported on 09/05/2020), Disp: , Rfl:  .  fluconazole (DIFLUCAN) 100 MG tablet, Take 1 tablet every other day, Disp: 14 tablet, Rfl: 0  Objective Blood pressure 113/71, pulse 72, height 5\' 8"  (1.727 m), weight 143 lb (64.9 kg).  Chemical vs candidal vulvovaginitis  or both No lesions Vagina is without any prolapse  Pertinent ROS No burning with urination, frequency or urgency No nausea, vomiting or diarrhea Nor fever chills or other constitutional symptoms   Labs or studies     Impression Diagnoses this Encounter::   ICD-10-CM   1. Chronic vulvitis, related to pads and fecal incontinence  N76.3   2. Candidal vulvovaginitis  B37.3     Established relevant diagnosis(es):   Plan/Recommendations: Meds ordered this encounter  Medications  . fluconazole (DIFLUCAN) 100 MG tablet    Sig: Take 1 tablet every other day    Dispense:  14 tablet    Refill:  0    Labs or Scans Ordered: No orders of the defined types were placed in this encounter.   Management:: Gentian violet used today Continue Fanny cream as prescribed by Dr Juel Burrow office Diflucan qod for 1 month Stop adult wipes Stop premarin cream   Follow up Return in about 1 month (around 10/05/2020) for Follow up, with Dr Elonda Husky.    All questions were answered.

## 2020-10-05 ENCOUNTER — Ambulatory Visit: Payer: Medicare HMO | Admitting: Obstetrics & Gynecology

## 2020-10-09 ENCOUNTER — Ambulatory Visit: Payer: Medicare HMO | Admitting: Obstetrics & Gynecology

## 2020-10-30 ENCOUNTER — Ambulatory Visit: Payer: Medicare HMO | Admitting: Obstetrics & Gynecology

## 2020-11-07 ENCOUNTER — Encounter: Payer: Self-pay | Admitting: Obstetrics & Gynecology

## 2020-11-07 ENCOUNTER — Ambulatory Visit (INDEPENDENT_AMBULATORY_CARE_PROVIDER_SITE_OTHER): Payer: Medicare HMO | Admitting: Obstetrics & Gynecology

## 2020-11-07 ENCOUNTER — Other Ambulatory Visit: Payer: Self-pay

## 2020-11-07 VITALS — BP 105/61 | HR 66 | Ht 68.0 in | Wt 143.0 lb

## 2020-11-07 DIAGNOSIS — N763 Subacute and chronic vulvitis: Secondary | ICD-10-CM

## 2020-11-12 ENCOUNTER — Encounter: Payer: Self-pay | Admitting: Obstetrics & Gynecology

## 2020-11-12 MED ORDER — GERHARDT'S BUTT CREAM: 1.0000 "application " | TOPICAL_CREAM | Freq: Three times a day (TID) | CUTANEOUS | 11 refills | Status: DC

## 2020-11-12 NOTE — Progress Notes (Signed)
Follow up appointment for Chronic pad related vulvitis essentially due to fecal incontinence  Chief Complaint  Patient presents with  . Follow-up    Blood pressure 105/61, pulse 66, height 5\' 8"  (1.727 m), weight 143 lb (64.9 kg).   Pt is doing significantly better than when I first saw her 09/05/20 Her symtoms are "significantly" improved She is using the fanny cream 2-3 times per day Not using adult wipes any longer Not using the premarin cream  Blood pressure 105/61, pulse 66, height 5\' 8"  (1.727 m), weight 143 lb (64.9 kg).  Exam Normal atrophic vulvovaginal exam, no erythema excoriations or abnormalities noted Really looks normal  MEDS ordered this encounter: Meds ordered this encounter  Medications  . Nystatin (GERHARDT'S BUTT CREAM) CREA    Sig: Apply 1 application topically 3 (three) times daily.    Dispense:  1 each    Refill:  11     Orders for this encounter: No orders of the defined types were placed in this encounter.   Impression:   ICD-10-CM   1. Chronic vulvitis, related to pads and fecal incontinence  N76.3    resolved/managed     Plan: Pt is using Fanny cream compounded but I did the best Epic impersonation I could of it so it is in the record officially  S/P diflucan qod x 1 month, no need to repeat  S/P GV painting with no need to repeat  Continue the fanny cream, no wipes(alcohol is very negative for the vulvovaginal area)  Follow Up: Return if symptoms worsen or fail to improve.        Face to face time:  15 minutes  Greater than 50% of the visit time was spent in counseling and coordination of care with the patient.  The summary and outline of the counseling and care coordination is summarized in the note above.   All questions were answered.  Past Medical History:  Diagnosis Date  . Bowel incontinence   . Colon polyp 1994 Malignant polyp   2005 NL BE; 2006 incomplete TCS DUE TO REDUNDNACY  . Diverticulosis   .  Fibromyalgia   . Gastritis   . Gastroparesis 2009   65% RETENTION AT 2 HOURS  . GERD (gastroesophageal reflux disease)   . Hard of hearing   . Hernia of unspecified site of abdominal cavity without mention of obstruction or gangrene   . Hiatal hernia   . HIATAL HERNIA 06/23/2008   Qualifier: Diagnosis of  By: Craige Cotta    . HTN (hypertension)   . IBS (irritable bowel syndrome)   . Osteoporosis   . S/P endoscopy 2007, 2009   2007: single distal esophageal erosion, s/p 48 F dilation, no web/ring/stricture noted; 2009: normal, Bravo with adequate suppression on Prevacid BID  . Splenic lesion HAMARTOMA  . TB (tuberculosis) AGE 87  . Wears dentures    top  . Wears glasses   . Wears hearing aid     Past Surgical History:  Procedure Laterality Date  . BACK SURGERY  2014   lumbar lam  . Benign tumor removed from bilateral breast    . cataract surgery    . COCHLEAR IMPLANT Left 09/21/2014   Procedure: COCHLEAR IMPLANT LEFT EAR;  Surgeon: Fannie Knee, MD;  Location: [place];  Service: ENT;  Laterality: Left;  . COLONOSCOPY W/ POLYPECTOMY    . ESOPHAGOGASTRODUODENOSCOPY  08/27/2011   hiatal hernia/mild gastritis  . ESOPHAGOGASTRODUODENOSCOPY  05/25/2008    Normal esophagus  without evidence of Barrett mass/  Normal stomach, duodenal bulb, and second portion of the duodenum  . ESOPHAGOGASTRODUODENOSCOPY  08/11/2006   Single erosion at distal esophagus and a small sliding hiatal hernia/ No evidence of ring or stricture but esophagus was dilated by passing 11 [place] Maloney dilator/ Prepyloric antral erythema but no evidence of erosions or peptic ulcer disease.  . ESOPHAGOGASTRODUODENOSCOPY  09/03/2005   Small sliding hiatal hernia without evidence of ring or  stricture formation. Esophagus dilated by passing 54-French Maloney dilator   given history of dysphagia  . GIVENS CAPSULE STUDY  05/27/2008   Adequate acid suppression with proton pump inhibitor.  [name] LEFT WRIST  CYST REMOVAL    . LUNG SURGERY for pulmonary nodules    . Mass removed from roof of mouth    . SAVORY DILATION  08/27/2011   stricture in the distal esophagus  . UPPER GASTROINTESTINAL ENDOSCOPY  2006  . X-STOP IMPLANTATION      OB History   No obstetric history on file.     No Known Allergies  Social History   Socioeconomic History  . Marital status: Widowed    Spouse name: Not on file  . Number of children: Not on file  . Years of education: Not on file  . Highest education level: Not on file  Occupational History  . Not on file  Tobacco Use  . Smoking status: Former Smoker    Quit date: 09/16/2004    Years since quitting: 16.1  . Smokeless tobacco: Never Used  . Tobacco comment: Quit x 8 years  Substance and Sexual Activity  . Alcohol use: No  . Drug use: No  . Sexual activity: Never  Other Topics Concern  . Not on file  Social History Narrative  . Not on file   Social Determinants of Health   Financial Resource Strain: Low Risk   . Difficulty of Paying Living Expenses: Not hard at all  Food Insecurity: No Food Insecurity  . Worried About Charity fundraiser in the Last Year: Never true  . Ran Out of Food in the Last Year: Never true  Transportation Needs: No Transportation Needs  . Lack of Transportation (Medical): No  . Lack of Transportation (Non-Medical): No  Physical Activity: Inactive  . Days of Exercise per Week: 0 days  . Minutes of Exercise per Session: 0 min  Stress: Stress Concern Present  . Feeling of Stress : To some extent  Social Connections: Moderately Isolated  . Frequency of Communication with Friends and Family: Three times a week  . Frequency of Social Gatherings with Friends and Family: Three times a week  . Attends Religious Services: Never  . Active Member of Clubs or Organizations: Yes  . Attends Archivist Meetings: Never  . Marital Status: Widowed    History reviewed. No pertinent family history.

## 2020-12-13 ENCOUNTER — Emergency Department (HOSPITAL_COMMUNITY): Payer: Medicare HMO

## 2020-12-13 ENCOUNTER — Emergency Department (HOSPITAL_COMMUNITY)
Admission: EM | Admit: 2020-12-13 | Discharge: 2020-12-13 | Disposition: A | Discharge status: Home / Self Care | Payer: Medicare HMO | Attending: Emergency Medicine | Admitting: Emergency Medicine

## 2020-12-13 ENCOUNTER — Encounter (HOSPITAL_COMMUNITY): Payer: Self-pay | Admitting: Emergency Medicine

## 2020-12-13 ENCOUNTER — Other Ambulatory Visit: Payer: Self-pay

## 2020-12-13 DIAGNOSIS — N390 Urinary tract infection, site not specified: Secondary | ICD-10-CM | POA: Insufficient documentation

## 2020-12-13 DIAGNOSIS — R531 Weakness: Secondary | ICD-10-CM | POA: Diagnosis not present

## 2020-12-13 DIAGNOSIS — G4700 Insomnia, unspecified: Secondary | ICD-10-CM | POA: Diagnosis not present

## 2020-12-13 DIAGNOSIS — Z87891 Personal history of nicotine dependence: Secondary | ICD-10-CM | POA: Diagnosis not present

## 2020-12-13 DIAGNOSIS — Z79899 Other long term (current) drug therapy: Secondary | ICD-10-CM | POA: Diagnosis not present

## 2020-12-13 DIAGNOSIS — Z853 Personal history of malignant neoplasm of breast: Secondary | ICD-10-CM | POA: Insufficient documentation

## 2020-12-13 DIAGNOSIS — I10 Essential (primary) hypertension: Secondary | ICD-10-CM | POA: Insufficient documentation

## 2020-12-13 DIAGNOSIS — R0602 Shortness of breath: Secondary | ICD-10-CM | POA: Insufficient documentation

## 2020-12-13 LAB — COMPREHENSIVE METABOLIC PANEL
ALT: 18 U/L (ref 0–44)
AST: 30 U/L (ref 15–41)
Albumin: 4 g/dL (ref 3.5–5.0)
Alkaline Phosphatase: 82 U/L (ref 38–126)
Anion gap: 10 (ref 5–15)
BUN: 24 mg/dL — ABNORMAL HIGH (ref 8–23)
CO2: 24 mmol/L (ref 22–32)
Calcium: 9.3 mg/dL (ref 8.9–10.3)
Chloride: 103 mmol/L (ref 98–111)
Creatinine, Ser: 0.91 mg/dL (ref 0.44–1.00)
GFR, Estimated: >60 mL/min (ref >60)
Glucose, Bld: 109 mg/dL — ABNORMAL HIGH (ref 70–99)
Potassium: 3.9 mmol/L (ref 3.5–5.1)
Sodium: 137 mmol/L (ref 135–145)
Total Bilirubin: 0.9 mg/dL (ref 0.3–1.2)
Total Protein: 6.6 g/dL (ref 6.5–8.1)

## 2020-12-13 LAB — CBC WITH DIFFERENTIAL/PLATELET
Abs Immature Granulocytes: 0.02 10*3/uL (ref 0.00–0.07)
Basophils Absolute: 0.1 10*3/uL (ref 0.0–0.1)
Basophils Relative: 1 %
Eosinophils Absolute: 0.1 10*3/uL (ref 0.0–0.5)
Eosinophils Relative: 1 %
HCT: 42.3 % (ref 36.0–46.0)
Hemoglobin: 13.9 g/dL (ref 12.0–15.0)
Immature Granulocytes: 0 %
Lymphocytes Relative: 29 %
Lymphs Abs: 1.9 10*3/uL (ref 0.7–4.0)
MCH: 29.5 pg (ref 26.0–34.0)
MCHC: 32.9 g/dL (ref 30.0–36.0)
MCV: 89.8 fL (ref 80.0–100.0)
Monocytes Absolute: 0.4 10*3/uL (ref 0.1–1.0)
Monocytes Relative: 6 %
Neutro Abs: 4.2 10*3/uL (ref 1.7–7.7)
Neutrophils Relative %: 63 %
Platelets: 219 10*3/uL (ref 150–400)
RBC: 4.71 MIL/uL (ref 3.87–5.11)
RDW: 12.7 % (ref 11.5–15.5)
WBC: 6.6 10*3/uL (ref 4.0–10.5)
nRBC: 0 % (ref 0.0–0.2)

## 2020-12-13 LAB — URINALYSIS, ROUTINE W REFLEX MICROSCOPIC
Bilirubin Urine: NEGATIVE
Glucose, UA: NEGATIVE mg/dL
Hgb urine dipstick: NEGATIVE
Ketones, ur: 80 mg/dL — AB
Leukocytes,Ua: NEGATIVE
Nitrite: POSITIVE — AB
Protein, ur: NEGATIVE mg/dL
Specific Gravity, Urine: 1.02 (ref 1.005–1.030)
pH: 5 (ref 5.0–8.0)

## 2020-12-13 MED ORDER — SODIUM CHLORIDE 0.9 % IV BOLUS
500.0000 mL | Freq: Once | INTRAVENOUS | Status: AC
  Administered 2020-12-13: 500 mL via INTRAVENOUS

## 2020-12-13 MED ORDER — CEPHALEXIN 500 MG PO CAPS: 500.0000 mg | ORAL_CAPSULE | Freq: Two times a day (BID) | ORAL | 0 refills | Status: AC

## 2020-12-13 MED ORDER — SODIUM CHLORIDE 0.9 % IV SOLN
1.0000 g | Freq: Once | INTRAVENOUS | Status: AC
  Administered 2020-12-13: 1 g via INTRAVENOUS
  Filled 2020-12-13: qty 10

## 2020-12-13 MED ORDER — CEFTRIAXONE SODIUM 1 G IJ SOLR: 1.0000 g | Freq: Once | INTRAMUSCULAR | 0 refills | Status: DC

## 2020-12-13 NOTE — Clinical Social Work Note (Signed)
Transition of Care [facility]) - Emergency Department Mini Assessment   Patient Details  Name: Whitney Galloway MRN: [id number] Date of Birth: 1928-10-13  Transition of Care [facility]) CM/SW Contact:    Iona Beard, [place] Phone Number: 12/13/2020, 6:51 PM   Clinical Narrative: [facility] consulted for [facility] needs. CSW spoke with pts daughter Kelli Churn about setting up [facility] services. She is interested and agreeable to referral being made. CSW reached out to local agencies to see if someone could accept the referral. Santiago Glad with [facility] has agreed to accept pt referral. She asked that the [facility] orders be placed for [facility] PT and OT as they do not currently have any aides and will send out someone from OT to assist with ADLS.   ED Mini Assessment: What brought you to the Emergency Department? : sleeplessness, anorexia, dehydration and cochlear implant pain  Barriers to Discharge: Barriers Resolved  Barrier interventions: Set up with [place] of departure: Car  Interventions which prevented an admission or readmission: [place] or Services    Patient Contact and Communications        ,          Patient states their goals for this hospitalization and ongoing recovery are:: Return home with [facility] CMS Medicare.gov Compare Post Acute Care list provided to:: Patient Represenative (must comment) Choice offered to / list presented to : Adult Children  Admission diagnosis:  weakness Patient Active Problem List   Diagnosis Date Noted  . Acute encephalopathy 04/03/2020  . Altered mental status 04/02/2020  . Hypokalemia 04/02/2020  . Closed nondisplaced fracture of lateral malleolus of right fibula 09/27/2019  . Postop check 09/21/2014  . Breast cancer, 1971, status post right lumpectomy 01/04/2014  . Hemorrhoids, internal, with bleeding 10/14/2013  . Anemia 09/09/2013  . History of spinal surgery 12/17/2012  . Difficulty hearing 12/17/2012  . Herniated nucleus pulposus, L3-4 with  stenosis and spondylolisthesis 10/20/2012  . Night sweats 12/26/2011  . Stiffness of joints, not elsewhere classified, multiple sites 12/19/2011  . Weakness of left leg 12/19/2011  . Difficulty in walking(719.7) 12/19/2011  . GASTROPARESIS 11/03/2009  . COLONIC POLYPS, ADENOMATOUS 06/23/2008  . UNSPECIFIED DISEASE OF SPLEEN 06/23/2008  . DEPRESSION/ANXIETY 06/23/2008  . Essential hypertension 06/23/2008  . ALLERGIC RHINITIS, SEASONAL 06/23/2008  . GASTROESOPHAGEAL REFLUX DISEASE, CHRONIC 06/23/2008  . DIVERTICULOSIS OF COLON 06/23/2008  . Irritable bowel syndrome 06/23/2008  . ECZEMA 06/23/2008  . OSTEOARTHRITIS, SPINE 06/23/2008  . DEGENERATIVE DISC DISEASE 06/23/2008  . OTHER DYSPHAGIA 06/23/2008  . Incontinence of feces 06/23/2008  . TOBACCO USE, QUIT 06/23/2008   PCP:  Celene Squibb, MD Pharmacy:   [facility] [place], [place] [address] [place] [address] Phone: [phone] Fax: [phone]  [place] [address] [place] - [place] [address] [place] [address] [place] [address] Phone: [phone] Fax: [phone]  [facility] [place], [place] [address] [place] [address] Phone: [phone] Fax: [phone]

## 2020-12-13 NOTE — ED Triage Notes (Signed)
Pt to the ED via POV from Dr. Juel Burrow office. Pt has been in bed for 4-5 days suffering from sleeplessness, anorexia, dehydration and cochlear implant pain.

## 2020-12-13 NOTE — Discharge Instructions (Addendum)
Follow up with your primary care provider. Take Keflex as prescribed and complete the full course.

## 2020-12-13 NOTE — ED Provider Notes (Signed)
[facility] EMERGENCY DEPARTMENT Provider Note   CSN: [id number] Arrival date & time: 12/13/20  1244     History Chief Complaint  Patient presents with  . Weakness    Whitney Galloway is a 87 y.o. female.  87 year old female with complaint of inability to sleep, staying in bed for the past 4-5 days, shortness of breath, brought in by daughter. Patient has a cochlear implant, is able to read lips. History provided primarily by patient's daughter who states she received a text message from the patient on Saturday that did not make sense. Daughter offered to come and check on her Saturday but patient declined. Daughter went to check on patient on Sunday (12/10/20), states patient was in bed and appeared to have been staying in bed for the past few days.  Patient has been able to ambulate to the bathroom however is not getting out of bed and going about her day per usual (states would normally go to sit on the sofa or playing on her iPad).  Patient told her daughter today that she is not been able to sleep and if she does not get some sleep she will kill herself.  Patient has also complained of feeling short of breath although does not appear to be in any distress. Patient has complained of pain from her cochlear implant which has been an ongoing problem for several years.  Patient states that she thinks she has a kidney infection although denies dysuria, frequency, abdominal pain or back pain.        Past Medical History:  Diagnosis Date  . Bowel incontinence   . Colon polyp 1994 Malignant polyp   2005 NL BE; 2006 incomplete TCS DUE TO REDUNDNACY  . Diverticulosis   . Fibromyalgia   . Gastritis   . Gastroparesis 2009   65% RETENTION AT 2 HOURS  . GERD (gastroesophageal reflux disease)   . Hard of hearing   . Hernia of unspecified site of abdominal cavity without mention of obstruction or gangrene   . Hiatal hernia   . HIATAL HERNIA 06/23/2008   Qualifier: Diagnosis of  By: Craige Cotta    . HTN (hypertension)   . IBS (irritable bowel syndrome)   . Osteoporosis   . S/P endoscopy 2007, 2009   2007: single distal esophageal erosion, s/p 80 F dilation, no web/ring/stricture noted; 2009: normal, Bravo with adequate suppression on Prevacid BID  . Splenic lesion HAMARTOMA  . TB (tuberculosis) AGE 18  . Wears dentures    top  . Wears glasses   . Wears hearing aid     Patient Active Problem List   Diagnosis Date Noted  . Acute encephalopathy 04/03/2020  . Altered mental status 04/02/2020  . Hypokalemia 04/02/2020  . Closed nondisplaced fracture of lateral malleolus of right fibula 09/27/2019  . Postop check 09/21/2014  . Breast cancer, 1971, status post right lumpectomy 01/04/2014  . Hemorrhoids, internal, with bleeding 10/14/2013  . Anemia 09/09/2013  . History of spinal surgery 12/17/2012  . Difficulty hearing 12/17/2012  . Herniated nucleus pulposus, L3-4 with stenosis and spondylolisthesis 10/20/2012  . Night sweats 12/26/2011  . Stiffness of joints, not elsewhere classified, multiple sites 12/19/2011  . Weakness of left leg 12/19/2011  . Difficulty in walking(719.7) 12/19/2011  . GASTROPARESIS 11/03/2009  . COLONIC POLYPS, ADENOMATOUS 06/23/2008  . UNSPECIFIED DISEASE OF SPLEEN 06/23/2008  . DEPRESSION/ANXIETY 06/23/2008  . Essential hypertension 06/23/2008  . ALLERGIC RHINITIS, SEASONAL 06/23/2008  . GASTROESOPHAGEAL REFLUX DISEASE,  CHRONIC 06/23/2008  . DIVERTICULOSIS OF COLON 06/23/2008  . Irritable bowel syndrome 06/23/2008  . ECZEMA 06/23/2008  . OSTEOARTHRITIS, SPINE 06/23/2008  . DEGENERATIVE DISC DISEASE 06/23/2008  . OTHER DYSPHAGIA 06/23/2008  . Incontinence of feces 06/23/2008  . TOBACCO USE, QUIT 06/23/2008    Past Surgical History:  Procedure Laterality Date  . BACK SURGERY  2014   lumbar lam  . Benign tumor removed from bilateral breast    . cataract surgery    . COCHLEAR IMPLANT Left 09/21/2014   Procedure: COCHLEAR IMPLANT  LEFT EAR;  Surgeon: Fannie Knee, MD;  Location: [facility];  Service: ENT;  Laterality: Left;  . COLONOSCOPY W/ POLYPECTOMY    . ESOPHAGOGASTRODUODENOSCOPY  08/27/2011   hiatal hernia/mild gastritis  . ESOPHAGOGASTRODUODENOSCOPY  05/25/2008    Normal esophagus without evidence of Barrett mass/  Normal stomach, duodenal bulb, and second portion of the duodenum  . ESOPHAGOGASTRODUODENOSCOPY  08/11/2006   Single erosion at distal esophagus and a small sliding hiatal hernia/ No evidence of ring or stricture but esophagus was dilated by passing 43 [place] Maloney dilator/ Prepyloric antral erythema but no evidence of erosions or peptic ulcer disease.  . ESOPHAGOGASTRODUODENOSCOPY  09/03/2005   Small sliding hiatal hernia without evidence of ring or  stricture formation. Esophagus dilated by passing 54-French Maloney dilator   given history of dysphagia  . GIVENS CAPSULE STUDY  05/27/2008   Adequate acid suppression with proton pump inhibitor.  [name] LEFT WRIST CYST REMOVAL    . LUNG SURGERY for pulmonary nodules    . Mass removed from roof of mouth    . SAVORY DILATION  08/27/2011   stricture in the distal esophagus  . UPPER GASTROINTESTINAL ENDOSCOPY  2006  . X-STOP IMPLANTATION       OB History   No obstetric history on file.     History reviewed. No pertinent family history.  Social History   Tobacco Use  . Smoking status: Former Smoker    Quit date: 09/16/2004    Years since quitting: 16.2  . Smokeless tobacco: Never Used  . Tobacco comment: Quit x 8 years  Substance Use Topics  . Alcohol use: No  . Drug use: No    Home Medications Prior to Admission medications   Medication Sig Start Date End Date Taking? Authorizing Provider  acetaminophen-codeine (TYLENOL #3) 300-30 MG tablet Take 1 tablet by mouth every 8 (eight) hours as needed for moderate pain. 04/04/20  Yes Kathie Dike, MD  amlodipine-atorvastatin (CADUET) 10-20 MG tablet Take 1 tablet by mouth daily.    Yes [provider]  b complex vitamins capsule Take 1 capsule by mouth daily.   Yes [provider]  calcium carbonate (TUMS - DOSED IN MG ELEMENTAL CALCIUM) 500 MG chewable tablet Chew 1 tablet by mouth as needed. gas   Yes [provider]  cephALEXin (KEFLEX) 500 MG capsule Take 1 capsule (500 mg total) by mouth 2 (two) times daily for 5 days. 12/13/20 12/18/20 Yes Tacy Learn, PA-C  dicyclomine (BENTYL) 10 MG capsule 1 po every other night followed by another dose in the morning. YOU MAY USE ONE AT 12 NOON IF NEEDED. 06/19/16  Yes Mahala Menghini, PA-C  LORazepam (ATIVAN) 1 MG tablet Take 1 mg by mouth at bedtime.    Yes [provider]  metoprolol tartrate (LOPRESSOR) 25 MG tablet Take 25 mg by mouth 2 (two) times daily.  03/16/20  Yes [provider]  Multiple Vitamin (  MULTIVITAMIN WITH MINERALS) TABS tablet Take 1 tablet by mouth daily.   Yes [provider]  Multiple Vitamins-Minerals (PRESERVISION AREDS PO) Take by mouth daily.   Yes [provider]  omeprazole (PRILOSEC) 20 MG capsule Take 1 capsule (20 mg total) by mouth 2 (two) times daily before a meal. 05/03/20  Yes Annitta Needs, NP  pantoprazole (PROTONIX) 40 MG tablet Take 40 mg by mouth 2 (two) times daily. 10/22/20  Yes [provider]  Polyethyl Glycol-Propyl Glycol (SYSTANE OP) Place 1 drop into both eyes daily.   Yes [provider]  polyethylene glycol (MIRALAX / GLYCOLAX) packet Take 17 g by mouth daily as needed.   Yes [provider]  Probiotic Product (PROBIOTIC DAILY PO) Take by mouth every other day.   Yes [provider]  Venlafaxine HCl 225 MG TB24 Take 225 mg by mouth daily.    Yes [provider]  albuterol (VENTOLIN HFA) 108 (90 Base) MCG/ACT inhaler Inhale into the lungs. Patient not taking: No sig reported    [provider]  conjugated estrogens (PREMARIN) vaginal cream Place 1 Applicatorful  vaginally daily. Patient not taking: No sig reported    [provider]  fexofenadine (ALLEGRA) 180 MG tablet Take 180 mg by mouth daily. Patient not taking: Reported on 12/13/2020    [provider]  fluconazole (DIFLUCAN) 100 MG tablet Take 1 tablet every other day Patient not taking: Reported on 12/13/2020 09/05/20   Florian Buff, MD  Nystatin (GERHARDT'S BUTT CREAM) CREA Apply 1 application topically 3 (three) times daily. Patient not taking: Reported on 12/13/2020 11/12/20   Florian Buff, MD    Allergies    Patient has no known allergies.  Review of Systems   Review of Systems  Constitutional: Negative for fever.  Eyes: Negative for visual disturbance.  Respiratory: Negative for shortness of breath.   Cardiovascular: Negative for chest pain.  Gastrointestinal: Negative for abdominal pain, constipation, diarrhea, nausea and vomiting.  Genitourinary: Negative for dysuria and frequency.  Musculoskeletal: Negative for arthralgias, back pain and myalgias.  Skin: Negative for rash and wound.  Allergic/Immunologic: Negative for immunocompromised state.  Neurological: Positive for headaches. Negative for speech difficulty and weakness.  All other systems reviewed and are negative.   Physical Exam Updated Vital Signs BP 106/84   Pulse (!) 56   Temp 97.6 F (36.4 C) (Oral)   Resp 19   Ht 5\' 8"  (1.727 m)   Wt 63.5 kg   SpO2 100%   BMI 21.29 kg/m   Physical Exam Vitals and nursing note reviewed.  Constitutional:      General: She is not in acute distress.    Appearance: She is well-developed and well-nourished. She is not diaphoretic.  HENT:     Head: Normocephalic and atraumatic.     Mouth/Throat:     Mouth: Mucous membranes are dry.  Eyes:     Conjunctiva/sclera: Conjunctivae normal.  Cardiovascular:     Rate and Rhythm: Normal rate and regular rhythm.     Pulses: Normal pulses.     Heart sounds: Normal heart sounds.  Pulmonary:     Effort: Pulmonary  effort is normal.     Breath sounds: Normal breath sounds.  Abdominal:     Palpations: Abdomen is soft.     Tenderness: There is no abdominal tenderness.  Musculoskeletal:     Cervical back: Neck supple.     Right lower leg: No edema.     Left  lower leg: No edema.  Skin:    General: Skin is warm and dry.     Findings: No erythema or rash.  Neurological:     Mental Status: She is alert and oriented to person, place, and time.     Sensory: No sensory deficit.     Motor: No weakness.  Psychiatric:        Mood and Affect: Mood and affect normal.        Behavior: Behavior normal.     ED Results / Procedures / Treatments   Labs (all labs ordered are listed, but only abnormal results are displayed) Labs Reviewed  COMPREHENSIVE METABOLIC PANEL - Abnormal; Notable for the following components:      Result Value   Glucose, Bld 109 (*)    BUN 24 (*)    All other components within normal limits  URINALYSIS, ROUTINE W REFLEX MICROSCOPIC - Abnormal; Notable for the following components:   APPearance HAZY (*)    Ketones, ur 80 (*)    Nitrite POSITIVE (*)    Bacteria, UA MANY (*)    All other components within normal limits  URINE CULTURE  CBC WITH DIFFERENTIAL/PLATELET    EKG EKG Interpretation  Date/Time:  Wednesday December 13 2020 13:27:14 EST Ventricular Rate:  56 PR Interval:    QRS Duration: 112 QT Interval:  443 QTC Calculation: 428 R Axis:   72 Text Interpretation: Sinus rhythm Borderline intraventricular conduction delay Borderline repolarization abnormality No significant change since last tracing Confirmed by Fredia Sorrow [id number]) on 12/13/2020 1:31:50 PM   Radiology CT Head Wo Contrast  Result Date: 12/13/2020 CLINICAL DATA:  TIA. EXAM: CT HEAD WITHOUT CONTRAST TECHNIQUE: Contiguous axial images were obtained from the base of the skull through the vertex without intravenous contrast. COMPARISON:  CT head 04/03/2020 FINDINGS: Brain: Mild atrophy. Mild white matter  hypodensity bilaterally unchanged and chronic. Cochlear implant on the left causes significant artifact. Negative for acute infarct, hemorrhage, mass Vascular: Negative for hyperdense vessel Skull: Negative Sinuses/Orbits: Left mastoidectomy and cochlear implant. Left mastoidectomy cavity clear. Right mastoid sinus clear. Paranasal sinuses clear. Negative orbit Other: None IMPRESSION: Mild atrophy and mild white matter changes consistent with chronic microvascular ischemia. No acute abnormality Left cochlear implant Electronically Signed   By: Franchot Gallo M.D.   On: 12/13/2020 16:58   DG Chest Port 1 View  Result Date: 12/13/2020 CLINICAL DATA:  Shortness of breath. EXAM: PORTABLE CHEST 1 VIEW COMPARISON:  04/02/2020 FINDINGS: The cardiomediastinal silhouette is unchanged with normal heart size. Postoperative changes are again noted in the right lung with volume loss and scarring, similar to the prior study. Mild left basilar opacity is stable to slightly increased in likely reflects atelectasis and/or scarring. There is also mild atelectasis in the right lung base. No pulmonary edema, pleural effusion, pneumothorax is identified. No acute osseous abnormality is seen. IMPRESSION: Postsurgical changes and mild bibasilar atelectasis and/or scarring. Electronically Signed   By: Logan Bores M.D.   On: 12/13/2020 14:52    Procedures Procedures   Medications Ordered in ED Medications  cefTRIAXone (ROCEPHIN) 1 g in sodium chloride 0.9 % 100 mL IVPB (1 g Intravenous New Bag/Given 12/13/20 1840)  sodium chloride 0.9 % bolus 500 mL (0 mLs Intravenous Stopped 12/13/20 1608)    ED Course  I have reviewed the triage vital signs and the nursing notes.  Pertinent labs & imaging results that were available during my care of the patient were reviewed by me and considered in my  medical decision making (see chart for details).  Clinical Course as of 12/13/20 1841  Wed Dec 14, 5710  [id number] 87 year old female brought  in by daughter from home as above.  On exam, patient is well-appearing, moves extremities equally without loss in sensation or unilateral weakness. CT head without acute findings.  CBC within normal limits, CMP without significant changes.  Urinalysis positive for UTI with nitrites, many bacteria and rare epithelials. Plan is to treat with Rocephin in the ER, will send Keflex to patient's pharmacy. Gust results and plan of care with patient's daughter at bedside, patient was previously prescribed Macrobid and Cipro in June 2021 leading to an admission for encephalopathy.  9 is to recheck with PCP tomorrow regarding medication to help with patient's sleep.  Return to ED for any worsening or concerning symptoms. [LM]  [phone] Daughter requests assistance with home health, face-to-face completed. [LM]    Clinical Course User Index [LM] Roque Lias   MDM Rules/Calculators/A&P                          Final Clinical Impression(s) / ED Diagnoses Final diagnoses:  Generalized weakness  Insomnia, unspecified type  Urinary tract infection in female    Rx / DC Orders ED Discharge Orders         Ordered    cefTRIAXone (ROCEPHIN) 1 g injection   Once,   Status:  Discontinued        12/13/20 1822    cephALEXin (KEFLEX) 500 MG capsule  2 times daily        12/13/20 1838           Tacy Learn, PA-C 12/13/20 Rex Kras, MD 12/14/20 [phone]

## 2020-12-16 LAB — URINE CULTURE: Culture: >=100000 — AB

## 2020-12-25 ENCOUNTER — Ambulatory Visit: Payer: Medicare HMO | Admitting: Gastroenterology

## 2021-02-03 NOTE — Progress Notes (Signed)
Primary Care Physician:  Celene Squibb, MD Primary Gastroenterologist:  Dr. Abbey Chatters  Chief Complaint  Patient presents with  . Constipation    Occ, takes Miralax  . Gastroesophageal Reflux    Sometimes keeps her up at night  . fecal incontinence    Since had back surgery    HPI:   Whitney Galloway is a 87 y.o. female presenting today for follow-up.  History of IBS with constipation and occasional diarrhea and history of fecal incontinence, gastroparesis, and GERD.  Last seen in our office October 2019.  She was using Bentyl as needed for abdominal cramping.  She had occasional diarrhea and fecal incontinence.  Constipation improved with MiraLAX.  Bowel movements range from 1 to 4/day.  She would get hungry in the middle of the night and lay down and get heartburn.  She was on omeprazole 20 mg twice daily.  She declined Maalox, Mylanta, or viscous lidocaine.  Advised to continue to monitor GERD symptoms.  Continue using MiraLAX as needed.  Today:  Presents today with her daughter.  Notably, patient is very hard of hearing.  GERD: Taking omeprazole 20 mg twice daily and Pantoprazole 40 mg twice daily.  She did not realize needs medications or in the same class.  GERD is fairly well controlled.  Occasional reflux symptoms at night, but not routinely.  She uses Tums as needed for breakthrough symptoms which works well.  Notably, she does drink coffee routinely including at night and eats frozen meals frequently as she does not cook much.  Daughter states that she likes to eat junk food/snack.  Denies nausea, vomiting, or dysphagia.  Intermittent constipation or diarrhea. Takes MiraLAX as needed which works well. Dicyclomine once a month or less for occasional abdominal cramping. No blood in the stools or black stools.  In general, IBS symptoms are at baseline.  Continues with fecal incontinence. Leakage rather than full BMs. Wears panty liners.  Past Medical History:  Diagnosis Date  .  Bowel incontinence   . Colon polyp 1994 Malignant polyp   2005 NL BE; 2006 incomplete TCS DUE TO REDUNDNACY  . Diverticulosis   . Fibromyalgia   . Gastritis   . Gastroparesis 2009   65% RETENTION AT 2 HOURS  . GERD (gastroesophageal reflux disease)   . Hard of hearing   . Hernia of unspecified site of abdominal cavity without mention of obstruction or gangrene   . Hiatal hernia   . HIATAL HERNIA 06/23/2008   Qualifier: Diagnosis of  By: Craige Cotta    . HTN (hypertension)   . IBS (irritable bowel syndrome)   . Osteoporosis   . S/P endoscopy 2007, 2009   2007: single distal esophageal erosion, s/p 32 F dilation, no web/ring/stricture noted; 2009: normal, Bravo with adequate suppression on Prevacid BID  . Splenic lesion HAMARTOMA  . TB (tuberculosis) AGE 51  . Wears dentures    top  . Wears glasses   . Wears hearing aid     Past Surgical History:  Procedure Laterality Date  . BACK SURGERY  2014   lumbar lam  . Benign tumor removed from bilateral breast    . cataract surgery    . COCHLEAR IMPLANT Left 09/21/2014   Procedure: COCHLEAR IMPLANT LEFT EAR;  Surgeon: Fannie Knee, MD;  Location: [facility];  Service: ENT;  Laterality: Left;  . COLONOSCOPY W/ POLYPECTOMY    . ESOPHAGOGASTRODUODENOSCOPY  08/27/2011   hiatal hernia/mild gastritis  . ESOPHAGOGASTRODUODENOSCOPY  05/25/2008    Normal esophagus without evidence of Barrett mass/  Normal stomach, duodenal bulb, and second portion of the duodenum  . ESOPHAGOGASTRODUODENOSCOPY  08/11/2006   Single erosion at distal esophagus and a small sliding hiatal hernia/ No evidence of ring or stricture but esophagus was dilated by passing 67 [place] Maloney dilator/ Prepyloric antral erythema but no evidence of erosions or peptic ulcer disease.  . ESOPHAGOGASTRODUODENOSCOPY  09/03/2005   Small sliding hiatal hernia without evidence of ring or  stricture formation. Esophagus dilated by passing 54-French Maloney dilator    given history of dysphagia  . GIVENS CAPSULE STUDY  05/27/2008   Adequate acid suppression with proton pump inhibitor.  [name] LEFT WRIST CYST REMOVAL    . LUNG SURGERY for pulmonary nodules    . Mass removed from roof of mouth    . SAVORY DILATION  08/27/2011   stricture in the distal esophagus  . UPPER GASTROINTESTINAL ENDOSCOPY  2006  . X-STOP IMPLANTATION      Current Outpatient Medications  Medication Sig Dispense Refill  . acetaminophen-codeine (TYLENOL #3) 300-30 MG tablet Take 1 tablet by mouth every 8 (eight) hours as needed for moderate pain. 15 tablet 0  . amlodipine-atorvastatin (CADUET) 10-20 MG tablet Take 1 tablet by mouth daily.    [name] b complex vitamins capsule Take 1 capsule by mouth daily.    [name] buPROPion (WELLBUTRIN XL) 150 MG 24 hr tablet Take 1 tablet by mouth every morning.    . calcium carbonate (TUMS - DOSED IN MG ELEMENTAL CALCIUM) 500 MG chewable tablet Chew 1 tablet by mouth as needed. gas    . Carboxymethylcellulose Sodium (THERATEARS OP) Apply to eye 2 (two) times daily.    [name] dicyclomine (BENTYL) 10 MG capsule 1 po every other night followed by another dose in the morning. YOU MAY USE ONE AT 12 NOON IF NEEDED. (Patient taking differently: as needed.) 90 capsule 11  . LORazepam (ATIVAN) 1 MG tablet Take 1 mg by mouth at bedtime.     . metoprolol tartrate (LOPRESSOR) 25 MG tablet Take 25 mg by mouth 2 (two) times daily.     . Multiple Vitamin (MULTIVITAMIN WITH MINERALS) TABS tablet Take 1 tablet by mouth daily.    . Multiple Vitamins-Minerals (PRESERVISION AREDS PO) Take by mouth daily.    . pantoprazole (PROTONIX) 40 MG tablet Take 40 mg by mouth 2 (two) times daily.    . polyethylene glycol (MIRALAX / GLYCOLAX) packet Take 17 g by mouth daily as needed. Takes every 2-3 days    . Probiotic Product (PROBIOTIC DAILY PO) Take by mouth every other day.    . Venlafaxine HCl 225 MG TB24 Take 225 mg by mouth daily.     [name] albuterol (VENTOLIN HFA) 108 (90 Base) MCG/ACT  inhaler Inhale into the lungs. (Patient not taking: No sig reported)     No current facility-administered medications for this visit.    Allergies as of 02/05/2021  . (No Known Allergies)    Family History  Problem Relation Age of Onset  . Colon cancer Neg Hx     Social History   Socioeconomic History  . Marital status: Widowed    Spouse name: Not on file  . Number of children: Not on file  . Years of education: Not on file  . Highest education level: Not on file  Occupational History  . Not on file  Tobacco Use  . Smoking status: Former Smoker    Quit date: 09/16/2004  Years since quitting: 16.4  . Smokeless tobacco: Never Used  . Tobacco comment: Quit x 8 years  Substance and Sexual Activity  . Alcohol use: No  . Drug use: No  . Sexual activity: Never  Other Topics Concern  . Not on file  Social History Narrative  . Not on file   Social Determinants of Health   Financial Resource Strain: Low Risk   . Difficulty of Paying Living Expenses: Not hard at all  Food Insecurity: No Food Insecurity  . Worried About Charity fundraiser in the Last Year: Never true  . Ran Out of Food in the Last Year: Never true  Transportation Needs: No Transportation Needs  . Lack of Transportation (Medical): No  . Lack of Transportation (Non-Medical): No  Physical Activity: Inactive  . Days of Exercise per Week: 0 days  . Minutes of Exercise per Session: 0 min  Stress: Stress Concern Present  . Feeling of Stress : To some extent  Social Connections: Moderately Isolated  . Frequency of Communication with Friends and Family: Three times a week  . Frequency of Social Gatherings with Friends and Family: Three times a week  . Attends Religious Services: Never  . Active Member of Clubs or Organizations: Yes  . Attends Archivist Meetings: Never  . Marital Status: Widowed  Intimate Partner Violence: Not At Risk  . Fear of Current or Ex-Partner: No  . Emotionally Abused:  No  . Physically Abused: No  . Sexually Abused: No    Review of Systems: Gen: Denies any fever, chills, presyncope, syncope. GI: See HPI GU : Denies urinary burning, urinary frequency, urinary hesitancy Derm: Admits to a rash in the groin/perineal area which is being addressed by OBGYN.  Heme: See HPI  Physical Exam: BP 114/63   Pulse (!) 56   Temp (!) 97.1 F (36.2 C) (Temporal)   Ht 5\' 8"  (1.727 m)   Wt 148 lb (67.1 kg)   BMI 22.50 kg/m  General:   Alert and oriented. Pleasant and cooperative. Well-nourished and well-developed.  Head:  Normocephalic and atraumatic. Eyes:  Without icterus, sclera clear and conjunctiva pink.  Ears: Severely impaired auditory acuity. Lungs:  Clear to auscultation bilaterally. No wheezes, rales, or rhonchi. No distress.  Heart:  S1, S2 present without murmurs appreciated.  Abdomen:  +BS, soft, non-tender and non-distended. No HSM noted. No guarding or rebound. No masses appreciated.  Rectal:  Deferred  Msk:  Symmetrical without gross deformities. Normal posture. Extremities:  Without edema. Neurologic:  Alert and  oriented x4;  grossly normal neurologically. Skin:  Intact without significant lesions or rashes. Psych: Normal mood and affect.   Assessment: 87 year old female with history of IBS with alternating constipation and diarrhea, fecal incontinence, gastroparesis, and GERD.  She is presenting today for follow-up.   GERD: Fairly well controlled.  Occasional breakthrough symptoms at night, likely secondary to diet/lifestyle. No alarm symptoms.  Currently taking omeprazole 20 mg twice daily and pantoprazole 40 mg twice daily as she did not realize any medications were essentially the same.   We will plan to stop omeprazole and continue Protonix 40 mg twice daily for now.  IBS-mixed: Chronic.  Uses MiraLAX as needed for constipation which works well.  Fairly rare abdominal cramping for which she will take dicyclomine.  Occasional loose  stools.  In general, symptoms are at baseline.  No alarm symptoms.  In the setting of chronic fecal incontinence which presents as fecal leakage rather than full  bowel movements, recommended adding Benefiber to help with stool consistency.  Otherwise, she will continue her current medications as needed.  Plan: 1.  Stop omeprazole. 2.  Continue Protonix 40 mg twice daily 30 minutes before breakfast and dinner. 3.  Continue Tums as needed. 4.  Reinforced GERD diet/lifestyle.  Handout provided. 5.  Start Benefiber 2 teaspoons daily x2 weeks.  May increase up to 3 times daily as tolerated.  If Benefiber worsens her stools, okay to stop. 6.  Continue MiraLAX and dicyclomine as needed. 7.  Follow-up in 3 months.  Advised to call with questions or concerns prior.     Aliene Altes, PA-C [facility] 02/05/2021

## 2021-02-05 ENCOUNTER — Other Ambulatory Visit: Payer: Self-pay

## 2021-02-05 ENCOUNTER — Ambulatory Visit (INDEPENDENT_AMBULATORY_CARE_PROVIDER_SITE_OTHER): Payer: Medicare HMO | Admitting: Gastroenterology

## 2021-02-05 ENCOUNTER — Encounter: Payer: Self-pay | Admitting: Gastroenterology

## 2021-02-05 VITALS — BP 114/63 | HR 56 | Temp 97.1°F | Ht 68.0 in | Wt 148.0 lb

## 2021-02-05 DIAGNOSIS — K219 Gastro-esophageal reflux disease without esophagitis: Secondary | ICD-10-CM | POA: Diagnosis not present

## 2021-02-05 DIAGNOSIS — R159 Full incontinence of feces: Secondary | ICD-10-CM

## 2021-02-05 DIAGNOSIS — K582 Mixed irritable bowel syndrome: Secondary | ICD-10-CM | POA: Diagnosis not present

## 2021-02-05 NOTE — Patient Instructions (Addendum)
Continue pantoprazole 40 mg twice daily 30 minutes before breakfast and dinner to control acid reflux.   Stop omeprazole as this is in the same class of medication as pantoprazole.  You may continue using Tums as needed for breakthrough reflux.  Follow a GERD diet:  Avoid fried, fatty, greasy, spicy, citrus foods. Avoid caffeine and carbonated beverages. Avoid chocolate. Try eating 4-6 small meals a day rather than 3 large meals. Do not eat within 3 hours of laying down. Prop head of bed up on wood or bricks to create a 6 inch incline.  Start Benefiber 2 teaspoons daily x2 weeks.  You may increase this up to 3 times daily as tolerated.  This is to help with bowel consistency.  Hopefully, this will decrease some of your fecal incontinence.  If this causes worsening loose stools, you may stop.  We will plan to see you back in 3 months.  Do not hesitate to call if you have any questions or concerns prior.   Aliene Altes, PA-C [facility]   Food Choices for Gastroesophageal Reflux Disease, Adult When you have gastroesophageal reflux disease (GERD), the foods you eat and your eating habits are very important. Choosing the right foods can help ease your discomfort. Think about working with a food expert (dietitian) to help you make good choices. What are tips for following this plan? Reading food labels  Look for foods that are low in saturated fat. Foods that may help with your symptoms include: ? Foods that have less than 5% of daily value (DV) of fat. ? Foods that have 0 grams of trans fat. Cooking  Do not fry your food.  Cook your food by baking, steaming, grilling, or broiling. These are all methods that do not need a lot of fat for cooking.  To add flavor, try to use herbs that are low in spice and acidity. Meal planning  Choose healthy foods that are low in fat, such as: ? Fruits and vegetables. ? Whole grains. ? Low-fat dairy products. ? Lean meats,  fish, and poultry.  Eat small meals often instead of eating 3 large meals each day. Eat your meals slowly in a place where you are relaxed. Avoid bending over or lying down until 2-3 hours after eating.  Limit high-fat foods such as fatty meats or fried foods.  Limit your intake of fatty foods, such as oils, butter, and shortening.  Avoid the following as told by your doctor: ? Foods that cause symptoms. These may be different for different people. Keep a food diary to keep track of foods that cause symptoms. ? Alcohol. ? Drinking a lot of liquid with meals. ? Eating meals during the 2-3 hours before bed.   Lifestyle  Stay at a healthy weight. Ask your doctor what weight is healthy for you. If you need to lose weight, work with your doctor to do so safely.  Exercise for at least 30 minutes on 5 or more days each week, or as told by your doctor.  Wear loose-fitting clothes.  Do not smoke or use any products that contain nicotine or tobacco. If you need help quitting, ask your doctor.  Sleep with the head of your bed higher than your feet. Use a wedge under the mattress or blocks under the bed frame to raise the head of the bed.  Chew sugar-free gum after meals. What foods should eat? Eat a healthy, well-balanced diet of fruits, vegetables, whole grains, low-fat dairy products, lean meats, fish,  and poultry. Each person is different. Foods that may cause symptoms in one person may not cause any symptoms in another person. Work with your doctor to find foods that are safe for you. The items listed above may not be a complete list of what you can eat and drink. Contact a food expert for more options.   What foods should I avoid? Limiting some of these foods may help in managing the symptoms of GERD. Everyone is different. Talk with a food expert or your doctor to help you find the exact foods to avoid, if any. Fruits Any fruits prepared with added fat. Any fruits that cause symptoms. For  some people, this may include citrus fruits, such as oranges, grapefruit, pineapple, and lemons. Vegetables Deep-fried vegetables. [place] fries. Any vegetables prepared with added fat. Any vegetables that cause symptoms. For some people, this may include tomatoes and tomato products, chili peppers, onions and garlic, and horseradish. Grains Pastries or quick breads with added fat. Meats and other proteins High-fat meats, such as fatty beef or pork, hot dogs, ribs, ham, sausage, salami, and bacon. Fried meat or protein, including fried fish and fried chicken. Nuts and nut butters, in large amounts. Dairy Whole milk and chocolate milk. Sour cream. Cream. Ice cream. Cream cheese. Milkshakes. Fats and oils Butter. Margarine. Shortening. Ghee. Beverages Coffee and tea, with or without caffeine. Carbonated beverages. Sodas. Energy drinks. Fruit juice made with acidic fruits, such as orange or grapefruit. Tomato juice. Alcoholic drinks. Sweets and desserts Chocolate and cocoa. Donuts. Seasonings and condiments Pepper. Peppermint and spearmint. Added salt. Any condiments, herbs, or seasonings that cause symptoms. For some people, this may include curry, hot sauce, or vinegar-based salad dressings. The items listed above may not be a complete list of what you should not eat and drink. Contact a food expert for more options. Questions to ask your doctor Diet and lifestyle changes are often the first steps that are taken to manage symptoms of GERD. If diet and lifestyle changes do not help, talk with your doctor about taking medicines. Where to find more information  International Foundation for Gastrointestinal Disorders: aboutgerd.org Summary  When you have GERD, food and lifestyle choices are very important in easing your symptoms.  Eat small meals often instead of 3 large meals a day. Eat your meals slowly and in a place where you are relaxed.  Avoid bending over or lying down until 2-3 hours  after eating.  Limit high-fat foods such as fatty meats or fried foods. This information is not intended to replace advice given to you by your health care provider. Make sure you discuss any questions you have with your health care provider. Document Revised: 04/03/2020 Document Reviewed: 04/03/2020 Elsevier Patient Education  [place].

## 2021-05-08 NOTE — Progress Notes (Signed)
Referring Provider: Celene Squibb, MD Primary Care Physician:  Celene Squibb, MD Primary GI Physician: Dr. Abbey Chatters  Chief Complaint  Patient presents with   Follow-up    FU Whitney Galloway    HPI:   Whitney Galloway is a 87 y.o. female with history of IBS with constipation and occasional diarrhea, chronic intermittent fecal incontinence s/p back surgery years ago, gastroparesis, GERD presenting today for follow-up.  Last seen in our office 02/05/2021 for GERD, IBS mixed type, and fecal incontinence.  GERD was well controlled though she was taking omeprazole 20 mg twice daily and pantoprazole 40 mg twice daily.  Noted occasional reflux symptoms at night, but not routinely.  She was eating frozen meals, junk food/snack food, and drinking coffee routinely at night.  No alarm symptoms.  Using MiraLAX as needed for constipation which was working well.  Dicyclomine about once a month or less for occasional abdominal cramping.  In general, IBS symptoms at baseline.  No alarm symptoms.  Continued with intermittent stool leakage using panty liners.  Plan to stop omeprazole, continue Protonix 40 Twice daily, reinforced GERD diet/lifestyle, continue MiraLAX and dicyclomine as needed, add Benefiber to help with stool consistency/fecal leakage.    Today: Presents today with her daughter.  Patient is hard of hearing.  She does live by herself.  GERD remains fairly well controlled on Protonix 40 mg twice daily.  Breakthrough symptoms at night when snacking late at night.  She also drinks coffee late at night.  She will take a Tums as needed which works well for her.  Denies dysphagia, nausea, vomiting, abdominal pain.  Bowels are moving fairly well with MiraLAX daily.  She does feel bowels are incomplete with about 2-3 bowel movements per day.  She tried Benefiber, but did not feel it was helpful.  Notably, she stopped MiraLAX when she started Benefiber.  No diarrhea recently.  Fairly rare abdominal pain.  If this  occurs, she will take dicyclomine which helps.  Weight is fairly stable.  Past Medical History:  Diagnosis Date   Bowel incontinence    Colon polyp 1994 Malignant polyp   2005 NL BE; 2006 incomplete TCS DUE TO REDUNDNACY   Diverticulosis    Fibromyalgia    Gastritis    Gastroparesis 2009   65% RETENTION AT 2 HOURS   GERD (gastroesophageal reflux disease)    Hard of hearing    Hernia of unspecified site of abdominal cavity without mention of obstruction or gangrene    Hiatal hernia    HIATAL HERNIA 06/23/2008   Qualifier: Diagnosis of  By: Craige Cotta     HTN (hypertension)    IBS (irritable bowel syndrome)    Osteoporosis    S/P endoscopy 2007, 2009   2007: single distal esophageal erosion, s/p 56 F dilation, no web/ring/stricture noted; 2009: normal, Bravo with adequate suppression on Prevacid BID   Splenic lesion HAMARTOMA   TB (tuberculosis) AGE 36   Wears dentures    top   Wears glasses    Wears hearing aid     Past Surgical History:  Procedure Laterality Date   BACK SURGERY  2014   lumbar lam   Benign tumor removed from bilateral breast     cataract surgery     COCHLEAR IMPLANT Left 09/21/2014   Procedure: COCHLEAR IMPLANT LEFT EAR;  Surgeon: Fannie Knee, MD;  Location: [place];  Service: ENT;  Laterality: Left;   COLONOSCOPY W/ POLYPECTOMY  ESOPHAGOGASTRODUODENOSCOPY  08/27/2011   hiatal hernia/mild gastritis   ESOPHAGOGASTRODUODENOSCOPY  05/25/2008    Normal esophagus without evidence of Barrett mass/  Normal stomach, duodenal bulb, and second portion of the duodenum   ESOPHAGOGASTRODUODENOSCOPY  08/11/2006   Single erosion at distal esophagus and a small sliding hiatal hernia/ No evidence of ring or stricture but esophagus was dilated by passing 46 [place] Maloney dilator/ Prepyloric antral erythema but no evidence of erosions or peptic ulcer disease.   ESOPHAGOGASTRODUODENOSCOPY  09/03/2005   Small sliding hiatal hernia without evidence  of ring or  stricture formation. Esophagus dilated by passing 54-French Maloney dilator   given history of dysphagia   GIVENS CAPSULE STUDY  05/27/2008   Adequate acid suppression with proton pump inhibitor.   LEFT WRIST CYST REMOVAL     LUNG SURGERY for pulmonary nodules     Mass removed from roof of mouth     SAVORY DILATION  08/27/2011   stricture in the distal esophagus   UPPER GASTROINTESTINAL ENDOSCOPY  2006   X-STOP IMPLANTATION      Current Outpatient Medications  Medication Sig Dispense Refill   acetaminophen-codeine (TYLENOL #3) 300-30 MG tablet Take 1 tablet by mouth every 8 (eight) hours as needed for moderate pain. (Patient taking differently: Take 1 tablet by mouth as needed for moderate pain.) 15 tablet 0   amlodipine-atorvastatin (CADUET) 10-20 MG tablet Take 1 tablet by mouth daily.     b complex vitamins capsule Take 1 capsule by mouth daily.     buPROPion (WELLBUTRIN XL) 150 MG 24 hr tablet Take 1 tablet by mouth every morning.     calcium carbonate (TUMS - DOSED IN MG ELEMENTAL CALCIUM) 500 MG chewable tablet Chew 1 tablet by mouth as needed. gas     Carboxymethylcellulose Sodium (THERATEARS OP) Apply to eye 2 (two) times daily.     dicyclomine (BENTYL) 10 MG capsule 1 po every other night followed by another dose in the morning. YOU MAY USE ONE AT 12 NOON IF NEEDED. (Patient taking differently: as needed.) 90 capsule 11   LORazepam (ATIVAN) 1 MG tablet Take 1 mg by mouth at bedtime.      metoprolol tartrate (LOPRESSOR) 25 MG tablet Take 25 mg by mouth 2 (two) times daily.      Multiple Vitamin (MULTIVITAMIN WITH MINERALS) TABS tablet Take 1 tablet by mouth daily.     Multiple Vitamins-Minerals (PRESERVISION AREDS PO) Take by mouth daily.     pantoprazole (PROTONIX) 40 MG tablet Take 40 mg by mouth 2 (two) times daily.     polyethylene glycol (MIRALAX / GLYCOLAX) packet Take 17 g by mouth daily as needed. Takes every 2-3 days     Probiotic Product (PROBIOTIC DAILY PO)  Take by mouth every other day.     Venlafaxine HCl 225 MG TB24 Take 225 mg by mouth daily.      albuterol (VENTOLIN HFA) 108 (90 Base) MCG/ACT inhaler Inhale into the lungs. (Patient not taking: No sig reported)     No current facility-administered medications for this visit.    Allergies as of 05/09/2021   (No Known Allergies)    Family History  Problem Relation Age of Onset   Colon cancer Neg Hx     Social History   Socioeconomic History   Marital status: Widowed    Spouse name: Not on file   Number of children: Not on file   Years of education: Not on file   Highest education level: Not on  file  Occupational History   Not on file  Tobacco Use   Smoking status: Former    Types: Cigarettes    Quit date: 09/16/2004    Years since quitting: 16.6   Smokeless tobacco: Never   Tobacco comments:    Quit x 8 years  Substance and Sexual Activity   Alcohol use: No   Drug use: No   Sexual activity: Never  Other Topics Concern   Not on file  Social History Narrative   Not on file   Social Determinants of Health   Financial Resource Strain: Low Risk    Difficulty of Paying Living Expenses: Not hard at all  Food Insecurity: No Food Insecurity   Worried About Charity fundraiser in the Last Year: Never true   [place] in the Last Year: Never true  Transportation Needs: No Transportation Needs   Lack of Transportation (Medical): No   Lack of Transportation (Non-Medical): No  Physical Activity: Inactive   Days of Exercise per Week: 0 days   Minutes of Exercise per Session: 0 min  Stress: Stress Concern Present   Feeling of Stress : To some extent  Social Connections: Moderately Isolated   Frequency of Communication with Friends and Family: Three times a week   Frequency of Social Gatherings with Friends and Family: Three times a week   Attends Religious Services: Never   Active Member of Clubs or Organizations: Yes   Attends Archivist Meetings: Never    Marital Status: Widowed    Review of Systems: Gen: Denies fever, chills, cold or flulike symptoms, presyncope, syncope. CV: Denies chest pain, palpitations Resp: Denies dyspnea at rest, cough. GI: See HPI Heme: See HPI.  Physical Exam: BP (!) 110/59   Pulse (!) 59   Temp 97.7 F (36.5 C) (Temporal)   Ht '5\' 8"'$  (1.727 m)   Wt 145 lb 6.4 oz (66 kg)   BMI 22.11 kg/m  General:   Alert and oriented. No distress noted. Pleasant and cooperative.  Walks with a cane. Head:  Normocephalic and atraumatic. Eyes:  Conjuctiva clear without scleral icterus. Heart:  S1, S2 present without murmurs appreciated. Lungs:  Clear to auscultation bilaterally. No wheezes, rales, or rhonchi. No distress.  Abdomen:  +BS, soft, non-tender and non-distended. No rebound or guarding. No HSM or masses noted. Msk:  Symmetrical without gross deformities. Normal posture. Extremities:  Without edema. Neurologic:  Alert and  oriented x4 Psych:  Normal mood and affect.   Assessment: 87 year old female with history of IBS with constipation and occasional diarrhea, chronic intermittent fecal incontinence s/p back surgery years ago, gastroparesis, GERD presenting today for follow-up.  GERD: Fairly well controlled on Protonix 40 mg twice daily.  Breakthrough nocturnal symptoms in the setting of late night snacking and drinking coffee at night.  Uses Tums for breakthrough symptoms which works well.  No alarm symptoms.  IBS with constipation and occasional diarrhea: Bowels are moving fairly well, though she reports incomplete bowel movements with a total of 2-3 BMs per day using 1 capful of MiraLAX daily.  Tried Benefiber, but did not realize she was supposed to continue MiraLAX with this, so she stopped Benefiber and resume MiraLAX.  No diarrhea recently.  Fairly rare abdominal pain which is improved by Bentyl. No alarm symptoms.   Plan:  1.  Continue Protonix 40 mg twice daily. 2.  Counseled on limiting late night  snacking and coffee late in the evening. 3.  Continue Tums  as needed. 4.  Continue MiraLAX 17 g daily. 5.  Add Benefiber 2 teaspoons daily x2 weeks and increase to twice daily. 6.  Advised to let me know if adding Benefiber is not helpful with producing more complete bowel movements and we can try something different. 7.  Follow-up in 6 months.    Aliene Altes, PA-C [facility] 05/09/2021

## 2021-05-09 ENCOUNTER — Ambulatory Visit (INDEPENDENT_AMBULATORY_CARE_PROVIDER_SITE_OTHER): Payer: Medicare HMO | Admitting: Gastroenterology

## 2021-05-09 ENCOUNTER — Other Ambulatory Visit: Payer: Self-pay

## 2021-05-09 ENCOUNTER — Encounter: Payer: Self-pay | Admitting: Gastroenterology

## 2021-05-09 VITALS — BP 110/59 | HR 59 | Temp 97.7°F | Ht 68.0 in | Wt 145.4 lb

## 2021-05-09 DIAGNOSIS — K582 Mixed irritable bowel syndrome: Secondary | ICD-10-CM

## 2021-05-09 DIAGNOSIS — K219 Gastro-esophageal reflux disease without esophagitis: Secondary | ICD-10-CM | POA: Diagnosis not present

## 2021-05-09 NOTE — Patient Instructions (Signed)
For acid reflux: Continue pantoprazole 40 mg twice daily 30 minutes before breakfast and dinner. As we discussed, your breakthrough reflux symptoms at night are likely secondary to snacking late at night and drinking coffee.  Try to limit this is much as possible. You may continue to use Tums as needed for breakthrough symptoms.  For constipation: Continue MiraLAX 1 capful (17 g) daily in 8 ounces of water. Add Benefiber 2 teaspoons daily x2 weeks then increase to twice daily. If the addition of Benefiber does not help with having complete bowel movements, please let me know and we can try something different.  We will plan to see you back in 6 months.  Do not hesitate to call if you have questions or concerns prior to your next visit.  It was great to see you today!  Aliene Altes, PA-C [facility]

## 2021-09-20 ENCOUNTER — Encounter: Payer: Self-pay | Admitting: Gastroenterology

## 2021-11-19 ENCOUNTER — Ambulatory Visit: Payer: Medicare HMO | Admitting: Gastroenterology

## 2022-01-21 ENCOUNTER — Ambulatory Visit: Payer: Medicare HMO | Admitting: Gastroenterology

## 2022-01-21 NOTE — Progress Notes (Deleted)
? ?GI Office Note   ? ?Referring Provider: Celene Squibb, MD ?Primary Care Physician:  Celene Squibb, MD ?Primary GI: Dr. Abbey Chatters ? ?Date:  01/21/2022  ?ID:  Whitney Galloway, DOB November 03, 1928, MRN [id number] ? ? ?Chief Complaint  ? ?No chief complaint on file. ? ? ? ?History of Present Illness  ?[name] is a 88 y.o. female with a history of hiatal hernia, IBS with constipation and occasional diarrhea, chronic intermittent fecal incontinence s/p back surgery,  GERD, gastroparesis, fibromyalgia, and colon polyps presenting today for follow up on GERD and IBS. ? ? ?Last OV 05/09/21 - GERD remains fairly well controlled on protonix 40 mg BID with breakthrough symptoms at night when snacking late. Was also drinking coffeee at night and occasionally took Tums as needed. She was having about 2-3 bowel movements per day and incomplete defecation. Rare abdominal pain (dicyclomine effective), had tried benefiber which felt was ineffective but stopped miralax when she started the fiber. Was instructed to continue PPI Bid, miralax daily, and add benefiber with goal to increase to twice daily.  ? ? ?GERD ? ? ?IBS ? ? ? ?Past Medical History:  ?Diagnosis Date  ? Bowel incontinence   ? Colon polyp 1994 Malignant polyp  ? 2005 NL BE; 2006 incomplete TCS DUE TO REDUNDNACY  ? Diverticulosis   ? Fibromyalgia   ? Gastritis   ? Gastroparesis 2009  ? 65% RETENTION AT 2 HOURS  ? GERD (gastroesophageal reflux disease)   ? Hard of hearing   ? Hernia of unspecified site of abdominal cavity without mention of obstruction or gangrene   ? Hiatal hernia   ? HIATAL HERNIA 06/23/2008  ? Qualifier: Diagnosis of  By: Craige Cotta    ? HTN (hypertension)   ? IBS (irritable bowel syndrome)   ? Osteoporosis   ? S/P endoscopy 2007, 2009  ? 2007: single distal esophageal erosion, s/p 78 F dilation, no web/ring/stricture noted; 2009: normal, Bravo with adequate suppression on Prevacid BID  ? Splenic lesion HAMARTOMA  ? TB (tuberculosis) AGE 88  ? Wears  dentures   ? top  ? Wears glasses   ? Wears hearing aid   ? ? ?Past Surgical History:  ?Procedure Laterality Date  ? BACK SURGERY  2014  ? lumbar lam  ? Benign tumor removed from bilateral breast    ? cataract surgery    ? COCHLEAR IMPLANT Left 09/21/2014  ? Procedure: COCHLEAR IMPLANT LEFT EAR;  Surgeon: Fannie Knee, MD;  Location: [place];  Service: ENT;  Laterality: Left;  ? COLONOSCOPY W/ POLYPECTOMY    ? ESOPHAGOGASTRODUODENOSCOPY  08/27/2011  ? hiatal hernia/mild gastritis  ? ESOPHAGOGASTRODUODENOSCOPY  05/25/2008  ?  Normal esophagus without evidence of Barrett mass/  Normal stomach, duodenal bulb, and second portion of the duodenum  ? ESOPHAGOGASTRODUODENOSCOPY  08/11/2006  ? Single erosion at distal esophagus and a small sliding hiatal hernia/ No evidence of ring or stricture but esophagus was dilated by passing 92 [place] Maloney dilator/ Prepyloric antral erythema but no evidence of erosions or peptic ulcer disease.  ? ESOPHAGOGASTRODUODENOSCOPY  09/03/2005  ? Small sliding hiatal hernia without evidence of ring or  stricture formation. Esophagus dilated by passing 54-French Maloney dilator   given history of dysphagia  ? GIVENS CAPSULE STUDY  05/27/2008  ? Adequate acid suppression with proton pump inhibitor.  ? LEFT WRIST CYST REMOVAL    ? LUNG SURGERY for pulmonary nodules    ? Mass removed  from roof of mouth    ? SAVORY DILATION  08/27/2011  ? stricture in the distal esophagus  ? UPPER GASTROINTESTINAL ENDOSCOPY  2006  ? X-STOP IMPLANTATION    ? ? ?Current Outpatient Medications  ?Medication Sig Dispense Refill  ? acetaminophen-codeine (TYLENOL #3) 300-30 MG tablet Take 1 tablet by mouth every 8 (eight) hours as needed for moderate pain. (Patient taking differently: Take 1 tablet by mouth as needed for moderate pain.) 15 tablet 0  ? albuterol (VENTOLIN HFA) 108 (90 Base) MCG/ACT inhaler Inhale into the lungs. (Patient not taking: No sig reported)    ? amlodipine-atorvastatin (CADUET)  10-20 MG tablet Take 1 tablet by mouth daily.    ? b complex vitamins capsule Take 1 capsule by mouth daily.    ? buPROPion (WELLBUTRIN XL) 150 MG 24 hr tablet Take 1 tablet by mouth every morning.    ? calcium carbonate (TUMS - DOSED IN MG ELEMENTAL CALCIUM) 500 MG chewable tablet Chew 1 tablet by mouth as needed. gas    ? Carboxymethylcellulose Sodium (THERATEARS OP) Apply to eye 2 (two) times daily.    ? dicyclomine (BENTYL) 10 MG capsule 1 po every other night followed by another dose in the morning. YOU MAY USE ONE AT 12 NOON IF NEEDED. (Patient taking differently: as needed.) 90 capsule 11  ? LORazepam (ATIVAN) 1 MG tablet Take 1 mg by mouth at bedtime.     ? metoprolol tartrate (LOPRESSOR) 25 MG tablet Take 25 mg by mouth 2 (two) times daily.     ? Multiple Vitamin (MULTIVITAMIN WITH MINERALS) TABS tablet Take 1 tablet by mouth daily.    ? Multiple Vitamins-Minerals (PRESERVISION AREDS PO) Take by mouth daily.    ? pantoprazole (PROTONIX) 40 MG tablet Take 40 mg by mouth 2 (two) times daily.    ? polyethylene glycol (MIRALAX / GLYCOLAX) packet Take 17 g by mouth daily as needed. Takes every 2-3 days    ? Probiotic Product (PROBIOTIC DAILY PO) Take by mouth every other day.    ? Venlafaxine HCl 225 MG TB24 Take 225 mg by mouth daily.     ? ?No current facility-administered medications for this visit.  ? ? ?Allergies as of 01/21/2022  ? (No Known Allergies)  ? ? ?Family History  ?Problem Relation Age of Onset  ? Colon cancer Neg Hx   ? ? ?Social History  ? ?Socioeconomic History  ? Marital status: Widowed  ?  Spouse name: Not on file  ? Number of children: Not on file  ? Years of education: Not on file  ? Highest education level: Not on file  ?Occupational History  ? Not on file  ?Tobacco Use  ? Smoking status: Former  ?  Types: Cigarettes  ?  Quit date: 09/16/2004  ?  Years since quitting: 17.3  ? Smokeless tobacco: Never  ? Tobacco comments:  ?  Quit x 8 years  ?Substance and Sexual Activity  ? Alcohol use:  No  ? Drug use: No  ? Sexual activity: Never  ?Other Topics Concern  ? Not on file  ?Social History Narrative  ? Not on file  ? ?Social Determinants of Health  ? ?Financial Resource Strain: Not on file  ?Food Insecurity: Not on file  ?Transportation Needs: Not on file  ?Physical Activity: Not on file  ?Stress: Not on file  ?Social Connections: Not on file  ? ? ? ?Review of Systems  ? ?Gen: Denies fever, chills, anorexia. Denies fatigue, weakness,  weight loss.  ?CV: Denies chest pain, palpitations, syncope, peripheral edema, and claudication. ?Resp: Denies dyspnea at rest, cough, wheezing, coughing up blood, and pleurisy. ?GI: Denies vomiting blood, jaundice, and fecal incontinence.   Denies dysphagia or odynophagia. ?Derm: Denies rash, itching, dry skin ?Psych: Denies depression, anxiety, memory loss, confusion. No homicidal or suicidal ideation.  ?Heme: Denies bruising, bleeding, and enlarged lymph nodes. ? ? ?Physical Exam  ? ?There were no vitals taken for this visit. ? ?General:   Alert and oriented. No distress noted. Pleasant and cooperative.  ?Head:  Normocephalic and atraumatic. ?Eyes:  Conjuctiva clear without scleral icterus. ?Mouth:  Oral mucosa pink and moist. Good dentition. No lesions. ?Lungs:  Clear to auscultation bilaterally. No wheezes, rales, or rhonchi. No distress.  ?Heart:  S1, S2 present without murmurs appreciated.  ?Abdomen:  +BS, soft, non-tender and non-distended. No rebound or guarding. No HSM or masses noted. ?Rectal: *** ?Msk:  Symmetrical without gross deformities. Normal posture. ?Extremities:  Without edema. ?Neurologic:  Alert and  oriented x4 ?Psych:  Alert and cooperative. Normal mood and affect. ? ? ?Assessment  ?MARITTA KIEF is a 88 y.o. female with a history of hiatal hernia, IBS with constipation and occasional diarrhea, chronic intermittent fecal incontinence s/p back surgery, GERD, gastroparesis, fibromyalgia, and colon polyps*** presenting today for follow up ? ?GERD:   ? ? ?IBS with constipation and occasional diarrhea:  ? ? ? ?PLAN  ? ?*** ? ? ? ? ?Venetia Night, MSN, FNP-BC, AGACNP-BC ?[facility] ?

## 2022-01-23 ENCOUNTER — Ambulatory Visit: Payer: Medicare HMO | Admitting: Gastroenterology

## 2022-02-20 ENCOUNTER — Encounter: Payer: Self-pay | Admitting: Gastroenterology

## 2022-02-20 ENCOUNTER — Ambulatory Visit (INDEPENDENT_AMBULATORY_CARE_PROVIDER_SITE_OTHER): Payer: Medicare Other | Admitting: Gastroenterology

## 2022-02-20 VITALS — BP 110/62 | HR 61 | Temp 97.1°F | Ht 68.0 in | Wt 147.6 lb

## 2022-02-20 DIAGNOSIS — K582 Mixed irritable bowel syndrome: Secondary | ICD-10-CM | POA: Diagnosis not present

## 2022-02-20 DIAGNOSIS — K219 Gastro-esophageal reflux disease without esophagitis: Secondary | ICD-10-CM

## 2022-02-20 MED ORDER — FAMOTIDINE 20 MG PO TABS: 20.0000 mg | ORAL_TABLET | Freq: Every day | ORAL | 3 refills | Status: DC | PRN

## 2022-02-20 NOTE — Patient Instructions (Addendum)
Continue pantoprazole '40mg'$  twice daily for acid reflux. ?You can use TUMS when needed for symptoms of burning, indigestion. ?Since TUMS is not working as well as in the past, I have sent in a prescription famotidine '20mg'$ . You can take one daily as needed for symptoms heartburn, indigestion.  ?Take Miralax 1/2 to one capful EVERY day for constipation. You can skip a day if you have loose stools.  ?Return to the office as needed. Please call with any questions or concerns.  ?

## 2022-02-20 NOTE — Progress Notes (Signed)
? ? ? ?GI Office Note   ? ?Referring Provider: Celene Squibb, MD ?Primary Care Physician:  Celene Squibb, MD  ?Primary Gastroenterologist: Elon Alas. Abbey Chatters, DO ? ? ?Chief Complaint  ? ?Chief Complaint  ?Patient presents with  ? Follow-up  ?  Still having issues with constipation, is only taking miralax every 4th day for 4 days straight.   ? ? ?History of Present Illness  ? ?Whitney Galloway is a 88 y.o. female presenting today for follow-up.  Last seen in August 2022.  She has a history of IBS with constipation and occasional diarrhea, chronic intermittent fecal incontinence status post back surgery years ago, gastroparesis, GERD. ? ?At last visit she was taking MiraLAX daily, having 2-3 stools per day but felt like stools were incomplete.  Previously tried [employer] but did not find it helpful.  She however was noted that she stopped MiraLAX and Benefiber to start ? ?She was advised to add Benefiber to Miralax. Patient states she does not think Benefiber is helpful so she is not taking it. She is not taking Miralax only when needed, she did not realized she should be taking daily. She will start miralax about every 4th day and take for four days in a row. She does not move her bowels regularly. Still feels constipated. Lower abdominal discomfort. She states she rarely goes anywhere because she can't hear and because she is afraid of having fecal incontinence. She has occasional vomiting. Still having acid reflux, worse at night. States she eats all of her junk food in the evenings. TUMS not helping as much anymore. No dysphagia. No melena, brbpr. ? ?  ? ? ?Medications  ? ?Current Outpatient Medications  ?Medication Sig Dispense Refill  ? acetaminophen-codeine (TYLENOL #3) 300-30 MG tablet Take 1 tablet by mouth every 8 (eight) hours as needed for moderate pain. (Patient taking differently: Take 1 tablet by mouth as needed for moderate pain.) 15 tablet 0  ? amlodipine-atorvastatin (CADUET) 10-20 MG tablet Take 1  tablet by mouth daily.    ? b complex vitamins capsule Take 1 capsule by mouth daily.    ? buPROPion (WELLBUTRIN XL) 150 MG 24 hr tablet Take 1 tablet by mouth every morning.    ? calcium carbonate (TUMS - DOSED IN MG ELEMENTAL CALCIUM) 500 MG chewable tablet Chew 1 tablet by mouth as needed. gas    ? Carboxymethylcellulose Sodium (THERATEARS OP) Apply to eye 2 (two) times daily.    ? dicyclomine (BENTYL) 10 MG capsule 1 po every other night followed by another dose in the morning. YOU MAY USE ONE AT 12 NOON IF NEEDED. (Patient taking differently: as needed.) 90 capsule 11  ? LORazepam (ATIVAN) 1 MG tablet Take 1 mg by mouth at bedtime.     ? metoprolol tartrate (LOPRESSOR) 25 MG tablet Take 25 mg by mouth 2 (two) times daily.     ? Multiple Vitamins-Minerals (PRESERVISION AREDS PO) Take by mouth daily.    ? pantoprazole (PROTONIX) 40 MG tablet Take 40 mg by mouth 2 (two) times daily.    ? polyethylene glycol (MIRALAX / GLYCOLAX) packet Take 17 g by mouth daily as needed. Takes every 2-3 days    ? Venlafaxine HCl 225 MG TB24 Take 225 mg by mouth daily.     ? Multiple Vitamin (MULTIVITAMIN WITH MINERALS) TABS tablet Take 1 tablet by mouth daily.    ? ?No current facility-administered medications for this visit.  ? ? ?Allergies  ? ?Allergies as  of 02/20/2022  ? (No Known Allergies)  ? ?   ? ?Review of Systems  ? ?General: Negative for anorexia, weight loss, fever, chills, fatigue, weakness. ?ENT: Negative for hoarseness, difficulty swallowing , nasal congestion. Hard of hearing. ?CV: Negative for chest pain, angina, palpitations, dyspnea on exertion, peripheral edema.  ?Respiratory: Negative for dyspnea at rest, dyspnea on exertion, cough, sputum, wheezing.  ?GI: See history of present illness. ?GU:  Negative for dysuria, hematuria, urinary incontinence, urinary frequency, nocturnal urination.  ?Endo: Negative for unusual weight change.  ?   ?Physical Exam  ? ?BP 110/62 (BP Location: Right Arm, Patient Position:  Sitting, Cuff Size: Normal)   Pulse 61   Temp (!) 97.1 ?F (36.2 ?C) (Temporal)   Ht '5\' 8"'$  (1.727 m)   Wt 147 lb 9.6 oz (67 kg)   SpO2 96%   BMI 22.44 kg/m?  ? Patient declined getting on exam table. ?General: Well-nourished, well-developed in no acute distress. Extremely hard of hearing. ?Eyes: No icterus. ?Mouth: Oropharyngeal mucosa moist and pink , no lesions erythema or exudate. ?Lungs: Clear to auscultation bilaterally.  ?Heart: Regular rate and rhythm, no murmurs rubs or gallops.  ?Abdomen: Bowel sounds are normal, nontender, nondistended, no hepatosplenomegaly or masses,  ?no abdominal bruits or hernia , no rebound or guarding.  ?Rectal: not performed.  ?Extremities: No lower extremity edema. No clubbing or deformities. ?Neuro: Alert and oriented x 4   ?Skin: Warm and dry, no jaundice.   ?Psych: Alert and cooperative, normal mood and affect. ? ?Labs  ? ?None available. ? ?Imaging Studies  ? ?No results found. ? ?Assessment  ? ?GERD: remains on pantoprazole '40mg'$  BID and has breakthrough night time symptoms even before she lays down. She admits to not eating right. Late night snacking, coffee at night. She uses TUMS for breakthrough symptoms but not working as well as in the past. No dysphagia. No associated abdominal pain. No weight loss. Reinforced antireflux measures. She says she is 88 years old and "whats the point".  ? ?Constipation: longstanding history of IBS-C. Having increased issues with constipation but no longer taking miralax daily. She goes four days without taking it and then takes four days in a row. Still works but would benefit with taking dose daily to help effectively move bowels. No melena, brbpr.   ? ? ?PLAN  ? ?Continue pantoprazole 40 mg twice daily before meals. ?She can continue Tums as needed this is less effective, would recommend famotidine 20 mg daily as needed.  Prescription sent to her pharmacy. ?Take MiraLAX half to 1 cap every day.  Hold only for diarrhea. ?Patient  declines using Benefiber. ?Return to the office as needed. ? ? ?Laureen Ochs. Chelly Dombeck, MHS, PA-C ?[facility] ? ?

## 2022-07-23 ENCOUNTER — Other Ambulatory Visit (HOSPITAL_COMMUNITY): Payer: Self-pay | Admitting: Family Medicine

## 2022-07-23 DIAGNOSIS — R059 Cough, unspecified: Secondary | ICD-10-CM

## 2022-07-25 ENCOUNTER — Encounter (HOSPITAL_COMMUNITY): Payer: Self-pay | Admitting: *Deleted

## 2022-07-25 ENCOUNTER — Other Ambulatory Visit: Payer: Self-pay

## 2022-07-25 ENCOUNTER — Emergency Department (HOSPITAL_COMMUNITY): Payer: Medicare Other

## 2022-07-25 ENCOUNTER — Emergency Department (HOSPITAL_COMMUNITY)
Admission: EM | Admit: 2022-07-25 | Discharge: 2022-07-25 | Disposition: A | Discharge status: Home / Self Care | Payer: Medicare Other | Attending: Emergency Medicine | Admitting: Emergency Medicine

## 2022-07-25 DIAGNOSIS — Z79899 Other long term (current) drug therapy: Secondary | ICD-10-CM | POA: Diagnosis not present

## 2022-07-25 DIAGNOSIS — J40 Bronchitis, not specified as acute or chronic: Secondary | ICD-10-CM | POA: Diagnosis not present

## 2022-07-25 DIAGNOSIS — Z20822 Contact with and (suspected) exposure to covid-19: Secondary | ICD-10-CM | POA: Insufficient documentation

## 2022-07-25 DIAGNOSIS — I10 Essential (primary) hypertension: Secondary | ICD-10-CM | POA: Diagnosis not present

## 2022-07-25 DIAGNOSIS — R0602 Shortness of breath: Secondary | ICD-10-CM | POA: Diagnosis present

## 2022-07-25 LAB — BASIC METABOLIC PANEL
Anion gap: 10 (ref 5–15)
BUN: 15 mg/dL (ref 8–23)
CO2: 24 mmol/L (ref 22–32)
Calcium: 8.7 mg/dL — ABNORMAL LOW (ref 8.9–10.3)
Chloride: 104 mmol/L (ref 98–111)
Creatinine, Ser: 0.87 mg/dL (ref 0.44–1.00)
GFR, Estimated: >60 mL/min (ref >60)
Glucose, Bld: 116 mg/dL — ABNORMAL HIGH (ref 70–99)
Potassium: 3.7 mmol/L (ref 3.5–5.1)
Sodium: 138 mmol/L (ref 135–145)

## 2022-07-25 LAB — URINALYSIS, ROUTINE W REFLEX MICROSCOPIC
Bilirubin Urine: NEGATIVE
Glucose, UA: NEGATIVE mg/dL
Hgb urine dipstick: NEGATIVE
Ketones, ur: 80 mg/dL — AB
Leukocytes,Ua: NEGATIVE
Nitrite: NEGATIVE
Protein, ur: NEGATIVE mg/dL
Specific Gravity, Urine: 1.018 (ref 1.005–1.030)
pH: 5 (ref 5.0–8.0)

## 2022-07-25 LAB — CBC WITH DIFFERENTIAL/PLATELET
Abs Immature Granulocytes: 0.02 10*3/uL (ref 0.00–0.07)
Basophils Absolute: 0.1 10*3/uL (ref 0.0–0.1)
Basophils Relative: 1 %
Eosinophils Absolute: 0.2 10*3/uL (ref 0.0–0.5)
Eosinophils Relative: 2 %
HCT: 39.5 % (ref 36.0–46.0)
Hemoglobin: 13.1 g/dL (ref 12.0–15.0)
Immature Granulocytes: 0 %
Lymphocytes Relative: 15 %
Lymphs Abs: 1.2 10*3/uL (ref 0.7–4.0)
MCH: 29.4 pg (ref 26.0–34.0)
MCHC: 33.2 g/dL (ref 30.0–36.0)
MCV: 88.6 fL (ref 80.0–100.0)
Monocytes Absolute: 0.5 10*3/uL (ref 0.1–1.0)
Monocytes Relative: 6 %
Neutro Abs: 6.1 10*3/uL (ref 1.7–7.7)
Neutrophils Relative %: 76 %
Platelets: 240 10*3/uL (ref 150–400)
RBC: 4.46 MIL/uL (ref 3.87–5.11)
RDW: 12.8 % (ref 11.5–15.5)
WBC: 8 10*3/uL (ref 4.0–10.5)
nRBC: 0 % (ref 0.0–0.2)

## 2022-07-25 LAB — RESP PANEL BY RT-PCR (FLU A&B, COVID) ARPGX2
Influenza A by PCR: NEGATIVE
Influenza B by PCR: NEGATIVE
SARS Coronavirus 2 by RT PCR: NEGATIVE

## 2022-07-25 MED ORDER — SODIUM CHLORIDE 0.9 % IV BOLUS
500.0000 mL | Freq: Once | INTRAVENOUS | Status: AC
  Administered 2022-07-25: 500 mL via INTRAVENOUS

## 2022-07-25 MED ORDER — BENZONATATE 100 MG PO CAPS: 100.0000 mg | ORAL_CAPSULE | Freq: Three times a day (TID) | ORAL | 0 refills | Status: DC

## 2022-07-25 NOTE — ED Notes (Signed)
Pt given ice water per request.

## 2022-07-25 NOTE — ED Triage Notes (Signed)
Pt brought in by [facility] from home with c/o SOB. Pt reports she has had a cough for a few days and given cough medicine from the doctor but it has only made her sleep. O2 sat 97% on RA upon EMS arrival. O2 at 2L via  placed for comfort.

## 2022-07-25 NOTE — ED Notes (Signed)
Pt attempted to use bedpan and unable to urinate. Nurse used bladder scan and there was no urine volume. PA made aware.

## 2022-07-25 NOTE — ED Provider Notes (Addendum)
[facility] EMERGENCY DEPARTMENT Provider Note   CSN: [id number] Arrival date & time: 07/25/22  1516     History Chief Complaint  Patient presents with   Shortness of Breath    Whitney Galloway is a 88 y.o. female patient with history of hypertension, GERD, IBS who presents to the emergency department today for further evaluation of cough and shortness of breath.  This been ongoing for a week.  She initially was seen evaluated by her PCP who put her on some cough medicine.  Call medicines seem to improve her cough.  Today, she got short of breath which brought her to the ED today. She currently denies shortness of breath. Patient is very hard of hearing and history is limited.  Per EMS, she was oxygenating well on room air. She denies any chest pain, fever, chills.   Shortness of Breath      Home Medications Prior to Admission medications   Medication Sig Start Date End Date Taking? Authorizing Provider  acetaminophen (TYLENOL) 650 MG CR tablet Take 650 mg by mouth every 8 (eight) hours as needed for pain.   Yes [provider]  acetaminophen-codeine (TYLENOL #3) 300-30 MG tablet Take 1 tablet by mouth every 8 (eight) hours as needed for moderate pain. Patient taking differently: Take 1 tablet by mouth as needed for moderate pain. 04/04/20  Yes Kathie Dike, MD  amlodipine-atorvastatin (CADUET) 10-20 MG tablet Take 1 tablet by mouth daily.   Yes [provider]  b complex vitamins capsule Take 1 capsule by mouth daily.   Yes [provider]  benzonatate (TESSALON) 100 MG capsule Take 1 capsule (100 mg total) by mouth every 8 (eight) hours. 07/25/22  Yes Raul Del, Jeniffer Culliver M, PA-C  buPROPion (WELLBUTRIN XL) 150 MG 24 hr tablet Take 1 tablet by mouth every morning. 01/19/21  Yes [provider]  calcium carbonate (TUMS - DOSED IN MG ELEMENTAL CALCIUM) 500 MG chewable tablet Chew 1 tablet by mouth as needed. gas   Yes [provider]   Carboxymethylcellulose Sodium (THERATEARS OP) Apply to eye 2 (two) times daily.   Yes [provider]  dicyclomine (BENTYL) 10 MG capsule 1 po every other night followed by another dose in the morning. YOU MAY USE ONE AT 12 NOON IF NEEDED. Patient taking differently: as needed. 06/19/16  Yes Mahala Menghini, PA-C  famotidine (PEPCID) 20 MG tablet Take 1 tablet (20 mg total) by mouth daily as needed for heartburn or indigestion. 02/20/22  Yes Mahala Menghini, PA-C  LORazepam (ATIVAN) 1 MG tablet Take 1 mg by mouth at bedtime.    Yes [provider]  Multiple Vitamin (MULTIVITAMIN WITH MINERALS) TABS tablet Take 1 tablet by mouth daily.   Yes [provider]  Multiple Vitamins-Minerals (PRESERVISION AREDS PO) Take by mouth daily.   Yes [provider]  pantoprazole (PROTONIX) 40 MG tablet Take 40 mg by mouth 2 (two) times daily. 10/22/20  Yes [provider]  polyethylene glycol (MIRALAX / GLYCOLAX) packet Take 17 g by mouth daily as needed. Takes every 2-3 days   Yes [provider]  Venlafaxine HCl 225 MG TB24 Take 225 mg by mouth daily.    Yes [provider]  metoprolol tartrate (LOPRESSOR) 25 MG tablet Take 25 mg by mouth 2 (two) times daily.  Patient not taking: Reported on 07/25/2022 03/16/20   [provider]      Allergies    Patient has no known allergies.  Review of Systems   Review of Systems  Respiratory:  Positive for shortness of breath.   All other systems reviewed and are negative.   Physical Exam Updated Vital Signs BP (!) 125/41   Pulse 78   Temp 97.9 F (36.6 C) (Oral)   Resp 17   Ht '5\' 8"'$  (1.727 m)   Wt 67 kg   SpO2 97%   BMI 22.46 kg/m  Physical Exam Vitals and nursing note reviewed.  Constitutional:      General: She is not in acute distress.    Appearance: Normal appearance.     Comments: Elderly and frail.   HENT:     Head: Normocephalic and atraumatic.  Eyes:     General:         Right eye: No discharge.        Left eye: No discharge.  Cardiovascular:     Comments: Regular rate and rhythm.  S1/S2 are distinct without any evidence of murmur, rubs, or gallops.  Radial pulses are 2+ bilaterally.  Dorsalis pedis pulses are 2+ bilaterally.  No evidence of pedal edema. Pulmonary:     Comments: Clear to auscultation bilaterally.  Normal effort.  No respiratory distress.  No evidence of wheezes, rales, or rhonchi heard throughout. Abdominal:     General: Abdomen is flat. Bowel sounds are normal. There is no distension.     Tenderness: There is no abdominal tenderness. There is no guarding or rebound.  Musculoskeletal:        General: Normal range of motion.     Cervical back: Neck supple.  Skin:    General: Skin is warm and dry.     Findings: No rash.  Neurological:     General: No focal deficit present.     Mental Status: She is alert.  Psychiatric:        Mood and Affect: Mood normal.        Behavior: Behavior normal.     ED Results / Procedures / Treatments   Labs (all labs ordered are listed, but only abnormal results are displayed) Labs Reviewed  BASIC METABOLIC PANEL - Abnormal; Notable for the following components:      Result Value   Glucose, Bld 116 (*)    Calcium 8.7 (*)    All other components within normal limits  URINALYSIS, ROUTINE W REFLEX MICROSCOPIC - Abnormal; Notable for the following components:   Ketones, ur 80 (*)    All other components within normal limits  RESP PANEL BY RT-PCR (FLU A&B, COVID) ARPGX2  CBC WITH DIFFERENTIAL/PLATELET    EKG None  Radiology DG Chest Port 1 View  Result Date: 07/25/2022 CLINICAL DATA:  Cough EXAM: PORTABLE CHEST 1 VIEW COMPARISON:  Chest x-ray dated December 13, 2020 FINDINGS: Cardiac and mediastinal contours are unchanged. Postsurgical changes of the right lung background emphysema. No focal consolidation. No pleural effusion or pneumothorax. IMPRESSION: Postsurgical changes of the right lung  background emphysema. No evidence of acute airspace opacity. Electronically Signed   By: Yetta Glassman M.D.   On: 07/25/2022 16:45    Procedures Procedures    Medications Ordered in ED Medications  sodium chloride 0.9 % bolus 500 mL (0 mLs Intravenous Stopped 07/25/22 2056)    ED Course/ Medical Decision Making/ A&P Clinical Course as of 07/25/22 2131  Thu Jul 25, 2022  1708 Stable 89 YOF with SOB. 1 week of cough. Intermittent SOB. COVID/PNA [CC]  1727 CBC with Differential Negative. [CF]  5465 Basic metabolic  panel(!) Negative. [CF]  1727 Resp Panel by RT-PCR (Flu A&B, Covid) Anterior Nasal Swab No signs of COVID or flu. [CF]  5188 DG Chest [facility] I personally ordered and interpreted the study and do not see any evidence of pneumonia.  I do agree with the radiologist interpretation. [CF]  1859 I spoke with the daughter at length at the bedside.  She states that she was never notified by EMS that the patient was ever taken from her home.  Patient did call EMS.  Daughter was never aware.  I was told by EMS that daughter was on her way when the patient first arrived in the ED.  After having a lengthy discussion, we will add on a urinalysis to evaluate for urinary tract infection as a possible etiology of her feeling poorly.  Daughter was also inquiring about nursing home placement.  This is per patient request that the daughter and her had an discussion outside hospital.  I told the daughter that it will be days until she is evaluated by social work and ultimately placed. [CF]  2120 Urinalysis, Routine w reflex microscopic Urine, In & Out Cath(!) Normal. [CF]  2120 On reevaluation, patient will go home with daughter.  Daughter will plan to stay with the patient at least to the weekend they will follow-up with her primary care doctor for further evaluation next week.  All questions or concern addressed.  She is safe for discharge. [CF]    Clinical Course User Index [CC] Tretha Sciara, MD [CF] Hendricks Limes, [place]                           Medical Decision Making Whitney Galloway is a 88 y.o. female patient who presents to the emergency department today for further evaluation of cough and shortness of breath.  Patient currently denies shortness of breath here.  She is oxygenating well on room air around 97%.  She is in no acute distress at this time.  Given her age, I will likely get a chest x-ray and get some basic labs to further assess.  Chest x-ray did not show any evidence of pneumonia as highlighted in the ED course.  I do not see any clinical indication to admit her today.  There is no clinical signs of sepsis or pneumonia.  I will prescribe her some Tessalon Perles as this is likely bronchitis.  I will have her follow-up with her primary care doctor.  Strict return precautions were discussed.  All questions or concerns addressed.  She is safe for discharge.   Amount and/or Complexity of Data Reviewed Labs: ordered. Decision-making details documented in ED Course. Radiology: ordered. Decision-making details documented in ED Course.  Risk Prescription drug management.   Final Clinical Impression(s) / ED Diagnoses Final diagnoses:  Bronchitis    Rx / DC Orders ED Discharge Orders          Ordered    benzonatate (TESSALON) 100 MG capsule  Every 8 hours        07/25/22 1732              Hendricks Limes, PA-C 07/25/22 1735    Hendricks Limes, PA-C 07/25/22 2131    Tretha Sciara, MD 07/25/22 2226

## 2022-07-25 NOTE — Discharge Instructions (Addendum)
There is no evidence of pneumonia today.  This is likely a viral infection.  I have given you Tessalon Perles which should help you with your cough.  You may use Tylenol or ibuprofen for aches and pains.  Would like for you to follow-up with your primary care doctor sometime next week to ensure we are going in the right direction.  Please return to the emergency department for any worsening symptoms.

## 2022-08-28 ENCOUNTER — Telehealth: Payer: Self-pay

## 2022-08-28 MED ORDER — DICYCLOMINE HCL 10 MG PO CAPS: 10.0000 mg | ORAL_CAPSULE | Freq: Every day | ORAL | 0 refills | Status: DC | PRN

## 2022-08-28 NOTE — Addendum Note (Signed)
Addended by: Mahala Menghini on: 08/28/2022 09:53 AM   Modules accepted: Orders

## 2022-08-28 NOTE — Telephone Encounter (Signed)
Pt is needing a refill on her bentyl sent to [facility]. Pt was last seen on 02/20/2022.

## 2022-08-28 NOTE — Telephone Encounter (Signed)
Rx done. 

## 2022-10-08 ENCOUNTER — Other Ambulatory Visit: Payer: Self-pay

## 2022-10-08 ENCOUNTER — Encounter (HOSPITAL_COMMUNITY): Payer: Self-pay | Admitting: Internal Medicine

## 2022-10-08 ENCOUNTER — Emergency Department (HOSPITAL_COMMUNITY): Payer: Medicare Other

## 2022-10-08 ENCOUNTER — Inpatient Hospital Stay (HOSPITAL_COMMUNITY)
Admission: EM | Admit: 2022-10-08 | Discharge: 2022-10-15 | DRG: 689 | Disposition: A | Discharge status: Skilled Nursing Facility | Payer: Medicare Other | Attending: Family Medicine | Admitting: Family Medicine

## 2022-10-08 DIAGNOSIS — Z79899 Other long term (current) drug therapy: Secondary | ICD-10-CM

## 2022-10-08 DIAGNOSIS — N390 Urinary tract infection, site not specified: Secondary | ICD-10-CM | POA: Diagnosis present

## 2022-10-08 DIAGNOSIS — Z8719 Personal history of other diseases of the digestive system: Secondary | ICD-10-CM

## 2022-10-08 DIAGNOSIS — K3184 Gastroparesis: Secondary | ICD-10-CM | POA: Diagnosis present

## 2022-10-08 DIAGNOSIS — N3000 Acute cystitis without hematuria: Secondary | ICD-10-CM | POA: Diagnosis not present

## 2022-10-08 DIAGNOSIS — Z602 Problems related to living alone: Secondary | ICD-10-CM | POA: Diagnosis present

## 2022-10-08 DIAGNOSIS — F419 Anxiety disorder, unspecified: Secondary | ICD-10-CM | POA: Diagnosis present

## 2022-10-08 DIAGNOSIS — F32A Depression, unspecified: Secondary | ICD-10-CM | POA: Diagnosis present

## 2022-10-08 DIAGNOSIS — R5381 Other malaise: Secondary | ICD-10-CM | POA: Insufficient documentation

## 2022-10-08 DIAGNOSIS — U071 COVID-19: Secondary | ICD-10-CM | POA: Diagnosis present

## 2022-10-08 DIAGNOSIS — K219 Gastro-esophageal reflux disease without esophagitis: Secondary | ICD-10-CM | POA: Diagnosis present

## 2022-10-08 DIAGNOSIS — F341 Dysthymic disorder: Secondary | ICD-10-CM | POA: Diagnosis present

## 2022-10-08 DIAGNOSIS — G9341 Metabolic encephalopathy: Secondary | ICD-10-CM | POA: Diagnosis present

## 2022-10-08 DIAGNOSIS — F05 Delirium due to known physiological condition: Secondary | ICD-10-CM | POA: Diagnosis present

## 2022-10-08 DIAGNOSIS — I10 Essential (primary) hypertension: Secondary | ICD-10-CM | POA: Diagnosis not present

## 2022-10-08 DIAGNOSIS — M797 Fibromyalgia: Secondary | ICD-10-CM | POA: Diagnosis present

## 2022-10-08 DIAGNOSIS — R159 Full incontinence of feces: Secondary | ICD-10-CM | POA: Diagnosis present

## 2022-10-08 DIAGNOSIS — Z87891 Personal history of nicotine dependence: Secondary | ICD-10-CM

## 2022-10-08 DIAGNOSIS — E871 Hypo-osmolality and hyponatremia: Secondary | ICD-10-CM | POA: Diagnosis not present

## 2022-10-08 DIAGNOSIS — R410 Disorientation, unspecified: Secondary | ICD-10-CM

## 2022-10-08 DIAGNOSIS — M810 Age-related osteoporosis without current pathological fracture: Secondary | ICD-10-CM | POA: Diagnosis present

## 2022-10-08 DIAGNOSIS — R531 Weakness: Secondary | ICD-10-CM

## 2022-10-08 DIAGNOSIS — Z66 Do not resuscitate: Secondary | ICD-10-CM | POA: Diagnosis present

## 2022-10-08 DIAGNOSIS — B9620 Unspecified Escherichia coli [E. coli] as the cause of diseases classified elsewhere: Secondary | ICD-10-CM | POA: Diagnosis present

## 2022-10-08 DIAGNOSIS — Z974 Presence of external hearing-aid: Secondary | ICD-10-CM

## 2022-10-08 DIAGNOSIS — H9190 Unspecified hearing loss, unspecified ear: Secondary | ICD-10-CM | POA: Diagnosis present

## 2022-10-08 DIAGNOSIS — E876 Hypokalemia: Secondary | ICD-10-CM | POA: Diagnosis not present

## 2022-10-08 LAB — URINALYSIS, ROUTINE W REFLEX MICROSCOPIC
Bilirubin Urine: NEGATIVE
Glucose, UA: NEGATIVE mg/dL
Hgb urine dipstick: NEGATIVE
Ketones, ur: 20 mg/dL — AB
Nitrite: POSITIVE — AB
Protein, ur: NEGATIVE mg/dL
Specific Gravity, Urine: 1.019 (ref 1.005–1.030)
pH: 5 (ref 5.0–8.0)

## 2022-10-08 LAB — CBC
HCT: 36.3 % (ref 36.0–46.0)
Hemoglobin: 11.9 g/dL — ABNORMAL LOW (ref 12.0–15.0)
MCH: 29.2 pg (ref 26.0–34.0)
MCHC: 32.8 g/dL (ref 30.0–36.0)
MCV: 89 fL (ref 80.0–100.0)
Platelets: 148 10*3/uL — ABNORMAL LOW (ref 150–400)
RBC: 4.08 MIL/uL (ref 3.87–5.11)
RDW: 13.3 % (ref 11.5–15.5)
WBC: 3.6 10*3/uL — ABNORMAL LOW (ref 4.0–10.5)
nRBC: 0 % (ref 0.0–0.2)

## 2022-10-08 LAB — COMPREHENSIVE METABOLIC PANEL
ALT: 18 U/L (ref 0–44)
AST: 32 U/L (ref 15–41)
Albumin: 3.4 g/dL — ABNORMAL LOW (ref 3.5–5.0)
Alkaline Phosphatase: 81 U/L (ref 38–126)
Anion gap: 9 (ref 5–15)
BUN: 13 mg/dL (ref 8–23)
CO2: 24 mmol/L (ref 22–32)
Calcium: 8.3 mg/dL — ABNORMAL LOW (ref 8.9–10.3)
Chloride: 102 mmol/L (ref 98–111)
Creatinine, Ser: 0.7 mg/dL (ref 0.44–1.00)
GFR, Estimated: >60 mL/min (ref >60)
Glucose, Bld: 98 mg/dL (ref 70–99)
Potassium: 3.7 mmol/L (ref 3.5–5.1)
Sodium: 135 mmol/L (ref 135–145)
Total Bilirubin: 0.7 mg/dL (ref 0.3–1.2)
Total Protein: 6.1 g/dL — ABNORMAL LOW (ref 6.5–8.1)

## 2022-10-08 LAB — TROPONIN I (HIGH SENSITIVITY)
Troponin I (High Sensitivity): 9 ng/L (ref <18)
Troponin I (High Sensitivity): 9 ng/L (ref <18)

## 2022-10-08 LAB — FERRITIN: Ferritin: 55 ng/mL (ref 11–307)

## 2022-10-08 LAB — GLUCOSE, CAPILLARY: Glucose-Capillary: 84 mg/dL (ref 70–99)

## 2022-10-08 LAB — RESP PANEL BY RT-PCR (RSV, FLU A&B, COVID)  RVPGX2
Influenza A by PCR: NEGATIVE
Influenza B by PCR: NEGATIVE
Resp Syncytial Virus by PCR: NEGATIVE
SARS Coronavirus 2 by RT PCR: POSITIVE — AB

## 2022-10-08 LAB — LACTATE DEHYDROGENASE: LDH: 111 U/L (ref 98–192)

## 2022-10-08 LAB — D-DIMER, QUANTITATIVE: D-Dimer, Quant: 0.59 ug/mL-FEU — ABNORMAL HIGH (ref 0.00–0.50)

## 2022-10-08 LAB — CBG MONITORING, ED: Glucose-Capillary: 71 mg/dL (ref 70–99)

## 2022-10-08 MED ORDER — NIRMATRELVIR/RITONAVIR (PAXLOVID) TABLET (RENAL DOSING)
2.0000 | ORAL_TABLET | Freq: Two times a day (BID) | ORAL | Status: AC
  Administered 2022-10-08 – 2022-10-13 (×10): 2 via ORAL
  Filled 2022-10-08: qty 20

## 2022-10-08 MED ORDER — ATORVASTATIN CALCIUM 10 MG PO TABS
20.0000 mg | ORAL_TABLET | Freq: Every day | ORAL | Status: DC
  Administered 2022-10-08: 20 mg via ORAL
  Filled 2022-10-08 (×2): qty 2

## 2022-10-08 MED ORDER — ONDANSETRON HCL 4 MG PO TABS: 4.0000 mg | ORAL_TABLET | Freq: Four times a day (QID) | ORAL | Status: DC | PRN

## 2022-10-08 MED ORDER — VENLAFAXINE HCL ER 75 MG PO CP24
225.0000 mg | ORAL_CAPSULE | Freq: Every day | ORAL | Status: DC
  Administered 2022-10-08 – 2022-10-15 (×8): 225 mg via ORAL
  Filled 2022-10-08 (×8): qty 3

## 2022-10-08 MED ORDER — PANTOPRAZOLE SODIUM 40 MG PO TBEC
40.0000 mg | DELAYED_RELEASE_TABLET | Freq: Every day | ORAL | Status: DC
  Administered 2022-10-08: 40 mg via ORAL
  Filled 2022-10-08 (×2): qty 1

## 2022-10-08 MED ORDER — ONDANSETRON HCL 4 MG/2ML IJ SOLN: 4.0000 mg | Freq: Four times a day (QID) | INTRAMUSCULAR | Status: DC | PRN

## 2022-10-08 MED ORDER — METOPROLOL TARTRATE 25 MG PO TABS
25.0000 mg | ORAL_TABLET | Freq: Two times a day (BID) | ORAL | Status: DC
  Administered 2022-10-08: 25 mg via ORAL
  Filled 2022-10-08 (×2): qty 1

## 2022-10-08 MED ORDER — NIRMATRELVIR/RITONAVIR (PAXLOVID)TABLET
3.0000 | ORAL_TABLET | Freq: Two times a day (BID) | ORAL | Status: DC
  Filled 2022-10-08: qty 30

## 2022-10-08 MED ORDER — AMLODIPINE BESYLATE 5 MG PO TABS
10.0000 mg | ORAL_TABLET | Freq: Every day | ORAL | Status: DC
  Administered 2022-10-08: 10 mg via ORAL
  Filled 2022-10-08 (×2): qty 2

## 2022-10-08 MED ORDER — GUAIFENESIN-DM 100-10 MG/5ML PO SYRP: 10.0000 mL | ORAL_SOLUTION | ORAL | Status: DC | PRN

## 2022-10-08 MED ORDER — IOHEXOL 300 MG/ML  SOLN
100.0000 mL | Freq: Once | INTRAMUSCULAR | Status: AC | PRN
  Administered 2022-10-08: 100 mL via INTRAVENOUS

## 2022-10-08 MED ORDER — DICYCLOMINE HCL 10 MG PO CAPS: 10.0000 mg | ORAL_CAPSULE | Freq: Every day | ORAL | Status: DC | PRN

## 2022-10-08 MED ORDER — BUPROPION HCL ER (XL) 150 MG PO TB24
150.0000 mg | ORAL_TABLET | Freq: Every morning | ORAL | Status: DC
  Administered 2022-10-08 – 2022-10-15 (×8): 150 mg via ORAL
  Filled 2022-10-08 (×8): qty 1

## 2022-10-08 MED ORDER — AMLODIPINE-ATORVASTATIN 10-20 MG PO TABS: 1.0000 | ORAL_TABLET | Freq: Every day | ORAL | Status: DC

## 2022-10-08 MED ORDER — POLYETHYLENE GLYCOL 3350 17 G PO PACK: 17.0000 g | PACK | Freq: Every day | ORAL | Status: DC | PRN

## 2022-10-08 MED ORDER — POTASSIUM CHLORIDE IN NACL 20-0.9 MEQ/L-% IV SOLN: INTRAVENOUS | Status: AC

## 2022-10-08 MED ORDER — SODIUM CHLORIDE 0.9 % IV SOLN
1.0000 g | Freq: Once | INTRAVENOUS | Status: AC
  Administered 2022-10-08: 1 g via INTRAVENOUS
  Filled 2022-10-08: qty 10

## 2022-10-08 MED ORDER — ENOXAPARIN SODIUM 40 MG/0.4ML IJ SOSY
40.0000 mg | PREFILLED_SYRINGE | INTRAMUSCULAR | Status: DC
  Administered 2022-10-08 – 2022-10-14 (×7): 40 mg via SUBCUTANEOUS
  Filled 2022-10-08 (×7): qty 0.4

## 2022-10-08 MED ORDER — LORAZEPAM 1 MG PO TABS
1.0000 mg | ORAL_TABLET | Freq: Every day | ORAL | Status: DC
  Administered 2022-10-08 – 2022-10-14 (×7): 1 mg via ORAL
  Filled 2022-10-08 (×7): qty 1

## 2022-10-08 MED ORDER — SODIUM CHLORIDE 0.9 % IV SOLN
1.0000 g | INTRAVENOUS | Status: DC
  Administered 2022-10-09 – 2022-10-11 (×3): 1 g via INTRAVENOUS
  Filled 2022-10-08 (×3): qty 10

## 2022-10-08 NOTE — ED Provider Notes (Signed)
[facility] EMERGENCY DEPARTMENT Provider Note   CSN: [id number] Arrival date & time: 10/08/22  1055     History  Chief Complaint  Patient presents with   Weakness    Whitney Galloway is a 89 y.o. female.  89 year old female with a history of gastroparesis, hypertension, IBS, and diverticulosis who presents emergency department with generalized fatigue, cough, abdominal pain, nausea.  History obtained per patient and her Whitney Galloway.  Patient has chronic history of nausea and vomiting.  Reports that over the past few days the nausea and abdominal pain have gotten worse.  Describes it as left upper quadrant and worse with eating.  Has difficulty further characterizing the pain.  Whitney Galloway states that she is also been congested with a cough recently.  No fevers noted at home.  No changes to bowel or bladder habits.  Whitney Galloway reports that this morning she appeared confused as well.  Denies any chest pain or shortness of breath.  Had an appointment with her primary doctor today but reported that she was too fatigued to go so her Whitney Galloway brought her to the emergency department.       Home Medications Prior to Admission medications   Medication Sig Start Date End Date Taking? Authorizing Provider  acetaminophen (TYLENOL) 650 MG CR tablet Take 650 mg by mouth every 8 (eight) hours as needed for pain.   Yes [provider]  amlodipine-atorvastatin (CADUET) 10-20 MG tablet Take 1 tablet by mouth daily.   Yes [provider]  b complex vitamins capsule Take 1 capsule by mouth daily.   Yes [provider]  buPROPion (WELLBUTRIN XL) 150 MG 24 hr tablet Take 1 tablet by mouth every morning. 01/19/21  Yes [provider]  Carboxymethylcellulose Sodium (THERATEARS OP) Apply to eye 2 (two) times daily.   Yes [provider]  dicyclomine (BENTYL) 10 MG capsule Take 1 capsule (10 mg total) by mouth daily as needed. Patient taking differently: Take 10 mg by mouth  daily as needed for spasms. 08/28/22  Yes Mahala Menghini, PA-C  LORazepam (ATIVAN) 1 MG tablet Take 1 mg by mouth at bedtime.    Yes [provider]  metoprolol tartrate (LOPRESSOR) 25 MG tablet Take 25 mg by mouth 2 (two) times daily. 03/16/20  Yes [provider]  Multiple Vitamin (MULTIVITAMIN WITH MINERALS) TABS tablet Take 1 tablet by mouth daily.   Yes [provider]  Multiple Vitamins-Minerals (PRESERVISION AREDS PO) Take by mouth daily.   Yes [provider]  pantoprazole (PROTONIX) 40 MG tablet Take 40 mg by mouth daily. 10/22/20  Yes [provider]  Venlafaxine HCl 225 MG TB24 Take 225 mg by mouth daily.    Yes [provider]  acetaminophen-codeine (TYLENOL #3) 300-30 MG tablet Take 1 tablet by mouth every 8 (eight) hours as needed for moderate pain. Patient not taking: Reported on 10/08/2022 04/04/20   Kathie Dike, MD  benzonatate (TESSALON) 100 MG capsule Take 1 capsule (100 mg total) by mouth every 8 (eight) hours. Patient not taking: Reported on 10/08/2022 07/25/22   Hendricks Limes, PA-C  famotidine (PEPCID) 20 MG tablet Take 1 tablet (20 mg total) by mouth daily as needed for heartburn or indigestion. Patient not taking: Reported on 10/08/2022 02/20/22   Mahala Menghini, PA-C      Allergies    Patient has no known allergies.    Review of Systems   Review of Systems  Physical Exam Updated Vital Signs BP (!) 143/63  Pulse 74   Temp 98.4 F (36.9 C) (Oral)   Resp (!) 24   Ht '5\' 8"'$  (1.727 m)   Wt 68 kg   SpO2 95%   BMI 22.81 kg/m  Physical Exam Vitals and nursing note reviewed.  Constitutional:      General: She is not in acute distress.    Appearance: She is well-developed.     Comments: Alert and oriented x 3.  Well-appearing.  HENT:     Head: Normocephalic and atraumatic.     Right Ear: External ear normal.     Left Ear: External ear normal.     Nose: Nose normal.  Eyes:     Extraocular Movements:  Extraocular movements intact.     Conjunctiva/sclera: Conjunctivae normal.     Pupils: Pupils are equal, round, and reactive to light.  Cardiovascular:     Rate and Rhythm: Normal rate and regular rhythm.     Heart sounds: No murmur heard. Pulmonary:     Effort: Pulmonary effort is normal. No respiratory distress.     Breath sounds: Normal breath sounds.  Abdominal:     General: Abdomen is flat. There is no distension.     Palpations: Abdomen is soft. There is no mass.     Tenderness: There is abdominal tenderness (Epigastrium). There is no guarding.  Musculoskeletal:     Cervical back: Normal range of motion and neck supple.  Skin:    General: Skin is warm and dry.  Neurological:     Mental Status: She is alert and oriented to person, place, and time. Mental status is at baseline.     Cranial Nerves: No cranial nerve deficit.     Motor: No weakness.  Psychiatric:        Mood and Affect: Mood normal.     ED Results / Procedures / Treatments   Labs (all labs ordered are listed, but only abnormal results are displayed) Labs Reviewed  CBC - Abnormal; Notable for the following components:      Result Value   WBC 3.6 (*)    Hemoglobin 11.9 (*)    Platelets 148 (*)    All other components within normal limits  COMPREHENSIVE METABOLIC PANEL - Abnormal; Notable for the following components:   Calcium 8.3 (*)    Total Protein 6.1 (*)    Albumin 3.4 (*)    All other components within normal limits  RESP PANEL BY RT-PCR (RSV, FLU A&B, COVID)  [id number]  URINALYSIS, ROUTINE W REFLEX MICROSCOPIC  CBG MONITORING, ED  TROPONIN I (HIGH SENSITIVITY)    EKG EKG Interpretation  Date/Time:  Tuesday October 08 2022 11:05:23 EST Ventricular Rate:  83 PR Interval:  169 QRS Duration: 82 QT Interval:  361 QTC Calculation: 425 R Axis:   71 Text Interpretation: Sinus rhythm Borderline repolarization abnormality Confirmed by Margaretmary Eddy [phone]) on 10/08/2022 11:06:42 AM  Radiology No  results found.  Procedures Procedures   Medications Ordered in ED Medications - No data to display  ED Course/ Medical Decision Making/ A&P                           Medical Decision Making Amount and/or Complexity of Data Reviewed Labs: ordered.   KADEY MIHALIC is a 89 y.o. female with comorbidities that complicate the patient evaluation including gastroparesis, hypertension, IBS, and diverticulosis who presents emergency department with generalized fatigue, cough, abdominal pain, and nausea.    Initial Ddx:  URI, pneumonia, MI, intra-abdominal infection  MDM:  Differential for patient's symptoms is quite broad given her weakness, abdominal pain, nausea, and URI symptoms.  Will obtain flu and covid for her URI symptoms.  Given her advanced age is at high risk for intra-abdominal infection and malignancy so we will obtain CT of the abdomen pelvis with IV contrast.  With her weakness we will also obtain EKG and troponins to evaluate for MI though feel this is less likely.  Plan:  ***  ED Summary/Re-evaluation:  ***  This patient presents to the ED for concern of complaints listed in HPI, this involves an extensive number of treatment options, and is a complaint that carries with it a high risk of complications and morbidity. Disposition including potential need for admission considered.   Dispo: {Disposition:28069}  Additional history obtained from {Additional History:28067} Records reviewed {Records Reviewed:28068} The following labs were independently interpreted: {labs interpreted:28064} and show {lab findings:28250} I independently reviewed the following imaging with scope of interpretation limited to determining acute life threatening conditions related to emergency care: {imaging interpreted:28065} and agree with the radiologist interpretation with the following exceptions: *** I personally reviewed and interpreted cardiac monitoring: {cardiac monitoring:28251} I  personally reviewed and interpreted the pt's EKG: see above for interpretation  I have reviewed the patients home medications and made adjustments as needed Consults: {Consultants:28063} Social Determinants of health:  ***  {Document critical care time when appropriate:1} {Document POCUS if Performed:1}   {Document critical care time when appropriate:1} {Document review of labs and clinical decision tools ie heart score, Chads2Vasc2 etc:1}  {Document your independent review of radiology images, and any outside records:1} {Document your discussion with family members, caretakers, and with consultants:1} {Document social determinants of health affecting pt's care:1} {Document your decision making why or why not admission, treatments were needed:1} Final Clinical Impression(s) / ED Diagnoses Final diagnoses:  None    Rx / DC Orders ED Discharge Orders     None

## 2022-10-08 NOTE — Assessment & Plan Note (Addendum)
Presenting with encephalopathy.  Ruled out for sepsis.   WBC 3.6.  Afebrile.  UA suggestive of UTI. -Urine culture positive for E. Coli -pansensitive -IV ceftriaxone

## 2022-10-08 NOTE — ED Notes (Signed)
Verbal to intermittently catheterize pt. 352m of urine voided from catheter

## 2022-10-08 NOTE — Assessment & Plan Note (Signed)
Presenting with nausea, reported LUQ pain.  Poor oral intake.  CT abdomen and pelvis negative for acute abnormality, suggest benign splenic hematoma.

## 2022-10-08 NOTE — Assessment & Plan Note (Signed)
Resume venlafaxine, bupropion

## 2022-10-08 NOTE — ED Notes (Signed)
Pt is retaining over 32m of urine in bladder per bladder scanner--Md made aware

## 2022-10-08 NOTE — H&P (Signed)
History and Physical    Whitney Galloway [facility] QGB:[id number] DOB: Feb 10, 1929 DOA: 10/08/2022  PCP: Celene Squibb, MD   Patient coming from: Home  I have personally briefly reviewed patient's old medical records in [place]  Chief Complaint: AMS  HPI: Whitney Galloway is a 89 y.o. female with medical history significant for hypertension, depression and anxiety, hearing impairment. Patient was brought to the ED reports of weakness, nausea and confusion.  Patient is very hard of hearing, has history from patient is limited, daughter at bedside assist with history.  Patient lives alone without significant memory problems.  Patient's daughter reports that patient has been weak, with nausea and poor oral intake, headache over the past 2 days.  She is not aware of abdominal pain.  No diarrhea.  No vomiting. She has had a cough, but no difficulty breathing.  She is unaware of any falls. Today patient became confused, disoriented with confused speech.  Daughter was concerned for UTI as she has had similar presentation in the past.  ED Course: Tmax 98.4.  Pulse rate 70-92.  Respiratory rate 14-26.  Blood pressure systolic 975-883.  O2 sats 91-98 % on room air. COVID test positive. UA with trace leukocytes and positive nitrites.  Chest x-ray negative for acute abnormality.  CT abdomen and pelvis - without acute abnormality, suggest benign splenic hematoma. Urine cultures obtained IV ceftriaxone given.  Review of Systems: As per HPI all other systems reviewed and negative.  Past Medical History:  Diagnosis Date   Bowel incontinence    Colon polyp 1994 Malignant polyp   2005 NL BE; 2006 incomplete TCS DUE TO REDUNDNACY   Diverticulosis    Fibromyalgia    Gastritis    Gastroparesis 2009   65% RETENTION AT 2 HOURS   GERD (gastroesophageal reflux disease)    Hard of hearing    Hernia of unspecified site of abdominal cavity without mention of obstruction or gangrene    Hiatal hernia    HIATAL  HERNIA 06/23/2008   Qualifier: Diagnosis of  By: Craige Cotta     HTN (hypertension)    IBS (irritable bowel syndrome)    Osteoporosis    S/P endoscopy 2007, 2009   2007: single distal esophageal erosion, s/p 56 F dilation, no web/ring/stricture noted; 2009: normal, Bravo with adequate suppression on Prevacid BID   Splenic lesion HAMARTOMA   TB (tuberculosis) AGE 83   Wears dentures    top   Wears glasses    Wears hearing aid     Past Surgical History:  Procedure Laterality Date   BACK SURGERY  2014   lumbar lam   Benign tumor removed from bilateral breast     cataract surgery     COCHLEAR IMPLANT Left 09/21/2014   Procedure: COCHLEAR IMPLANT LEFT EAR;  Surgeon: Fannie Knee, MD;  Location: [place];  Service: ENT;  Laterality: Left;   COLONOSCOPY W/ POLYPECTOMY     ESOPHAGOGASTRODUODENOSCOPY  08/27/2011   hiatal hernia/mild gastritis   ESOPHAGOGASTRODUODENOSCOPY  05/25/2008    Normal esophagus without evidence of Barrett mass/  Normal stomach, duodenal bulb, and second portion of the duodenum   ESOPHAGOGASTRODUODENOSCOPY  08/11/2006   Single erosion at distal esophagus and a small sliding hiatal hernia/ No evidence of ring or stricture but esophagus was dilated by passing 71 [place] Maloney dilator/ Prepyloric antral erythema but no evidence of erosions or peptic ulcer disease.   ESOPHAGOGASTRODUODENOSCOPY  09/03/2005   Small sliding hiatal hernia without  evidence of ring or  stricture formation. Esophagus dilated by passing 54-French Maloney dilator   given history of dysphagia   GIVENS CAPSULE STUDY  05/27/2008   Adequate acid suppression with proton pump inhibitor.   LEFT WRIST CYST REMOVAL     LUNG SURGERY for pulmonary nodules     Mass removed from roof of mouth     SAVORY DILATION  08/27/2011   stricture in the distal esophagus   UPPER GASTROINTESTINAL ENDOSCOPY  2006   X-STOP IMPLANTATION       reports that she quit smoking about 18 years ago. Her  smoking use included cigarettes. She has never used smokeless tobacco. She reports that she does not drink alcohol and does not use drugs.  No Known Allergies  Family History  Problem Relation Age of Onset   Colon cancer Neg Hx     Prior to Admission medications   Medication Sig Start Date End Date Taking? Authorizing Provider  acetaminophen (TYLENOL) 650 MG CR tablet Take 650 mg by mouth every 8 (eight) hours as needed for pain.   Yes [provider]  amlodipine-atorvastatin (CADUET) 10-20 MG tablet Take 1 tablet by mouth daily.   Yes [provider]  b complex vitamins capsule Take 1 capsule by mouth daily.   Yes [provider]  buPROPion (WELLBUTRIN XL) 150 MG 24 hr tablet Take 1 tablet by mouth every morning. 01/19/21  Yes [provider]  Carboxymethylcellulose Sodium (THERATEARS OP) Apply to eye 2 (two) times daily.   Yes [provider]  dicyclomine (BENTYL) 10 MG capsule Take 1 capsule (10 mg total) by mouth daily as needed. Patient taking differently: Take 10 mg by mouth daily as needed for spasms. 08/28/22  Yes Mahala Menghini, PA-C  LORazepam (ATIVAN) 1 MG tablet Take 1 mg by mouth at bedtime.    Yes [provider]  metoprolol tartrate (LOPRESSOR) 25 MG tablet Take 25 mg by mouth 2 (two) times daily. 03/16/20  Yes [provider]  Multiple Vitamin (MULTIVITAMIN WITH MINERALS) TABS tablet Take 1 tablet by mouth daily.   Yes [provider]  Multiple Vitamins-Minerals (PRESERVISION AREDS PO) Take by mouth daily.   Yes [provider]  pantoprazole (PROTONIX) 40 MG tablet Take 40 mg by mouth daily. 10/22/20  Yes [provider]  Venlafaxine HCl 225 MG TB24 Take 225 mg by mouth daily.    Yes [provider]  acetaminophen-codeine (TYLENOL #3) 300-30 MG tablet Take 1 tablet by mouth every 8 (eight) hours as needed for moderate pain. Patient not taking: Reported on 10/08/2022 04/04/20   Kathie Dike, MD  benzonatate (TESSALON) 100 MG capsule Take 1 capsule (100 mg total) by mouth every 8 (eight) hours. Patient not taking: Reported on 10/08/2022 07/25/22   Hendricks Limes, PA-C  famotidine (PEPCID) 20 MG tablet Take 1 tablet (20 mg total) by mouth daily as needed for heartburn or indigestion. Patient not taking: Reported on 10/08/2022 02/20/22   Mahala Menghini, PA-C    Physical Exam: Vitals:   10/08/22 1330 10/08/22 1400 10/08/22 1500 10/08/22 1505  BP: (!) 129/27 (!) 146/64 (!) 151/60   Pulse: 89 81 74   Resp: (!) 26 (!) 22 (!) 21   Temp:    98.3 F (36.8 C)  TempSrc:    Oral  SpO2: 98% 97% 95%   Weight:      Height:        Constitutional: NAD, calm, comfortable Vitals:  10/08/22 1330 10/08/22 1400 10/08/22 1500 10/08/22 1505  BP: (!) 129/27 (!) 146/64 (!) 151/60   Pulse: 89 81 74   Resp: (!) 26 (!) 22 (!) 21   Temp:    98.3 F (36.8 C)  TempSrc:    Oral  SpO2: 98% 97% 95%   Weight:      Height:       Eyes: PERRL, lids and conjunctivae normal ENMT: Mucous membranes are moist..  Neck: normal, supple, no masses, no thyromegaly Respiratory: clear to auscultation bilaterally, no wheezing, no crackles. Normal respiratory effort. No accessory muscle use.  Cardiovascular: Regular rate and rhythm, no murmurs / rubs / gallops. No extremity edema.  Extremities warm. Abdomen:, soft, No tenderness, no masses palpated. No hepatosplenomegaly. Bowel sounds positive.  Musculoskeletal: no clubbing / cyanosis. No joint deformity upper and lower extremities.  Skin: no rashes, lesions, ulcers. No induration Neurologic: No apparent cranial nerve abnormality, moving extremities spontaneously.  Psychiatric: Normal judgment and insight. Alert and oriented x 3. Normal mood.   Labs on Admission: I have personally reviewed following labs and imaging studies  CBC: Recent Labs  Lab 10/08/22 1144  WBC 3.6*  HGB 11.9*  HCT 36.3  MCV 89.0  PLT 106*   Basic Metabolic  Panel: Recent Labs  Lab 10/08/22 1144  NA 135  K 3.7  CL 102  CO2 24  GLUCOSE 98  BUN 13  CREATININE 0.70  CALCIUM 8.3*   GFR: Estimated Creatinine Clearance: 46.2 mL/min (by C-G formula based on SCr of 0.7 mg/dL). Liver Function Tests: Recent Labs  Lab 10/08/22 1144  AST 32  ALT 18  ALKPHOS 81  BILITOT 0.7  PROT 6.1*  ALBUMIN 3.4*   CBG: Recent Labs  Lab 10/08/22 1404  GLUCAP 71   Urine analysis:    Component Value Date/Time   COLORURINE YELLOW 10/08/2022 [place] 10/08/2022 1418   LABSPEC 1.019 10/08/2022 1418   PHURINE 5.0 10/08/2022 1418   GLUCOSEU NEGATIVE 10/08/2022 1418   [place] 10/08/2022 1418   BILIRUBINUR NEGATIVE 10/08/2022 1418   KETONESUR 20 (A) 10/08/2022 1418   PROTEINUR NEGATIVE 10/08/2022 1418   UROBILINOGEN 0.2 07/29/2015 2049   NITRITE POSITIVE (A) 10/08/2022 1418   LEUKOCYTESUR TRACE (A) 10/08/2022 1418    Radiological Exams on Admission: CT ABDOMEN PELVIS W CONTRAST  Result Date: 10/08/2022 CLINICAL DATA:  Abdominal pain EXAM: CT ABDOMEN AND PELVIS WITH CONTRAST TECHNIQUE: Multidetector CT imaging of the abdomen and pelvis was performed using the standard protocol following bolus administration of intravenous contrast. RADIATION DOSE REDUCTION: This exam was performed according to the departmental dose-optimization program which includes automated exposure control, adjustment of the mA and/or kV according to patient size and/or use of iterative reconstruction technique. CONTRAST:  129m OMNIPAQUE IOHEXOL 300 MG/ML  SOLN COMPARISON:  04/04/2020 FINDINGS: Lower chest: No pleural fluid or airspace disease. Hepatobiliary: No focal liver abnormality. No gallstones, gallbladder wall thickening or pericholecystic fluid. No significant bile duct dilatation. Pancreas: Unremarkable. No pancreatic ductal dilatation or surrounding inflammatory changes. Spleen: Again seen is a mass arising off the anterior aspect of the spleen which  measures 7.9 x 6.3 cm, image 25/2. Unchanged when compared with 04/04/2020. Adrenals/Urinary Tract: Normal adrenal glands. No nephrolithiasis or hydronephrosis. Urinary bladder is unremarkable. Stomach/Bowel: Small hiatal hernia. The appendix is visualized and appears within normal limits. No pathologic dilatation of the large or small bowel loops. The sigmoid diverticulosis without evidence for acute diverticulitis. Vascular/Lymphatic: Aortic atherosclerosis. No aneurysm. No abdominopelvic  adenopathy identified. Reproductive: Uterus and bilateral adnexa are unremarkable. Other: No free fluid or fluid collections. Musculoskeletal: No acute or suspicious osseous findings. Posterior hardware fixation and interbody fusion is been performed at the L3-4 level. Marked degenerative disc disease noted at L2-3 L4-5 and L5-S1. IMPRESSION: 1. No acute findings within the abdomen or pelvis. 2. Sigmoid diverticulosis without evidence for acute diverticulitis. 3. Stable appearance of mass arising off the anterior aspect of the spleen. Previously characterized as likely representing a benign splenic hamartoma. 4. Small hiatal hernia. 5.  Aortic Atherosclerosis (ICD10-I70.0). Electronically Signed   By: Kerby Moors M.D.   On: 10/08/2022 14:10   DG Chest 2 View  Result Date: 10/08/2022 CLINICAL DATA:  Cough. EXAM: CHEST - 2 VIEW COMPARISON:  July 25, 2022. FINDINGS: Stable cardiomediastinal silhouette. Lung is clear. Mild right upper lobe reticular densities are noted most consistent with scarring or subsegmental atelectasis. Bony thorax is unremarkable. IMPRESSION: Mild right upper lobe scarring or subsegmental atelectasis. Electronically Signed   By: Marijo Conception M.D.   On: 10/08/2022 13:34    EKG: Independently reviewed.  Sinus rhythm, rate 83.  QTc 425.  Nonspecific T wave abnormalities in lead - II, III, aVF V5 to V6 present on prior EKGs but more pronounced today.  Assessment/Plan Principal Problem:   Acute  metabolic encephalopathy Active Problems:   COVID-19 virus infection   UTI (urinary tract infection)   DEPRESSION/ANXIETY   Essential hypertension   Gastroparesis  Assessment and Plan: * Acute metabolic encephalopathy Disoriented, confused speech.  At baseline no significant memory problems.  Lives alone.  Baseline fecal incontinence.  No falls.  Very hard of hearing.  But answering questions appropriately, when daughter asked.  Encephalopathy in the setting of UTI and with infection. -Antibiotics, Paxlovid - N/s + 20 KCL 75cc/hr x 1 day -Resume venlafaxine and bupropion.  Daughter reports patient goes to withdrawal if she does not take her venlafaxine. -Resume Ativan 1 mg at bedtime  UTI (urinary tract infection) Presenting with encephalopathy.  Rules out for sepsis.  WBC 3.6.  Afebrile.  UA suggestive of UTI. - Follow up Urine cultures -IV ceftriaxone  COVID-19 virus infection Not hypoxic.  Chest x-ray clear.  Presenting with cough without dyspnea.  Received at least 2 or 3 doses of COVID-vaccine. -Paxlovid  Gastroparesis Presenting with nausea, reported LUQ pain.  Poor oral intake.  CT abdomen and pelvis negative for acute abnormality, suggest benign splenic hematoma.  Essential hypertension Resume amlodipine, metoprolol  DEPRESSION/ANXIETY Resume venlafaxine, bupropion   DVT prophylaxis: Lovenox Code Status: DNR-with daughter Whitney Galloway- HCPOA at bedside. Family Communication: Daughter Whitney Galloway at bedside is HCPOA. Disposition Plan: ~ 2 days Consults called: None Admission status:  Obs med surg   Author: Bethena Roys, MD 10/08/2022 7:01 PM  For on call review [email or web address].

## 2022-10-08 NOTE — ED Notes (Signed)
Pt ambulated with assistance of nurse tech to bed to room sink and back. Pt SPO2 93%-95% RA, HR 106.

## 2022-10-08 NOTE — Assessment & Plan Note (Addendum)
Resume metoprolol -Discontinued her amlodipine-blood pressure soft

## 2022-10-08 NOTE — Assessment & Plan Note (Addendum)
-  Persistent cough, without dyspnea, -Not hypoxic.  Chest x-ray clear.   -Received at least 2 or 3 doses of COVID-vaccine. -Continue p.o. Paxlovid

## 2022-10-08 NOTE — ED Triage Notes (Signed)
Pt BIB [facility] from home C/O generalized weakness, nausea, headache since Sunday.

## 2022-10-08 NOTE — Assessment & Plan Note (Addendum)
-  Patient is somewhat improved -Denied patient experiencing sundowning, receiving home medication of Ativan for sleep  -Strictly during day patient is more sleepy Empiric -stimulant Adderall was added today Mentation progressing to improve  POA: was Disoriented, confused speech.  At baseline no significant memory problems.   Lives alone. -  Baseline fecal incontinence.  No falls.  Very hard of hearing.  But answering questions appropriately,  - Encephalopathy in the setting of UTI and with infection. -Antibiotics, Paxlovid -Will continue N/s + 20 KCL 75cc/hr  -Resume venlafaxine and bupropion.  -Will continue venlafaxine -daughter reports patient goes to withdrawal if she does not take it  -Resume Ativan 1 mg at bedtime

## 2022-10-08 NOTE — Progress Notes (Signed)
Alert and oriented x 4.  Very HOH even with cochlear implant in left ear.  States has been coughing up white phlegm.  Stated daughter will bring glasses and upper dentures.  91% on room air

## 2022-10-09 DIAGNOSIS — M797 Fibromyalgia: Secondary | ICD-10-CM | POA: Diagnosis present

## 2022-10-09 DIAGNOSIS — R159 Full incontinence of feces: Secondary | ICD-10-CM | POA: Diagnosis present

## 2022-10-09 DIAGNOSIS — M810 Age-related osteoporosis without current pathological fracture: Secondary | ICD-10-CM | POA: Diagnosis present

## 2022-10-09 DIAGNOSIS — G9341 Metabolic encephalopathy: Secondary | ICD-10-CM | POA: Diagnosis not present

## 2022-10-09 DIAGNOSIS — K3184 Gastroparesis: Secondary | ICD-10-CM | POA: Diagnosis present

## 2022-10-09 DIAGNOSIS — U071 COVID-19: Secondary | ICD-10-CM | POA: Diagnosis present

## 2022-10-09 DIAGNOSIS — Z8719 Personal history of other diseases of the digestive system: Secondary | ICD-10-CM | POA: Diagnosis not present

## 2022-10-09 DIAGNOSIS — H9190 Unspecified hearing loss, unspecified ear: Secondary | ICD-10-CM | POA: Diagnosis present

## 2022-10-09 DIAGNOSIS — I10 Essential (primary) hypertension: Secondary | ICD-10-CM | POA: Diagnosis present

## 2022-10-09 DIAGNOSIS — F05 Delirium due to known physiological condition: Secondary | ICD-10-CM | POA: Diagnosis present

## 2022-10-09 DIAGNOSIS — N390 Urinary tract infection, site not specified: Secondary | ICD-10-CM | POA: Diagnosis present

## 2022-10-09 DIAGNOSIS — E876 Hypokalemia: Secondary | ICD-10-CM | POA: Diagnosis not present

## 2022-10-09 DIAGNOSIS — Z974 Presence of external hearing-aid: Secondary | ICD-10-CM | POA: Diagnosis not present

## 2022-10-09 DIAGNOSIS — K219 Gastro-esophageal reflux disease without esophagitis: Secondary | ICD-10-CM | POA: Diagnosis present

## 2022-10-09 DIAGNOSIS — R5381 Other malaise: Secondary | ICD-10-CM | POA: Insufficient documentation

## 2022-10-09 DIAGNOSIS — N3000 Acute cystitis without hematuria: Secondary | ICD-10-CM | POA: Diagnosis present

## 2022-10-09 DIAGNOSIS — F32A Depression, unspecified: Secondary | ICD-10-CM | POA: Diagnosis present

## 2022-10-09 DIAGNOSIS — F419 Anxiety disorder, unspecified: Secondary | ICD-10-CM | POA: Diagnosis present

## 2022-10-09 DIAGNOSIS — E871 Hypo-osmolality and hyponatremia: Secondary | ICD-10-CM | POA: Diagnosis not present

## 2022-10-09 DIAGNOSIS — Z66 Do not resuscitate: Secondary | ICD-10-CM | POA: Diagnosis present

## 2022-10-09 DIAGNOSIS — B9620 Unspecified Escherichia coli [E. coli] as the cause of diseases classified elsewhere: Secondary | ICD-10-CM | POA: Diagnosis present

## 2022-10-09 DIAGNOSIS — Z79899 Other long term (current) drug therapy: Secondary | ICD-10-CM | POA: Diagnosis not present

## 2022-10-09 DIAGNOSIS — Z602 Problems related to living alone: Secondary | ICD-10-CM | POA: Diagnosis present

## 2022-10-09 DIAGNOSIS — Z87891 Personal history of nicotine dependence: Secondary | ICD-10-CM | POA: Diagnosis not present

## 2022-10-09 LAB — CBC WITH DIFFERENTIAL/PLATELET
Abs Immature Granulocytes: 0.02 10*3/uL (ref 0.00–0.07)
Basophils Absolute: 0 10*3/uL (ref 0.0–0.1)
Basophils Relative: 0 %
Eosinophils Absolute: 0 10*3/uL (ref 0.0–0.5)
Eosinophils Relative: 0 %
HCT: 34.4 % — ABNORMAL LOW (ref 36.0–46.0)
Hemoglobin: 11.3 g/dL — ABNORMAL LOW (ref 12.0–15.0)
Immature Granulocytes: 1 %
Lymphocytes Relative: 29 %
Lymphs Abs: 1.1 10*3/uL (ref 0.7–4.0)
MCH: 28.9 pg (ref 26.0–34.0)
MCHC: 32.8 g/dL (ref 30.0–36.0)
MCV: 88 fL (ref 80.0–100.0)
Monocytes Absolute: 0.4 10*3/uL (ref 0.1–1.0)
Monocytes Relative: 11 %
Neutro Abs: 2.3 10*3/uL (ref 1.7–7.7)
Neutrophils Relative %: 59 %
Platelets: 132 10*3/uL — ABNORMAL LOW (ref 150–400)
RBC: 3.91 MIL/uL (ref 3.87–5.11)
RDW: 13.3 % (ref 11.5–15.5)
WBC: 3.8 10*3/uL — ABNORMAL LOW (ref 4.0–10.5)
nRBC: 0 % (ref 0.0–0.2)

## 2022-10-09 LAB — FERRITIN: Ferritin: 63 ng/mL (ref 11–307)

## 2022-10-09 LAB — COMPREHENSIVE METABOLIC PANEL
ALT: 17 U/L (ref 0–44)
AST: 31 U/L (ref 15–41)
Albumin: 3.1 g/dL — ABNORMAL LOW (ref 3.5–5.0)
Alkaline Phosphatase: 70 U/L (ref 38–126)
Anion gap: 8 (ref 5–15)
BUN: 15 mg/dL (ref 8–23)
CO2: 23 mmol/L (ref 22–32)
Calcium: 7.9 mg/dL — ABNORMAL LOW (ref 8.9–10.3)
Chloride: 101 mmol/L (ref 98–111)
Creatinine, Ser: 0.69 mg/dL (ref 0.44–1.00)
GFR, Estimated: >60 mL/min (ref >60)
Glucose, Bld: 80 mg/dL (ref 70–99)
Potassium: 3.6 mmol/L (ref 3.5–5.1)
Sodium: 132 mmol/L — ABNORMAL LOW (ref 135–145)
Total Bilirubin: 0.7 mg/dL (ref 0.3–1.2)
Total Protein: 5.7 g/dL — ABNORMAL LOW (ref 6.5–8.1)

## 2022-10-09 LAB — D-DIMER, QUANTITATIVE: D-Dimer, Quant: 0.47 ug/mL-FEU (ref 0.00–0.50)

## 2022-10-09 LAB — PHOSPHORUS: Phosphorus: 3.1 mg/dL (ref 2.5–4.6)

## 2022-10-09 LAB — C-REACTIVE PROTEIN
CRP: 2.4 mg/dL — ABNORMAL HIGH (ref <1.0)
CRP: 2.1 mg/dL — ABNORMAL HIGH (ref <1.0)

## 2022-10-09 LAB — LACTIC ACID, PLASMA: Lactic Acid, Venous: 0.7 mmol/L (ref 0.5–1.9)

## 2022-10-09 LAB — MAGNESIUM: Magnesium: 1.9 mg/dL (ref 1.7–2.4)

## 2022-10-09 MED ORDER — GUAIFENESIN-DM 100-10 MG/5ML PO SYRP
10.0000 mL | ORAL_SOLUTION | Freq: Three times a day (TID) | ORAL | Status: DC
  Administered 2022-10-09 – 2022-10-15 (×9): 10 mL via ORAL
  Filled 2022-10-09 (×12): qty 10

## 2022-10-09 MED ORDER — RISAQUAD PO CAPS
2.0000 | ORAL_CAPSULE | Freq: Three times a day (TID) | ORAL | Status: DC
  Administered 2022-10-09 – 2022-10-15 (×14): 2 via ORAL
  Filled 2022-10-09 (×14): qty 2

## 2022-10-09 MED ORDER — METOPROLOL TARTRATE 50 MG PO TABS
50.0000 mg | ORAL_TABLET | Freq: Two times a day (BID) | ORAL | Status: DC
  Administered 2022-10-09 – 2022-10-13 (×7): 50 mg via ORAL
  Filled 2022-10-09 (×8): qty 1

## 2022-10-09 MED ORDER — ACETAMINOPHEN 325 MG PO TABS: 650.0000 mg | ORAL_TABLET | Freq: Four times a day (QID) | ORAL | Status: DC | PRN

## 2022-10-09 NOTE — Progress Notes (Signed)
PROGRESS NOTE    Patient: Whitney Galloway [facility]                            PCP: Celene Squibb, MD                    DOB: Apr 18, 1929            DOA: 10/08/2022 ACZ:[id number]             DOS: 10/09/2022, 2:08 PM   LOS: 0 days   Date of Service: The patient was seen and examined on 10/09/2022  Subjective:   The patient was seen and examined this morning. Hemodynamically stable, complaining of poor memory, dentalized weakness this Denies any chest pain  Brief Narrative:   MALAIKA ARNALL is a 89 y.o. female with medical history significant for hypertension, depression and anxiety, hearing impairment. Patient was brought to the ED reports of weakness, nausea and confusion.  Patient is very hard of hearing, has history from patient is limited, daughter at bedside assist with history.  Patient lives alone without significant memory problems.  Patient's daughter reports that patient has been weak, with nausea and poor oral intake, headache over the past 2 days.  She is not aware of abdominal pain.  No diarrhea.  No vomiting. She has had a cough, but no difficulty breathing.  She is unaware of any falls. Today patient became confused, disoriented with confused speech.  Daughter was concerned for UTI as she has had similar presentation in the past.   ED Course: Tmax 98.4.  Pulse rate 70-92.  Respiratory rate 14-26.  Blood pressure systolic 109-323.  O2 sats 91-98 % on room air. COVID test positive. UA with trace leukocytes and positive nitrites.  Chest x-ray negative for acute abnormality.  CT abdomen and pelvis - without acute abnormality, suggest benign splenic hematoma. Urine cultures obtained IV ceftriaxone given.    Assessment & Plan:   Principal Problem:   Acute metabolic encephalopathy Active Problems:   COVID-19 virus infection   UTI (urinary tract infection)   Debility   DEPRESSION/ANXIETY   Essential hypertension   Gastroparesis     Assessment and Plan: * Acute metabolic  encephalopathy -Patient is somewhat improved Disoriented, confused speech.  At baseline no significant memory problems.  Lives alone. -  Baseline fecal incontinence.  No falls.  Very hard of hearing.  But answering questions appropriately,  - Encephalopathy in the setting of UTI and with infection. -Antibiotics, Paxlovid -Will continue N/s + 20 KCL 75cc/hr  -Resume venlafaxine and bupropion.  -Will continue venlafaxine -daughter reports patient goes to withdrawal if she does not take it  -Resume Ativan 1 mg at bedtime  UTI (urinary tract infection) Presenting with encephalopathy.  Rules out for sepsis.  WBC 3.6.  Afebrile.  UA suggestive of UTI. - Follow up Urine cultures -IV ceftriaxone  COVID-19 virus infection -Not hypoxic.  Chest x-ray clear.   -Presenting with cough without dyspnea.   -Received at least 2 or 3 doses of COVID-vaccine. -Continue p.o. Paxlovid  Debility - Consulted PT/OT-recommending SNF -Patient and family agreeable -UC consult, pursuing SNF placement  Gastroparesis -Tolerating p.o., improved nausea vomiting abdominal pain - POA: Presenting with nausea, reported LUQ pain.   - Poor oral intake.   - CT abdomen and pelvis negative for acute abnormality, suggest benign splenic hematoma.  Essential hypertension Resume amlodipine, metoprolol  DEPRESSION/ANXIETY Resume venlafaxine, bupropion   ---------------------------------------------------------------------------------------------------------------------------------------- Nutritional  status:  The patient's BMI is: Body mass index is 22.81 kg/m. I agree with the assessment and plan as outlined -----------------------------------------------------------------------------------------------------------------------------------------  DVT prophylaxis:  enoxaparin (LOVENOX) injection 40 mg Start: 10/08/22 2000   Code Status:   Code Status: DNR  Family Communication: No family member present at bedside-  attempt will be made to update daily The above findings and plan of care has been discussed with patient (and family)  in detail,  they expressed understanding and agreement of above. -Advance care planning has been discussed.   Admission status:   Status is: Inpatient -Patient meets inpatient criteria due to severe debility, confusion.  Needing IV fluids, medication adjustments..    Procedures:   No admission procedures for hospital encounter.   Antimicrobials:  Anti-infectives (From admission, onward)    Start     Dose/Rate Route Frequency Ordered Stop   10/09/22 1500  cefTRIAXone (ROCEPHIN) 1 g in sodium chloride 0.9 % 100 mL IVPB        1 g 200 mL/hr over 30 Minutes Intravenous Every 24 hours 10/08/22 2223     10/09/22 0000  nirmatrelvir/ritonavir (renal dosing) (PAXLOVID) 2 tablet        2 tablet Oral 2 times daily 10/08/22 2301 10/13/22 2159   10/08/22 2200  nirmatrelvir/ritonavir (PAXLOVID) 3 tablet  Status:  Discontinued        3 tablet Oral 2 times daily 10/08/22 1901 10/08/22 2300   10/08/22 1530  cefTRIAXone (ROCEPHIN) 1 g in sodium chloride 0.9 % 100 mL IVPB        1 g 200 mL/hr over 30 Minutes Intravenous  Once 10/08/22 1525 10/08/22 1634        Medication:   acidophilus  2 capsule Oral TID   buPROPion  150 mg Oral q morning   enoxaparin (LOVENOX) injection  40 mg Subcutaneous Q24H   guaiFENesin-dextromethorphan  10 mL Oral Q8H   LORazepam  1 mg Oral QHS   metoprolol tartrate  50 mg Oral BID   nirmatrelvir/ritonavir (renal dosing)  2 tablet Oral BID   venlafaxine XR  225 mg Oral Daily    acetaminophen, dicyclomine, ondansetron **OR** ondansetron (ZOFRAN) IV, polyethylene glycol   Objective:   Vitals:   10/08/22 2111 10/09/22 0206 10/09/22 0610 10/09/22 0838  BP: (!) 141/62 (!) 122/53 (!) 127/56 138/61  Pulse: 81 68 72 69  Resp: 18 (!) 24 18   Temp: 100.2 F (37.9 C) (!) 100.4 F (38 C) 98.7 F (37.1 C)   TempSrc: Oral Oral    SpO2: 94% 94% 94%    Weight:      Height:        Intake/Output Summary (Last 24 hours) at 10/09/2022 1408 Last data filed at 10/09/2022 1100 Gross per 24 hour  Intake 965.8 ml  Output 0 ml  Net 965.8 ml   Filed Weights   10/08/22 1108  Weight: 68 kg     Examination:   Physical Exam  Constitution:  Alert, cooperative, no distress,  Appears calm and comfortable  Psychiatric:   Normal and stable mood and affect, cognition intact,   HEENT:        Normocephalic, PERRL, otherwise with in Normal limits  Chest:         Chest symmetric Cardio vascular:  S1/S2, RRR, No murmure, No Rubs or Gallops  pulmonary: Clear to auscultation bilaterally, respirations unlabored, negative wheezes / crackles Abdomen: Soft, non-tender, non-distended, bowel sounds,no masses, no organomegaly Muscular skeletal: Limited exam - in bed, able  to move all 4 extremities,   Neuro: CNII-XII intact. , normal motor and sensation, reflexes intact  Extremities: No pitting edema lower extremities, +2 pulses  Skin: Dry, warm to touch, negative for any Rashes, No open wounds Wounds: per nursing documentation   ------------------------------------------------------------------------------------------------------------------------------------------    LABs:     Latest Ref Rng & Units 10/09/2022    4:47 AM 10/08/2022   11:44 AM 07/25/2022    4:06 PM  CBC  WBC 4.0 - 10.5 K/uL 3.8  3.6  8.0   Hemoglobin 12.0 - 15.0 g/dL 11.3  11.9  13.1   Hematocrit 36.0 - 46.0 % 34.4  36.3  39.5   Platelets 150 - 400 K/uL 132  148  240       Latest Ref Rng & Units 10/09/2022    4:47 AM 10/08/2022   11:44 AM 07/25/2022    4:06 PM  CMP  Glucose 70 - 99 mg/dL 80  98  116   BUN 8 - 23 mg/dL '15  13  15   '$ Creatinine 0.44 - 1.00 mg/dL 0.69  0.70  0.87   Sodium 135 - 145 mmol/L 132  135  138   Potassium 3.5 - 5.1 mmol/L 3.6  3.7  3.7   Chloride 98 - 111 mmol/L 101  102  104   CO2 22 - 32 mmol/L '23  24  24   '$ Calcium 8.9 - 10.3 mg/dL 7.9  8.3  8.7   Total  Protein 6.5 - 8.1 g/dL 5.7  6.1    Total Bilirubin 0.3 - 1.2 mg/dL 0.7  0.7    Alkaline Phos 38 - 126 U/L 70  81    AST 15 - 41 U/L 31  32    ALT 0 - 44 U/L 17  18         Micro Results Recent Results (from the past 240 hour(s))  Resp panel by RT-PCR (RSV, Flu A&B, Covid) Anterior Nasal Swab     Status: Abnormal   Collection Time: 10/08/22 11:24 AM   Specimen: Anterior Nasal Swab  Result Value Ref Range Status   SARS Coronavirus 2 by RT PCR POSITIVE (A) NEGATIVE Final    Comment: (NOTE) SARS-CoV-2 target nucleic acids are DETECTED.  The SARS-CoV-2 RNA is generally detectable in upper respiratory specimens during the acute phase of infection. Positive results are indicative of the presence of the identified virus, but do not rule out bacterial infection or co-infection with other pathogens not detected by the test. Clinical correlation with patient history and other diagnostic information is necessary to determine patient infection status. The expected result is Negative.  Fact Sheet for Patients: EntrepreneurPulse.com.au  Fact Sheet for Healthcare Providers: IncredibleEmployment.be  This test is not yet approved or cleared by the [place] FDA and  has been authorized for detection and/or diagnosis of SARS-CoV-2 by FDA under an Emergency Use Authorization (EUA).  This EUA will remain in effect (meaning this test can be used) for the duration of  the COVID-19 declaration under Section 564(b)(1) of the A ct, 21 U.S.C. section 360bbb-3(b)(1), unless the authorization is terminated or revoked sooner.     Influenza A by PCR NEGATIVE NEGATIVE Final   Influenza B by PCR NEGATIVE NEGATIVE Final    Comment: (NOTE) The Xpert Xpress SARS-CoV-2/FLU/RSV plus assay is intended as an aid in the diagnosis of influenza from Nasopharyngeal swab specimens and should not be used as a sole basis for treatment. Nasal washings and aspirates are  unacceptable  for Xpert Xpress SARS-CoV-2/FLU/RSV testing.  Fact Sheet for Patients: EntrepreneurPulse.com.au  Fact Sheet for Healthcare Providers: IncredibleEmployment.be  This test is not yet approved or cleared by the [place] FDA and has been authorized for detection and/or diagnosis of SARS-CoV-2 by FDA under an Emergency Use Authorization (EUA). This EUA will remain in effect (meaning this test can be used) for the duration of the COVID-19 declaration under Section 564(b)(1) of the Act, 21 U.S.C. section 360bbb-3(b)(1), unless the authorization is terminated or revoked.     Resp Syncytial Virus by PCR NEGATIVE NEGATIVE Final    Comment: (NOTE) Fact Sheet for Patients: EntrepreneurPulse.com.au  Fact Sheet for Healthcare Providers: IncredibleEmployment.be  This test is not yet approved or cleared by the [place] FDA and has been authorized for detection and/or diagnosis of SARS-CoV-2 by FDA under an Emergency Use Authorization (EUA). This EUA will remain in effect (meaning this test can be used) for the duration of the COVID-19 declaration under Section 564(b)(1) of the Act, 21 U.S.C. section 360bbb-3(b)(1), unless the authorization is terminated or revoked.  Performed at [facility], [address] [place], [place] [address]     Radiology Reports No results found.  SIGNED: Deatra James, MD, FHM. [facility],  Pager (please use [email or web address] to page/text) Please use Epic Secure Chat for non-urgent communication (7AM-7PM)  If 7PM-7AM, please contact night-coverage [email or web address], 10/09/2022, 2:08 PM

## 2022-10-09 NOTE — Hospital Course (Signed)
Whitney Galloway is a 89 y.o. female with medical history significant for hypertension, depression and anxiety, hearing impairment. Patient was brought to the ED reports of weakness, nausea and confusion.  Patient is very hard of hearing, has history from patient is limited, daughter at bedside assist with history.  Patient lives alone without significant memory problems.  Patient's daughter reports that patient has been weak, with nausea and poor oral intake, headache over the past 2 days.  She is not aware of abdominal pain.  No diarrhea.  No vomiting. She has had a cough, but no difficulty breathing.  She is unaware of any falls. Today patient became confused, disoriented with confused speech.  Daughter was concerned for UTI as she has had similar presentation in the past.   ED Course: Tmax 98.4.  Pulse rate 70-92.  Respiratory rate 14-26.  Blood pressure systolic 696-295.  O2 sats 91-98 % on room air. COVID test positive. UA with trace leukocytes and positive nitrites.  Chest x-ray negative for acute abnormality.  CT abdomen and pelvis - without acute abnormality, suggest benign splenic hematoma. Urine cultures obtained IV ceftriaxone given.

## 2022-10-09 NOTE — Care Management Obs Status (Signed)
[place] NOTIFICATION   Patient Details  Name: Whitney Galloway MRN: [id number] Date of Birth: [birth year]   Medicare Observation Status Notification Given:  Yes (spoke with daughter Jeannene Patella at [phone], copy will be provided.)    Tommy Medal 10/09/2022, 10:20 AM

## 2022-10-09 NOTE — Assessment & Plan Note (Addendum)
-   Consulted PT/OT-recommending SNF  -patient can be discharged this Monday, 10/14/2022 -Patient and family agreeable -TOC consult, pursuing SNF placement

## 2022-10-09 NOTE — Plan of Care (Signed)
  Problem: Acute Rehab PT Goals(only PT should resolve) Goal: Patient Will Transfer Sit To/From Stand Outcome: Progressing Flowsheets (Taken 10/09/2022 1400) Patient will transfer sit to/from stand:  with min guard assist  with supervision Goal: Pt Will Transfer Bed To Chair/Chair To Bed Outcome: Progressing Flowsheets (Taken 10/09/2022 1400) Pt will Transfer Bed to Chair/Chair to Bed:  min guard assist  with supervision Goal: Pt Will Ambulate Outcome: Progressing Flowsheets (Taken 10/09/2022 1400) Pt will Ambulate:  25 feet  with min guard assist  with rolling walker Goal: Pt/caregiver will Perform Home Exercise Program Outcome: Progressing Flowsheets (Taken 10/09/2022 1400) Pt/caregiver will Perform Home Exercise Program:  For increased strengthening  For improved balance  With Supervision, verbal cues required/provided  2:01 PM, 10/09/22 Mearl Latin PT, DPT Physical Therapist at [facility]

## 2022-10-09 NOTE — Progress Notes (Addendum)
Patient has not urinated much all day. Patient was also complaining of some pain in her lower abdomen. Completed a bladder scan, which showed 469 mL. MD Shahmehdi notified. MD Shahmehdi states to complete in and out cath. In and out cath completed with 400 ml of urine obtained.

## 2022-10-09 NOTE — Progress Notes (Signed)
patient has had 4 loose stools today. Was going to get a sample, but patient does it in the bed and it is mixed with urine. She states she can't tell when it is coming out. MD Shahmehdi notified.

## 2022-10-09 NOTE — NC FL2 (Signed)
[place] LEVEL OF CARE FORM     IDENTIFICATION  Patient Name: Whitney Galloway [facility] Birthdate: Dec 29, 1928 Sex: female Admission Date (Current Location): 10/08/2022  [facility] and [place] Number:  [employer] and Address:  [place] [address] [place]      Provider Number: [id number]  Attending Physician Name and Address:  Deatra James, MD  Relative Name and Phone Number:       Current Level of Care: Hospital Recommended Level of Care: [place] Prior Approval Number:    Date Approved/Denied:   PASRR Number: [id number]  Discharge Plan: SNF    Current Diagnoses: Patient Active Problem List   Diagnosis Date Noted   Debility 93/26/7124   Acute metabolic encephalopathy 58/06/9832   COVID-19 virus infection 10/08/2022   UTI (urinary tract infection) 10/08/2022   Acute encephalopathy 04/03/2020   Altered mental status 04/02/2020   Hypokalemia 04/02/2020   Closed nondisplaced fracture of lateral malleolus of right fibula 09/27/2019   Postop check 09/21/2014   Breast cancer, 1971, status post right lumpectomy 01/04/2014   Hemorrhoids, internal, with bleeding 10/14/2013   Anemia 09/09/2013   History of spinal surgery 12/17/2012   Difficulty hearing 12/17/2012   Herniated nucleus pulposus, L3-4 with stenosis and spondylolisthesis 10/20/2012   Night sweats 12/26/2011   Stiffness of joints, not elsewhere classified, multiple sites 12/19/2011   Weakness of left leg 12/19/2011   Difficulty in walking(719.7) 12/19/2011   Gastroparesis 11/03/2009   COLONIC POLYPS, ADENOMATOUS 06/23/2008   UNSPECIFIED DISEASE OF SPLEEN 06/23/2008   DEPRESSION/ANXIETY 06/23/2008   Essential hypertension 06/23/2008   ALLERGIC RHINITIS, SEASONAL 06/23/2008   Gastroesophageal reflux disease without esophagitis 06/23/2008   DIVERTICULOSIS OF COLON 06/23/2008   Irritable bowel syndrome with both constipation and diarrhea  06/23/2008   ECZEMA 06/23/2008   OSTEOARTHRITIS, SPINE 06/23/2008   DEGENERATIVE DISC DISEASE 06/23/2008   OTHER DYSPHAGIA 06/23/2008   Incontinence of feces 06/23/2008   TOBACCO USE, QUIT 06/23/2008    Orientation RESPIRATION BLADDER Height & Weight     Self, Situation, Place, Time  Normal External catheter Weight: 150 lb (68 kg) Height:  '5\' 8"'$  (172.7 cm)  BEHAVIORAL SYMPTOMS/MOOD NEUROLOGICAL BOWEL NUTRITION STATUS      Incontinent Diet (Regular. See d/c summary for updates.)  AMBULATORY STATUS COMMUNICATION OF NEEDS Skin   Extensive Assist Verbally Normal                       Personal Care Assistance Level of Assistance  Bathing, Feeding, Dressing Bathing Assistance: Maximum assistance Feeding assistance: Limited assistance Dressing Assistance: Maximum assistance     Functional Limitations Info  Sight, Hearing, Speech Sight Info: Impaired Hearing Info: Impaired Speech Info: Adequate    SPECIAL CARE FACTORS FREQUENCY  PT (By licensed PT)     PT Frequency: 5x weekly              Contractures      Additional Factors Info  Code Status, Allergies, Isolation Precautions, Psychotropic Code Status Info: DNR Allergies Info: No known allergies Psychotropic Info: Wellbutrin, Ativan   Isolation Precautions Info: Airborne/contact precautions. COVID + 10/08/22.     Current Medications (10/09/2022):  This is the current hospital active medication list Current Facility-Administered Medications  Medication Dose Route Frequency Provider Last Rate Last Admin   0.9 % NaCl with KCl 20 mEq/ L  infusion   Intravenous Continuous Emokpae, Ejiroghene E, MD   Stopped at 10/09/22 1352  acetaminophen (TYLENOL) tablet 650 mg  650 mg Oral Q6H PRN Adefeso, Oladapo, DO       acidophilus (RISAQUAD) capsule 2 capsule  2 capsule Oral TID Skipper Cliche A, MD   2 capsule at 10/09/22 0843   buPROPion (WELLBUTRIN XL) 24 hr tablet 150 mg  150 mg Oral q morning Emokpae, Ejiroghene E, MD    150 mg at 10/09/22 0833   cefTRIAXone (ROCEPHIN) 1 g in sodium chloride 0.9 % 100 mL IVPB  1 g Intravenous Q24H Emokpae, Ejiroghene E, MD 200 mL/hr at 10/09/22 1358 1 g at 10/09/22 1358   dicyclomine (BENTYL) capsule 10 mg  10 mg Oral Daily PRN Emokpae, Ejiroghene E, MD       enoxaparin (LOVENOX) injection 40 mg  40 mg Subcutaneous Q24H Emokpae, Ejiroghene E, MD   40 mg at 10/08/22 2116   guaiFENesin-dextromethorphan (ROBITUSSIN DM) 100-10 MG/5ML syrup 10 mL  10 mL Oral Q8H Shahmehdi, Seyed A, MD   10 mL at 10/09/22 1354   LORazepam (ATIVAN) tablet 1 mg  1 mg Oral QHS Emokpae, Ejiroghene E, MD   1 mg at 10/08/22 2116   metoprolol tartrate (LOPRESSOR) tablet 50 mg  50 mg Oral BID Skipper Cliche A, MD   50 mg at 10/09/22 0350   nirmatrelvir/ritonavir (renal dosing) (PAXLOVID) 2 tablet  2 tablet Oral BID Emokpae, Ejiroghene E, MD   2 tablet at 10/09/22 0833   ondansetron (ZOFRAN) tablet 4 mg  4 mg Oral Q6H PRN Emokpae, Ejiroghene E, MD       Or   ondansetron (ZOFRAN) injection 4 mg  4 mg Intravenous Q6H PRN Emokpae, Ejiroghene E, MD       polyethylene glycol (MIRALAX / GLYCOLAX) packet 17 g  17 g Oral Daily PRN Emokpae, Ejiroghene E, MD       venlafaxine XR (EFFEXOR-XR) 24 hr capsule 225 mg  225 mg Oral Daily Emokpae, Ejiroghene E, MD   225 mg at 10/09/22 0938     Discharge Medications: Please see discharge summary for a list of discharge medications.  Relevant Imaging Results:  Relevant Lab Results:   Additional Information SSN: [id number]  Salome Arnt, LCSW

## 2022-10-09 NOTE — Evaluation (Signed)
Physical Therapy Evaluation Patient Details Name: Whitney Galloway MRN: [id number] DOB: 03/20/1929 Today's Date: 10/09/2022  History of Present Illness  Whitney Galloway is a 89 y.o. female with medical history significant for hypertension, depression and anxiety, hearing impairment.  Patient was brought to the ED reports of weakness, nausea and confusion.  Patient is very hard of hearing, has history from patient is limited, daughter at bedside assist with history.  Patient lives alone without significant memory problems.  Patient's daughter reports that patient has been weak, with nausea and poor oral intake, headache over the past 2 days.  She is not aware of abdominal pain.  No diarrhea.  No vomiting.  She has had a cough, but no difficulty breathing.  She is unaware of any falls.  Today patient became confused, disoriented with confused speech.  Daughter was concerned for UTI as she has had similar presentation in the past.   Clinical Impression  Patient limited for functional mobility as stated below secondary to BLE weakness, fatigue and impaired standing balance. Patient soiled upon entrance for session. Patient assisted to EOB and and transfer to standing with HHA while nursing cleaned up patient. Patient requiring intermittent rest breaks throughout session due to fatigue. Patient ambulates in room with RW with assist but fatigues quickly. Patient required cleaning of stool several times during session and ends session seated on [facility] with daughter present and RN aware. Patient will benefit from continued physical therapy in hospital and recommended venue below to increase strength, balance, endurance for safe ADLs and gait.        Recommendations for follow up therapy are one component of a multi-disciplinary discharge planning process, led by the attending physician.  Recommendations may be updated based on patient status, additional functional criteria and insurance authorization.  Follow Up  Recommendations Skilled nursing-short term rehab (<3 hours/day) Can patient physically be transported by private vehicle: Yes    Assistance Recommended at Discharge Frequent or constant Supervision/Assistance  Patient can return home with the following  A little help with walking and/or transfers;A little help with bathing/dressing/bathroom    Equipment Recommendations None recommended by PT  Recommendations for Other Services       Functional Status Assessment Patient has had a recent decline in their functional status and demonstrates the ability to make significant improvements in function in a reasonable and predictable amount of time.     Precautions / Restrictions Precautions Precautions: Fall Restrictions Weight Bearing Restrictions: No      Mobility  Bed Mobility Overal bed mobility: Needs Assistance Bed Mobility: Supine to Sit     Supine to sit: Min assist     General bed mobility comments: labored, assist to pull to seated EOB    Transfers Overall transfer level: Needs assistance Equipment used: Rolling walker (2 wheels), 1 person hand held assist Transfers: Sit to/from Stand, Bed to chair/wheelchair/BSC Sit to Stand: Min assist   Step pivot transfers: Min assist       General transfer comment: able to transfer from STS with HHA and with RW with min assist required each time to power up to standing and for standing balance    Ambulation/Gait Ambulation/Gait assistance: Min assist Gait Distance (Feet): 8 Feet Assistive device: Rolling walker (2 wheels) Gait Pattern/deviations: Step-through pattern, Decreased stride length, Trunk flexed Gait velocity: decreased     General Gait Details: ambulates in room with RW  Science writer  Modified Rankin (Stroke Patients Only)       Balance Overall balance assessment: Needs assistance Sitting-balance support: No upper extremity supported, Feet supported Sitting  balance-Leahy Scale: Good Sitting balance - Comments: seated EOB   Standing balance support: Bilateral upper extremity supported, Reliant on assistive device for balance Standing balance-Leahy Scale: Fair                               Pertinent Vitals/Pain Pain Assessment Pain Assessment: No/denies pain    Home Living Family/patient expects to be discharged to:: Private residence Living Arrangements: Alone Available Help at Discharge: Family Type of Home: House Home Access: Stairs to enter Entrance Stairs-Rails: Psychiatric nurse of Steps: 4   Home Layout: One level Home Equipment: Conservation officer, nature (2 wheels);Cane - single point      Prior Function Prior Level of Function : Needs assist             Mobility Comments: household ambulation ADLs Comments: had aid PTA, family assists PRN     Hand Dominance        Extremity/Trunk Assessment   Upper Extremity Assessment Upper Extremity Assessment: Generalized weakness    Lower Extremity Assessment Lower Extremity Assessment: Generalized weakness    Cervical / Trunk Assessment Cervical / Trunk Assessment: Kyphotic  Communication   Communication: HOH  Cognition Arousal/Alertness: Awake/alert Behavior During Therapy: WFL for tasks assessed/performed Overall Cognitive Status: Within Functional Limits for tasks assessed                                          General Comments      Exercises     Assessment/Plan    PT Assessment Patient needs continued PT services  PT Problem List Decreased strength;Decreased mobility;Decreased activity tolerance;Decreased balance       PT Treatment Interventions DME instruction;Therapeutic exercise;Balance training;Gait training;Stair training;Neuromuscular re-education;Functional mobility training;Patient/family education;Therapeutic activities    PT Goals (Current goals can be found in the Care Plan section)  Acute Rehab PT  Goals Patient Stated Goal: return home PT Goal Formulation: With patient Time For Goal Achievement: 10/23/22 Potential to Achieve Goals: Good    Frequency Min 3X/week     Co-evaluation               AM-PAC PT "6 Clicks" Mobility  Outcome Measure Help needed turning from your back to your side while in a flat bed without using bedrails?: None Help needed moving from lying on your back to sitting on the side of a flat bed without using bedrails?: A Little Help needed moving to and from a bed to a chair (including a wheelchair)?: A Little Help needed standing up from a chair using your arms (e.g., wheelchair or bedside chair)?: A Little Help needed to walk in hospital room?: A Lot Help needed climbing 3-5 steps with a railing? : A Lot 6 Click Score: 17    End of Session   Activity Tolerance: Patient tolerated treatment well Patient left: with family/visitor present;with call bell/phone within reach (on [facility]) Nurse Communication: Mobility status;Other (comment) (patient on [facility] with daughter present) PT Visit Diagnosis: Unsteadiness on feet (R26.81);Other abnormalities of gait and mobility (R26.89);Muscle weakness (generalized) (M62.81)    Time: 7124-5809 PT Time Calculation (min) (ACUTE ONLY): 39 min   Charges:   PT Evaluation $PT Eval Low Complexity: 1  Low PT Treatments $Therapeutic Activity: 23-37 mins        1:52 PM, 10/09/22 Mearl Latin PT, DPT Physical Therapist at [facility]

## 2022-10-09 NOTE — TOC Initial Note (Signed)
Transition of Care [facility]) - Initial/Assessment Note    Patient Details  Name: Whitney Galloway MRN: [id number] Date of Birth: 1928-12-31  [facility] CM/SW Contact:    Salome Arnt, LCSW Phone Number: 10/09/2022, 1:59 PM  Clinical Narrative:  Pt admitted due to acute metabolic encephalopathy, UTI, and COVID-19. Assessment completed with pt's daughter as pt unable to hear on phone. Pt's daughter reports pt lives alone and manages well at baseline. She has a caregiver 2 days a week, for several hours. Pt's caregiver also has COVID. PT evaluated pt and recommend SNF. Pt has been to SNF in the past and daughter indicates she will need to convince pt to go. Daughter is aware pt will have to agree. Request [place] or [facility]. Medicare.gov ratings reviewed. Will initiate bed search.                 Expected Discharge Plan: Skilled Nursing Facility Barriers to Discharge: Continued Medical Work up   Patient Goals and CMS Choice Patient states their goals for this hospitalization and ongoing recovery are:: short term rehab   Choice offered to / list presented to : Adult [place] ownership interest in [facility].provided to::  (N/A)    Expected Discharge Plan and Services In-house Referral: Clinical Social Work   Post Acute Care Choice: [place] Living arrangements for the past 2 months: [place]                                      Prior Living Arrangements/Services Living arrangements for the past 2 months: Single Family Home Lives with:: Self Patient language and need for interpreter reviewed:: Yes Do you feel safe going back to the place where you live?: Yes          Current home services: DME (Cane, walker, 3N1) Criminal Activity/Legal Involvement Pertinent to Current Situation/Hospitalization: No - Comment as needed  Activities of Daily Living Home Assistive Devices/Equipment: Cane (specify quad  or straight) ADL Screening (condition at time of admission) Patient's cognitive ability adequate to safely complete daily activities?: Yes Is the patient deaf or have difficulty hearing?: Yes Does the patient have difficulty seeing, even when wearing glasses/contacts?: No Does the patient have difficulty concentrating, remembering, or making decisions?: No Patient able to express need for assistance with ADLs?: Yes Does the patient have difficulty dressing or bathing?: No Independently performs ADLs?: Yes (appropriate for developmental age) Does the patient have difficulty walking or climbing stairs?: No Weakness of Legs: Both Weakness of Arms/Hands: None  Permission Sought/Granted                  Emotional Assessment       Orientation: : Oriented to Self, Oriented to Place, Oriented to  Time, Oriented to Situation Alcohol / Substance Use: Not Applicable Psych Involvement: No (comment)  Admission diagnosis:  Acute cystitis without hematuria [V25.36] Acute metabolic encephalopathy [U44.03] COVID-19 [U07.1] Patient Active Problem List   Diagnosis Date Noted   Acute metabolic encephalopathy 47/42/5956   COVID-19 virus infection 10/08/2022   UTI (urinary tract infection) 10/08/2022   Acute encephalopathy 04/03/2020   Altered mental status 04/02/2020   Hypokalemia 04/02/2020   Closed nondisplaced fracture of lateral malleolus of right fibula 09/27/2019   Postop check 09/21/2014   Breast cancer, 1971, status post right lumpectomy 01/04/2014   Hemorrhoids, internal, with bleeding 10/14/2013  Anemia 09/09/2013   History of spinal surgery 12/17/2012   Difficulty hearing 12/17/2012   Herniated nucleus pulposus, L3-4 with stenosis and spondylolisthesis 10/20/2012   Night sweats 12/26/2011   Stiffness of joints, not elsewhere classified, multiple sites 12/19/2011   Weakness of left leg 12/19/2011   Difficulty in walking(719.7) 12/19/2011   Gastroparesis 11/03/2009   COLONIC  POLYPS, ADENOMATOUS 06/23/2008   UNSPECIFIED DISEASE OF SPLEEN 06/23/2008   DEPRESSION/ANXIETY 06/23/2008   Essential hypertension 06/23/2008   ALLERGIC RHINITIS, SEASONAL 06/23/2008   Gastroesophageal reflux disease without esophagitis 06/23/2008   DIVERTICULOSIS OF COLON 06/23/2008   Irritable bowel syndrome with both constipation and diarrhea 06/23/2008   ECZEMA 06/23/2008   OSTEOARTHRITIS, SPINE 06/23/2008   DEGENERATIVE [place] DISEASE 06/23/2008   OTHER DYSPHAGIA 06/23/2008   Incontinence of feces 06/23/2008   TOBACCO USE, QUIT 06/23/2008   PCP:  Celene Squibb, MD Pharmacy:   [facility] - [place], [place] [address] Phone: [phone] Fax: [phone]  [place] [address], [place] - [place] [address] [place] [address] [place] [address] Phone: [phone] Fax: [phone]  [facility] [place], [place] - [address] [place] [address] Phone: [phone] Fax: [phone]     Social Determinants of Health ([place]) Social History: [place]: No Food Insecurity (10/08/2022)  Housing: Low Risk  (10/08/2022)  Transportation Needs: No Transportation Needs (10/08/2022)  Utilities: Not At Risk (10/08/2022)  Alcohol Screen: Low Risk  (09/05/2020)  Depression (PHQ2-9): Medium Risk (09/05/2020)  Financial Resource Strain: Low Risk  (09/05/2020)  Physical Activity: Inactive (09/05/2020)  Social Connections: Moderately Isolated (09/05/2020)  Stress: Stress Concern Present (09/05/2020)  Tobacco Use: Medium Risk (10/08/2022)   SDOH Interventions:     Readmission Risk Interventions     No data to display

## 2022-10-10 ENCOUNTER — Encounter (HOSPITAL_COMMUNITY): Payer: Self-pay | Admitting: Family Medicine

## 2022-10-10 DIAGNOSIS — G9341 Metabolic encephalopathy: Secondary | ICD-10-CM | POA: Diagnosis not present

## 2022-10-10 LAB — CBC WITH DIFFERENTIAL/PLATELET
Abs Immature Granulocytes: 0 10*3/uL (ref 0.00–0.07)
Basophils Absolute: 0 10*3/uL (ref 0.0–0.1)
Basophils Relative: 0 %
Eosinophils Absolute: 0 10*3/uL (ref 0.0–0.5)
Eosinophils Relative: 0 %
HCT: 33.2 % — ABNORMAL LOW (ref 36.0–46.0)
Hemoglobin: 10.9 g/dL — ABNORMAL LOW (ref 12.0–15.0)
Immature Granulocytes: 0 %
Lymphocytes Relative: 39 %
Lymphs Abs: 1.2 10*3/uL (ref 0.7–4.0)
MCH: 28.8 pg (ref 26.0–34.0)
MCHC: 32.8 g/dL (ref 30.0–36.0)
MCV: 87.6 fL (ref 80.0–100.0)
Monocytes Absolute: 0.3 10*3/uL (ref 0.1–1.0)
Monocytes Relative: 11 %
Neutro Abs: 1.5 10*3/uL — ABNORMAL LOW (ref 1.7–7.7)
Neutrophils Relative %: 50 %
Platelets: 127 10*3/uL — ABNORMAL LOW (ref 150–400)
RBC: 3.79 MIL/uL — ABNORMAL LOW (ref 3.87–5.11)
RDW: 13.2 % (ref 11.5–15.5)
WBC: 3 10*3/uL — ABNORMAL LOW (ref 4.0–10.5)
nRBC: 0 % (ref 0.0–0.2)

## 2022-10-10 LAB — COMPREHENSIVE METABOLIC PANEL
ALT: 19 U/L (ref 0–44)
AST: 36 U/L (ref 15–41)
Albumin: 3.1 g/dL — ABNORMAL LOW (ref 3.5–5.0)
Alkaline Phosphatase: 65 U/L (ref 38–126)
Anion gap: 9 (ref 5–15)
BUN: 14 mg/dL (ref 8–23)
CO2: 22 mmol/L (ref 22–32)
Calcium: 7.8 mg/dL — ABNORMAL LOW (ref 8.9–10.3)
Chloride: 102 mmol/L (ref 98–111)
Creatinine, Ser: 0.77 mg/dL (ref 0.44–1.00)
GFR, Estimated: >60 mL/min (ref >60)
Glucose, Bld: 89 mg/dL (ref 70–99)
Potassium: 3.3 mmol/L — ABNORMAL LOW (ref 3.5–5.1)
Sodium: 133 mmol/L — ABNORMAL LOW (ref 135–145)
Total Bilirubin: 0.4 mg/dL (ref 0.3–1.2)
Total Protein: 5.6 g/dL — ABNORMAL LOW (ref 6.5–8.1)

## 2022-10-10 LAB — C-REACTIVE PROTEIN: CRP: 1.7 mg/dL — ABNORMAL HIGH (ref <1.0)

## 2022-10-10 LAB — MAGNESIUM: Magnesium: 1.9 mg/dL (ref 1.7–2.4)

## 2022-10-10 LAB — FERRITIN: Ferritin: 77 ng/mL (ref 11–307)

## 2022-10-10 LAB — D-DIMER, QUANTITATIVE: D-Dimer, Quant: 0.54 ug/mL-FEU — ABNORMAL HIGH (ref 0.00–0.50)

## 2022-10-10 LAB — PHOSPHORUS: Phosphorus: 2.6 mg/dL (ref 2.5–4.6)

## 2022-10-10 MED ORDER — POTASSIUM CHLORIDE CRYS ER 20 MEQ PO TBCR
40.0000 meq | EXTENDED_RELEASE_TABLET | Freq: Once | ORAL | Status: AC
  Administered 2022-10-10: 40 meq via ORAL
  Filled 2022-10-10: qty 2

## 2022-10-10 MED ORDER — MEGESTROL ACETATE 400 MG/10ML PO SUSP
400.0000 mg | Freq: Every day | ORAL | Status: DC
  Administered 2022-10-10 – 2022-10-15 (×6): 400 mg via ORAL
  Filled 2022-10-10 (×6): qty 10

## 2022-10-10 NOTE — Plan of Care (Signed)

## 2022-10-10 NOTE — Progress Notes (Signed)
PROGRESS NOTE    Patient: [facility]                            PCP: Celene Squibb, MD                    DOB: 1929-05-14            DOA: 10/08/2022 [id number]             DOS: 10/10/2022, 2:04 PM   LOS: 1 day   Date of Service: The patient was seen and examined on 10/10/2022  Subjective:   The patient was seen and examined this morning, hard of hearing no acute distress Still complaining of cough, shortness of breath with minimal exertion  no issues overnight  Brief Narrative:   TOBIN CADIENTE is a 89 y.o. female with medical history significant for hypertension, depression and anxiety, hearing impairment. Patient was brought to the ED reports of weakness, nausea and confusion.  Patient is very hard of hearing, has history from patient is limited, daughter at bedside assist with history.  Patient lives alone without significant memory problems.  Patient's daughter reports that patient has been weak, with nausea and poor oral intake, headache over the past 2 days.  She is not aware of abdominal pain.  No diarrhea.  No vomiting. She has had a cough, but no difficulty breathing.  She is unaware of any falls. Today patient became confused, disoriented with confused speech.  Daughter was concerned for UTI as she has had similar presentation in the past.   ED Course: Tmax 98.4.  Pulse rate 70-92.  Respiratory rate 14-26.  Blood pressure systolic 272-536.  O2 sats 91-98 % on room air. COVID test positive. UA with trace leukocytes and positive nitrites.  Chest x-ray negative for acute abnormality.  CT abdomen and pelvis - without acute abnormality, suggest benign splenic hematoma. Urine cultures obtained IV ceftriaxone given.    Assessment & Plan:   Principal Problem:   Acute metabolic encephalopathy Active Problems:   COVID-19 virus infection   UTI (urinary tract infection)   Debility   DEPRESSION/ANXIETY   Essential hypertension   Gastroparesis     Assessment and  Plan: * Acute metabolic encephalopathy -Mentation has improved, patient is hard of hearing -Mentation back to baseline  POA: was Disoriented, confused speech.  At baseline no significant memory problems.   Lives alone. -  Baseline fecal incontinence.  No falls.  Very hard of hearing.  But answering questions appropriately,  - Encephalopathy in the setting of UTI and with infection. -Antibiotics, Paxlovid -Will continue N/s + 20 KCL 75cc/hr  -Resume venlafaxine and bupropion.  -Will continue venlafaxine -daughter reports patient goes to withdrawal if she does not take it  -Resume Ativan 1 mg at bedtime  UTI (urinary tract infection) Presenting with encephalopathy.  Ruled out for sepsis.   WBC 3.6.  Afebrile.  UA suggestive of UTI. -Urine culture positive for E. Coli  -IV ceftriaxone  COVID-19 virus infection -Not hypoxic.  Chest x-ray clear.   -Presenting with cough without dyspnea.   -Received at least 2 or 3 doses of COVID-vaccine. -Continue p.o. Paxlovid  Debility - Consulted PT/OT-recommending SNF -Patient and family agreeable -TOC consult, pursuing SNF placement  Gastroparesis -Tolerating p.o., improved nausea vomiting abdominal pain - POA: Presenting with nausea, reported LUQ pain.   - Poor oral intake.   - CT abdomen and pelvis negative  for acute abnormality, suggest benign splenic hematoma.  Essential hypertension Resume amlodipine, metoprolol  DEPRESSION/ANXIETY Resume venlafaxine, bupropion   -Urine culture >>> positive for E. coli pending sensitivity  ----------------------------------------------------------------------------------------------------------------------------------- Nutritional status:  The patient's BMI is: Body mass index is 22.81 kg/m. I agree with the assessment and plan as outlined -----------------------------------------------------------------------------------------------------------------------------------------  DVT prophylaxis:   enoxaparin (LOVENOX) injection 40 mg Start: 10/08/22 2000   Code Status:   Code Status: DNR  Family Communication: No family member present at bedside- attempt will be made to update daily The above findings and plan of care has been discussed with patient (and family)  in detail,  they expressed understanding and agreement of above. -Advance care planning has been discussed.   Disposition: From home To be discharged to SNF on Monday TOC following   Admission status:   Status is: Inpatient -Patient meets inpatient criteria due to severe debility, confusion.  Needing IV fluids, medication adjustments..    Procedures:   No admission procedures for hospital encounter.   Antimicrobials:  Anti-infectives (From admission, onward)    Start     Dose/Rate Route Frequency Ordered Stop   10/09/22 1500  cefTRIAXone (ROCEPHIN) 1 g in sodium chloride 0.9 % 100 mL IVPB        1 g 200 mL/hr over 30 Minutes Intravenous Every 24 hours 10/08/22 2223     10/09/22 0000  nirmatrelvir/ritonavir (renal dosing) (PAXLOVID) 2 tablet        2 tablet Oral 2 times daily 10/08/22 2301 10/13/22 2159   10/08/22 2200  nirmatrelvir/ritonavir (PAXLOVID) 3 tablet  Status:  Discontinued        3 tablet Oral 2 times daily 10/08/22 1901 10/08/22 2300   10/08/22 1530  cefTRIAXone (ROCEPHIN) 1 g in sodium chloride 0.9 % 100 mL IVPB        1 g 200 mL/hr over 30 Minutes Intravenous  Once 10/08/22 1525 10/08/22 1634        Medication:   acidophilus  2 capsule Oral TID   buPROPion  150 mg Oral q morning   enoxaparin (LOVENOX) injection  40 mg Subcutaneous Q24H   guaiFENesin-dextromethorphan  10 mL Oral Q8H   LORazepam  1 mg Oral QHS   metoprolol tartrate  50 mg Oral BID   nirmatrelvir/ritonavir (renal dosing)  2 tablet Oral BID   venlafaxine XR  225 mg Oral Daily    acetaminophen, dicyclomine, ondansetron **OR** ondansetron (ZOFRAN) IV   Objective:   Vitals:   10/09/22 2228 10/10/22 0638 10/10/22  0648 10/10/22 1300  BP: (!) 126/53 (!) 133/52 (!) 133/52 (!) 115/55  Pulse: (!) 50 63 63 64  Resp: '19 19 19 18  '$ Temp: 98 F (36.7 C) 98 F (36.7 C) 98 F (36.7 C) 98.8 F (37.1 C)  TempSrc:    Oral  SpO2: 95% 93% 93% 97%  Weight:      Height:        Intake/Output Summary (Last 24 hours) at 10/10/2022 1404 Last data filed at 10/09/2022 1500 Gross per 24 hour  Intake 914.67 ml  Output --  Net 914.67 ml   Filed Weights   10/08/22 1108  Weight: 68 kg     Examination:     General:  Hard of hearing AAO x 3,  cooperative, no distress;   HEENT:  Normocephalic, PERRL, otherwise with in Normal limits   Neuro:  CNII-XII intact. , normal motor and sensation, reflexes intact   Lungs:   Clear to auscultation BL, Respirations unlabored,  No wheezes / crackles  Cardio:    S1/S2, RRR, No murmure, No Rubs or Gallops   Abdomen:  Soft, non-tender, bowel sounds active all four quadrants, no guarding or peritoneal signs.  Muscular  skeletal:  Limited exam -sever global generalized weaknesses - in bed, able to move all 4 extremities,   2+ pulses,  symmetric, No pitting edema  Skin:  Dry, warm to touch, negative for any Rashes,  Wounds: Please see nursing documentation     --------------------------------------------------------------------------------------------------------------------------------    LABs:     Latest Ref Rng & Units 10/10/2022    4:39 AM 10/09/2022    4:47 AM 10/08/2022   11:44 AM  CBC  WBC 4.0 - 10.5 K/uL 3.0  3.8  3.6   Hemoglobin 12.0 - 15.0 g/dL 10.9  11.3  11.9   Hematocrit 36.0 - 46.0 % 33.2  34.4  36.3   Platelets 150 - 400 K/uL 127  132  148       Latest Ref Rng & Units 10/10/2022    4:39 AM 10/09/2022    4:47 AM 10/08/2022   11:44 AM  CMP  Glucose 70 - 99 mg/dL 89  80  98   BUN 8 - 23 mg/dL '14  15  13   '$ Creatinine 0.44 - 1.00 mg/dL 0.77  0.69  0.70   Sodium 135 - 145 mmol/L 133  132  135   Potassium 3.5 - 5.1 mmol/L 3.3  3.6  3.7   Chloride 98 - 111 mmol/L  102  101  102   CO2 22 - 32 mmol/L '22  23  24   '$ Calcium 8.9 - 10.3 mg/dL 7.8  7.9  8.3   Total Protein 6.5 - 8.1 g/dL 5.6  5.7  6.1   Total Bilirubin 0.3 - 1.2 mg/dL 0.4  0.7  0.7   Alkaline Phos 38 - 126 U/L 65  70  81   AST 15 - 41 U/L 36  31  32   ALT 0 - 44 U/L '19  17  18        '$ Micro Results Recent Results (from the past 240 hour(s))  Resp panel by RT-PCR (RSV, Flu A&B, Covid) Anterior Nasal Swab     Status: Abnormal   Collection Time: 10/08/22 11:24 AM   Specimen: Anterior Nasal Swab  Result Value Ref Range Status   SARS Coronavirus 2 by RT PCR POSITIVE (A) NEGATIVE Final    Comment: (NOTE) SARS-CoV-2 target nucleic acids are DETECTED.  The SARS-CoV-2 RNA is generally detectable in upper respiratory specimens during the acute phase of infection. Positive results are indicative of the presence of the identified virus, but do not rule out bacterial infection or co-infection with other pathogens not detected by the test. Clinical correlation with patient history and other diagnostic information is necessary to determine patient infection status. The expected result is Negative.  Fact Sheet for Patients: EntrepreneurPulse.com.au  Fact Sheet for Healthcare Providers: IncredibleEmployment.be  This test is not yet approved or cleared by the [place] FDA and  has been authorized for detection and/or diagnosis of SARS-CoV-2 by FDA under an Emergency Use Authorization (EUA).  This EUA will remain in effect (meaning this test can be used) for the duration of  the COVID-19 declaration under Section 564(b)(1) of the A ct, 21 U.S.C. section 360bbb-3(b)(1), unless the authorization is terminated or revoked sooner.     Influenza A by PCR NEGATIVE NEGATIVE Final   Influenza B by PCR NEGATIVE NEGATIVE  Final    Comment: (NOTE) The Xpert Xpress SARS-CoV-2/FLU/RSV plus assay is intended as an aid in the diagnosis of influenza from  Nasopharyngeal swab specimens and should not be used as a sole basis for treatment. Nasal washings and aspirates are unacceptable for Xpert Xpress SARS-CoV-2/FLU/RSV testing.  Fact Sheet for Patients: EntrepreneurPulse.com.au  Fact Sheet for Healthcare Providers: IncredibleEmployment.be  This test is not yet approved or cleared by the [place] FDA and has been authorized for detection and/or diagnosis of SARS-CoV-2 by FDA under an Emergency Use Authorization (EUA). This EUA will remain in effect (meaning this test can be used) for the duration of the COVID-19 declaration under Section 564(b)(1) of the Act, 21 U.S.C. section 360bbb-3(b)(1), unless the authorization is terminated or revoked.     Resp Syncytial Virus by PCR NEGATIVE NEGATIVE Final    Comment: (NOTE) Fact Sheet for Patients: EntrepreneurPulse.com.au  Fact Sheet for Healthcare Providers: IncredibleEmployment.be  This test is not yet approved or cleared by the [place] FDA and has been authorized for detection and/or diagnosis of SARS-CoV-2 by FDA under an Emergency Use Authorization (EUA). This EUA will remain in effect (meaning this test can be used) for the duration of the COVID-19 declaration under Section 564(b)(1) of the Act, 21 U.S.C. section 360bbb-3(b)(1), unless the authorization is terminated or revoked.  Performed at [facility], [address], [place], [place] [address]   Urine Culture     Status: Abnormal (Preliminary result)   Collection Time: 10/08/22  2:18 PM   Specimen: Urine, Clean Catch  Result Value Ref Range Status   Specimen Description   Final    URINE, CLEAN CATCH Performed at [facility], [address] [place], [place] [address]    Special Requests   Final    NONE Performed at [facility], [address] [place], [place] [address]    Culture (A)  Final    >=100,000 COLONIES/mL ESCHERICHIA  COLI SUSCEPTIBILITIES TO FOLLOW Performed at [facility] [address] [place], [place] [address]    Report Status PENDING  Incomplete    Radiology Reports No results found.  SIGNED: Deatra James, MD, FHM. [facility],  Pager (please use [email or web address] to page/text) Please use Epic Secure Chat for non-urgent communication (7AM-7PM)  If 7PM-7AM, please contact night-coverage [email or web address], 10/10/2022, 2:04 PM

## 2022-10-10 NOTE — Progress Notes (Signed)
Breakfast arrived and went into patient's room to wake and see if she wanted breakfast. Patient has an exremely hard time hearing so I wrote on paper for her "My name is [place], I am your nurse, you are in the hospital. Would you like your breakfast?" She then yelled "I'm not in no damn hospital get out of my room." Patient then went back to sleep. I was not able to give any of her meds, will reassess and attempt meds at a later time.

## 2022-10-11 DIAGNOSIS — G9341 Metabolic encephalopathy: Secondary | ICD-10-CM | POA: Diagnosis not present

## 2022-10-11 LAB — COMPREHENSIVE METABOLIC PANEL
ALT: 19 U/L (ref 0–44)
AST: 38 U/L (ref 15–41)
Albumin: 3 g/dL — ABNORMAL LOW (ref 3.5–5.0)
Alkaline Phosphatase: 67 U/L (ref 38–126)
Anion gap: 6 (ref 5–15)
BUN: 14 mg/dL (ref 8–23)
CO2: 25 mmol/L (ref 22–32)
Calcium: 7.9 mg/dL — ABNORMAL LOW (ref 8.9–10.3)
Chloride: 106 mmol/L (ref 98–111)
Creatinine, Ser: 0.77 mg/dL (ref 0.44–1.00)
GFR, Estimated: >60 mL/min (ref >60)
Glucose, Bld: 91 mg/dL (ref 70–99)
Potassium: 3.4 mmol/L — ABNORMAL LOW (ref 3.5–5.1)
Sodium: 137 mmol/L (ref 135–145)
Total Bilirubin: 0.4 mg/dL (ref 0.3–1.2)
Total Protein: 5.7 g/dL — ABNORMAL LOW (ref 6.5–8.1)

## 2022-10-11 LAB — C-REACTIVE PROTEIN: CRP: 2.1 mg/dL — ABNORMAL HIGH (ref <1.0)

## 2022-10-11 LAB — URINE CULTURE: Culture: >=100000 — AB

## 2022-10-11 LAB — CBC WITH DIFFERENTIAL/PLATELET
Abs Immature Granulocytes: 0 10*3/uL (ref 0.00–0.07)
Basophils Absolute: 0 10*3/uL (ref 0.0–0.1)
Basophils Relative: 0 %
Eosinophils Absolute: 0 10*3/uL (ref 0.0–0.5)
Eosinophils Relative: 0 %
HCT: 35.6 % — ABNORMAL LOW (ref 36.0–46.0)
Hemoglobin: 11.7 g/dL — ABNORMAL LOW (ref 12.0–15.0)
Immature Granulocytes: 0 %
Lymphocytes Relative: 49 %
Lymphs Abs: 1.8 10*3/uL (ref 0.7–4.0)
MCH: 29.2 pg (ref 26.0–34.0)
MCHC: 32.9 g/dL (ref 30.0–36.0)
MCV: 88.8 fL (ref 80.0–100.0)
Monocytes Absolute: 0.4 10*3/uL (ref 0.1–1.0)
Monocytes Relative: 10 %
Neutro Abs: 1.5 10*3/uL — ABNORMAL LOW (ref 1.7–7.7)
Neutrophils Relative %: 41 %
Platelets: 148 10*3/uL — ABNORMAL LOW (ref 150–400)
RBC: 4.01 MIL/uL (ref 3.87–5.11)
RDW: 13.2 % (ref 11.5–15.5)
WBC: 3.7 10*3/uL — ABNORMAL LOW (ref 4.0–10.5)
nRBC: 0 % (ref 0.0–0.2)

## 2022-10-11 MED ORDER — AMPHETAMINE-DEXTROAMPHETAMINE 10 MG PO TABS: 10.0000 mg | ORAL_TABLET | Freq: Every day | ORAL | Status: DC

## 2022-10-11 MED ORDER — TAMSULOSIN HCL 0.4 MG PO CAPS
0.4000 mg | ORAL_CAPSULE | Freq: Every day | ORAL | Status: DC
  Administered 2022-10-11 – 2022-10-15 (×5): 0.4 mg via ORAL
  Filled 2022-10-11 (×5): qty 1

## 2022-10-11 MED ORDER — AMPHETAMINE-DEXTROAMPHETAMINE 10 MG PO TABS
10.0000 mg | ORAL_TABLET | Freq: Every day | ORAL | Status: AC
  Administered 2022-10-11: 10 mg via ORAL
  Filled 2022-10-11: qty 1

## 2022-10-11 NOTE — Progress Notes (Signed)
PROGRESS NOTE    Patient: Whitney Galloway [facility]                            PCP: Celene Squibb, MD                    DOB: January 28, 1929            DOA: 10/08/2022 [id number]             DOS: 10/11/2022, 12:49 PM   LOS: 2 days   Date of Service: The patient was seen and examined on 10/11/2022  Subjective:   The patient was seen and examined this morning, stable in bed, hard of hearing More cooperative daughter had concern of patient sleeping most of the day Yesterday she asked if we had She gets severe sundowning Brief Narrative:   Whitney Galloway is a 89 y.o. female with medical history significant for hypertension, depression and anxiety, hearing impairment. Patient was brought to the ED reports of weakness, nausea and confusion.  Patient is very hard of hearing, has history from patient is limited, daughter at bedside assist with history.  Patient lives alone without significant memory problems.  Patient's daughter reports that patient has been weak, with nausea and poor oral intake, headache over the past 2 days.  She is not aware of abdominal pain.  No diarrhea.  No vomiting. She has had a cough, but no difficulty breathing.  She is unaware of any falls. Today patient became confused, disoriented with confused speech.  Daughter was concerned for UTI as she has had similar presentation in the past.   ED Course: Tmax 98.4.  Pulse rate 70-92.  Respiratory rate 14-26.  Blood pressure systolic 948-546.  O2 sats 91-98 % on room air. COVID test positive. UA with trace leukocytes and positive nitrites.  Chest x-ray negative for acute abnormality.  CT abdomen and pelvis - without acute abnormality, suggest benign splenic hematoma. Urine cultures obtained IV ceftriaxone given.    Assessment & Plan:   Principal Problem:   Acute metabolic encephalopathy Active Problems:   COVID-19 virus infection   UTI (urinary tract infection)   Debility   DEPRESSION/ANXIETY   Essential hypertension    Gastroparesis     Assessment and Plan: * Acute metabolic encephalopathy -Patient is somewhat improved -Denied patient experiencing sundowning, receiving home medication of Ativan for sleep  -Strictly during day patient is more sleepy Empiric -stimulant Adderall was added today Mentation progressing to improve  POA: was Disoriented, confused speech.  At baseline no significant memory problems.   Lives alone. -  Baseline fecal incontinence.  No falls.  Very hard of hearing.  But answering questions appropriately,  - Encephalopathy in the setting of UTI and with infection. -Antibiotics, Paxlovid -Will continue N/s + 20 KCL 75cc/hr  -Resume venlafaxine and bupropion.  -Will continue venlafaxine -daughter reports patient goes to withdrawal if she does not take it  -Resume Ativan 1 mg at bedtime  UTI (urinary tract infection) Presenting with encephalopathy.  Ruled out for sepsis.   WBC 3.6.  Afebrile.  UA suggestive of UTI. -Urine culture positive for E. Coli -pansensitive -IV ceftriaxone   COVID-19 virus infection -Persistent cough, without dyspnea, -Not hypoxic.  Chest x-ray clear.   -Received at least 2 or 3 doses of COVID-vaccine. -Continue p.o. Paxlovid  Debility - Consulted PT/OT-recommending SNF  -patient can be discharged this Monday, 10/14/2022 -Patient and family agreeable -TOC consult,  pursuing SNF placement  Gastroparesis -Tolerating p.o., improved nausea vomiting abdominal pain - POA: Presenting with nausea, reported LUQ pain.   - Poor oral intake.   - CT abdomen and pelvis negative for acute abnormality, suggest benign splenic hematoma.  Essential hypertension Resume amlodipine, metoprolol  DEPRESSION/ANXIETY Resume venlafaxine, bupropion   -Urine culture >>> positive for E. coli pending sensitivity  ----------------------------------------------------------------------------------------------------------------------------------- Nutritional status:   The patient's BMI is: Body mass index is 22.81 kg/m. I agree with the assessment and plan as outlined -----------------------------------------------------------------------------------------------------------------------------------------  DVT prophylaxis:  enoxaparin (LOVENOX) injection 40 mg Start: 10/08/22 2000   Code Status:   Code Status: DNR  Family Communication: No family member present at bedside- attempt will be made to update daily The above findings and plan of care has been discussed with patient (and family)  in detail,  they expressed understanding and agreement of above. -Advance care planning has been discussed.   Disposition: From home To be discharged to SNF on Monday TOC following   Admission status:   Status is: Inpatient -Patient meets inpatient criteria due to severe debility, confusion.  Needing IV fluids, medication adjustments..    Procedures:   No admission procedures for hospital encounter.   Antimicrobials:  Anti-infectives (From admission, onward)    Start     Dose/Rate Route Frequency Ordered Stop   10/09/22 1500  cefTRIAXone (ROCEPHIN) 1 g in sodium chloride 0.9 % 100 mL IVPB        1 g 200 mL/hr over 30 Minutes Intravenous Every 24 hours 10/08/22 2223     10/09/22 0000  nirmatrelvir/ritonavir (renal dosing) (PAXLOVID) 2 tablet        2 tablet Oral 2 times daily 10/08/22 2301 10/13/22 2159   10/08/22 2200  nirmatrelvir/ritonavir (PAXLOVID) 3 tablet  Status:  Discontinued        3 tablet Oral 2 times daily 10/08/22 1901 10/08/22 2300   10/08/22 1530  cefTRIAXone (ROCEPHIN) 1 g in sodium chloride 0.9 % 100 mL IVPB        1 g 200 mL/hr over 30 Minutes Intravenous  Once 10/08/22 1525 10/08/22 1634        Medication:   acidophilus  2 capsule Oral TID   buPROPion  150 mg Oral q morning   enoxaparin (LOVENOX) injection  40 mg Subcutaneous Q24H   guaiFENesin-dextromethorphan  10 mL Oral Q8H   LORazepam  1 mg Oral QHS   megestrol  400  mg Oral Daily   metoprolol tartrate  50 mg Oral BID   nirmatrelvir/ritonavir (renal dosing)  2 tablet Oral BID   venlafaxine XR  225 mg Oral Daily    acetaminophen, dicyclomine, ondansetron **OR** ondansetron (ZOFRAN) IV   Objective:   Vitals:   10/10/22 1951 10/10/22 2140 10/11/22 0552 10/11/22 1100  BP: (!) 110/56  123/62 (!) 107/53  Pulse: (!) 56 62 (!) 51 (!) 45  Resp: 18  16   Temp: 99.3 F (37.4 C)  98.5 F (36.9 C) 97.9 F (36.6 C)  TempSrc: Oral  Oral Oral  SpO2: 95%  95% 100%  Weight:      Height:        Intake/Output Summary (Last 24 hours) at 10/11/2022 1249 Last data filed at 10/11/2022 0900 Gross per 24 hour  Intake 120 ml  Output --  Net 120 ml   Filed Weights   10/08/22 1108  Weight: 68 kg     Examination:    General:  AAO x 3,  cooperative, no distress;  HEENT:  Normocephalic, PERRL, otherwise with in Normal limits   Neuro:  CNII-XII intact. , normal motor and sensation, reflexes intact   Lungs:   Clear to auscultation BL, Respirations unlabored,  No wheezes / crackles  Cardio:    S1/S2, RRR, No murmure, No Rubs or Gallops   Abdomen:  Soft, non-tender, bowel sounds active all four quadrants, no guarding or peritoneal signs.  Muscular  skeletal:  Limited exam - sever global generalized weaknesses - in bed, able to move all 4 extremities,   2+ pulses,  symmetric, No pitting edema  Skin:  Dry, warm to touch, negative for any Rashes,  Wounds: Please see nursing documentation         --------------------------------------------------------------------------------------------------------------------------------    LABs:     Latest Ref Rng & Units 10/11/2022    4:09 AM 10/10/2022    4:39 AM 10/09/2022    4:47 AM  CBC  WBC 4.0 - 10.5 K/uL 3.7  3.0  3.8   Hemoglobin 12.0 - 15.0 g/dL 11.7  10.9  11.3   Hematocrit 36.0 - 46.0 % 35.6  33.2  34.4   Platelets 150 - 400 K/uL 148  127  132       Latest Ref Rng & Units 10/11/2022    4:09 AM 10/10/2022     4:39 AM 10/09/2022    4:47 AM  CMP  Glucose 70 - 99 mg/dL 91  89  80   BUN 8 - 23 mg/dL '14  14  15   '$ Creatinine 0.44 - 1.00 mg/dL 0.77  0.77  0.69   Sodium 135 - 145 mmol/L 137  133  132   Potassium 3.5 - 5.1 mmol/L 3.4  3.3  3.6   Chloride 98 - 111 mmol/L 106  102  101   CO2 22 - 32 mmol/L '25  22  23   '$ Calcium 8.9 - 10.3 mg/dL 7.9  7.8  7.9   Total Protein 6.5 - 8.1 g/dL 5.7  5.6  5.7   Total Bilirubin 0.3 - 1.2 mg/dL 0.4  0.4  0.7   Alkaline Phos 38 - 126 U/L 67  65  70   AST 15 - 41 U/L 38  36  31   ALT 0 - 44 U/L '19  19  17        '$ Micro Results Recent Results (from the past 240 hour(s))  Resp panel by RT-PCR (RSV, Flu A&B, Covid) Anterior Nasal Swab     Status: Abnormal   Collection Time: 10/08/22 11:24 AM   Specimen: Anterior Nasal Swab  Result Value Ref Range Status   SARS Coronavirus 2 by RT PCR POSITIVE (A) NEGATIVE Final    Comment: (NOTE) SARS-CoV-2 target nucleic acids are DETECTED.  The SARS-CoV-2 RNA is generally detectable in upper respiratory specimens during the acute phase of infection. Positive results are indicative of the presence of the identified virus, but do not rule out bacterial infection or co-infection with other pathogens not detected by the test. Clinical correlation with patient history and other diagnostic information is necessary to determine patient infection status. The expected result is Negative.  Fact Sheet for Patients: EntrepreneurPulse.com.au  Fact Sheet for Healthcare Providers: IncredibleEmployment.be  This test is not yet approved or cleared by the [place] FDA and  has been authorized for detection and/or diagnosis of SARS-CoV-2 by FDA under an Emergency Use Authorization (EUA).  This EUA will remain in effect (meaning this test can be used) for the duration of  the COVID-19 declaration under Section 564(b)(1) of the A ct, 21 U.S.C. section 360bbb-3(b)(1), unless the authorization  is terminated or revoked sooner.     Influenza A by PCR NEGATIVE NEGATIVE Final   Influenza B by PCR NEGATIVE NEGATIVE Final    Comment: (NOTE) The Xpert Xpress SARS-CoV-2/FLU/RSV plus assay is intended as an aid in the diagnosis of influenza from Nasopharyngeal swab specimens and should not be used as a sole basis for treatment. Nasal washings and aspirates are unacceptable for Xpert Xpress SARS-CoV-2/FLU/RSV testing.  Fact Sheet for Patients: EntrepreneurPulse.com.au  Fact Sheet for Healthcare Providers: IncredibleEmployment.be  This test is not yet approved or cleared by the [place] FDA and has been authorized for detection and/or diagnosis of SARS-CoV-2 by FDA under an Emergency Use Authorization (EUA). This EUA will remain in effect (meaning this test can be used) for the duration of the COVID-19 declaration under Section 564(b)(1) of the Act, 21 U.S.C. section 360bbb-3(b)(1), unless the authorization is terminated or revoked.     Resp Syncytial Virus by PCR NEGATIVE NEGATIVE Final    Comment: (NOTE) Fact Sheet for Patients: EntrepreneurPulse.com.au  Fact Sheet for Healthcare Providers: IncredibleEmployment.be  This test is not yet approved or cleared by the [place] FDA and has been authorized for detection and/or diagnosis of SARS-CoV-2 by FDA under an Emergency Use Authorization (EUA). This EUA will remain in effect (meaning this test can be used) for the duration of the COVID-19 declaration under Section 564(b)(1) of the Act, 21 U.S.C. section 360bbb-3(b)(1), unless the authorization is terminated or revoked.  Performed at [facility], [address], [place], [place] [address]   Urine Culture     Status: Abnormal   Collection Time: 10/08/22  2:18 PM   Specimen: Urine, Clean Catch  Result Value Ref Range Status   Specimen Description URINE, CLEAN CATCH  Final   Special Requests  NONE  Final   Culture >=100,000 COLONIES/mL ESCHERICHIA COLI (A)  Final   Report Status 10/11/2022 FINAL  Final   Organism ID, Bacteria ESCHERICHIA COLI (A)  Final      Susceptibility   Escherichia coli - MIC*    AMPICILLIN <=2 SENSITIVE Sensitive     CEFAZOLIN <=4 SENSITIVE Sensitive     CEFEPIME <=0.12 SENSITIVE Sensitive     CEFTRIAXONE <=0.25 SENSITIVE Sensitive     CIPROFLOXACIN <=0.25 SENSITIVE Sensitive     GENTAMICIN <=1 SENSITIVE Sensitive     IMIPENEM <=0.25 SENSITIVE Sensitive     NITROFURANTOIN 32 SENSITIVE Sensitive     TRIMETH/SULFA <=20 SENSITIVE Sensitive     AMPICILLIN/SULBACTAM <=2 SENSITIVE Sensitive     PIP/TAZO <=4 SENSITIVE Sensitive     * >=100,000 COLONIES/mL ESCHERICHIA COLI    Radiology Reports No results found.  SIGNED: Deatra James, MD, FHM. [facility],  Pager (please use [email or web address] to page/text) Please use Epic Secure Chat for non-urgent communication (7AM-7PM)  If 7PM-7AM, please contact night-coverage [email or web address], 10/11/2022, 12:49 PM

## 2022-10-11 NOTE — Care Management Important Message (Signed)
Important Message  Patient Details  Name: Whitney Galloway MRN: [id number] Date of Birth: 08/26/1929   Medicare Important Message Given:  Yes (spoke with daughter Kelli Churn at [phone] to review letter, no additional copy needed)     Tommy Medal 10/11/2022, 2:18 PM

## 2022-10-11 NOTE — TOC Progression Note (Signed)
Transition of Care [facility]) - Progression Note    Patient Details  Name: Whitney Galloway MRN: [id number] Date of Birth: December 30, 1928  [facility] CM/SW Contact  Shade Flood, LCSW Phone Number: 10/11/2022, 9:32 AM  Clinical Narrative:     TOC following. Reviewed bed offers with pt's daughter, Jeannene Patella, who selects [facility]. She states she did discuss with pt who is reluctantly agreeable to short term rehab at dc.   Due to Covid quarantine requirements, earliest dc would be Monday 1/8. If pt is medically stable at that point, plan will be for dc to [facility] that day.  Will start insurance auth today. TOC will follow.  Expected Discharge Plan: Skilled Nursing Facility Barriers to Discharge: Other (must enter comment) (Covid quarantine period)  Expected Discharge Plan and Services In-house Referral: Clinical Social Work   Post Acute Care Choice: [place] Living arrangements for the past 2 months: [place] Determinants of Health (SDOH) Interventions [place]: No Food Insecurity (10/08/2022)  Housing: Low Risk  (10/08/2022)  Transportation Needs: No Transportation Needs (10/08/2022)  Utilities: Not At Risk (10/08/2022)  Alcohol Screen: Low Risk  (09/05/2020)  Depression (PHQ2-9): Medium Risk (09/05/2020)  Financial Resource Strain: Low Risk  (09/05/2020)  Physical Activity: Inactive (09/05/2020)  Social Connections: Moderately Isolated (09/05/2020)  Stress: Stress Concern Present (09/05/2020)  Tobacco Use: Medium Risk (10/10/2022)    Readmission Risk Interventions     No data to display

## 2022-10-11 NOTE — Progress Notes (Signed)
Per MD hold off on in and out until >500

## 2022-10-11 NOTE — Progress Notes (Signed)
Bladder scan showed >381. Will notify upcoming nurse for tonight.

## 2022-10-11 NOTE — Progress Notes (Signed)
Patient hasn't voided on this shift thus far. >375 cc on bladder scan. Notified MD.

## 2022-10-12 DIAGNOSIS — G9341 Metabolic encephalopathy: Secondary | ICD-10-CM | POA: Diagnosis not present

## 2022-10-12 LAB — CBC WITH DIFFERENTIAL/PLATELET
Abs Immature Granulocytes: 0.01 10*3/uL (ref 0.00–0.07)
Basophils Absolute: 0 10*3/uL (ref 0.0–0.1)
Basophils Relative: 0 %
Eosinophils Absolute: 0 10*3/uL (ref 0.0–0.5)
Eosinophils Relative: 1 %
HCT: 33.5 % — ABNORMAL LOW (ref 36.0–46.0)
Hemoglobin: 11.5 g/dL — ABNORMAL LOW (ref 12.0–15.0)
Immature Granulocytes: 0 %
Lymphocytes Relative: 56 %
Lymphs Abs: 1.9 10*3/uL (ref 0.7–4.0)
MCH: 29.6 pg (ref 26.0–34.0)
MCHC: 34.3 g/dL (ref 30.0–36.0)
MCV: 86.1 fL (ref 80.0–100.0)
Monocytes Absolute: 0.2 10*3/uL (ref 0.1–1.0)
Monocytes Relative: 7 %
Neutro Abs: 1.2 10*3/uL — ABNORMAL LOW (ref 1.7–7.7)
Neutrophils Relative %: 36 %
Platelets: 127 10*3/uL — ABNORMAL LOW (ref 150–400)
RBC: 3.89 MIL/uL (ref 3.87–5.11)
RDW: 13.2 % (ref 11.5–15.5)
WBC: 3.3 10*3/uL — ABNORMAL LOW (ref 4.0–10.5)
nRBC: 0.6 % — ABNORMAL HIGH (ref 0.0–0.2)

## 2022-10-12 LAB — COMPREHENSIVE METABOLIC PANEL
ALT: 16 U/L (ref 0–44)
AST: 32 U/L (ref 15–41)
Albumin: 3 g/dL — ABNORMAL LOW (ref 3.5–5.0)
Alkaline Phosphatase: 61 U/L (ref 38–126)
Anion gap: 9 (ref 5–15)
BUN: 13 mg/dL (ref 8–23)
CO2: 22 mmol/L (ref 22–32)
Calcium: 7.9 mg/dL — ABNORMAL LOW (ref 8.9–10.3)
Chloride: 104 mmol/L (ref 98–111)
Creatinine, Ser: 0.73 mg/dL (ref 0.44–1.00)
GFR, Estimated: >60 mL/min (ref >60)
Glucose, Bld: 88 mg/dL (ref 70–99)
Potassium: 3 mmol/L — ABNORMAL LOW (ref 3.5–5.1)
Sodium: 135 mmol/L (ref 135–145)
Total Bilirubin: 0.7 mg/dL (ref 0.3–1.2)
Total Protein: 5.7 g/dL — ABNORMAL LOW (ref 6.5–8.1)

## 2022-10-12 LAB — C-REACTIVE PROTEIN: CRP: 1.2 mg/dL — ABNORMAL HIGH (ref <1.0)

## 2022-10-12 MED ORDER — POTASSIUM CHLORIDE CRYS ER 20 MEQ PO TBCR
40.0000 meq | EXTENDED_RELEASE_TABLET | Freq: Once | ORAL | Status: AC
  Administered 2022-10-12: 40 meq via ORAL
  Filled 2022-10-12: qty 2

## 2022-10-12 MED ORDER — CIPROFLOXACIN HCL 250 MG PO TABS
500.0000 mg | ORAL_TABLET | Freq: Two times a day (BID) | ORAL | Status: DC
  Administered 2022-10-12 – 2022-10-15 (×7): 500 mg via ORAL
  Filled 2022-10-12 (×7): qty 2

## 2022-10-12 MED ORDER — AMPHETAMINE-DEXTROAMPHETAMINE 10 MG PO TABS
10.0000 mg | ORAL_TABLET | Freq: Every day | ORAL | Status: DC
  Administered 2022-10-13 – 2022-10-15 (×3): 10 mg via ORAL
  Filled 2022-10-12 (×3): qty 1

## 2022-10-12 MED ORDER — AMPHETAMINE-DEXTROAMPHETAMINE 10 MG PO TABS
10.0000 mg | ORAL_TABLET | Freq: Every day | ORAL | Status: AC
  Administered 2022-10-12: 10 mg via ORAL
  Filled 2022-10-12: qty 1

## 2022-10-12 NOTE — Progress Notes (Signed)
Night shift nurse stated in report that he had to do a I and O at 2 am this morning and retrieved 600 cc from bladder. Patient hasn't voided since then. Just did a bladder scan and it showed 310cc. Will notify MD.

## 2022-10-12 NOTE — Progress Notes (Signed)
Pt refused to ambulate in the room or get in the chair today. Attempted x2, and daughter tried to help as well and she stated " I do not want to get up" and went back to sleep.

## 2022-10-12 NOTE — Progress Notes (Signed)
398 cc on bladder scan.

## 2022-10-12 NOTE — Progress Notes (Signed)
PROGRESS NOTE    Patient: Whitney Galloway [facility]                            PCP: Celene Squibb, MD                    DOB: [birth year]            DOA: 10/08/2022 [id number]             DOS: 10/12/2022, 11:57 AM   LOS: 3 days   Date of Service: The patient was seen and examined on 10/12/2022  Subjective:   The patient was seen and examined this morning, sleepy, drowsy Hemodynamically stable She gets severe sundowning  Brief Narrative:   Whitney Galloway is a 89 y.o. female with medical history significant for hypertension, depression and anxiety, hearing impairment. Patient was brought to the ED reports of weakness, nausea and confusion.  Patient is very hard of hearing, has history from patient is limited, daughter at bedside assist with history.  Patient lives alone without significant memory problems.  Patient's daughter reports that patient has been weak, with nausea and poor oral intake, headache over the past 2 days.  She is not aware of abdominal pain.  No diarrhea.  No vomiting. She has had a cough, but no difficulty breathing.  She is unaware of any falls. Today patient became confused, disoriented with confused speech.  Daughter was concerned for UTI as she has had similar presentation in the past.   ED Course: Tmax 98.4.  Pulse rate 70-92.  Respiratory rate 14-26.  Blood pressure systolic 625-638.  O2 sats 91-98 % on room air. COVID test positive. UA with trace leukocytes and positive nitrites.  Chest x-ray negative for acute abnormality.  CT abdomen and pelvis - without acute abnormality, suggest benign splenic hematoma. Urine cultures obtained IV ceftriaxone given.    Assessment & Plan:   Principal Problem:   Acute metabolic encephalopathy Active Problems:   COVID-19 virus infection   UTI (urinary tract infection)   Debility   DEPRESSION/ANXIETY   Essential hypertension   Gastroparesis     Assessment and Plan: * Acute metabolic encephalopathy -Patient is somewhat  improved --experiencing sundowning, receiving home medication of Ativan for sleep  -Strictly during day patient is more sleepy Empiric -stimulant Adderall was given yesterday patient remained alert during the day   - mentation progressing to improve  POA: was Disoriented, confused speech.  At baseline no significant memory problems.   Lives alone. -  Baseline fecal incontinence.  No falls.  Very hard of hearing.  But answering questions appropriately,  - Encephalopathy in the setting of UTI and with infection. -Antibiotics, Paxlovid -Resume venlafaxine and bupropion.  -Will continue venlafaxine -daughter reports patient goes to withdrawal if she does not take it  -Resume Ativan 1 mg at bedtime  UTI (urinary tract infection) -Sepsis was ruled out WBC 3.6.  Afebrile.  UA suggestive of UTI. -Urine culture positive for E. Coli -pansensitive -IV ceftriaxone >> switch to p.o. ciprofloxacin  COVID-19 virus infection -Persistent cough, without dyspnea, -Not hypoxic.  Chest x-ray clear.   -Received at least 2 or 3 doses of COVID-vaccine. -Continue p.o. Paxlovid  Debility - Consulted PT/OT-recommending SNF  -patient can be discharged this Monday, 10/14/2022 -Patient and family agreeable -TOC consult, pursuing SNF placement  Gastroparesis -Tolerating p.o., improved nausea vomiting abdominal pain - POA: Presenting with nausea, reported LUQ pain.   -  Poor oral intake.   - CT abdomen and pelvis negative for acute abnormality, suggest benign splenic hematoma.  Essential hypertension Resume amlodipine, metoprolol  DEPRESSION/ANXIETY Resume venlafaxine, bupropion   -Urine culture >>> positive for E. coli pending sensitivity  ----------------------------------------------------------------------------------------------------------------------------------- Nutritional status:  The patient's BMI is: Body mass index is 22.81 kg/m. I agree with the assessment and plan as outlined  -----------------------------------------------------------------------------------------------------------------------------------------  DVT prophylaxis:  enoxaparin (LOVENOX) injection 40 mg Start: 10/08/22 2000   Code Status:   Code Status: DNR  Family Communication: No family member present at bedside- attempt will be made to update daily The above findings and plan of care has been discussed with patient (and family)  in detail,  they expressed understanding and agreement of above. -Advance care planning has been discussed.   Disposition: From home To be discharged to SNF on Monday TOC following   Admission status:   Status is: Inpatient -Patient meets inpatient criteria due to severe debility, confusion.  Needing IV fluids, medication adjustments..    Procedures:   No admission procedures for hospital encounter.   Antimicrobials:  Anti-infectives (From admission, onward)    Start     Dose/Rate Route Frequency Ordered Stop   10/12/22 0830  ciprofloxacin (CIPRO) tablet 500 mg        500 mg Oral 2 times daily 10/12/22 0730     10/09/22 1500  cefTRIAXone (ROCEPHIN) 1 g in sodium chloride 0.9 % 100 mL IVPB  Status:  Discontinued        1 g 200 mL/hr over 30 Minutes Intravenous Every 24 hours 10/08/22 2223 10/12/22 0730   10/09/22 0000  nirmatrelvir/ritonavir (renal dosing) (PAXLOVID) 2 tablet        2 tablet Oral 2 times daily 10/08/22 2301 10/13/22 2159   10/08/22 2200  nirmatrelvir/ritonavir (PAXLOVID) 3 tablet  Status:  Discontinued        3 tablet Oral 2 times daily 10/08/22 1901 10/08/22 2300   10/08/22 1530  cefTRIAXone (ROCEPHIN) 1 g in sodium chloride 0.9 % 100 mL IVPB        1 g 200 mL/hr over 30 Minutes Intravenous  Once 10/08/22 1525 10/08/22 1634        Medication:   acidophilus  2 capsule Oral TID   buPROPion  150 mg Oral q morning   ciprofloxacin  500 mg Oral BID   enoxaparin (LOVENOX) injection  40 mg Subcutaneous Q24H    guaiFENesin-dextromethorphan  10 mL Oral Q8H   LORazepam  1 mg Oral QHS   megestrol  400 mg Oral Daily   metoprolol tartrate  50 mg Oral BID   nirmatrelvir/ritonavir (renal dosing)  2 tablet Oral BID   tamsulosin  0.4 mg Oral Daily   venlafaxine XR  225 mg Oral Daily    acetaminophen, dicyclomine, ondansetron **OR** ondansetron (ZOFRAN) IV   Objective:   Vitals:   10/11/22 0552 10/11/22 1100 10/11/22 1951 10/12/22 0604  BP: 123/62 (!) 107/53 119/62 (!) 109/47  Pulse: (!) 51 (!) 45 (!) 57 71  Resp: '16  18 16  '$ Temp: 98.5 F (36.9 C) 97.9 F (36.6 C) 98.2 F (36.8 C) (!) 97.5 F (36.4 C)  TempSrc: Oral Oral Oral Oral  SpO2: 95% 100% 95% 98%  Weight:      Height:        Intake/Output Summary (Last 24 hours) at 10/12/2022 1157 Last data filed at 10/12/2022 0200 Gross per 24 hour  Intake 240 ml  Output 600 ml  Net -360 ml  Filed Weights   10/08/22 1108  Weight: 68 kg     Examination:     General:  AAO x 3,  cooperative, no distress;   HEENT:  Normocephalic, PERRL, otherwise with in Normal limits   Neuro:  CNII-XII intact. , normal motor and sensation, reflexes intact   Lungs:   Clear to auscultation BL, Respirations unlabored,  No wheezes / crackles  Cardio:    S1/S2, RRR, No murmure, No Rubs or Gallops   Abdomen:  Soft, non-tender, bowel sounds active all four quadrants, no guarding or peritoneal signs.  Muscular  skeletal:  Limited exam -sever global generalized weaknesses - in bed, able to move all 4 extremities,   2+ pulses,  symmetric, No pitting edema  Skin:  Dry, warm to touch, negative for any Rashes,  Wounds: Please see nursing documentation            --------------------------------------------------------------------------------------------------------------------------------    LABs:     Latest Ref Rng & Units 10/12/2022    7:50 AM 10/11/2022    4:09 AM 10/10/2022    4:39 AM  CBC  WBC 4.0 - 10.5 K/uL 3.3  3.7  3.0   Hemoglobin 12.0 - 15.0  g/dL 11.5  11.7  10.9   Hematocrit 36.0 - 46.0 % 33.5  35.6  33.2   Platelets 150 - 400 K/uL 127  148  127       Latest Ref Rng & Units 10/12/2022    7:50 AM 10/11/2022    4:09 AM 10/10/2022    4:39 AM  CMP  Glucose 70 - 99 mg/dL 88  91  89   BUN 8 - 23 mg/dL '13  14  14   '$ Creatinine 0.44 - 1.00 mg/dL 0.73  0.77  0.77   Sodium 135 - 145 mmol/L 135  137  133   Potassium 3.5 - 5.1 mmol/L 3.0  3.4  3.3   Chloride 98 - 111 mmol/L 104  106  102   CO2 22 - 32 mmol/L '22  25  22   '$ Calcium 8.9 - 10.3 mg/dL 7.9  7.9  7.8   Total Protein 6.5 - 8.1 g/dL 5.7  5.7  5.6   Total Bilirubin 0.3 - 1.2 mg/dL 0.7  0.4  0.4   Alkaline Phos 38 - 126 U/L 61  67  65   AST 15 - 41 U/L 32  38  36   ALT 0 - 44 U/L '16  19  19        '$ Micro Results   Radiology Reports No results found.  SIGNED: Deatra James, MD, FHM. [facility],  Pager (please use [email or web address] to page/text) Please use Epic Secure Chat for non-urgent communication (7AM-7PM)  If 7PM-7AM, please contact night-coverage [email or web address], 10/12/2022, 11:57 AM

## 2022-10-13 DIAGNOSIS — G9341 Metabolic encephalopathy: Secondary | ICD-10-CM | POA: Diagnosis not present

## 2022-10-13 LAB — CBC WITH DIFFERENTIAL/PLATELET
Abs Immature Granulocytes: 0.01 10*3/uL (ref 0.00–0.07)
Basophils Absolute: 0 10*3/uL (ref 0.0–0.1)
Basophils Relative: 1 %
Eosinophils Absolute: 0.1 10*3/uL (ref 0.0–0.5)
Eosinophils Relative: 1 %
HCT: 35.2 % — ABNORMAL LOW (ref 36.0–46.0)
Hemoglobin: 11.7 g/dL — ABNORMAL LOW (ref 12.0–15.0)
Immature Granulocytes: 0 %
Lymphocytes Relative: 36 %
Lymphs Abs: 1.6 10*3/uL (ref 0.7–4.0)
MCH: 28.9 pg (ref 26.0–34.0)
MCHC: 33.2 g/dL (ref 30.0–36.0)
MCV: 86.9 fL (ref 80.0–100.0)
Monocytes Absolute: 0.3 10*3/uL (ref 0.1–1.0)
Monocytes Relative: 7 %
Neutro Abs: 2.4 10*3/uL (ref 1.7–7.7)
Neutrophils Relative %: 55 %
Platelets: 136 10*3/uL — ABNORMAL LOW (ref 150–400)
RBC: 4.05 MIL/uL (ref 3.87–5.11)
RDW: 13.3 % (ref 11.5–15.5)
WBC: 4.3 10*3/uL (ref 4.0–10.5)
nRBC: 0 % (ref 0.0–0.2)

## 2022-10-13 LAB — COMPREHENSIVE METABOLIC PANEL
ALT: 16 U/L (ref 0–44)
AST: 29 U/L (ref 15–41)
Albumin: 2.9 g/dL — ABNORMAL LOW (ref 3.5–5.0)
Alkaline Phosphatase: 59 U/L (ref 38–126)
Anion gap: 11 (ref 5–15)
BUN: 11 mg/dL (ref 8–23)
CO2: 19 mmol/L — ABNORMAL LOW (ref 22–32)
Calcium: 8.1 mg/dL — ABNORMAL LOW (ref 8.9–10.3)
Chloride: 105 mmol/L (ref 98–111)
Creatinine, Ser: 0.7 mg/dL (ref 0.44–1.00)
GFR, Estimated: >60 mL/min (ref >60)
Glucose, Bld: 89 mg/dL (ref 70–99)
Potassium: 3.3 mmol/L — ABNORMAL LOW (ref 3.5–5.1)
Sodium: 135 mmol/L (ref 135–145)
Total Bilirubin: 0.7 mg/dL (ref 0.3–1.2)
Total Protein: 5.6 g/dL — ABNORMAL LOW (ref 6.5–8.1)

## 2022-10-13 LAB — C-REACTIVE PROTEIN: CRP: 0.8 mg/dL (ref <1.0)

## 2022-10-13 MED ORDER — POTASSIUM CHLORIDE CRYS ER 20 MEQ PO TBCR
40.0000 meq | EXTENDED_RELEASE_TABLET | Freq: Once | ORAL | Status: AC
  Administered 2022-10-13: 40 meq via ORAL
  Filled 2022-10-13: qty 2

## 2022-10-13 MED ORDER — METOPROLOL TARTRATE 25 MG PO TABS
25.0000 mg | ORAL_TABLET | Freq: Two times a day (BID) | ORAL | Status: DC
  Administered 2022-10-13 – 2022-10-15 (×4): 25 mg via ORAL
  Filled 2022-10-13 (×4): qty 1

## 2022-10-13 NOTE — Progress Notes (Signed)
PROGRESS NOTE    Patient: Whitney Galloway                            PCP: Celene Squibb, MD                    DOB: 1929-04-03            DOA: 10/08/2022 ZOX:[id number]             DOS: 10/13/2022, 10:49 AM   LOS: 4 days   Date of Service: The patient was seen and examined on 10/13/2022  Subjective:   The patient was seen and examined this morning, stable, awake not cooperative, refusing her morning medications No major issues overnight  Brief Narrative:   Whitney Galloway is a 89 y.o. female with medical history significant for hypertension, depression and anxiety, hearing impairment. Patient was brought to the ED reports of weakness, nausea and confusion.  Patient is very hard of hearing, has history from patient is limited, daughter at bedside assist with history.  Patient lives alone without significant memory problems.  Patient's daughter reports that patient has been weak, with nausea and poor oral intake, headache over the past 2 days.  She is not aware of abdominal pain.  No diarrhea.  No vomiting. She has had a cough, but no difficulty breathing.  She is unaware of any falls. Today patient became confused, disoriented with confused speech.  Daughter was concerned for UTI as she has had similar presentation in the past.   ED Course: Tmax 98.4.  Pulse rate 70-92.  Respiratory rate 14-26.  Blood pressure systolic 811-914.  O2 sats 91-98 % on room air. COVID test positive. UA with trace leukocytes and positive nitrites.  Chest x-ray negative for acute abnormality.  CT abdomen and pelvis - without acute abnormality, suggest benign splenic hematoma. Urine cultures obtained IV ceftriaxone given.    Assessment & Plan:   Principal Problem:   Acute metabolic encephalopathy Active Problems:   COVID-19 virus infection   UTI (urinary tract infection)   Debility   DEPRESSION/ANXIETY   Essential hypertension   Gastroparesis     Assessment and Plan: * Acute metabolic  encephalopathy - Improved at baseline --experiencing sundowning, receiving home medication of Ativan for sleep -Sleepy during daytime Empiric -stimulant Adderall was given yesterday patient remained alert during the day   - mentation progressing to improve  POA: was Disoriented, confused speech.  At baseline no significant memory problems.   Lives alone. -  Baseline fecal incontinence.  No falls.  Very hard of hearing.  But answering questions appropriately,  - Encephalopathy in the setting of UTI and with infection. -Antibiotics, Paxlovid -Resume venlafaxine and bupropion.  -Will continue venlafaxine -daughter reports patient goes to withdrawal if she does not take it  -Resume Ativan 1 mg at bedtime  UTI (urinary tract infection) -Sepsis was ruled out WBC 3.6.  Afebrile.  UA suggestive of UTI. -Urine culture positive for E. Coli -pansensitive -IV ceftriaxone >> switch to p.o. ciprofloxacin  COVID-19 virus infection -Improved coughing, and dyspnea  -Not hypoxic.  Chest x-ray clear.   -Received at least 2 or 3 doses of COVID-vaccine. -Continue p.o. Paxlovid  Debility - Consulted PT/OT-recommending SNF  -patient can be discharged this Monday, 10/14/2022 -Patient and family agreeable -TOC consult, pursuing SNF placement  Gastroparesis -Tolerating p.o., improved nausea vomiting abdominal pain - POA: Presenting with nausea, reported LUQ pain.   -  Poor oral intake.   - CT abdomen and pelvis negative for acute abnormality, suggest benign splenic hematoma.  Essential hypertension Resume metoprolol -Discontinued her amlodipine-blood pressure soft  DEPRESSION/ANXIETY Resume venlafaxine, bupropion   -Urine culture >>> positive for E. coli pending sensitivity  ----------------------------------------------------------------------------------------------------------------------------------- Nutritional status:  The patient's BMI is: Body mass index is 22.81 kg/m. I agree with  the assessment and plan as outlined -----------------------------------------------------------------------------------------------------------------------------------------  DVT prophylaxis:  enoxaparin (LOVENOX) injection 40 mg Start: 10/08/22 2000   Code Status:   Code Status: DNR  Family Communication: Daughter was updated at the bedside 10/12/2022 The above findings and plan of care has been discussed with patient (and family)  in detail,  they expressed understanding and agreement of above. -Advance care planning has been discussed.   Disposition: From home To be discharged to SNF on Monday TOC following   Admission status:   Status is: Inpatient -Patient meets inpatient criteria due to severe debility, confusion.  Needing IV fluids, medication adjustments..    Procedures:   No admission procedures for hospital encounter.   Antimicrobials:  Anti-infectives (From admission, onward)    Start     Dose/Rate Route Frequency Ordered Stop   10/12/22 0830  ciprofloxacin (CIPRO) tablet 500 mg        500 mg Oral 2 times daily 10/12/22 0730     10/09/22 1500  cefTRIAXone (ROCEPHIN) 1 g in sodium chloride 0.9 % 100 mL IVPB  Status:  Discontinued        1 g 200 mL/hr over 30 Minutes Intravenous Every 24 hours 10/08/22 2223 10/12/22 0730   10/09/22 0000  nirmatrelvir/ritonavir (renal dosing) (PAXLOVID) 2 tablet        2 tablet Oral 2 times daily 10/08/22 2301 10/13/22 2159   10/08/22 2200  nirmatrelvir/ritonavir (PAXLOVID) 3 tablet  Status:  Discontinued        3 tablet Oral 2 times daily 10/08/22 1901 10/08/22 2300   10/08/22 1530  cefTRIAXone (ROCEPHIN) 1 g in sodium chloride 0.9 % 100 mL IVPB        1 g 200 mL/hr over 30 Minutes Intravenous  Once 10/08/22 1525 10/08/22 1634        Medication:   acidophilus  2 capsule Oral TID   amphetamine-dextroamphetamine  10 mg Oral Q breakfast   buPROPion  150 mg Oral q morning   ciprofloxacin  500 mg Oral BID   enoxaparin  (LOVENOX) injection  40 mg Subcutaneous Q24H   guaiFENesin-dextromethorphan  10 mL Oral Q8H   LORazepam  1 mg Oral QHS   megestrol  400 mg Oral Daily   metoprolol tartrate  50 mg Oral BID   nirmatrelvir/ritonavir (renal dosing)  2 tablet Oral BID   potassium chloride  40 mEq Oral Once   tamsulosin  0.4 mg Oral Daily   venlafaxine XR  225 mg Oral Daily    acetaminophen, dicyclomine, ondansetron **OR** ondansetron (ZOFRAN) IV   Objective:   Vitals:   10/12/22 0604 10/12/22 1538 10/12/22 2030 10/13/22 0352  BP: (!) 109/47 (!) 107/54 (!) 125/53 (!) 108/52  Pulse: 71 80 81 86  Resp: 16 16 (!) 22 20  Temp: (!) 97.5 F (36.4 C) 98.3 F (36.8 C) 98.2 F (36.8 C) 98.8 F (37.1 C)  TempSrc: Oral Oral    SpO2: 98% 96% 96% 93%  Weight:      Height:        Intake/Output Summary (Last 24 hours) at 10/13/2022 1049 Last data filed at 10/13/2022 [phone]  Gross per 24 hour  Intake 600 ml  Output 550 ml  Net 50 ml   Filed Weights   10/08/22 1108  Weight: 68 kg     Examination:   General:  AAO x 3, agitated not cooperative -refusing her morning meds- no distress;   HEENT:  Normocephalic, PERRL, otherwise with in Normal limits   Neuro:  CNII-XII intact. , normal motor and sensation, reflexes intact   Lungs:   Clear to auscultation BL, Respirations unlabored,  No wheezes / crackles  Cardio:    S1/S2, RRR, No murmure, No Rubs or Gallops   Abdomen:  Soft, non-tender, bowel sounds active all four quadrants, no guarding or peritoneal signs.  Muscular  skeletal:  Limited exam -global generalized weaknesses - in bed, able to move all 4 extremities,   2+ pulses,  symmetric, No pitting edema  Skin:  Dry, warm to touch, negative for any Rashes,  Wounds: Please see nursing documentation         --------------------------------------------------------------------------------------------------------------------------------    LABs:     Latest Ref Rng & Units 10/13/2022    5:06 AM 10/12/2022     7:50 AM 10/11/2022    4:09 AM  CBC  WBC 4.0 - 10.5 K/uL 4.3  3.3  3.7   Hemoglobin 12.0 - 15.0 g/dL 11.7  11.5  11.7   Hematocrit 36.0 - 46.0 % 35.2  33.5  35.6   Platelets 150 - 400 K/uL 136  127  148       Latest Ref Rng & Units 10/13/2022    5:06 AM 10/12/2022    7:50 AM 10/11/2022    4:09 AM  CMP  Glucose 70 - 99 mg/dL 89  88  91   BUN 8 - 23 mg/dL '11  13  14   '$ Creatinine 0.44 - 1.00 mg/dL 0.70  0.73  0.77   Sodium 135 - 145 mmol/L 135  135  137   Potassium 3.5 - 5.1 mmol/L 3.3  3.0  3.4   Chloride 98 - 111 mmol/L 105  104  106   CO2 22 - 32 mmol/L '19  22  25   '$ Calcium 8.9 - 10.3 mg/dL 8.1  7.9  7.9   Total Protein 6.5 - 8.1 g/dL 5.6  5.7  5.7   Total Bilirubin 0.3 - 1.2 mg/dL 0.7  0.7  0.4   Alkaline Phos 38 - 126 U/L 59  61  67   AST 15 - 41 U/L 29  32  38   ALT 0 - 44 U/L '16  16  19        '$ Micro Results   Radiology Reports No results found.  SIGNED: Deatra James, MD, FHM. [facility],  Pager (please use [email or web address] to page/text) Please use Epic Secure Chat for non-urgent communication (7AM-7PM)  If 7PM-7AM, please contact night-coverage [email or web address], 10/13/2022, 10:49 AM

## 2022-10-13 NOTE — Progress Notes (Signed)
Patient did take her meds when her daughter arrived she did allow me to bladder scan her, but she refused mobility.

## 2022-10-13 NOTE — Plan of Care (Signed)
  Problem: Education: Goal: Knowledge of General Education information will improve Description Including pain rating scale, medication(s)/side effects and non-pharmacologic comfort measures Outcome: Progressing   Problem: Health Behavior/Discharge Planning: Goal: Ability to manage health-related needs will improve Outcome: Progressing   

## 2022-10-13 NOTE — Progress Notes (Signed)
Patient's daughter arrived and she was able to encourage patient to take meds.

## 2022-10-13 NOTE — Progress Notes (Signed)
Patient is refusing all morning medicine. She stated that she has taken too many medications and they make her sick. Will try again later.

## 2022-10-14 DIAGNOSIS — G9341 Metabolic encephalopathy: Secondary | ICD-10-CM | POA: Diagnosis not present

## 2022-10-14 MED ORDER — LORAZEPAM 1 MG PO TABS: 1.0000 mg | ORAL_TABLET | Freq: Every day | ORAL | 0 refills | Status: DC

## 2022-10-14 MED ORDER — MEGESTROL ACETATE 400 MG/10ML PO SUSP: 400.0000 mg | Freq: Every day | ORAL | 0 refills | Status: DC

## 2022-10-14 NOTE — TOC Progression Note (Signed)
Transition of Care [facility]) - Progression Note    Patient Details  Name: Whitney Galloway MRN: [id number] Date of Birth: Jul 08, 1929  [facility] CM/SW Contact  Salome Arnt, [place] Phone Number: 10/14/2022, 2:53 PM  Clinical Narrative:   Josem Kaufmann still pending. Will d/c to [facility] when auth received. TOC will follow.     Expected Discharge Plan: Skilled Nursing Facility Barriers to Discharge: Other (must enter comment) (Covid quarantine period)  Expected Discharge Plan and Services In-house Referral: Clinical Social Work   Post Acute Care Choice: [place] Living arrangements for the past 2 months: Single Family Home Expected Discharge Date: 10/14/22                                     Social Determinants of Health ([place]) Interventions [place]: No Food Insecurity (10/08/2022)  Housing: Low Risk  (10/08/2022)  Transportation Needs: No Transportation Needs (10/08/2022)  Utilities: Not At Risk (10/08/2022)  Alcohol Screen: Low Risk  (09/05/2020)  Depression (PHQ2-9): Medium Risk (09/05/2020)  Financial Resource Strain: Low Risk  (09/05/2020)  Physical Activity: Inactive (09/05/2020)  Social Connections: Moderately Isolated (09/05/2020)  Stress: Stress Concern Present (09/05/2020)  Tobacco Use: Medium Risk (10/10/2022)    Readmission Risk Interventions     No data to display

## 2022-10-14 NOTE — Discharge Summary (Signed)
Physician Discharge Summary   Patient: Whitney Galloway MRN: [id number] DOB: 1929/04/18  Admit date:     10/08/2022  Discharge date: 10/14/22  Discharge Physician: Deatra James   PCP: Celene Squibb, MD   Recommendations at discharge:   Follow-up with PCP within 2-4-week Continue current medications after the change by PCP Continue ET OT, fall precautions  Discharge Diagnoses: Principal Problem:   Acute metabolic encephalopathy Active Problems:   COVID-19 virus infection   UTI (urinary tract infection)   Debility   DEPRESSION/ANXIETY   Essential hypertension   Gastroparesis  Resolved Problems:   * No resolved hospital problems. [facility] Course: Whitney Galloway is a 89 y.o. female with medical history significant for hypertension, depression and anxiety, hearing impairment. Patient was brought to the ED reports of weakness, nausea and confusion.  Patient is very hard of hearing, has history from patient is limited, daughter at bedside assist with history.  Patient lives alone without significant memory problems.  Patient's daughter reports that patient has been weak, with nausea and poor oral intake, headache over the past 2 days.  She is not aware of abdominal pain.  No diarrhea.  No vomiting. She has had a cough, but no difficulty breathing.  She is unaware of any falls. Today patient became confused, disoriented with confused speech.  Daughter was concerned for UTI as she has had similar presentation in the past.   ED Course: Tmax 98.4.  Pulse rate 70-92.  Respiratory rate 14-26.  Blood pressure systolic 035-597.  O2 sats 91-98 % on room air. COVID test positive. UA with trace leukocytes and positive nitrites.  Chest x-ray negative for acute abnormality.  CT abdomen and pelvis - without acute abnormality, suggest benign splenic hematoma. Urine cultures obtained IV ceftriaxone given.  Assessment and Plan: * Acute metabolic encephalopathy -Resolved --experiencing  sundowning, receiving home medication of Ativan for sleep -Continue increased daytime somnolence, sleepiness, consider adding a stimulant such as Adderall  POA: was Disoriented, confused speech.  At baseline no significant memory problems.   Lives alone. -  Baseline fecal incontinence.  No falls.  Very hard of hearing.  But answering questions appropriately,  - Encephalopathy in the setting of UTI and with infection. -Antibiotics, Paxlovid -Resume venlafaxine and bupropion.  -Will continue venlafaxine -daughter reports patient goes to withdrawal if she does not take it  -Resume Ativan 1 mg at bedtime  UTI (urinary tract infection) -Sepsis was ruled out WBC 3.6.  Afebrile.  UA suggestive of UTI. -Urine culture positive for E. Coli -pansensitive -IV ceftriaxone >> switch to p.o. ciprofloxacin--completed course of antibiotics antibiotics 10/14/2022  COVID-19 virus infection -Improved coughing, and dyspnea  -Not hypoxic.  Chest x-ray clear.   -Received at least 2 or 3 doses of COVID-vaccine. -p.o. Paxlovid--pleated 5 days treatment  Debility - Consulted PT/OT-recommending SNF  -patient can be discharged this Monday, 10/14/2022 -Patient and family agreeable -TOC consult, pursuing SNF placement  Gastroparesis -Tolerating p.o., improved nausea vomiting abdominal pain - POA: Presenting with nausea, reported LUQ pain.   - Poor oral intake.   - CT abdomen and pelvis negative for acute abnormality, suggest benign splenic hematoma. -Appetite stimulant Megace added-p.o. intake has improved  Essential hypertension Resume metoprolol -Discontinued her amlodipine-blood pressure soft  DEPRESSION/ANXIETY Resume venlafaxine, bupropion     Disposition: Skilled nursing facility Diet recommendation:  Discharge Diet Orders (From admission, onward)     Start     Ordered   10/14/22 0000  Diet - low  sodium heart healthy        10/14/22 0715           Regular diet DISCHARGE  MEDICATION: Allergies as of 10/14/2022   No Known Allergies      Medication List     STOP taking these medications    acetaminophen-codeine 300-30 MG tablet Commonly known as: TYLENOL #3   amlodipine-atorvastatin 10-20 MG tablet Commonly known as: CADUET   famotidine 20 MG tablet Commonly known as: PEPCID       TAKE these medications    acetaminophen 650 MG CR tablet Commonly known as: TYLENOL Take 650 mg by mouth every 8 (eight) hours as needed for pain.   b complex vitamins capsule Take 1 capsule by mouth daily.   benzonatate 100 MG capsule Commonly known as: TESSALON Take 1 capsule (100 mg total) by mouth every 8 (eight) hours.   buPROPion 150 MG 24 hr tablet Commonly known as: WELLBUTRIN XL Take 1 tablet by mouth every morning.   dicyclomine 10 MG capsule Commonly known as: BENTYL Take 1 capsule (10 mg total) by mouth daily as needed. What changed: reasons to take this   LORazepam 1 MG tablet Commonly known as: ATIVAN Take 1 tablet (1 mg total) by mouth at bedtime for 20 days.   megestrol 400 MG/10ML suspension Commonly known as: MEGACE Take 10 mLs (400 mg total) by mouth daily.   metoprolol tartrate 25 MG tablet Commonly known as: LOPRESSOR Take 25 mg by mouth 2 (two) times daily.   multivitamin with minerals Tabs tablet Take 1 tablet by mouth daily.   pantoprazole 40 MG tablet Commonly known as: PROTONIX Take 40 mg by mouth daily.   PRESERVISION AREDS PO Take by mouth daily.   THERATEARS OP Apply to eye 2 (two) times daily.   Venlafaxine HCl 225 MG Tb24 Take 225 mg by mouth daily.        Contact information for after-discharge care     [facility] .   Service: Skilled Nursing Contact information: [address] [place] [phone]                    Discharge Exam: Danley Danker Weights   10/08/22 1108  Weight: 68 kg        General:  Hard of  hearing AAO x 3,  cooperative, no distress;   HEENT:  Normocephalic, PERRL, otherwise with in Normal limits   Neuro:  CNII-XII intact. , normal motor and sensation, reflexes intact   Lungs:   Clear to auscultation BL, Respirations unlabored,  No wheezes / crackles  Cardio:    S1/S2, RRR, No murmure, No Rubs or Gallops   Abdomen:  Soft, non-tender, bowel sounds active all four quadrants, no guarding or peritoneal signs.  Muscular  skeletal:  Limited exam -global generalized weaknesses - in bed, able to move all 4 extremities,   2+ pulses,  symmetric, No pitting edema  Skin:  Dry, warm to touch, negative for any Rashes,  Wounds: Please see nursing documentation          Condition at discharge: good  The results of significant diagnostics from this hospitalization (including imaging, microbiology, ancillary and laboratory) are listed below for reference.   Imaging Studies: CT ABDOMEN PELVIS W CONTRAST  Result Date: 10/08/2022 CLINICAL DATA:  Abdominal pain EXAM: CT ABDOMEN AND PELVIS WITH CONTRAST TECHNIQUE: Multidetector CT imaging of the abdomen and pelvis was performed  using the standard protocol following bolus administration of intravenous contrast. RADIATION DOSE REDUCTION: This exam was performed according to the departmental dose-optimization program which includes automated exposure control, adjustment of the mA and/or kV according to patient size and/or use of iterative reconstruction technique. CONTRAST:  154m OMNIPAQUE IOHEXOL 300 MG/ML  SOLN COMPARISON:  04/04/2020 FINDINGS: Lower chest: No pleural fluid or airspace disease. Hepatobiliary: No focal liver abnormality. No gallstones, gallbladder wall thickening or pericholecystic fluid. No significant bile duct dilatation. Pancreas: Unremarkable. No pancreatic ductal dilatation or surrounding inflammatory changes. Spleen: Again seen is a mass arising off the anterior aspect of the spleen which measures 7.9 x 6.3 cm, image 25/2.  Unchanged when compared with 04/04/2020. Adrenals/Urinary Tract: Normal adrenal glands. No nephrolithiasis or hydronephrosis. Urinary bladder is unremarkable. Stomach/Bowel: Small hiatal hernia. The appendix is visualized and appears within normal limits. No pathologic dilatation of the large or small bowel loops. The sigmoid diverticulosis without evidence for acute diverticulitis. Vascular/Lymphatic: Aortic atherosclerosis. No aneurysm. No abdominopelvic adenopathy identified. Reproductive: Uterus and bilateral adnexa are unremarkable. Other: No free fluid or fluid collections. Musculoskeletal: No acute or suspicious osseous findings. Posterior hardware fixation and interbody fusion is been performed at the L3-4 level. Marked degenerative disc disease noted at L2-3 L4-5 and L5-S1. IMPRESSION: 1. No acute findings within the abdomen or pelvis. 2. Sigmoid diverticulosis without evidence for acute diverticulitis. 3. Stable appearance of mass arising off the anterior aspect of the spleen. Previously characterized as likely representing a benign splenic hamartoma. 4. Small hiatal hernia. 5.  Aortic Atherosclerosis (ICD10-I70.0). Electronically Signed   By: TKerby MoorsM.D.   On: 10/08/2022 14:10   DG Chest 2 View  Result Date: 10/08/2022 CLINICAL DATA:  Cough. EXAM: CHEST - 2 VIEW COMPARISON:  July 25, 2022. FINDINGS: Stable cardiomediastinal silhouette. Lung is clear. Mild right upper lobe reticular densities are noted most consistent with scarring or subsegmental atelectasis. Bony thorax is unremarkable. IMPRESSION: Mild right upper lobe scarring or subsegmental atelectasis. Electronically Signed   By: JMarijo ConceptionM.D.   On: 10/08/2022 13:34    Microbiology: Results for orders placed or performed during the hospital encounter of 10/08/22  Resp panel by RT-PCR (RSV, Flu A&B, Covid) Anterior Nasal Swab     Status: Abnormal   Collection Time: 10/08/22 11:24 AM   Specimen: Anterior Nasal Swab  Result  Value Ref Range Status   SARS Coronavirus 2 by RT PCR POSITIVE (A) NEGATIVE Final    Comment: (NOTE) SARS-CoV-2 target nucleic acids are DETECTED.  The SARS-CoV-2 RNA is generally detectable in upper respiratory specimens during the acute phase of infection. Positive results are indicative of the presence of the identified virus, but do not rule out bacterial infection or co-infection with other pathogens not detected by the test. Clinical correlation with patient history and other diagnostic information is necessary to determine patient infection status. The expected result is Negative.  Fact Sheet for Patients: hEntrepreneurPulse.com.au Fact Sheet for Healthcare Providers: hIncredibleEmployment.be This test is not yet approved or cleared by the [place] and  has been authorized for detection and/or diagnosis of SARS-CoV-2 by FDA under an Emergency Use Authorization (EUA).  This EUA will remain in effect (meaning this test can be used) for the duration of  the COVID-19 declaration under Section 564(b)(1) of the A ct, 21 U.S.C. section 360bbb-3(b)(1), unless the authorization is terminated or revoked sooner.     Influenza A by PCR NEGATIVE NEGATIVE Final   Influenza B by  PCR NEGATIVE NEGATIVE Final    Comment: (NOTE) The Xpert Xpress SARS-CoV-2/FLU/RSV plus assay is intended as an aid in the diagnosis of influenza from Nasopharyngeal swab specimens and should not be used as a sole basis for treatment. Nasal washings and aspirates are unacceptable for Xpert Xpress SARS-CoV-2/FLU/RSV testing.  Fact Sheet for Patients: EntrepreneurPulse.com.au  Fact Sheet for Healthcare Providers: IncredibleEmployment.be  This test is not yet approved or cleared by the [place] FDA and has been authorized for detection and/or diagnosis of SARS-CoV-2 by FDA under an Emergency Use Authorization (EUA). This EUA  will remain in effect (meaning this test can be used) for the duration of the COVID-19 declaration under Section 564(b)(1) of the Act, 21 U.S.C. section 360bbb-3(b)(1), unless the authorization is terminated or revoked.     Resp Syncytial Virus by PCR NEGATIVE NEGATIVE Final    Comment: (NOTE) Fact Sheet for Patients: EntrepreneurPulse.com.au  Fact Sheet for Healthcare Providers: IncredibleEmployment.be  This test is not yet approved or cleared by the [place] FDA and has been authorized for detection and/or diagnosis of SARS-CoV-2 by FDA under an Emergency Use Authorization (EUA). This EUA will remain in effect (meaning this test can be used) for the duration of the COVID-19 declaration under Section 564(b)(1) of the Act, 21 U.S.C. section 360bbb-3(b)(1), unless the authorization is terminated or revoked.  Performed at [facility], [address], [place], [place] [address]   Urine Culture     Status: Abnormal   Collection Time: 10/08/22  2:18 PM   Specimen: Urine, Clean Catch  Result Value Ref Range Status   Specimen Description URINE, CLEAN CATCH  Final   Special Requests NONE  Final   Culture >=100,000 COLONIES/mL ESCHERICHIA COLI (A)  Final   Report Status 10/11/2022 FINAL  Final   Organism ID, Bacteria ESCHERICHIA COLI (A)  Final      Susceptibility   Escherichia coli - MIC*    AMPICILLIN <=2 SENSITIVE Sensitive     CEFAZOLIN <=4 SENSITIVE Sensitive     CEFEPIME <=0.12 SENSITIVE Sensitive     CEFTRIAXONE <=0.25 SENSITIVE Sensitive     CIPROFLOXACIN <=0.25 SENSITIVE Sensitive     GENTAMICIN <=1 SENSITIVE Sensitive     IMIPENEM <=0.25 SENSITIVE Sensitive     NITROFURANTOIN 32 SENSITIVE Sensitive     TRIMETH/SULFA <=20 SENSITIVE Sensitive     AMPICILLIN/SULBACTAM <=2 SENSITIVE Sensitive     PIP/TAZO <=4 SENSITIVE Sensitive     * >=100,000 COLONIES/mL ESCHERICHIA COLI    Labs: CBC: Recent Labs  Lab 10/09/22 0447  10/10/22 0439 10/11/22 0409 10/12/22 0750 10/13/22 0506  WBC 3.8* 3.0* 3.7* 3.3* 4.3  NEUTROABS 2.3 1.5* 1.5* 1.2* 2.4  HGB 11.3* 10.9* 11.7* 11.5* 11.7*  HCT 34.4* 33.2* 35.6* 33.5* 35.2*  MCV 88.0 87.6 88.8 86.1 86.9  PLT 132* 127* 148* 127* 245*   Basic Metabolic Panel: Recent Labs  Lab 10/09/22 0447 10/10/22 0439 10/11/22 0409 10/12/22 0750 10/13/22 0506  NA 132* 133* 137 135 135  K 3.6 3.3* 3.4* 3.0* 3.3*  CL 101 102 106 104 105  CO2 '23 22 25 22 '$ 19*  GLUCOSE 80 89 91 88 89  BUN '15 14 14 13 11  '$ CREATININE 0.69 0.77 0.77 0.73 0.70  CALCIUM 7.9* 7.8* 7.9* 7.9* 8.1*  MG 1.9 1.9  --   --   --   PHOS 3.1 2.6  --   --   --    Liver Function Tests: Recent Labs  Lab 10/09/22 0447 10/10/22 0439  10/11/22 0409 10/12/22 0750 10/13/22 0506  AST 31 36 38 32 29  ALT '17 19 19 16 16  '$ ALKPHOS 70 65 67 61 59  BILITOT 0.7 0.4 0.4 0.7 0.7  PROT 5.7* 5.6* 5.7* 5.7* 5.6*  ALBUMIN 3.1* 3.1* 3.0* 3.0* 2.9*   CBG: Recent Labs  Lab 10/08/22 1404 10/08/22 1838  GLUCAP 71 84    Discharge time spent: greater than 40 minutes.  Signed: Deatra James, MD [facility] 10/14/2022

## 2022-10-14 NOTE — Care Management Important Message (Signed)
Important Message  Patient Details  Name: Whitney Galloway MRN: [id number] Date of Birth: 1929/04/01   Medicare Important Message Given:  Other (see comment) (spoke with daughter Kelli Churn 10/11/21, no additonal copy needed)     Tommy Medal 10/14/2022, 1:13 PM

## 2022-10-15 ENCOUNTER — Ambulatory Visit: Payer: Medicare Other | Admitting: Internal Medicine

## 2022-10-15 ENCOUNTER — Inpatient Hospital Stay (HOSPITAL_COMMUNITY): Payer: Medicare Other

## 2022-10-15 DIAGNOSIS — G9341 Metabolic encephalopathy: Secondary | ICD-10-CM | POA: Diagnosis not present

## 2022-10-15 MED ORDER — POTASSIUM CHLORIDE CRYS ER 20 MEQ PO TBCR
40.0000 meq | EXTENDED_RELEASE_TABLET | Freq: Once | ORAL | Status: AC
  Administered 2022-10-15: 40 meq via ORAL
  Filled 2022-10-15: qty 2

## 2022-10-15 MED ORDER — LORAZEPAM 1 MG PO TABS: 1.0000 mg | ORAL_TABLET | Freq: Every day | ORAL | 0 refills | Status: DC

## 2022-10-15 NOTE — TOC Transition Note (Signed)
Transition of Care [facility]) - CM/SW Discharge Note   Patient Details  Name: Whitney Galloway MRN: [id number] Date of Birth: 1929-06-27  [facility]) CM/SW Contact:  Shade Flood, LCSW Phone Number: 10/15/2022, 2:17 PM   Clinical Narrative:     Pt remains stable for dc today per MD. Marilynn Rail obtained for SNF rehab. Updated Kerri at [facility]. They can accept today.   Updated daughter. RN to call report. DC clinical sent electronically.   There are no other TOC needs for dc.  Final next level of care: Skilled Nursing Facility Barriers to Discharge: Barriers Resolved   Patient Goals and CMS Choice CMS Medicare.gov Compare Post Acute Care list provided to:: Patient Represenative (must comment) Choice offered to / list presented to : Adult Children  Discharge Placement                Patient chooses bed at: [facility] Patient to be transferred to facility by: w/c Name of family member notified: Pam Patient and family notified of of transfer: 10/15/22  Discharge Plan and Services Additional resources added to the After Visit Summary for   In-house Referral: Clinical Social Work   Post Acute Care Choice: [facility]                               Social Determinants of Health ([place]) Interventions [place]: No Food Insecurity (10/08/2022)  Housing: Low Risk  (10/08/2022)  Transportation Needs: No Transportation Needs (10/08/2022)  Utilities: Not At Risk (10/08/2022)  Alcohol Screen: Low Risk  (09/05/2020)  Depression (PHQ2-9): Medium Risk (09/05/2020)  Financial Resource Strain: Low Risk  (09/05/2020)  Physical Activity: Inactive (09/05/2020)  Social Connections: Moderately Isolated (09/05/2020)  Stress: Stress Concern Present (09/05/2020)  Tobacco Use: Medium Risk (10/10/2022)     Readmission Risk Interventions     No data to display

## 2022-10-15 NOTE — Discharge Summary (Signed)
Physician Discharge Summary   Patient: Whitney Galloway MRN: [id number] DOB: 02-26-1929  Admit date:     10/08/2022  Discharge date: 10/15/22  Discharge Physician: Deatra James   PCP: Celene Squibb, MD   The patient was seen and examined this morning, remained stable for discharge -no further changes in this discharge summary   Recommendations at discharge:   Follow-up with PCP within 2-4-week Continue current medications after the change by PCP Continue PT OT, fall precautions  Discharge Diagnoses: Principal Problem:   Acute metabolic encephalopathy Active Problems:   COVID-19 virus infection   UTI (urinary tract infection)   Debility   DEPRESSION/ANXIETY   Essential hypertension   Gastroparesis  Resolved Problems:   * No resolved hospital problems. [facility] Course: Whitney Galloway is a 89 y.o. female with medical history significant for hypertension, depression and anxiety, hearing impairment. Patient was brought to the ED reports of weakness, nausea and confusion.  Patient is very hard of hearing, has history from patient is limited, daughter at bedside assist with history.  Patient lives alone without significant memory problems.  Patient's daughter reports that patient has been weak, with nausea and poor oral intake, headache over the past 2 days.  She is not aware of abdominal pain.  No diarrhea.  No vomiting. She has had a cough, but no difficulty breathing.  She is unaware of any falls. Today patient became confused, disoriented with confused speech.  Daughter was concerned for UTI as she has had similar presentation in the past.   ED Course: Tmax 98.4.  Pulse rate 70-92.  Respiratory rate 14-26.  Blood pressure systolic 491-791.  O2 sats 91-98 % on room air. COVID test positive. UA with trace leukocytes and positive nitrites.  Chest x-ray negative for acute abnormality.  CT abdomen and pelvis - without acute abnormality, suggest benign splenic hematoma. Urine  cultures obtained IV ceftriaxone given.  Assessment and Plan: * Acute metabolic encephalopathy -Resolved --experiencing sundowning, receiving home medication of Ativan for sleep -Continue increased daytime somnolence, sleepiness, consider adding a stimulant such as Adderall  POA: was Disoriented, confused speech.  At baseline no significant memory problems.   Lives alone. -  Baseline fecal incontinence.  No falls.  Very hard of hearing.  But answering questions appropriately,  - Encephalopathy in the setting of UTI and with infection. -Antibiotics, Paxlovid -Resume venlafaxine and bupropion.  -Will continue venlafaxine -daughter reports patient goes to withdrawal if she does not take it  -Resume Ativan 1 mg at bedtime  UTI (urinary tract infection) -Sepsis was ruled out WBC 3.6.  Afebrile.  UA suggestive of UTI. -Urine culture positive for E. Coli -pansensitive -IV ceftriaxone >> switch to p.o. ciprofloxacin--completed course of antibiotics antibiotics 10/15/2022  COVID-19 virus infection -Improved coughing, and dyspnea  -Not hypoxic.  Chest x-ray clear.   -Received at least 2 or 3 doses of COVID-vaccine. -p.o. Paxlovid--pleated 5 days treatment  Debility - Consulted PT/OT-recommending SNF  -patient can be discharged this Monday, 10/14/2022 -Patient and family agreeable -TOC consult, pursuing SNF placement  Gastroparesis -Tolerating p.o., improved nausea vomiting abdominal pain - POA: Presenting with nausea, reported LUQ pain.   - Poor oral intake.   - CT abdomen and pelvis negative for acute abnormality, suggest benign splenic hematoma. -Appetite stimulant Megace added-p.o. intake has improved  Essential hypertension Resume metoprolol -Discontinued her amlodipine-blood pressure soft  DEPRESSION/ANXIETY Resume venlafaxine, bupropion     Disposition: Skilled nursing facility Diet recommendation:  Discharge Diet Orders (  From admission, onward)     Start     Ordered    10/14/22 0000  Diet - low sodium heart healthy        10/14/22 0715           Regular diet DISCHARGE MEDICATION: Allergies as of 10/15/2022   No Known Allergies      Medication List     STOP taking these medications    acetaminophen-codeine 300-30 MG tablet Commonly known as: TYLENOL #3   amlodipine-atorvastatin 10-20 MG tablet Commonly known as: CADUET   famotidine 20 MG tablet Commonly known as: PEPCID       TAKE these medications    acetaminophen 650 MG CR tablet Commonly known as: TYLENOL Take 650 mg by mouth every 8 (eight) hours as needed for pain.   b complex vitamins capsule Take 1 capsule by mouth daily.   benzonatate 100 MG capsule Commonly known as: TESSALON Take 1 capsule (100 mg total) by mouth every 8 (eight) hours.   buPROPion 150 MG 24 hr tablet Commonly known as: WELLBUTRIN XL Take 1 tablet by mouth every morning.   dicyclomine 10 MG capsule Commonly known as: BENTYL Take 1 capsule (10 mg total) by mouth daily as needed. What changed: reasons to take this   LORazepam 1 MG tablet Commonly known as: ATIVAN Take 1 tablet (1 mg total) by mouth at bedtime for 20 days.   megestrol 400 MG/10ML suspension Commonly known as: MEGACE Take 10 mLs (400 mg total) by mouth daily.   metoprolol tartrate 25 MG tablet Commonly known as: LOPRESSOR Take 25 mg by mouth 2 (two) times daily.   multivitamin with minerals Tabs tablet Take 1 tablet by mouth daily.   pantoprazole 40 MG tablet Commonly known as: PROTONIX Take 40 mg by mouth daily.   PRESERVISION AREDS PO Take by mouth daily.   THERATEARS OP Apply to eye 2 (two) times daily.   Venlafaxine HCl 225 MG Tb24 Take 225 mg by mouth daily.        Contact information for after-discharge care     [facility] .   Service: Skilled Nursing Contact information: [address] [place] [phone]                     Discharge Exam: Danley Danker Weights   10/08/22 1108  Weight: 68 kg         General:  Hard of hearing AAO x 3,  cooperative, no distress;   HEENT:  Normocephalic, PERRL, otherwise with in Normal limits   Neuro:  CNII-XII intact. , normal motor and sensation, reflexes intact   Lungs:   Clear to auscultation BL, Respirations unlabored,  No wheezes / crackles  Cardio:    S1/S2, RRR, No murmure, No Rubs or Gallops   Abdomen:  Soft, non-tender, bowel sounds active all four quadrants, no guarding or peritoneal signs.  Muscular  skeletal:  Limited exam -global generalized weaknesses - in bed, able to move all 4 extremities,   2+ pulses,  symmetric, No pitting edema  Skin:  Dry, warm to touch, negative for any Rashes,  Wounds: Please see nursing documentation          Condition at discharge: good  The results of significant diagnostics from this hospitalization (including imaging, microbiology, ancillary and laboratory) are listed below for reference.   Imaging Studies: CT ABDOMEN PELVIS W CONTRAST  Result Date: 10/08/2022 CLINICAL  DATA:  Abdominal pain EXAM: CT ABDOMEN AND PELVIS WITH CONTRAST TECHNIQUE: Multidetector CT imaging of the abdomen and pelvis was performed using the standard protocol following bolus administration of intravenous contrast. RADIATION DOSE REDUCTION: This exam was performed according to the departmental dose-optimization program which includes automated exposure control, adjustment of the mA and/or kV according to patient size and/or use of iterative reconstruction technique. CONTRAST:  138m OMNIPAQUE IOHEXOL 300 MG/ML  SOLN COMPARISON:  04/04/2020 FINDINGS: Lower chest: No pleural fluid or airspace disease. Hepatobiliary: No focal liver abnormality. No gallstones, gallbladder wall thickening or pericholecystic fluid. No significant bile duct dilatation. Pancreas: Unremarkable. No pancreatic ductal dilatation or surrounding inflammatory changes.  Spleen: Again seen is a mass arising off the anterior aspect of the spleen which measures 7.9 x 6.3 cm, image 25/2. Unchanged when compared with 04/04/2020. Adrenals/Urinary Tract: Normal adrenal glands. No nephrolithiasis or hydronephrosis. Urinary bladder is unremarkable. Stomach/Bowel: Small hiatal hernia. The appendix is visualized and appears within normal limits. No pathologic dilatation of the large or small bowel loops. The sigmoid diverticulosis without evidence for acute diverticulitis. Vascular/Lymphatic: Aortic atherosclerosis. No aneurysm. No abdominopelvic adenopathy identified. Reproductive: Uterus and bilateral adnexa are unremarkable. Other: No free fluid or fluid collections. Musculoskeletal: No acute or suspicious osseous findings. Posterior hardware fixation and interbody fusion is been performed at the L3-4 level. Marked degenerative disc disease noted at L2-3 L4-5 and L5-S1. IMPRESSION: 1. No acute findings within the abdomen or pelvis. 2. Sigmoid diverticulosis without evidence for acute diverticulitis. 3. Stable appearance of mass arising off the anterior aspect of the spleen. Previously characterized as likely representing a benign splenic hamartoma. 4. Small hiatal hernia. 5.  Aortic Atherosclerosis (ICD10-I70.0). Electronically Signed   By: TKerby MoorsM.D.   On: 10/08/2022 14:10   DG Chest 2 View  Result Date: 10/08/2022 CLINICAL DATA:  Cough. EXAM: CHEST - 2 VIEW COMPARISON:  July 25, 2022. FINDINGS: Stable cardiomediastinal silhouette. Lung is clear. Mild right upper lobe reticular densities are noted most consistent with scarring or subsegmental atelectasis. Bony thorax is unremarkable. IMPRESSION: Mild right upper lobe scarring or subsegmental atelectasis. Electronically Signed   By: JMarijo ConceptionM.D.   On: 10/08/2022 13:34    Microbiology: Results for orders placed or performed during the hospital encounter of 10/08/22  Resp panel by RT-PCR (RSV, Flu A&B, Covid)  Anterior Nasal Swab     Status: Abnormal   Collection Time: 10/08/22 11:24 AM   Specimen: Anterior Nasal Swab  Result Value Ref Range Status   SARS Coronavirus 2 by RT PCR POSITIVE (A) NEGATIVE Final    Comment: (NOTE) SARS-CoV-2 target nucleic acids are DETECTED.  The SARS-CoV-2 RNA is generally detectable in upper respiratory specimens during the acute phase of infection. Positive results are indicative of the presence of the identified virus, but do not rule out bacterial infection or co-infection with other pathogens not detected by the test. Clinical correlation with patient history and other diagnostic information is necessary to determine patient infection status. The expected result is Negative.  Fact Sheet for Patients: hEntrepreneurPulse.com.au Fact Sheet for Healthcare Providers: hIncredibleEmployment.be This test is not yet approved or cleared by the [place] and  has been authorized for detection and/or diagnosis of SARS-CoV-2 by FDA under an Emergency Use Authorization (EUA).  This EUA will remain in effect (meaning this test can be used) for the duration of  the COVID-19 declaration under Section 564(b)(1) of the A ct, 21 U.S.C. section 360bbb-3(b)(1), unless the  authorization is terminated or revoked sooner.     Influenza A by PCR NEGATIVE NEGATIVE Final   Influenza B by PCR NEGATIVE NEGATIVE Final    Comment: (NOTE) The Xpert Xpress SARS-CoV-2/FLU/RSV plus assay is intended as an aid in the diagnosis of influenza from Nasopharyngeal swab specimens and should not be used as a sole basis for treatment. Nasal washings and aspirates are unacceptable for Xpert Xpress SARS-CoV-2/FLU/RSV testing.  Fact Sheet for Patients: EntrepreneurPulse.com.au  Fact Sheet for Healthcare Providers: IncredibleEmployment.be  This test is not yet approved or cleared by the [place] FDA and has  been authorized for detection and/or diagnosis of SARS-CoV-2 by FDA under an Emergency Use Authorization (EUA). This EUA will remain in effect (meaning this test can be used) for the duration of the COVID-19 declaration under Section 564(b)(1) of the Act, 21 U.S.C. section 360bbb-3(b)(1), unless the authorization is terminated or revoked.     Resp Syncytial Virus by PCR NEGATIVE NEGATIVE Final    Comment: (NOTE) Fact Sheet for Patients: EntrepreneurPulse.com.au  Fact Sheet for Healthcare Providers: IncredibleEmployment.be  This test is not yet approved or cleared by the [place] FDA and has been authorized for detection and/or diagnosis of SARS-CoV-2 by FDA under an Emergency Use Authorization (EUA). This EUA will remain in effect (meaning this test can be used) for the duration of the COVID-19 declaration under Section 564(b)(1) of the Act, 21 U.S.C. section 360bbb-3(b)(1), unless the authorization is terminated or revoked.  Performed at [facility], [address] [place], [place] [address]   Urine Culture     Status: Abnormal   Collection Time: 10/08/22  2:18 PM   Specimen: Urine, Clean Catch  Result Value Ref Range Status   Specimen Description URINE, CLEAN CATCH  Final   Special Requests NONE  Final   Culture >=100,000 COLONIES/mL ESCHERICHIA COLI (A)  Final   Report Status 10/11/2022 FINAL  Final   Organism ID, Bacteria ESCHERICHIA COLI (A)  Final      Susceptibility   Escherichia coli - MIC*    AMPICILLIN <=2 SENSITIVE Sensitive     CEFAZOLIN <=4 SENSITIVE Sensitive     CEFEPIME <=0.12 SENSITIVE Sensitive     CEFTRIAXONE <=0.25 SENSITIVE Sensitive     CIPROFLOXACIN <=0.25 SENSITIVE Sensitive     GENTAMICIN <=1 SENSITIVE Sensitive     IMIPENEM <=0.25 SENSITIVE Sensitive     NITROFURANTOIN 32 SENSITIVE Sensitive     TRIMETH/SULFA <=20 SENSITIVE Sensitive     AMPICILLIN/SULBACTAM <=2 SENSITIVE Sensitive     PIP/TAZO <=4  SENSITIVE Sensitive     * >=100,000 COLONIES/mL ESCHERICHIA COLI    Labs: CBC: Recent Labs  Lab 10/09/22 0447 10/10/22 0439 10/11/22 0409 10/12/22 0750 10/13/22 0506  WBC 3.8* 3.0* 3.7* 3.3* 4.3  NEUTROABS 2.3 1.5* 1.5* 1.2* 2.4  HGB 11.3* 10.9* 11.7* 11.5* 11.7*  HCT 34.4* 33.2* 35.6* 33.5* 35.2*  MCV 88.0 87.6 88.8 86.1 86.9  PLT 132* 127* 148* 127* 585*   Basic Metabolic Panel: Recent Labs  Lab 10/09/22 0447 10/10/22 0439 10/11/22 0409 10/12/22 0750 10/13/22 0506  NA 132* 133* 137 135 135  K 3.6 3.3* 3.4* 3.0* 3.3*  CL 101 102 106 104 105  CO2 '23 22 25 22 '$ 19*  GLUCOSE 80 89 91 88 89  BUN '15 14 14 13 11  '$ CREATININE 0.69 0.77 0.77 0.73 0.70  CALCIUM 7.9* 7.8* 7.9* 7.9* 8.1*  MG 1.9 1.9  --   --   --   PHOS 3.1 2.6  --   --   --  Liver Function Tests: Recent Labs  Lab 10/09/22 0447 10/10/22 0439 10/11/22 0409 10/12/22 0750 10/13/22 0506  AST 31 36 38 32 29  ALT '17 19 19 16 16  '$ ALKPHOS 70 65 67 61 59  BILITOT 0.7 0.4 0.4 0.7 0.7  PROT 5.7* 5.6* 5.7* 5.7* 5.6*  ALBUMIN 3.1* 3.1* 3.0* 3.0* 2.9*   CBG: Recent Labs  Lab 10/08/22 1404 10/08/22 1838  GLUCAP 71 84    Discharge time spent: greater than 40 minutes.  Signed: Deatra James, MD [facility] 10/15/2022

## 2022-10-15 NOTE — Care Management Important Message (Signed)
Important Message  Patient Details  Name: Whitney Galloway MRN: [id number] Date of Birth: 05-Jul-1929   Medicare Important Message Given:  Other (see comment) (Spoke with daughter Kelli Churn at [phone] to review letter, no additonal copy needed)     Tommy Medal 10/15/2022, 1:53 PM

## 2022-10-15 NOTE — Progress Notes (Signed)
Bladder scan showed 166m at 0000, MD aware.

## 2022-10-15 NOTE — Progress Notes (Signed)
Patient urinated unmeasured amount in [facility].

## 2022-10-15 NOTE — Progress Notes (Signed)
Physical Therapy Treatment Patient Details Name: Whitney Galloway MRN: [id number] DOB: [birth year] Today's Date: 10/15/2022   History of Present Illness Whitney Galloway is a 89 y.o. female with medical history significant for hypertension, depression and anxiety, hearing impairment.  Patient was brought to the ED reports of weakness, nausea and confusion.  Patient is very hard of hearing, has history from patient is limited, daughter at bedside assist with history.  Patient lives alone without significant memory problems.  Patient's daughter reports that patient has been weak, with nausea and poor oral intake, headache over the past 2 days.  She is not aware of abdominal pain.  No diarrhea.  No vomiting.  She has had a cough, but no difficulty breathing.  She is unaware of any falls.  Today patient became confused, disoriented with confused speech.  Daughter was concerned for UTI as she has had similar presentation in the past.    PT Comments    Patient agreeable and motivated for therapy.  Patient states she ambulated household distances using a cane at home and lives alone (see home living, PLOF in evaluation).  Patient demonstrates fair/good return for completing BLE exercises while seated at bedside with verbal cueing, increased endurance/distance for gait training using RW without loss of balance and limited mostly due to c/o fatigue.  Patient tolerated sitting up in chair after therapy - nursing staff notified.  Patient will benefit from continued skilled physical therapy in hospital and recommended venue below to increase strength, balance, endurance for safe ADLs and gait.     Recommendations for follow up therapy are one component of a multi-disciplinary discharge planning process, led by the attending physician.  Recommendations may be updated based on patient status, additional functional criteria and insurance authorization.  Follow Up Recommendations  Skilled nursing-short term rehab (<3  hours/day) Can patient physically be transported by private vehicle: Yes   Assistance Recommended at Discharge Intermittent Supervision/Assistance  Patient can return home with the following A little help with walking and/or transfers;A little help with bathing/dressing/bathroom;Help with stairs or ramp for entrance;Assistance with cooking/housework   Equipment Recommendations  None recommended by PT    Recommendations for Other Services       Precautions / Restrictions Precautions Precautions: Fall Restrictions Weight Bearing Restrictions: No     Mobility  Bed Mobility Overal bed mobility: Needs Assistance Bed Mobility: Supine to Sit     Supine to sit: Min guard     General bed mobility comments: labored movement with HOB flat    Transfers Overall transfer level: Needs assistance Equipment used: Rolling walker (2 wheels) Transfers: Sit to/from Stand, Bed to chair/wheelchair/BSC Sit to Stand: Min assist   Step pivot transfers: Min assist       General transfer comment: had to lean on nearby objects for support when transferring without AD, had to use RW for safety and fall risk    Ambulation/Gait Ambulation/Gait assistance: Min assist Gait Distance (Feet): 25 Feet Assistive device: Rolling walker (2 wheels) Gait Pattern/deviations: Decreased step length - right, Decreased step length - left, Decreased stride length Gait velocity: decreased     General Gait Details: increased endurance/distance for ambulation with slow labored cadence without loss of balance using RW, limited mostly due to fatigue   Stairs             Wheelchair Mobility    Modified Rankin (Stroke Patients Only)       Balance Overall balance assessment: Needs assistance Sitting-balance support: Feet supported, No  upper extremity supported Sitting balance-Leahy Scale: Good Sitting balance - Comments: seated EOB   Standing balance support: During functional activity, No upper  extremity supported Standing balance-Leahy Scale: Poor Standing balance comment: fair using RW                            Cognition Arousal/Alertness: Awake/alert Behavior During Therapy: WFL for tasks assessed/performed Overall Cognitive Status: Within Functional Limits for tasks assessed                                          Exercises General Exercises - Lower Extremity Long Arc Quad: Seated, AROM, Strengthening, Both, 10 reps Hip Flexion/Marching: Seated, AROM, Strengthening, Both, 10 reps Toe Raises: Seated, AROM, Strengthening, Both, 15 reps Heel Raises: Seated, AROM, Strengthening, Both, 15 reps    General Comments        Pertinent Vitals/Pain Pain Assessment Pain Assessment: No/denies pain    Home Living                          Prior Function            PT Goals (current goals can now be found in the care plan section) Acute Rehab PT Goals Patient Stated Goal: return home PT Goal Formulation: With patient Time For Goal Achievement: 10/23/22 Potential to Achieve Goals: Good Progress towards PT goals: Progressing toward goals    Frequency    Min 3X/week      PT Plan Current plan remains appropriate    Co-evaluation              AM-PAC PT "6 Clicks" Mobility   Outcome Measure  Help needed turning from your back to your side while in a flat bed without using bedrails?: None Help needed moving from lying on your back to sitting on the side of a flat bed without using bedrails?: A Little Help needed moving to and from a bed to a chair (including a wheelchair)?: A Little Help needed standing up from a chair using your arms (e.g., wheelchair or bedside chair)?: A Little Help needed to walk in hospital room?: A Lot Help needed climbing 3-5 steps with a railing? : A Lot 6 Click Score: 17    End of Session   Activity Tolerance: Patient tolerated treatment well;Patient limited by fatigue Patient left: in  chair;with call bell/phone within reach;with chair alarm set Nurse Communication: Mobility status PT Visit Diagnosis: Unsteadiness on feet (R26.81);Other abnormalities of gait and mobility (R26.89);Muscle weakness (generalized) (M62.81)     Time: 1035-1101 PT Time Calculation (min) (ACUTE ONLY): 26 min  Charges:  $Therapeutic Exercise: 8-22 mins $Therapeutic Activity: 8-22 mins                     12:17 PM, 10/15/22 Lonell Grandchild, MPT Physical Therapist with [facility] [phone] office [phone] mobile phone

## 2022-10-16 ENCOUNTER — Other Ambulatory Visit: Payer: Self-pay | Admitting: Adult Health

## 2022-10-16 ENCOUNTER — Non-Acute Institutional Stay (SKILLED_NURSING_FACILITY): Payer: Medicare Other | Admitting: Adult Health

## 2022-10-16 ENCOUNTER — Encounter: Payer: Self-pay | Admitting: Adult Health

## 2022-10-16 DIAGNOSIS — E876 Hypokalemia: Secondary | ICD-10-CM

## 2022-10-16 DIAGNOSIS — K3184 Gastroparesis: Secondary | ICD-10-CM

## 2022-10-16 DIAGNOSIS — G9341 Metabolic encephalopathy: Secondary | ICD-10-CM | POA: Diagnosis not present

## 2022-10-16 DIAGNOSIS — N3000 Acute cystitis without hematuria: Secondary | ICD-10-CM

## 2022-10-16 DIAGNOSIS — U071 COVID-19: Secondary | ICD-10-CM

## 2022-10-16 DIAGNOSIS — F339 Major depressive disorder, recurrent, unspecified: Secondary | ICD-10-CM

## 2022-10-16 DIAGNOSIS — E43 Unspecified severe protein-calorie malnutrition: Secondary | ICD-10-CM

## 2022-10-16 DIAGNOSIS — R5381 Other malaise: Secondary | ICD-10-CM

## 2022-10-16 DIAGNOSIS — K219 Gastro-esophageal reflux disease without esophagitis: Secondary | ICD-10-CM

## 2022-10-16 DIAGNOSIS — I10 Essential (primary) hypertension: Secondary | ICD-10-CM | POA: Diagnosis not present

## 2022-10-16 DIAGNOSIS — D649 Anemia, unspecified: Secondary | ICD-10-CM | POA: Diagnosis not present

## 2022-10-16 MED ORDER — LORAZEPAM 0.5 MG PO TABS: 0.5000 mg | ORAL_TABLET | Freq: Every day | ORAL | 0 refills | Status: DC

## 2022-10-16 NOTE — Progress Notes (Signed)
Location:  [facility] Room Number: 128 P Place of Service:  SNF (31)   CODE STATUS: DNR  No Known Allergies  Chief Complaint  Patient presents with   Hospitalization Follow-up    Follow-up from recent hospital stay 10/08/22-10/15/22 at [facility]     HPI:  She is a 89 year old woman who has been hospitalized from 10-08-22 through 10-15-22. Her medical history includes: hypertension: depression; HOH. She presented to the ED with reports of weakness; nausea and confusion. She has been living alone with help; no reports of memory problems. Her family reported that she has been weak with nausea and poor po intake and headache over 2 days. No vomiting; no diarrhea no abdominal pain. There were no reports of falls. Family concerned about UTI.  The urinalysis was indicative for uti. She was started on rocephin and was positive for  e-coli she completed abt on 10-15-22.  Acute metabolic encephalopathy: she had been experiencing sundowning. She had been using ativan at home for sleep. She had daytime somnolence. She had disorientation and confused speech. In the setting of uti. Hospital feels as though this has resolved.  She tested positive for covid 19 with improved coughing and dyspnea She is here for short term rehab. At this time it is uncertain whether her home environment will be appropriate; she may need higher level of care. She will continue to be followed for her chronic illnesses including: Gastroesophageal reflux disease/gastroparesis  Severe protein calorie malnutrition: Hypokalemia: Major depression recurrent chronic     Past Medical History:  Diagnosis Date   Bowel incontinence    Colon polyp 1994 Malignant polyp   2005 NL BE; 2006 incomplete TCS DUE TO REDUNDNACY   Diverticulosis    Fibromyalgia    Gastritis    Gastroparesis 2009   65% RETENTION AT 2 HOURS   GERD (gastroesophageal reflux disease)    Hard of hearing    Hernia of unspecified site of  abdominal cavity without mention of obstruction or gangrene    Hiatal hernia    HIATAL HERNIA 06/23/2008   Qualifier: Diagnosis of  By: Craige Cotta     HTN (hypertension)    IBS (irritable bowel syndrome)    Osteoporosis    S/P endoscopy 2007, 2009   2007: single distal esophageal erosion, s/p 56 F dilation, no web/ring/stricture noted; 2009: normal, Bravo with adequate suppression on Prevacid BID   Splenic lesion HAMARTOMA   TB (tuberculosis) AGE 36   Wears dentures    top   Wears glasses    Wears hearing aid     Past Surgical History:  Procedure Laterality Date   BACK SURGERY  2014   lumbar lam   Benign tumor removed from bilateral breast     cataract surgery     COCHLEAR IMPLANT Left 09/21/2014   Procedure: COCHLEAR IMPLANT LEFT EAR;  Surgeon: Fannie Knee, MD;  Location: [facility];  Service: ENT;  Laterality: Left;   COLONOSCOPY W/ POLYPECTOMY     ESOPHAGOGASTRODUODENOSCOPY  08/27/2011   hiatal hernia/mild gastritis   ESOPHAGOGASTRODUODENOSCOPY  05/25/2008    Normal esophagus without evidence of Barrett mass/  Normal stomach, duodenal bulb, and second portion of the duodenum   ESOPHAGOGASTRODUODENOSCOPY  08/11/2006   Single erosion at distal esophagus and a small sliding hiatal hernia/ No evidence of ring or stricture but esophagus was dilated by passing 51 [place] Maloney dilator/ Prepyloric antral erythema but no evidence of erosions or peptic  ulcer disease.   ESOPHAGOGASTRODUODENOSCOPY  09/03/2005   Small sliding hiatal hernia without evidence of ring or  stricture formation. Esophagus dilated by passing 54-French Maloney dilator   given history of dysphagia   GIVENS CAPSULE STUDY  05/27/2008   Adequate acid suppression with proton pump inhibitor.   LEFT WRIST CYST REMOVAL     LUNG SURGERY for pulmonary nodules     Mass removed from roof of mouth     SAVORY DILATION  08/27/2011   stricture in the distal esophagus   UPPER GASTROINTESTINAL ENDOSCOPY  2006    X-STOP IMPLANTATION      Social History   Socioeconomic History   Marital status: Widowed    Spouse name: Not on file   Number of children: Not on file   Years of education: Not on file   Highest education level: Not on file  Occupational History   Not on file  Tobacco Use   Smoking status: Former    Types: Cigarettes    Quit date: 09/16/2004    Years since quitting: 18.0   Smokeless tobacco: Never   Tobacco comments:    Quit x 8 years  Vaping Use   Vaping Use: Never used  Substance and Sexual Activity   Alcohol use: No   Drug use: No   Sexual activity: Never  Other Topics Concern   Not on file  Social History Narrative   Not on file   Social Determinants of Health   Financial Resource Strain: Low Risk  (09/05/2020)   Overall Financial Resource Strain (CARDIA)    Difficulty of Paying Living Expenses: Not hard at all  Food Insecurity: No Food Insecurity (10/08/2022)   Hunger Vital Sign    Worried About Running Out of Food in the Last Year: Never true    [place] in the Last Year: Never true  Transportation Needs: No Transportation Needs (10/08/2022)   PRAPARE - Hydrologist (Medical): No    Lack of Transportation (Non-Medical): No  Physical Activity: Inactive (09/05/2020)   Exercise Vital Sign    Days of Exercise per Week: 0 days    Minutes of Exercise per Session: 0 min  Stress: Stress Concern Present (09/05/2020)   [place]    Feeling of Stress : To some extent  Social Connections: Moderately Isolated (09/05/2020)   Social Connection and Isolation Panel [NHANES]    Frequency of Communication with Friends and Family: Three times a week    Frequency of Social Gatherings with Friends and Family: Three times a week    Attends Religious Services: Never    Active Member of Clubs or Organizations: Yes    Attends Archivist Meetings: Never    Marital  Status: Widowed  Intimate Partner Violence: Not At Risk (10/08/2022)   Humiliation, Afraid, Rape, and Kick questionnaire    Fear of Current or Ex-Partner: No    Emotionally Abused: No    Physically Abused: No    Sexually Abused: No   Family History  Problem Relation Age of Onset   Colon cancer Neg Hx       VITAL SIGNS BP (!) 100/54 Comment: B/P recorded according to [employer] EMR system  Pulse 74   Temp (!) 97 F (36.1 C)   Resp 20   Ht '5\' 8"'$  (1.727 m)   Wt 133 lb (60.3 kg) Comment: Weight recorded according to [facility] EMR  system  SpO2 97%   BMI 20.22 kg/m   Outpatient Encounter Medications as of 10/16/2022  Medication Sig   acetaminophen (TYLENOL) 650 MG CR tablet Take 650 mg by mouth every 8 (eight) hours as needed for pain.   b complex vitamins capsule Take 1 capsule by mouth daily.   benzonatate (TESSALON) 100 MG capsule Take 1 capsule (100 mg total) by mouth every 8 (eight) hours.   buPROPion (WELLBUTRIN XL) 150 MG 24 hr tablet Take 1 tablet by mouth every morning.   Carboxymethylcellulose Sodium (THERATEARS OP) Apply to eye 2 (two) times daily.   dicyclomine (BENTYL) 10 MG capsule Take 1 capsule (10 mg total) by mouth daily as needed.   LORazepam (ATIVAN) 1 MG tablet Take 1 tablet (1 mg total) by mouth at bedtime for 20 days.   metoprolol tartrate (LOPRESSOR) 25 MG tablet Take 25 mg by mouth 2 (two) times daily.   Multiple Vitamin (MULTIVITAMIN WITH MINERALS) TABS tablet Take 1 tablet by mouth daily.   Multiple Vitamins-Minerals (PRESERVISION AREDS PO) Take by mouth daily.   pantoprazole (PROTONIX) 40 MG tablet Take 40 mg by mouth daily.   UNABLE TO FIND Diet: Regular with thin liquids   Venlafaxine HCl 225 MG TB24 Take 225 mg by mouth daily.    [DISCONTINUED] megestrol (MEGACE) 400 MG/10ML suspension Take 10 mLs (400 mg total) by mouth daily.   No facility-administered encounter medications on file as of 10/16/2022.     SIGNIFICANT DIAGNOSTIC  EXAMS  TODAY  10-08-22: ct of abdomen and pelvis:  1. No acute findings within the abdomen or pelvis. 2. Sigmoid diverticulosis without evidence for acute diverticulitis. 3. Stable appearance of mass arising off the anterior aspect of the spleen. Previously characterized as likely representing a benign splenic hamartoma. 4. Small hiatal hernia. 5.  Aortic Atherosclerosis   10-08-22: chest x-ray:  Mild right upper lobe scarring or subsegmental atelectasis.  LABS REVIEWED:   10-08-22: wbc 3.6; hgb 11.9; hct 36.3; mcv 89.0 plt 148; glucose 98; bun 13; creat 0.70; k+ 3.7; na++ 135; ca 8.3; gfr >60; protein 6.1; albumin 3.4; urine culture: e-coli 10-11-22: wbc 3.7; hgb 11.7; hct 35.6 mcv 88.8 plt 147; glucose 91; bun 14; creat 0.77; k + 3.4; na++ 13; ca 7.9; gfr >60; protein 5.7 albumin 3.0 10-13-22: wbc 4.3; hgb 11.7; hct 35.2; mcv 86.9 plt 136; glucose 89; bun 11; creat 0.70; k+ 3.3; na++ 135; ca 8.1; gfr >60; protein 5.6; albumin 2.9  Review of Systems  Reason unable to perform ROS: very HOH irritable.   Physical Exam Constitutional:      General: She is not in acute distress.    Appearance: She is well-developed. She is not diaphoretic.  HENT:     Ears:     Comments: HOH Neck:     Thyroid: No thyromegaly.  Cardiovascular:     Rate and Rhythm: Normal rate and regular rhythm.     Pulses: Normal pulses.     Heart sounds: Normal heart sounds.  Pulmonary:     Effort: Pulmonary effort is normal. No respiratory distress.     Breath sounds: Normal breath sounds.  Abdominal:     General: Bowel sounds are normal. There is no distension.     Palpations: Abdomen is soft.     Tenderness: There is no abdominal tenderness.  Musculoskeletal:        General: Normal range of motion.     Cervical back: Neck supple.     Right lower leg:  No edema.     Left lower leg: No edema.     Comments: Kyphosis   Lymphadenopathy:     Cervical: No cervical adenopathy.  Skin:    General: Skin is warm and dry.   Neurological:     Mental Status: She is alert. Mental status is at baseline.  Psychiatric:     Comments: Irritable       ASSESSMENT/ PLAN:  TODAY  Acute metabolic encephalopathy/debility/acute cystitis: has completed abt; will continue therapy as directed to improve upon her level of independence with her adls.   2. Essential hypertension; b/p 100/54: will lower her lopressor to 12.5 mg twice daily   3. Chronic anemia: hgb 11.7  4. COVID 19 virus infection: will continue benzonatate 100 mg every 8 hours for her cough  5. Gastroesophageal reflux disease/gastroparesis/ will continue protonix 40 mg daily bentyl 10 mg daily as needed for spasms  6. Severe protein calorie malnutrition: albumin 2.9 will begin prostat 30 mL three times daily   7. Hypokalemia: will repeat labs  8. Major depression recurrent chronic: will continue wellbutrin xl 150 mg daily effexor 225 mg daily will lower ativan to 0.5 mg nightly through 11-03-22.   9. Aortic atherosclerosis (10-08-22)   Will check BMP    Ok Edwards NP [facility]  call [phone]

## 2022-10-17 ENCOUNTER — Non-Acute Institutional Stay (SKILLED_NURSING_FACILITY): Payer: Medicare Other | Admitting: Internal Medicine

## 2022-10-17 ENCOUNTER — Encounter: Payer: Self-pay | Admitting: Internal Medicine

## 2022-10-17 DIAGNOSIS — G9341 Metabolic encephalopathy: Secondary | ICD-10-CM

## 2022-10-17 DIAGNOSIS — U071 COVID-19: Secondary | ICD-10-CM

## 2022-10-17 DIAGNOSIS — N3000 Acute cystitis without hematuria: Secondary | ICD-10-CM

## 2022-10-17 DIAGNOSIS — E43 Unspecified severe protein-calorie malnutrition: Secondary | ICD-10-CM

## 2022-10-17 DIAGNOSIS — D649 Anemia, unspecified: Secondary | ICD-10-CM

## 2022-10-17 NOTE — Progress Notes (Unsigned)
NURSING HOME [facility]ATION:  [facility] ROOM NUMBER:  128  CODE STATUS:  DNR  PCP:  Celene Squibb MD  This is a comprehensive admission note to this SNFperformed on this date less than 30 days from date of admission. Included are preadmission medical/surgical history; reconciled medication list; family history; social history and comprehensive review of systems.  Corrections and additions to the records were documented. Comprehensive physical exam was also performed. Additionally a clinical summary was entered for each active diagnosis pertinent to this admission in the Problem List to enhance continuity of care.  HPI: She was hospitalized 1/2 - 10/15/2022 presenting with weakness, nausea, and confusion.  The history came from the patient's daughter who reported progressive weakness and nausea associated with poor oral intake over the 48 hours PTA.  Patient also had a cough without associated dyspnea.  The morning of admission the patient became confused and disoriented.  In the ED respiratory rate varied from 14 up to 26.  Systolic blood pressure was noted to be as high as 156.  O2 sats ranged from 91-98% on room air.  COVID testing was positive.  D-dimer peaked at 0.59 and C-reactive protein at 2.4.  Patient received a full course of Paxlovid. UA revealed trace leukocytes and positive nitrates.  According the daughter the patient has had acute mental status changes in the past with UTIs.  Despite the positive COVID testing, chest x-ray was negative for any acute process.  CT of the abdomen suggested a splenic hamartoma. IV ceftriaxone was initiated empirically.  Urine culture revealed E. coli.  The patient was transitioned to oral Cipro which was to be continued until 1/9. Albumin was 3.0 and total protein 5.7, suggesting protein/caloric malnutrition.  Normochromic, normocytic anemia was present with H/H of 11.8/36.3.  Platelet count was 148,000; there was no overt bleeding. With  treatment of the COVID and UTI and rehydration; her acute mental status changes resolved. The patient does live alone and because of fraility PT/OT recommended SNF placement for rehab.  Past medical and surgical history: Includes history of essential hypertension, anxiety/depression, history of diverticulosis, history of colon polyp, fibromyalgia, GERD, IBS, osteoporosis, and history of TB.  Surgeries and procedures include cochlear implant on the left, colonoscopy with polypectomy, EGD, and savory dilation. Social history: Former smoker; she quit almost 20 years ago.  Nondrinker.  Family history: Noncontributory due to advanced age.   Review of systems: Major complaint was fatigue related to physical therapy which she referred to as "watcha me call it."  When asked why she had been in the hospital her response was "COID."  She states that her throat stays dry.  Cough is productive of white phlegm.  She describes both constipation as well as diarrhea stating "I have no control over my bowels.  She describes occasional abdominal cramping.  She describes chronic soreness in the left anterior axillary area.  This has been previously evaluated.  Constitutional: No fever, significant weight change  Eyes: No redness, discharge, pain, vision change ENT/mouth: No nasal congestion, purulent discharge, earache, change in hearing, sore throat  Cardiovascular: No chest pain, palpitations, paroxysmal nocturnal dyspnea, claudication, edema  Respiratory: No hemoptysis, DOE, significant snoring, apnea  Gastrointestinal: No heartburn, dysphagia, nausea /vomiting, rectal bleeding, melena Genitourinary: No dysuria, hematuria, pyuria, incontinence, nocturia Musculoskeletal: No joint stiffness, joint swelling, weakness Dermatologic: No rash, pruritus, change in appearance of skin Neurologic: No dizziness, headache, syncope, seizures, numbness, tingling Psychiatric: No significant anxiety, depression, insomnia,  anorexia Endocrine:  No change in hair/skin/nails, excessive thirst, excessive hunger, excessive urination  Hematologic/lymphatic: No significant bruising, lymphadenopathy, abnormal bleeding Allergy/immunology: No itchy/watery eyes, significant sneezing, urticaria, angioedema  Physical exam:  Pertinent or positive findings: She appears her age and somewhat suboptimally nourished.  Hair is stark white and disheveled.  She is essentially deaf.  Facies are weathered.  Eyebrows are absent.  Eyes are sunken.  She has bilateral ptosis.  Despite the complaint of mouth dryness; the oral cavity was moist.  She was not wearing her upper plate.  She has multiple missing mandibular teeth and the remaining teeth are plaque encrusted.  Heart sounds are distant and rhythm is irregular.  Breath sounds are decreased.  Bowel sounds are hyperactive.  Pedal pulses are decreased but palpable.  She has slight clubbing the nailbeds.  Limb atrophy is present but she is surprisingly strong to opposition in all extremities.  At rest she has a slight tremor of her hands.  General appearance:no acute distress, increased work of breathing is present.   Lymphatic: No lymphadenopathy about the head, neck, axilla. Eyes: No conjunctival inflammation or lid edema is present. There is no scleral icterus. Ears:  External ear exam shows no significant lesions or deformities.   Nose:  External nasal examination shows no deformity or inflammation. Nasal mucosa are pink and moist without lesions, exudates Neck:  No thyromegaly, masses, tenderness noted.    Heart:  No gallop, murmur, click, rub.  Lungs:  without wheezes, rhonchi, rales, rubs. Abdomen: Abdomen is soft and nontender with no organomegaly, hernias, masses. GU: Deferred  Extremities:  No cyanosis, edema. Neurologic exam:  Balance, Rhomberg, finger to nose testing could not be completed due to clinical state Skin: Warm & dry w/o tenting. No significant lesions or rash.  See  clinical summary under each active problem in the Problem List with associated updated therapeutic plan

## 2022-10-17 NOTE — Patient Instructions (Signed)
See assessment and plan under each diagnosis in the problem list and acutely for this visit 

## 2022-10-17 NOTE — Assessment & Plan Note (Signed)
N/N anemia essentially stable; current H/H 11.8/36.3

## 2022-10-17 NOTE — Assessment & Plan Note (Signed)
Residual fatigue & cough with white sputum.

## 2022-10-17 NOTE — Assessment & Plan Note (Signed)
Fairly good historian despite profound deafness. Knew month & year.

## 2022-10-17 NOTE — Assessment & Plan Note (Addendum)
Current albumin 3.0 & total protein 5.7. Despite interosseous wasting & limb atrophy , she is surprisingly strong to oppositon in all extremities. Nutritionist to follow @ SNF.

## 2022-10-17 NOTE — Assessment & Plan Note (Signed)
S/P IV Ceftriaxone transitioned to Cipro , complete as of 1/9. No GU symptoms

## 2022-10-21 ENCOUNTER — Other Ambulatory Visit (HOSPITAL_COMMUNITY)
Admission: RE | Admit: 2022-10-21 | Discharge: 2022-10-21 | Disposition: A | Discharge status: Home / Self Care | Payer: Medicare Other | Source: Skilled Nursing Facility | Attending: Adult Health | Admitting: Adult Health

## 2022-10-21 DIAGNOSIS — I10 Essential (primary) hypertension: Secondary | ICD-10-CM | POA: Insufficient documentation

## 2022-10-21 LAB — BASIC METABOLIC PANEL
Anion gap: 9 (ref 5–15)
BUN: 24 mg/dL — ABNORMAL HIGH (ref 8–23)
CO2: 22 mmol/L (ref 22–32)
Calcium: 8.4 mg/dL — ABNORMAL LOW (ref 8.9–10.3)
Chloride: 106 mmol/L (ref 98–111)
Creatinine, Ser: 0.74 mg/dL (ref 0.44–1.00)
GFR, Estimated: >60 mL/min (ref >60)
Glucose, Bld: 89 mg/dL (ref 70–99)
Potassium: 4 mmol/L (ref 3.5–5.1)
Sodium: 137 mmol/L (ref 135–145)

## 2022-10-25 ENCOUNTER — Encounter: Payer: Self-pay | Admitting: Adult Health

## 2022-10-25 ENCOUNTER — Non-Acute Institutional Stay (SKILLED_NURSING_FACILITY): Payer: Medicare Other | Admitting: Adult Health

## 2022-10-25 DIAGNOSIS — E43 Unspecified severe protein-calorie malnutrition: Secondary | ICD-10-CM

## 2022-10-25 DIAGNOSIS — U071 COVID-19: Secondary | ICD-10-CM | POA: Diagnosis not present

## 2022-10-25 DIAGNOSIS — I10 Essential (primary) hypertension: Secondary | ICD-10-CM

## 2022-10-25 DIAGNOSIS — N3000 Acute cystitis without hematuria: Secondary | ICD-10-CM

## 2022-10-25 NOTE — Progress Notes (Signed)
Location:  [place] Room Number: NO/128/P Place of Service:  SNF (31) Ok Edwards S.,NP  CODE STATUS: DNR  No Known Allergies  Chief Complaint  Patient presents with   Acute Visit    Patient is being patient is being discharged    HPI:  She is being discharged to home with home health for pt/ot. She will need a rolling walker. She will need to have her prescriptions filled and will need to follow up with her medical provider. She had been hospitalized for covid 19; e-coli uti and acute metabolic encephalopathy. She was not ready to return home at that time. She was admitted here for short term rehab. She has participated in therapy to improve upon her level of independence with her adls. She is ready for discharge to home.   Past Medical History:  Diagnosis Date   Bowel incontinence    Colon polyp 1994 Malignant polyp   2005 NL BE; 2006 incomplete TCS DUE TO REDUNDNACY   Diverticulosis    Fibromyalgia    Gastritis    Gastroparesis 2009   65% RETENTION AT 2 HOURS   GERD (gastroesophageal reflux disease)    Hard of hearing    Hernia of unspecified site of abdominal cavity without mention of obstruction or gangrene    Hiatal hernia    HIATAL HERNIA 06/23/2008   Qualifier: Diagnosis of  By: Craige Cotta     HTN (hypertension)    IBS (irritable bowel syndrome)    Osteoporosis    S/P endoscopy 2007, 2009   2007: single distal esophageal erosion, s/p 56 F dilation, no web/ring/stricture noted; 2009: normal, Bravo with adequate suppression on Prevacid BID   Splenic lesion HAMARTOMA   TB (tuberculosis) AGE 68   Wears dentures    top   Wears glasses    Wears hearing aid     Past Surgical History:  Procedure Laterality Date   BACK SURGERY  2014   lumbar lam   Benign tumor removed from bilateral breast     cataract surgery     COCHLEAR IMPLANT Left 09/21/2014   Procedure: COCHLEAR IMPLANT LEFT EAR;  Surgeon: Fannie Knee, MD;  Location: [facility];  Service: ENT;  Laterality: Left;   COLONOSCOPY W/ POLYPECTOMY     ESOPHAGOGASTRODUODENOSCOPY  08/27/2011   hiatal hernia/mild gastritis   ESOPHAGOGASTRODUODENOSCOPY  05/25/2008    Normal esophagus without evidence of Barrett mass/  Normal stomach, duodenal bulb, and second portion of the duodenum   ESOPHAGOGASTRODUODENOSCOPY  08/11/2006   Single erosion at distal esophagus and a small sliding hiatal hernia/ No evidence of ring or stricture but esophagus was dilated by passing 84 [place] Maloney dilator/ Prepyloric antral erythema but no evidence of erosions or peptic ulcer disease.   ESOPHAGOGASTRODUODENOSCOPY  09/03/2005   Small sliding hiatal hernia without evidence of ring or  stricture formation. Esophagus dilated by passing 54-French Maloney dilator   given history of dysphagia   GIVENS CAPSULE STUDY  05/27/2008   Adequate acid suppression with proton pump inhibitor.   LEFT WRIST CYST REMOVAL     LUNG SURGERY for pulmonary nodules     Mass removed from roof of mouth     SAVORY DILATION  08/27/2011   stricture in the distal esophagus   UPPER GASTROINTESTINAL ENDOSCOPY  2006   X-STOP IMPLANTATION      Social History   Socioeconomic History   Marital status: Widowed    Spouse name: Not on file  Number of children: Not on file   Years of education: Not on file   Highest education level: Not on file  Occupational History   Not on file  Tobacco Use   Smoking status: Former    Types: Cigarettes    Quit date: 09/16/2004    Years since quitting: 18.1   Smokeless tobacco: Never   Tobacco comments:    Quit x 8 years  Vaping Use   Vaping Use: Never used  Substance and Sexual Activity   Alcohol use: No   Drug use: No   Sexual activity: Never  Other Topics Concern   Not on file  Social History Narrative   Not on file   Social Determinants of Health   Financial Resource Strain: Low Risk  (09/05/2020)   Overall Financial Resource Strain (CARDIA)     Difficulty of Paying Living Expenses: Not hard at all  Food Insecurity: No Food Insecurity (10/08/2022)   Hunger Vital Sign    Worried About Running Out of Food in the Last Year: Never true    [place] in the Last Year: Never true  Transportation Needs: No Transportation Needs (10/08/2022)   PRAPARE - Hydrologist (Medical): No    Lack of Transportation (Non-Medical): No  Physical Activity: Inactive (09/05/2020)   Exercise Vital Sign    Days of Exercise per Week: 0 days    Minutes of Exercise per Session: 0 min  Stress: Stress Concern Present (09/05/2020)   [place]    Feeling of Stress : To some extent  Social Connections: Moderately Isolated (09/05/2020)   Social Connection and Isolation Panel [NHANES]    Frequency of Communication with Friends and Family: Three times a week    Frequency of Social Gatherings with Friends and Family: Three times a week    Attends Religious Services: Never    Active Member of Clubs or Organizations: Yes    Attends Archivist Meetings: Never    Marital Status: Widowed  Intimate Partner Violence: Not At Risk (10/08/2022)   Humiliation, Afraid, Rape, and Kick questionnaire    Fear of Current or Ex-Partner: No    Emotionally Abused: No    Physically Abused: No    Sexually Abused: No   Family History  Problem Relation Age of Onset   Colon cancer Neg Hx       VITAL SIGNS BP 119/69   Pulse 63   Temp 98.1 F (36.7 C)   Resp 18   Ht '5\' 8"'$  (1.727 m)   Wt 135 lb 6.4 oz (61.4 kg)   SpO2 98%   BMI 20.59 kg/m   Outpatient Encounter Medications as of 10/25/2022  Medication Sig   acetaminophen (TYLENOL) 650 MG CR tablet Take 650 mg by mouth every 8 (eight) hours as needed for pain.   b complex vitamins capsule Take 1 capsule by mouth daily.   benzonatate (TESSALON) 100 MG capsule Take 1 capsule (100 mg total) by mouth every 8 (eight) hours.    buPROPion (WELLBUTRIN XL) 150 MG 24 hr tablet Take 1 tablet by mouth every morning.   Carboxymethylcellulose Sodium (THERATEARS OP) Apply to eye 2 (two) times daily.   dicyclomine (BENTYL) 10 MG capsule Take 1 capsule (10 mg total) by mouth daily as needed.   LORazepam (ATIVAN) 0.5 MG tablet Take 1 tablet (0.5 mg total) by mouth at bedtime for 18 days.   metoprolol tartrate (LOPRESSOR)  25 MG tablet Take 25 mg by mouth 2 (two) times daily.   Multiple Vitamin (MULTIVITAMIN WITH MINERALS) TABS tablet Take 1 tablet by mouth daily.   Multiple Vitamins-Minerals (PRESERVISION AREDS PO) Take by mouth daily.   pantoprazole (PROTONIX) 40 MG tablet Take 40 mg by mouth daily.   UNABLE TO FIND Diet: Regular with thin liquids   Venlafaxine HCl 225 MG TB24 Take 225 mg by mouth daily.    No facility-administered encounter medications on file as of 10/25/2022.     SIGNIFICANT DIAGNOSTIC EXAMS  PREVIOUS   10-08-22: ct of abdomen and pelvis:  1. No acute findings within the abdomen or pelvis. 2. Sigmoid diverticulosis without evidence for acute diverticulitis. 3. Stable appearance of mass arising off the anterior aspect of the spleen. Previously characterized as likely representing a benign splenic hamartoma. 4. Small hiatal hernia. 5.  Aortic Atherosclerosis   10-08-22: chest x-ray:  Mild right upper lobe scarring or subsegmental atelectasis.  NO NEW EXAMS   LABS REVIEWED: PREVIOUS   10-08-22: wbc 3.6; hgb 11.9; hct 36.3; mcv 89.0 plt 148; glucose 98; bun 13; creat 0.70; k+ 3.7; na++ 135; ca 8.3; gfr >60; protein 6.1; albumin 3.4; urine culture: e-coli 10-11-22: wbc 3.7; hgb 11.7; hct 35.6 mcv 88.8 plt 147; glucose 91; bun 14; creat 0.77; k + 3.4; na++ 13; ca 7.9; gfr >60; protein 5.7 albumin 3.0 10-13-22: wbc 4.3; hgb 11.7; hct 35.2; mcv 86.9 plt 136; glucose 89; bun 11; creat 0.70; k+ 3.3; na++ 135; ca 8.1; gfr >60; protein 5.6; albumin 2.9  NO NEW LABS.   Review of Systems  Unable to perform ROS:  Dementia    Physical Exam Constitutional:      General: She is not in acute distress.    Appearance: She is well-developed. She is not diaphoretic.  HENT:     Ears:     Comments: hoh Neck:     Thyroid: No thyromegaly.  Cardiovascular:     Rate and Rhythm: Normal rate and regular rhythm.     Pulses: Normal pulses.     Heart sounds: Normal heart sounds.  Pulmonary:     Effort: Pulmonary effort is normal. No respiratory distress.     Breath sounds: Normal breath sounds.  Abdominal:     General: Bowel sounds are normal. There is no distension.     Palpations: Abdomen is soft.     Tenderness: There is no abdominal tenderness.  Musculoskeletal:        General: Normal range of motion.     Cervical back: Neck supple.     Right lower leg: No edema.     Left lower leg: No edema.     Comments: Kyphosis  Lymphadenopathy:     Cervical: No cervical adenopathy.  Skin:    General: Skin is warm and dry.  Neurological:     Mental Status: She is alert. Mental status is at baseline.  Psychiatric:        Mood and Affect: Mood normal.      ASSESSMENT/ PLAN:   Patient is being discharged with the following home health services:  pt/ot to evaluate and treat as indicated for gait balance strength adl training.   Patient is being discharged with the following durable medical equipment:  rolling walker   Patient has been advised to f/u with their PCP in 1-2 weeks to for a transitions of care visit.  Social services at their facility was responsible for arranging this appointment.  Pt was  provided with adequate prescriptions of noncontrolled medications to reach the scheduled appointment .  For controlled substances, a limited supply was provided as appropriate for the individual patient.  If the pt normally receives these medications from a pain clinic or has a contract with another physician, these medications should be received from that clinic or physician only).    A 30 day supply of her  prescription medications have been sent to [facility] in [place]  Time spent with patient: 35 minutes: dme; medications; home health.   Ok Edwards NP [facility]  call [phone]

## 2022-10-28 ENCOUNTER — Other Ambulatory Visit: Payer: Self-pay | Admitting: Adult Health

## 2022-10-28 MED ORDER — DICYCLOMINE HCL 10 MG PO CAPS: 10.0000 mg | ORAL_CAPSULE | Freq: Every day | ORAL | 0 refills | Status: DC | PRN

## 2022-10-28 MED ORDER — BUPROPION HCL ER (XL) 150 MG PO TB24: 150.0000 mg | ORAL_TABLET | Freq: Every morning | ORAL | 0 refills | Status: AC

## 2022-10-28 MED ORDER — PANTOPRAZOLE SODIUM 40 MG PO TBEC: 40.0000 mg | DELAYED_RELEASE_TABLET | Freq: Every day | ORAL | 0 refills | Status: DC

## 2022-10-28 MED ORDER — METOPROLOL TARTRATE 25 MG PO TABS: 25.0000 mg | ORAL_TABLET | Freq: Two times a day (BID) | ORAL | 0 refills | Status: AC

## 2022-10-28 MED ORDER — LORAZEPAM 0.5 MG PO TABS: 0.5000 mg | ORAL_TABLET | Freq: Every day | ORAL | 0 refills | Status: AC

## 2022-10-28 MED ORDER — VENLAFAXINE HCL ER 225 MG PO TB24: 225.0000 mg | ORAL_TABLET | Freq: Every day | ORAL | 0 refills | Status: AC

## 2022-10-28 MED ORDER — BENZONATATE 100 MG PO CAPS: 100.0000 mg | ORAL_CAPSULE | Freq: Three times a day (TID) | ORAL | 0 refills | Status: DC

## 2022-12-05 ENCOUNTER — Encounter: Payer: Self-pay | Admitting: Radiology

## 2023-01-07 ENCOUNTER — Ambulatory Visit: Payer: Medicare Other | Admitting: Internal Medicine

## 2023-02-09 ENCOUNTER — Emergency Department (HOSPITAL_COMMUNITY): Payer: Medicare Other

## 2023-02-09 ENCOUNTER — Encounter (HOSPITAL_COMMUNITY): Payer: Self-pay | Admitting: Emergency Medicine

## 2023-02-09 ENCOUNTER — Emergency Department (HOSPITAL_COMMUNITY)
Admission: EM | Admit: 2023-02-09 | Discharge: 2023-02-09 | Disposition: A | Discharge status: Home / Self Care | Payer: Medicare Other | Attending: Emergency Medicine | Admitting: Emergency Medicine

## 2023-02-09 ENCOUNTER — Other Ambulatory Visit: Payer: Self-pay

## 2023-02-09 DIAGNOSIS — S93401A Sprain of unspecified ligament of right ankle, initial encounter: Secondary | ICD-10-CM | POA: Insufficient documentation

## 2023-02-09 DIAGNOSIS — W1830XA Fall on same level, unspecified, initial encounter: Secondary | ICD-10-CM | POA: Insufficient documentation

## 2023-02-09 DIAGNOSIS — M25571 Pain in right ankle and joints of right foot: Secondary | ICD-10-CM | POA: Diagnosis present

## 2023-02-09 DIAGNOSIS — S96911A Strain of unspecified muscle and tendon at ankle and foot level, right foot, initial encounter: Secondary | ICD-10-CM | POA: Diagnosis not present

## 2023-02-09 DIAGNOSIS — W19XXXA Unspecified fall, initial encounter: Secondary | ICD-10-CM

## 2023-02-09 MED ORDER — ACETAMINOPHEN 325 MG PO TABS
650.0000 mg | ORAL_TABLET | Freq: Once | ORAL | Status: AC
  Administered 2023-02-09: 650 mg via ORAL
  Filled 2023-02-09: qty 2

## 2023-02-09 NOTE — ED Provider Notes (Signed)
[place] EMERGENCY DEPARTMENT AT [facility] Provider Note   CSN: [id number] Arrival date & time: 02/09/23  0935     History  Chief Complaint  Patient presents with   Whitney Galloway is a 89 y.o. female.  Patient c/o fall yesterday. Indicates remote hx back surgery, and indicates occasional right foot will get stuck and cause her to fall. Pt c/o right foot and ankle pain. Denies loc or head injury. No headache. No new neck or back pain. No radicular pain. No new numbness or weakness. Denies any faintness or dizziness prior to fall. No anticoagulant use. Has walker, but did not have with her when she fell.   The history is provided by the patient, medical records, a relative and the EMS personnel.  Fall Pertinent negatives include no chest pain, no abdominal pain, no headaches and no shortness of breath.       Home Medications Prior to Admission medications   Medication Sig Start Date End Date Taking? Authorizing Provider  acetaminophen (TYLENOL) 650 MG CR tablet Take 650 mg by mouth every 8 (eight) hours as needed for pain.    [provider]  b complex vitamins capsule Take 1 capsule by mouth daily.    [provider]  benzonatate (TESSALON) 100 MG capsule Take 1 capsule (100 mg total) by mouth every 8 (eight) hours. 10/28/22   Sharee Holster, NP  buPROPion (WELLBUTRIN XL) 150 MG 24 hr tablet Take 1 tablet (150 mg total) by mouth every morning. 10/28/22   Sharee Holster, NP  Carboxymethylcellulose Sodium (THERATEARS OP) Apply to eye 2 (two) times daily.    [provider]  dicyclomine (BENTYL) 10 MG capsule Take 1 capsule (10 mg total) by mouth daily as needed. 10/28/22   Sharee Holster, NP  metoprolol tartrate (LOPRESSOR) 25 MG tablet Take 1 tablet (25 mg total) by mouth 2 (two) times daily. 10/28/22   Sharee Holster, NP  Multiple Vitamin (MULTIVITAMIN WITH MINERALS) TABS tablet Take 1 tablet by mouth daily.    [provider]  Multiple Vitamins-Minerals (PRESERVISION AREDS PO) Take by mouth daily.    [provider]  pantoprazole (PROTONIX) 40 MG tablet Take 1 tablet (40 mg total) by mouth daily. 10/28/22   Sharee Holster, NP  UNABLE TO FIND Diet: Regular with thin liquids    [provider]  Venlafaxine HCl 225 MG TB24 Take 1 tablet (225 mg total) by mouth daily. 10/28/22   Sharee Holster, NP      Allergies    Patient has no known allergies.    Review of Systems   Review of Systems  Constitutional:  Negative for fever.  HENT:  Negative for nosebleeds.   Eyes:  Negative for pain.  Respiratory:  Negative for shortness of breath.   Cardiovascular:  Negative for chest pain.  Gastrointestinal:  Negative for abdominal pain, nausea and vomiting.  Genitourinary:  Negative for flank pain.  Musculoskeletal:  Negative for back pain and neck pain.  Skin:  Negative for wound.  Neurological:  Negative for weakness, numbness and headaches.  Hematological:  Does not bruise/bleed easily.  Psychiatric/Behavioral:  Negative for confusion.     Physical Exam Updated Vital Signs BP (!) 118/50   Pulse 66   Temp 97.6 F (36.4 C) (Oral)   Ht 1.702 m (5\' 7" )   Wt 65.8 kg   SpO2 98%   BMI 22.71 kg/m  Physical Exam Vitals  and nursing note reviewed.  Constitutional:      Appearance: Normal appearance. She is well-developed.  HENT:     Head: Atraumatic.     Comments: No head or scalp contusion.    Nose: Nose normal.     Mouth/Throat:     Mouth: Mucous membranes are moist.  Eyes:     General: No scleral icterus.    Conjunctiva/sclera: Conjunctivae normal.     Pupils: Pupils are equal, round, and reactive to light.  Neck:     Trachea: No tracheal deviation.  Cardiovascular:     Rate and Rhythm: Normal rate and regular rhythm.     Pulses: Normal pulses.     Heart sounds: Normal heart sounds. No murmur heard.    No friction rub. No gallop.  Pulmonary:     Effort: Pulmonary  effort is normal. No respiratory distress.     Breath sounds: Normal breath sounds.  Chest:     Chest wall: No tenderness.  Abdominal:     General: Bowel sounds are normal. There is no distension.     Palpations: Abdomen is soft.     Tenderness: There is no abdominal tenderness.     Comments: No abd contusion  Genitourinary:    Comments: No cva tenderness.  Musculoskeletal:        General: No swelling.     Cervical back: Normal range of motion and neck supple. No rigidity or tenderness. No muscular tenderness.     Comments: CTLS spine, non tender, aligned, no step off. Tenderness right mid foot and lateral malleolus of ankle, otherwise, good rom bilateral extremities without other pain or focal bony tenderness.   Skin:    General: Skin is warm and dry.     Findings: No rash.  Neurological:     Mental Status: She is alert.     Comments: Alert, speech normal. GCS 15. +HOH. Motor/sens grossly intact bil.   Psychiatric:        Mood and Affect: Mood normal.     ED Results / Procedures / Treatments   Labs (all labs ordered are listed, but only abnormal results are displayed) Labs Reviewed - No data to display  EKG None  Radiology DG Ankle Complete Right  Result Date: 02/09/2023 CLINICAL DATA:  89 year old female status post falls yesterday. Ongoing pain. EXAM: RIGHT ANKLE - COMPLETE 3+ VIEW COMPARISON:  Right foot series today. Previous ankle series 09/29/2019. FINDINGS: Mortise joint alignment maintained. Evidence of ankle joint effusion on the lateral. Talar dome intact. Lateral more so than medial soft tissue swelling. Distal fibula metadiaphysis cortical lucency on image #2 is unchanged compared to 2020, presumed nutrient foramen. No acute fracture is identified. Right fibula metadiaphysis with subtle cortical lucency on image #2. Distal tibia appears intact. Calcaneus intact. IMPRESSION: Evidence of ankle joint effusion and soft tissue swelling. No acute fracture or dislocation  identified about the right ankle. Electronically Signed   By: Odessa Fleming M.D.   On: 02/09/2023 12:09   DG Foot Complete Right  Result Date: 02/09/2023 CLINICAL DATA:  89 year old female status post falls yesterday. Ongoing pain. EXAM: RIGHT FOOT COMPLETE - 3+ VIEW COMPARISON:  Previous ankle series 09/29/2019. FINDINGS: Bone mineralization is within normal limits for age. Joint spaces and alignment are largely normal for age, mild hallux valgus. No acute fracture or dislocation identified. However, there is evidence of ankle joint effusion on the lateral view. Right ankle is detailed separately. IMPRESSION: 1. Evidence of ankle joint effusion. Right ankle  otherwise reported separately. 2. No acute fracture or dislocation identified in the right foot. Electronically Signed   By: Odessa Fleming M.D.   On: 02/09/2023 11:59    Procedures Procedures    Medications Ordered in ED Medications - No data to display  ED Course/ Medical Decision Making/ A&P                             Medical Decision Making Problems Addressed: Accidental fall, initial encounter: acute illness or injury with systemic symptoms that poses a threat to life or bodily functions Sprain and strain of right ankle: acute illness or injury  Amount and/or Complexity of Data Reviewed Independent Historian:     Details: Ems/family/friend, hx External Data Reviewed: notes. Radiology: ordered and independent interpretation performed. Decision-making details documented in ED Course.  Risk Prescription drug management.   Continuous pulse ox and cardiac monitoring.  Imaging ordered.   Reviewed nursing notes and prior charts for additional history. External reports reviewed. Additional history from: family/friend, ems.   Cardiac monitor: sinus rhythm, rate 66.  Xrays reviewed/interpreted by me - no fx.  Acetaminophen po, ankle brace applied, icepack.   Pt comfortable, no distress. No new pain. Pt currently appears stable for d/c.    Rec close pcp f/u.  Return precautions provided.          Final Clinical Impression(s) / ED Diagnoses Final diagnoses:  None    Rx / DC Orders ED Discharge Orders     None         Cathren Laine, MD 02/09/23 1258

## 2023-02-09 NOTE — Discharge Instructions (Signed)
It was our pleasure to provide your ER care today - we hope that you feel better.  Fall precautions - use great care and/or walker/cane when up and about to help minimize risk of falling.   Wear ankle brace as need for comfort and support for the next few days. Take acetaminophen as need.  Follow up with primary care doctor in 1-2 weeks if symptoms fail to improve/resolve.  Return to ER if worse, new symptoms, new/severe pain, weak/faint, chest pain, trouble breathing, or other emergency concern.

## 2023-02-09 NOTE — ED Triage Notes (Signed)
Pt arrived by EMS. Pt fell twice yesterday. Pt c/o of left shoulder pain and right ankle pain. Denies hitting head or LOC. Pt states her legs have been weak d/t a back surgery and this was the cause for her fall. Obvious swelling to right ankle. Pulses present and strong.

## 2023-02-11 ENCOUNTER — Encounter (HOSPITAL_COMMUNITY): Payer: Self-pay | Admitting: Emergency Medicine

## 2023-02-11 ENCOUNTER — Other Ambulatory Visit: Payer: Self-pay

## 2023-02-11 ENCOUNTER — Emergency Department (HOSPITAL_COMMUNITY)
Admission: EM | Admit: 2023-02-11 | Discharge: 2023-02-12 | Disposition: A | Discharge status: Home / Self Care | Payer: Medicare Other | Attending: Emergency Medicine | Admitting: Emergency Medicine

## 2023-02-11 ENCOUNTER — Emergency Department (HOSPITAL_COMMUNITY): Payer: Medicare Other

## 2023-02-11 DIAGNOSIS — G8929 Other chronic pain: Secondary | ICD-10-CM | POA: Diagnosis not present

## 2023-02-11 DIAGNOSIS — R1012 Left upper quadrant pain: Secondary | ICD-10-CM

## 2023-02-11 DIAGNOSIS — I10 Essential (primary) hypertension: Secondary | ICD-10-CM | POA: Insufficient documentation

## 2023-02-11 DIAGNOSIS — R109 Unspecified abdominal pain: Secondary | ICD-10-CM

## 2023-02-11 LAB — CBC WITH DIFFERENTIAL/PLATELET
Abs Immature Granulocytes: 0.01 10*3/uL (ref 0.00–0.07)
Basophils Absolute: 0.1 10*3/uL (ref 0.0–0.1)
Basophils Relative: 1 %
Eosinophils Absolute: 0.1 10*3/uL (ref 0.0–0.5)
Eosinophils Relative: 1 %
HCT: 33.4 % — ABNORMAL LOW (ref 36.0–46.0)
Hemoglobin: 10.9 g/dL — ABNORMAL LOW (ref 12.0–15.0)
Immature Granulocytes: 0 %
Lymphocytes Relative: 26 %
Lymphs Abs: 1.6 10*3/uL (ref 0.7–4.0)
MCH: 29.1 pg (ref 26.0–34.0)
MCHC: 32.6 g/dL (ref 30.0–36.0)
MCV: 89.1 fL (ref 80.0–100.0)
Monocytes Absolute: 0.4 10*3/uL (ref 0.1–1.0)
Monocytes Relative: 7 %
Neutro Abs: 3.9 10*3/uL (ref 1.7–7.7)
Neutrophils Relative %: 65 %
Platelets: 184 10*3/uL (ref 150–400)
RBC: 3.75 MIL/uL — ABNORMAL LOW (ref 3.87–5.11)
RDW: 12.3 % (ref 11.5–15.5)
WBC: 6.1 10*3/uL (ref 4.0–10.5)
nRBC: 0 % (ref 0.0–0.2)

## 2023-02-11 LAB — I-STAT CHEM 8, ED
BUN: 17 mg/dL (ref 8–23)
Calcium, Ion: 1.2 mmol/L (ref 1.15–1.40)
Chloride: 101 mmol/L (ref 98–111)
Creatinine, Ser: 1 mg/dL (ref 0.44–1.00)
Glucose, Bld: 104 mg/dL — ABNORMAL HIGH (ref 70–99)
HCT: 33 % — ABNORMAL LOW (ref 36.0–46.0)
Hemoglobin: 11.2 g/dL — ABNORMAL LOW (ref 12.0–15.0)
Potassium: 4.3 mmol/L (ref 3.5–5.1)
Sodium: 136 mmol/L (ref 135–145)
TCO2: 26 mmol/L (ref 22–32)

## 2023-02-11 LAB — COMPREHENSIVE METABOLIC PANEL
ALT: 20 U/L (ref 0–44)
AST: 30 U/L (ref 15–41)
Albumin: 3.5 g/dL (ref 3.5–5.0)
Alkaline Phosphatase: 82 U/L (ref 38–126)
Anion gap: 7 (ref 5–15)
BUN: 18 mg/dL (ref 8–23)
CO2: 25 mmol/L (ref 22–32)
Calcium: 8.6 mg/dL — ABNORMAL LOW (ref 8.9–10.3)
Chloride: 103 mmol/L (ref 98–111)
Creatinine, Ser: 0.95 mg/dL (ref 0.44–1.00)
GFR, Estimated: 57 mL/min — ABNORMAL LOW (ref >60)
Glucose, Bld: 107 mg/dL — ABNORMAL HIGH (ref 70–99)
Potassium: 4.2 mmol/L (ref 3.5–5.1)
Sodium: 135 mmol/L (ref 135–145)
Total Bilirubin: 0.6 mg/dL (ref 0.3–1.2)
Total Protein: 6.1 g/dL — ABNORMAL LOW (ref 6.5–8.1)

## 2023-02-11 LAB — LIPASE, BLOOD: Lipase: 70 U/L — ABNORMAL HIGH (ref 11–51)

## 2023-02-11 LAB — MAGNESIUM: Magnesium: 2.1 mg/dL (ref 1.7–2.4)

## 2023-02-11 LAB — TROPONIN I (HIGH SENSITIVITY): Troponin I (High Sensitivity): 4 ng/L (ref <18)

## 2023-02-11 MED ORDER — FENTANYL CITRATE PF 50 MCG/ML IJ SOSY
25.0000 ug | PREFILLED_SYRINGE | Freq: Once | INTRAMUSCULAR | Status: AC
  Administered 2023-02-11: 25 ug via INTRAVENOUS
  Filled 2023-02-11: qty 1

## 2023-02-11 MED ORDER — LACTATED RINGERS IV BOLUS
250.0000 mL | Freq: Once | INTRAVENOUS | Status: AC
  Administered 2023-02-11: 250 mL via INTRAVENOUS

## 2023-02-11 NOTE — ED Provider Notes (Signed)
[place] EMERGENCY DEPARTMENT AT [facility] Provider Note   CSN: [id number] Arrival date & time: 02/11/23  2104     History {Add pertinent medical, surgical, social history, OB history to HPI:1} Chief Complaint  Patient presents with   Flank Pain    Whitney Galloway is a 89 y.o. female.   Flank Pain Associated symptoms include abdominal pain.  Patient presents for abdominal pain.  Medical history includes HTN, GERD, gastroparesis, IBS, anemia, depression, fibromyalgia.  She was seen in the ED 2 days ago after a fall.  She underwent x-rays of right foot and ankle which were negative.  Patient reports that she has had an ongoing mild left upper quadrant abdominal pain that is chronic.  This area pain became severe this afternoon at approximately 3 PM.  She denies any associated nausea.  Her last bowel movement was today and was small but otherwise normal.  She does state that she will occasionally have dark stools.  Patient took 2 ibuprofen with minimal relief.  Pain has been persistent since this afternoon.  Patient's daughter, who accompanies her at bedside, states that patient seemed to be in her normal state of health this morning.  Daughter was unaware of chronic abdominal pain.     Home Medications Prior to Admission medications   Medication Sig Start Date End Date Taking? Authorizing Provider  acetaminophen (TYLENOL) 650 MG CR tablet Take 650 mg by mouth every 8 (eight) hours as needed for pain.    [provider]  b complex vitamins capsule Take 1 capsule by mouth daily.    [provider]  benzonatate (TESSALON) 100 MG capsule Take 1 capsule (100 mg total) by mouth every 8 (eight) hours. 10/28/22   Sharee Holster, NP  buPROPion (WELLBUTRIN XL) 150 MG 24 hr tablet Take 1 tablet (150 mg total) by mouth every morning. 10/28/22   Sharee Holster, NP  Carboxymethylcellulose Sodium (THERATEARS OP) Apply to eye 2 (two) times daily.    [provider]  dicyclomine (BENTYL) 10 MG capsule Take 1 capsule (10 mg total) by mouth daily as needed. 10/28/22   Sharee Holster, NP  metoprolol tartrate (LOPRESSOR) 25 MG tablet Take 1 tablet (25 mg total) by mouth 2 (two) times daily. 10/28/22   Sharee Holster, NP  Multiple Vitamin (MULTIVITAMIN WITH MINERALS) TABS tablet Take 1 tablet by mouth daily.    [provider]  Multiple Vitamins-Minerals (PRESERVISION AREDS PO) Take by mouth daily.    [provider]  pantoprazole (PROTONIX) 40 MG tablet Take 1 tablet (40 mg total) by mouth daily. 10/28/22   Sharee Holster, NP  UNABLE TO FIND Diet: Regular with thin liquids    [provider]  Venlafaxine HCl 225 MG TB24 Take 1 tablet (225 mg total) by mouth daily. 10/28/22   Sharee Holster, NP      Allergies    Patient has no known allergies.    Review of Systems   Review of Systems  Gastrointestinal:  Positive for abdominal pain.  All other systems reviewed and are negative.   Physical Exam Updated Vital Signs BP (!) 140/63   Pulse 69   Temp 97.7 F (36.5 C)   Resp 18   Ht 5\' 7"  (1.702 m)   Wt 65.8 kg   SpO2 95%   BMI 22.72 kg/m  Physical Exam Vitals and nursing note reviewed.  Constitutional:      General: She is not in acute  distress.    Appearance: Normal appearance. She is well-developed. She is not ill-appearing, toxic-appearing or diaphoretic.  HENT:     Head: Normocephalic and atraumatic.     Right Ear: External ear normal.     Left Ear: External ear normal.     Nose: Nose normal.     Mouth/Throat:     Mouth: Mucous membranes are moist.  Eyes:     Extraocular Movements: Extraocular movements intact.     Conjunctiva/sclera: Conjunctivae normal.  Cardiovascular:     Rate and Rhythm: Normal rate and regular rhythm.  Pulmonary:     Effort: Pulmonary effort is normal. No respiratory distress.  Abdominal:     General: There is no distension.     Palpations: Abdomen is soft.      Tenderness: There is no abdominal tenderness.  Musculoskeletal:        General: No swelling. Normal range of motion.     Cervical back: Normal range of motion and neck supple.     Right lower leg: No edema.     Left lower leg: No edema.  Skin:    General: Skin is warm and dry.     Coloration: Skin is pale. Skin is not jaundiced.  Neurological:     General: No focal deficit present.     Mental Status: She is alert and oriented to person, place, and time.  Psychiatric:        Mood and Affect: Mood normal.        Behavior: Behavior normal.        Thought Content: Thought content normal.        Judgment: Judgment normal.     ED Results / Procedures / Treatments   Labs (all labs ordered are listed, but only abnormal results are displayed) Labs Reviewed - No data to display  EKG None  Radiology No results found.  Procedures Procedures  {Document cardiac monitor, telemetry assessment procedure when appropriate:1}  Medications Ordered in ED Medications - No data to display  ED Course/ Medical Decision Making/ A&P   {   Click here for ABCD2, HEART and other calculatorsREFRESH Note before signing :1}                          Medical Decision Making  This patient presents to the ED for concern of ***, this involves an extensive number of treatment options, and is a complaint that carries with it a high risk of complications and morbidity.  The differential diagnosis includes ***   Co morbidities that complicate the patient evaluation  ***   Additional history obtained:  Additional history obtained from *** External records from outside source obtained and reviewed including ***   Lab Tests:  I Ordered, and personally interpreted labs.  The pertinent results include:  ***   Imaging Studies ordered:  I ordered imaging studies including ***  I independently visualized and interpreted imaging which showed *** I agree with the radiologist  interpretation   Cardiac Monitoring: / EKG:  The patient was maintained on a cardiac monitor.  I personally viewed and interpreted the cardiac monitored which showed an underlying rhythm of: ***   Consultations Obtained:  I requested consultation with the ***,  and discussed lab and imaging findings as well as pertinent plan - they recommend: ***   Problem List / ED Course / Critical interventions / Medication management  Patient presents for left lower quadrant abdominal pain.  Although  she states that she does have chronic discomfort to this area, pain became severe this afternoon and has been persistent since that time.  On arrival in the ED, vital signs are normal.  Patient appears uncomfortable.  She is alert and oriented.  Despite her abdominal pain, this does not appear to be worsened with palpation.  I do not appreciate any masses on palpation.  Fentanyl was ordered for analgesia.  Patient to undergo lab work and CT imaging.***. I ordered medication including ***  for ***  Reevaluation of the patient after these medicines showed that the patient {resolved/improved/worsened:23923::"improved"} I have reviewed the patients home medicines and have made adjustments as needed   Social Determinants of Health:  ***   Test / Admission - Considered:  ***   {Document critical care time when appropriate:1} {Document review of labs and clinical decision tools ie heart score, Chads2Vasc2 etc:1}  {Document your independent review of radiology images, and any outside records:1} {Document your discussion with family members, caretakers, and with consultants:1} {Document social determinants of health affecting pt's care:1} {Document your decision making why or why not admission, treatments were needed:1} Final Clinical Impression(s) / ED Diagnoses Final diagnoses:  None    Rx / DC Orders ED Discharge Orders     None

## 2023-02-11 NOTE — ED Triage Notes (Signed)
Pt c/o left flank pain since 1630 today.

## 2023-02-12 LAB — URINALYSIS, ROUTINE W REFLEX MICROSCOPIC
Bacteria, UA: NONE SEEN
Bilirubin Urine: NEGATIVE
Glucose, UA: NEGATIVE mg/dL
Hgb urine dipstick: NEGATIVE
Ketones, ur: 5 mg/dL — AB
Nitrite: NEGATIVE
Protein, ur: NEGATIVE mg/dL
Specific Gravity, Urine: 1.005 (ref 1.005–1.030)
pH: 7 (ref 5.0–8.0)

## 2023-02-12 LAB — TROPONIN I (HIGH SENSITIVITY): Troponin I (High Sensitivity): 4 ng/L (ref <18)

## 2023-02-12 MED ORDER — IOHEXOL 300 MG/ML  SOLN
100.0000 mL | Freq: Once | INTRAMUSCULAR | Status: AC | PRN
  Administered 2023-02-12: 100 mL via INTRAVENOUS

## 2023-02-12 MED ORDER — TRAMADOL HCL 50 MG PO TABS: 50.0000 mg | ORAL_TABLET | Freq: Four times a day (QID) | ORAL | 0 refills | Status: DC | PRN

## 2023-02-12 NOTE — ED Notes (Signed)
Patient transported to CT 

## 2023-02-12 NOTE — ED Provider Notes (Signed)
  Physical Exam  BP (!) 129/56   Pulse 64   Temp 97.7 F (36.5 C)   Resp 19   Ht 5\' 7"  (1.702 m)   Wt 65.8 kg   SpO2 96%   BMI 22.72 kg/m   Physical Exam Vitals and nursing note reviewed.  Constitutional:      General: She is not in acute distress.    Appearance: Normal appearance. She is not ill-appearing.  HENT:     Head: Normocephalic and atraumatic.  Pulmonary:     Effort: Pulmonary effort is normal.  Abdominal:     General: There is no distension.     Tenderness: There is no abdominal tenderness. There is left CVA tenderness.  Skin:    General: Skin is warm and dry.  Neurological:     Mental Status: She is alert and oriented to person, place, and time.     Procedures  Procedures  ED Course / MDM    Medical Decision Making Amount and/or Complexity of Data Reviewed Labs: ordered. Radiology: ordered.  Risk Prescription drug management.   Care assumed from Dr. Durwin Nora at shift change.  Patient awaiting results of a CT scan to further evaluate the source of her left flank pain.  This study has returned and shows no acute intra-abdominal pathology.  Patient has been reassessed and tells me that her pain has since resolved.  I am uncertain as to the etiology of this discomfort she is experiencing, but it may well be musculoskeletal.  I will prescribe a small quantity of tramadol and see if this helps when her pain flares up.  Patient is to return in the meantime if symptoms worsen or change.       Geoffery Lyons, MD 02/12/23 [phone]

## 2023-02-12 NOTE — Discharge Instructions (Signed)
Begin taking tramadol as prescribed as needed for pain.  Follow-up with your primary doctor if symptoms are not improving in the next week, and return to the ER if symptoms significantly worsen or change.

## 2023-02-18 ENCOUNTER — Encounter: Payer: Self-pay | Admitting: Internal Medicine

## 2023-02-18 ENCOUNTER — Ambulatory Visit (INDEPENDENT_AMBULATORY_CARE_PROVIDER_SITE_OTHER): Payer: Medicare Other | Admitting: Internal Medicine

## 2023-02-18 VITALS — BP 115/61 | HR 64 | Temp 97.4°F | Ht 68.0 in | Wt 138.2 lb

## 2023-02-18 DIAGNOSIS — K5909 Other constipation: Secondary | ICD-10-CM

## 2023-02-18 DIAGNOSIS — K219 Gastro-esophageal reflux disease without esophagitis: Secondary | ICD-10-CM | POA: Diagnosis not present

## 2023-02-18 NOTE — Patient Instructions (Signed)
It was nice to meet you today!  For your constipation I recommend the following:  Stop MiraLAX entirely  2-week trial of Linzess (72)-1 capsule daily.  Samples provided  Telephone follow-up in 2 weeks for progress report  Kiwi fruit is a good natural laxative you can also try if desired  Office visit here in 3 months

## 2023-02-19 NOTE — Progress Notes (Signed)
Primary Care Physician:  Benita Stabile, MD Primary Gastroenterologist:  Dr. Jena Gauss  Pre-Procedure History & Physical: HPI:  Whitney Galloway is a 89 y.o. female here for follow-up of GERD and constipation.  Reflux symptoms well-controlled on pantoprazole 40 mg once daily before breakfast.  No dysphagia.  Constipation continues to be a major issue;  she is accompanied by her daughter today.  They both endorse that she goes 5 to 6 days without a bowel movement without taking MiraLAX then starts on MiraLAX and has copious bowel movements.  Feels backed up when she does not go after 3 days.  Has not passed any blood  Past Medical History:  Diagnosis Date   Bowel incontinence    Colon polyp 1994 Malignant polyp   2005 NL BE; 2006 incomplete TCS DUE TO REDUNDNACY   Diverticulosis    Fibromyalgia    Gastritis    Gastroparesis 2009   65% RETENTION AT 2 HOURS   GERD (gastroesophageal reflux disease)    Hard of hearing    Hernia of unspecified site of abdominal cavity without mention of obstruction or gangrene    Hiatal hernia    HIATAL HERNIA 06/23/2008   Qualifier: Diagnosis of  By: Minna Merritts     HTN (hypertension)    IBS (irritable bowel syndrome)    Osteoporosis    S/P endoscopy 2007, 2009   2007: single distal esophageal erosion, s/p 56 F dilation, no web/ring/stricture noted; 2009: normal, Bravo with adequate suppression on Prevacid BID   Splenic lesion HAMARTOMA   TB (tuberculosis) AGE 70   Wears dentures    top   Wears glasses    Wears hearing aid     Past Surgical History:  Procedure Laterality Date   BACK SURGERY  2014   lumbar lam   Benign tumor removed from bilateral breast     cataract surgery     COCHLEAR IMPLANT Left 09/21/2014   Procedure: COCHLEAR IMPLANT LEFT EAR;  Surgeon: Carolan Shiver, MD;  Location: [facility];  Service: ENT;  Laterality: Left;   COLONOSCOPY W/ POLYPECTOMY     ESOPHAGOGASTRODUODENOSCOPY  08/27/2011   hiatal hernia/mild  gastritis   ESOPHAGOGASTRODUODENOSCOPY  05/25/2008    Normal esophagus without evidence of Barrett mass/  Normal stomach, duodenal bulb, and second portion of the duodenum   ESOPHAGOGASTRODUODENOSCOPY  08/11/2006   Single erosion at distal esophagus and a small sliding hiatal hernia/ No evidence of ring or stricture but esophagus was dilated by passing 56 [place] Maloney dilator/ Prepyloric antral erythema but no evidence of erosions or peptic ulcer disease.   ESOPHAGOGASTRODUODENOSCOPY  09/03/2005   Small sliding hiatal hernia without evidence of ring or  stricture formation. Esophagus dilated by passing 54-French Maloney dilator   given history of dysphagia   GIVENS CAPSULE STUDY  05/27/2008   Adequate acid suppression with proton pump inhibitor.   LEFT WRIST CYST REMOVAL     LUNG SURGERY for pulmonary nodules     Mass removed from roof of mouth     SAVORY DILATION  08/27/2011   stricture in the distal esophagus   UPPER GASTROINTESTINAL ENDOSCOPY  2006   X-STOP IMPLANTATION      Prior to Admission medications   Medication Sig Start Date End Date Taking? Authorizing Provider  b complex vitamins capsule Take 1 capsule by mouth daily.   Yes [provider]  buPROPion (WELLBUTRIN XL) 150 MG 24 hr tablet Take 1 tablet (150 mg total) by  mouth every morning. 10/28/22  Yes Sharee Holster, NP  Carboxymethylcellulose Sodium (THERATEARS OP) Apply to eye 2 (two) times daily.   Yes [provider]  dicyclomine (BENTYL) 10 MG capsule Take 1 capsule (10 mg total) by mouth daily as needed. 10/28/22  Yes Sharee Holster, NP  famotidine (PEPCID) 20 MG tablet Take 20 mg by mouth daily. 10/29/22  Yes [provider]  LORazepam (ATIVAN) 1 MG tablet Take 1 tablet by mouth at bedtime. 12/17/12  Yes [provider]  metoprolol tartrate (LOPRESSOR) 25 MG tablet Take 1 tablet (25 mg total) by mouth 2 (two) times daily. 10/28/22  Yes Sharee Holster, NP  Multiple Vitamin  (MULTIVITAMIN WITH MINERALS) TABS tablet Take 1 tablet by mouth daily.   Yes [provider]  Multiple Vitamins-Minerals (PRESERVISION AREDS PO) Take by mouth daily.   Yes [provider]  pantoprazole (PROTONIX) 40 MG tablet Take 1 tablet (40 mg total) by mouth daily. 10/28/22  Yes Sharee Holster, NP  polyethylene glycol powder (GLYCOLAX/MIRALAX) 17 GM/SCOOP powder Take by mouth. 11/18/19  Yes [provider]  Venlafaxine HCl 225 MG TB24 Take 1 tablet (225 mg total) by mouth daily. 10/28/22  Yes Sharee Holster, NP    Allergies as of 02/18/2023   (No Known Allergies)    Family History  Problem Relation Age of Onset   Colon cancer Neg Hx     Social History   Socioeconomic History   Marital status: Widowed    Spouse name: Not on file   Number of children: Not on file   Years of education: Not on file   Highest education level: Not on file  Occupational History   Not on file  Tobacco Use   Smoking status: Former    Types: Cigarettes    Quit date: 09/16/2004    Years since quitting: 18.4   Smokeless tobacco: Never   Tobacco comments:    Quit x 8 years  Vaping Use   Vaping Use: Never used  Substance and Sexual Activity   Alcohol use: No   Drug use: No   Sexual activity: Never  Other Topics Concern   Not on file  Social History Narrative   Not on file   Social Determinants of Health   Financial Resource Strain: Low Risk  (09/05/2020)   Overall Financial Resource Strain (CARDIA)    Difficulty of Paying Living Expenses: Not hard at all  Food Insecurity: No Food Insecurity (10/08/2022)   Hunger Vital Sign    Worried About Running Out of Food in the Last Year: Never true    Ran Out of Food in the Last Year: Never true  Transportation Needs: No Transportation Needs (10/08/2022)   PRAPARE - Administrator, Civil Service (Medical): No    Lack of Transportation (Non-Medical): No  Physical Activity: Inactive (09/05/2020)   Exercise Vital  Sign    Days of Exercise per Week: 0 days    Minutes of Exercise per Session: 0 min  Stress: Stress Concern Present (09/05/2020)   [employer] of Occupational Health - Occupational Stress Questionnaire    Feeling of Stress : To some extent  Social Connections: Moderately Isolated (09/05/2020)   Social Connection and Isolation Panel [NHANES]    Frequency of Communication with Friends and Family: Three times a week    Frequency of Social Gatherings with Friends and Family: Three times a week    Attends Religious Services: Never    Active  Member of Clubs or Organizations: Yes    Attends Banker Meetings: Never    Marital Status: Widowed  Intimate Partner Violence: Not At Risk (10/08/2022)   Humiliation, Afraid, Rape, and Kick questionnaire    Fear of Current or Ex-Partner: No    Emotionally Abused: No    Physically Abused: No    Sexually Abused: No    Review of Systems: See HPI, otherwise negative ROS  Physical Exam: BP 115/61 (BP Location: Right Arm, Patient Position: Sitting, Cuff Size: Normal)   Pulse 64   Temp (!) 97.4 F (36.3 C) (Oral)   Ht 5\' 8"  (1.727 m)   Wt 138 lb 3.2 oz (62.7 kg)   SpO2 97%   BMI 21.01 kg/m  General:   Elderly, hard of hearing, slightly disheveled 89 year old lady appears to be in no acute distress.  Accompanied by her daughter.  Pleasant and cooperative in NAD Lungs:  Clear throughout to auscultation.   No wheezes, crackles, or rhonchi. No acute distress. Heart:  Regular rate and rhythm; no murmurs, clicks, rubs,  or gallops. Abdomen: Non-distended, normal bowel sounds.  Soft and nontender without appreciable mass or hepatosplenomegaly.  Rectal: Vehemently declined by patient.  She states "it would make a mess"  Impression/Plan: Pleasant 89 year old lady with GERD now well-controlled on pantoprazole 40 mg once daily.  Major clinical issue is ongoing constipation.  She does not utilize MiraLAX correctly.  She goes several days  without a bowel movement then has multiple bowel movements.  Cycle then repeats itself.  It seems she does not like taking MiraLAX regularly even at low dose.  Recommendations  Stop MiraLAX entirely  2-week trial of Linzess (72)-1 capsule daily.  Samples provided  Telephone follow-up in 2 weeks for progress report  Kiwi fruit is a good natural laxative that can be incorporated into diet which may also be helpful.  Office visit here in 3 months   Notice: This dictation was prepared with Dragon dictation along with smaller phrase technology. Any transcriptional errors that result from this process are unintentional and may not be corrected upon review.                  Recommendations:  For your constipation I recommend the following:  Stop MiraLAX entirely  2-week trial of Linzess (72)-1 capsule daily.  Samples provided  Telephone follow-up in 2 weeks for progress report  Kiwi fruit is a good natural laxative you can also try if desired  Office visit here in 3 months

## 2023-02-25 ENCOUNTER — Ambulatory Visit (INDEPENDENT_AMBULATORY_CARE_PROVIDER_SITE_OTHER): Payer: Medicare Other | Admitting: Obstetrics & Gynecology

## 2023-02-25 ENCOUNTER — Encounter: Payer: Self-pay | Admitting: Obstetrics & Gynecology

## 2023-02-25 VITALS — BP 116/59 | HR 55

## 2023-02-25 DIAGNOSIS — N760 Acute vaginitis: Secondary | ICD-10-CM

## 2023-02-25 DIAGNOSIS — N362 Urethral caruncle: Secondary | ICD-10-CM

## 2023-02-25 DIAGNOSIS — N907 Vulvar cyst: Secondary | ICD-10-CM | POA: Diagnosis not present

## 2023-02-25 NOTE — Progress Notes (Signed)
Follow up appointment for results: Vulvar issues  Chief Complaint  Patient presents with   Follow-up    Lumps in vaginal area    Blood pressure (!) 116/59, pulse (!) 55.  I saw patient a couple of years ago for vulvovaginitis from fecal soiled pads We have used fanny cream for maintenance which has worked well thus far  She comes in today with a concern of a vaginal bump  On exam there is a pea sized sebaceous cyst of the left culva She also has a stable urethral caruncle, not bleeding Her vulvovaginitis is well controlled on the fanny cream   MEDS ordered this encounter: No orders of the defined types were placed in this encounter.   Orders for this encounter: No orders of the defined types were placed in this encounter.   Impression + Management Plan   ICD-10-CM   1. Vulvovaginitis, chronic, due to fecal soiled pads  N76.0    well controlled with Fanny cream    2. Urethral caruncle  N36.2    due to severe urethral atrophy, stable, not bleeding, no therapy is needed    3. Sebaceous cyst of left vulva  N90.7    of no clinical concern, not infected      Follow Up: Return if symptoms worsen or fail to improve.     All questions were answered.  Past Medical History:  Diagnosis Date   Bowel incontinence    Colon polyp 1994 Malignant polyp   2005 NL BE; 2006 incomplete TCS DUE TO REDUNDNACY   Diverticulosis    Fibromyalgia    Gastritis    Gastroparesis 2009   65% RETENTION AT 2 HOURS   GERD (gastroesophageal reflux disease)    Hard of hearing    Hernia of unspecified site of abdominal cavity without mention of obstruction or gangrene    Hiatal hernia    HIATAL HERNIA 06/23/2008   Qualifier: Diagnosis of  By: Minna Merritts     HTN (hypertension)    IBS (irritable bowel syndrome)    Osteoporosis    S/P endoscopy 2007, 2009   2007: single distal esophageal erosion, s/p 56 F dilation, no web/ring/stricture noted; 2009: normal, Bravo with adequate suppression  on Prevacid BID   Splenic lesion HAMARTOMA   TB (tuberculosis) AGE 40   Wears dentures    top   Wears glasses    Wears hearing aid     Past Surgical History:  Procedure Laterality Date   BACK SURGERY  2014   lumbar lam   Benign tumor removed from bilateral breast     cataract surgery     COCHLEAR IMPLANT Left 09/21/2014   Procedure: COCHLEAR IMPLANT LEFT EAR;  Surgeon: Carolan Shiver, MD;  Location: [facility];  Service: ENT;  Laterality: Left;   COLONOSCOPY W/ POLYPECTOMY     ESOPHAGOGASTRODUODENOSCOPY  08/27/2011   hiatal hernia/mild gastritis   ESOPHAGOGASTRODUODENOSCOPY  05/25/2008    Normal esophagus without evidence of Barrett mass/  Normal stomach, duodenal bulb, and second portion of the duodenum   ESOPHAGOGASTRODUODENOSCOPY  08/11/2006   Single erosion at distal esophagus and a small sliding hiatal hernia/ No evidence of ring or stricture but esophagus was dilated by passing 56 [place] Maloney dilator/ Prepyloric antral erythema but no evidence of erosions or peptic ulcer disease.   ESOPHAGOGASTRODUODENOSCOPY  09/03/2005   Small sliding hiatal hernia without evidence of ring or  stricture formation. Esophagus dilated by passing 54-French Maloney dilator   given history  of dysphagia   GIVENS CAPSULE STUDY  05/27/2008   Adequate acid suppression with proton pump inhibitor.   LEFT WRIST CYST REMOVAL     LUNG SURGERY for pulmonary nodules     Mass removed from roof of mouth     SAVORY DILATION  08/27/2011   stricture in the distal esophagus   UPPER GASTROINTESTINAL ENDOSCOPY  2006   X-STOP IMPLANTATION      OB History   No obstetric history on file.     No Known Allergies  Social History   Socioeconomic History   Marital status: Widowed    Spouse name: Not on file   Number of children: Not on file   Years of education: Not on file   Highest education level: Not on file  Occupational History   Not on file  Tobacco Use   Smoking status: Former     Types: Cigarettes    Quit date: 09/16/2004    Years since quitting: 18.4   Smokeless tobacco: Never   Tobacco comments:    Quit x 8 years  Vaping Use   Vaping Use: Never used  Substance and Sexual Activity   Alcohol use: No   Drug use: No   Sexual activity: Never  Other Topics Concern   Not on file  Social History Narrative   Not on file   Social Determinants of Health   Financial Resource Strain: Low Risk  (09/05/2020)   Overall Financial Resource Strain (CARDIA)    Difficulty of Paying Living Expenses: Not hard at all  Food Insecurity: No Food Insecurity (10/08/2022)   Hunger Vital Sign    Worried About Running Out of Food in the Last Year: Never true    Ran Out of Food in the Last Year: Never true  Transportation Needs: No Transportation Needs (10/08/2022)   PRAPARE - Administrator, Civil Service (Medical): No    Lack of Transportation (Non-Medical): No  Physical Activity: Inactive (09/05/2020)   Exercise Vital Sign    Days of Exercise per Week: 0 days    Minutes of Exercise per Session: 0 min  Stress: Stress Concern Present (09/05/2020)   [employer] of Occupational Health - Occupational Stress Questionnaire    Feeling of Stress : To some extent  Social Connections: Moderately Isolated (09/05/2020)   Social Connection and Isolation Panel [NHANES]    Frequency of Communication with Friends and Family: Three times a week    Frequency of Social Gatherings with Friends and Family: Three times a week    Attends Religious Services: Never    Active Member of Clubs or Organizations: Yes    Attends Banker Meetings: Never    Marital Status: Widowed    Family History  Problem Relation Age of Onset   Colon cancer Neg Hx

## 2023-03-04 ENCOUNTER — Telehealth: Payer: Self-pay

## 2023-03-04 MED ORDER — LUBIPROSTONE 8 MCG PO CAPS: 8.0000 ug | ORAL_CAPSULE | Freq: Two times a day (BID) | ORAL | 3 refills | Status: DC

## 2023-03-04 NOTE — Addendum Note (Signed)
Addended by: Gelene Mink on: 03/04/2023 12:40 PM   Modules accepted: Orders

## 2023-03-04 NOTE — Telephone Encounter (Signed)
Pt's daughter called stating that the pt has only had two bm's since her ov 2 weeks ago and was instructed to call with a report on how the Linzess 72 was doing. Pt states that it makes her nauseous and makes her feel week. Pt is not eating well either. Pt has only had a bowl of cereal today and did not eat that very well. Daughter is wanting to know what she should do. Please advise.

## 2023-03-04 NOTE — Telephone Encounter (Signed)
Let's provide her samples of Linzess 145 mcg daily.   I know she does not like Miralax, but she can take this to get things moving again. Take BID for now until she can get the LInzess.

## 2023-03-04 NOTE — Telephone Encounter (Signed)
We can try Amitiza, but I will have to send that in as we don't have samples. A classic side effect of Amitiza is nausea if not taken with food.  Please have her take one gelcap twice a day WITH Food. I am starting with 8 mcg. I sent to [facility] pharmacy. We can titrate this up if needed.

## 2023-03-05 NOTE — Telephone Encounter (Signed)
Pt's daughter was made aware and verbalized understanding.  

## 2023-03-31 MED ORDER — LUBIPROSTONE 24 MCG PO CAPS: 24.0000 ug | ORAL_CAPSULE | Freq: Two times a day (BID) | ORAL | 3 refills | Status: DC

## 2023-03-31 NOTE — Telephone Encounter (Signed)
If she has been taking the 8 mcg Amitiza BID, we will do Amitiza 24 mcg BID.  I have not seen patient recently (last seen by Dr. Jena Gauss). Please offer office visit.

## 2023-03-31 NOTE — Telephone Encounter (Signed)
Pt's daughter was made aware and verbalized understanding.  

## 2023-03-31 NOTE — Telephone Encounter (Signed)
Lmom for pt to return my call.  

## 2023-03-31 NOTE — Telephone Encounter (Signed)
Pt's daughter states that the [place] is not working and that the pt has been using her finger to pull her stool out due to it being so hard. Daughter asked what pt could take to get her bowels moving in the meantime. I advised daughter to have pt take a capful of miralax twice daily until soft stool then back down to once daily and that I would send a message to you for further recommendations.

## 2023-03-31 NOTE — Addendum Note (Signed)
Addended by: Gelene Mink on: 03/31/2023 03:07 PM   Modules accepted: Orders

## 2023-04-18 ENCOUNTER — Telehealth: Payer: Self-pay

## 2023-04-18 NOTE — Telephone Encounter (Signed)
Pt's daughter called stating that the lubiprostone 24 mcg is helping the pt better that the pt is going about three times a week.

## 2023-05-13 ENCOUNTER — Encounter: Payer: Self-pay | Admitting: Internal Medicine

## 2023-06-24 ENCOUNTER — Ambulatory Visit (INDEPENDENT_AMBULATORY_CARE_PROVIDER_SITE_OTHER): Payer: Medicare Other | Admitting: Internal Medicine

## 2023-06-24 ENCOUNTER — Encounter: Payer: Self-pay | Admitting: Internal Medicine

## 2023-06-24 VITALS — BP 111/63 | HR 57 | Temp 97.7°F | Ht 68.0 in | Wt 128.8 lb

## 2023-06-24 DIAGNOSIS — K219 Gastro-esophageal reflux disease without esophagitis: Secondary | ICD-10-CM

## 2023-06-24 DIAGNOSIS — R1319 Other dysphagia: Secondary | ICD-10-CM | POA: Diagnosis not present

## 2023-06-24 MED ORDER — PANTOPRAZOLE SODIUM 40 MG PO TBEC: 40.0000 mg | DELAYED_RELEASE_TABLET | Freq: Two times a day (BID) | ORAL | 11 refills | Status: DC

## 2023-06-24 NOTE — Progress Notes (Unsigned)
Primary Care Physician:  Benita Stabile, MD Primary Gastroenterologist:  Dr. Jena Gauss  Pre-Procedure History & Physical: HPI:  Whitney Galloway is a 89 y.o. female here for follow-up of constipation.  Prescribe Linzess previously felt it was too harsh.  Became nauseated on lubiprostone 24 mcg gelcaps  twice daily became nauseated;  dropped back to 8 twice daily took it after meal. Still with nausea;  some good bowel function however.  Stopped taking it.  Because of nausea.  Protonix in the evening and famotidine in the morning which is opposite of what was recommended.  Recurrent esophageal dysphagia.  History of peptic stricture dilated twice in the past last 2012.  Good results with each session.  Reported gastroparesis 65% retention at 2 hours.  2-hour study.  Frequent nocturnal reflux.  Apparently been taking Protonix at bedtime and famotidine in the morning instead of the other way around.  Patient readily admits to eating "junk" just before she goes to bed.  Past Medical History:  Diagnosis Date   Bowel incontinence    Colon polyp 1994 Malignant polyp   2005 NL BE; 2006 incomplete TCS DUE TO REDUNDNACY   Diverticulosis    Fibromyalgia    Gastritis    Gastroparesis 2009   65% RETENTION AT 2 HOURS   GERD (gastroesophageal reflux disease)    Hard of hearing    Hernia of unspecified site of abdominal cavity without mention of obstruction or gangrene    Hiatal hernia    HIATAL HERNIA 06/23/2008   Qualifier: Diagnosis of  By: Minna Merritts     HTN (hypertension)    IBS (irritable bowel syndrome)    Osteoporosis    S/P endoscopy 2007, 2009   2007: single distal esophageal erosion, s/p 56 F dilation, no web/ring/stricture noted; 2009: normal, Bravo with adequate suppression on Prevacid BID   Splenic lesion HAMARTOMA   TB (tuberculosis) AGE 25   Wears dentures    top   Wears glasses    Wears hearing aid     Past Surgical History:  Procedure Laterality Date   BACK SURGERY   2014   lumbar lam   Benign tumor removed from bilateral breast     cataract surgery     COCHLEAR IMPLANT Left 09/21/2014   Procedure: COCHLEAR IMPLANT LEFT EAR;  Surgeon: Carolan Shiver, MD;  Location: [facility];  Service: ENT;  Laterality: Left;   COLONOSCOPY W/ POLYPECTOMY     ESOPHAGOGASTRODUODENOSCOPY  08/27/2011   hiatal hernia/mild gastritis   ESOPHAGOGASTRODUODENOSCOPY  05/25/2008    Normal esophagus without evidence of Barrett mass/  Normal stomach, duodenal bulb, and second portion of the duodenum   ESOPHAGOGASTRODUODENOSCOPY  08/11/2006   Single erosion at distal esophagus and a small sliding hiatal hernia/ No evidence of ring or stricture but esophagus was dilated by passing 56 [place] Maloney dilator/ Prepyloric antral erythema but no evidence of erosions or peptic ulcer disease.   ESOPHAGOGASTRODUODENOSCOPY  09/03/2005   Small sliding hiatal hernia without evidence of ring or  stricture formation. Esophagus dilated by passing 54-French Maloney dilator   given history of dysphagia   GIVENS CAPSULE STUDY  05/27/2008   Adequate acid suppression with proton pump inhibitor.   LEFT WRIST CYST REMOVAL     LUNG SURGERY for pulmonary nodules     Mass removed from roof of mouth     SAVORY DILATION  08/27/2011   stricture in the distal esophagus   UPPER GASTROINTESTINAL ENDOSCOPY  2006  X-STOP IMPLANTATION      Prior to Admission medications   Medication Sig Start Date End Date Taking? Authorizing Provider  amlodipine-atorvastatin (CADUET) 10-20 MG tablet Take 1 tablet by mouth daily. 05/22/23  Yes [provider]  b complex vitamins capsule Take 1 capsule by mouth daily.   Yes [provider]  buPROPion (WELLBUTRIN XL) 150 MG 24 hr tablet Take 1 tablet (150 mg total) by mouth every morning. 10/28/22  Yes Sharee Holster, NP  Carboxymethylcellulose Sodium (THERATEARS OP) Apply to eye 2 (two) times daily.   Yes [provider]  dicyclomine  (BENTYL) 10 MG capsule Take 1 capsule (10 mg total) by mouth daily as needed. 10/28/22  Yes Sharee Holster, NP  famotidine (PEPCID) 20 MG tablet Take 20 mg by mouth daily. 10/29/22  Yes [provider]  LORazepam (ATIVAN) 1 MG tablet Take 1 tablet by mouth at bedtime. 12/17/12  Yes [provider]  metoprolol tartrate (LOPRESSOR) 25 MG tablet Take 1 tablet (25 mg total) by mouth 2 (two) times daily. 10/28/22  Yes Sharee Holster, NP  Multiple Vitamin (MULTIVITAMIN WITH MINERALS) TABS tablet Take 1 tablet by mouth daily.   Yes [provider]  Multiple Vitamins-Minerals (PRESERVISION AREDS PO) Take by mouth daily.   Yes [provider]  pantoprazole (PROTONIX) 40 MG tablet Take 1 tablet (40 mg total) by mouth daily. 10/28/22  Yes Sharee Holster, NP  Venlafaxine HCl 225 MG TB24 Take 1 tablet (225 mg total) by mouth daily. 10/28/22  Yes Sharee Holster, NP  lubiprostone (AMITIZA) 8 MCG capsule Take 1 capsule (8 mcg total) by mouth 2 (two) times daily with a meal. Patient not taking: Reported on 06/24/2023 03/04/23   Gelene Mink, NP  polyethylene glycol powder (GLYCOLAX/MIRALAX) 17 GM/SCOOP powder Take by mouth. Patient not taking: Reported on 06/24/2023 11/18/19   [provider]    Allergies as of 06/24/2023   (No Known Allergies)    Family History  Problem Relation Age of Onset   Colon cancer Neg Hx     Social History   Socioeconomic History   Marital status: Widowed    Spouse name: Not on file   Number of children: Not on file   Years of education: Not on file   Highest education level: Not on file  Occupational History   Not on file  Tobacco Use   Smoking status: Former    Current packs/day: 0.00    Types: Cigarettes    Quit date: 09/16/2004    Years since quitting: 18.7   Smokeless tobacco: Never   Tobacco comments:    Quit x 8 years  Vaping Use   Vaping status: Never Used  Substance and Sexual Activity   Alcohol use: No    Drug use: No   Sexual activity: Never  Other Topics Concern   Not on file  Social History Narrative   Not on file   Social Determinants of Health   Financial Resource Strain: Low Risk  (09/05/2020)   Overall Financial Resource Strain (CARDIA)    Difficulty of Paying Living Expenses: Not hard at all  Food Insecurity: No Food Insecurity (10/08/2022)   Hunger Vital Sign    Worried About Running Out of Food in the Last Year: Never true    Ran Out of Food in the Last Year: Never true  Transportation Needs: No Transportation Needs (10/08/2022)   PRAPARE - Administrator, Civil Service (Medical): No  Lack of Transportation (Non-Medical): No  Physical Activity: Inactive (09/05/2020)   Exercise Vital Sign    Days of Exercise per Week: 0 days    Minutes of Exercise per Session: 0 min  Stress: Stress Concern Present (09/05/2020)   [employer] of Occupational Health - Occupational Stress Questionnaire    Feeling of Stress : To some extent  Social Connections: Moderately Isolated (09/05/2020)   Social Connection and Isolation Panel [NHANES]    Frequency of Communication with Friends and Family: Three times a week    Frequency of Social Gatherings with Friends and Family: Three times a week    Attends Religious Services: Never    Active Member of Clubs or Organizations: Yes    Attends Banker Meetings: Never    Marital Status: Widowed  Intimate Partner Violence: Not At Risk (10/08/2022)   Humiliation, Afraid, Rape, and Kick questionnaire    Fear of Current or Ex-Partner: No    Emotionally Abused: No    Physically Abused: No    Sexually Abused: No    Review of Systems: See HPI, otherwise negative ROS  Physical Exam: BP 111/63 (BP Location: Right Arm, Patient Position: Sitting, Cuff Size: Normal)   Pulse (!) 57   Temp 97.7 F (36.5 C) (Oral)   Ht 5\' 8"  (1.727 m)   Wt 128 lb 12.8 oz (58.4 kg)   SpO2 96%   BMI 19.58 kg/m  General:   Alert, elderly, spry  lady accompanied by her daughter.  Hard of hearing.  Mentally sharp.  Lungs:  Clear throughout to auscultation.   No wheezes, crackles, or rhonchi. No acute distress. Heart:  Regular rate and rhythm; no murmurs, clicks, rubs,  or gallops. Abdomen: Non-distended, normal bowel sounds.  Soft and nontender without appreciable mass or hepatosplenomegaly.  Rectal exam: No external lesions.  Moderately good sphincter tone.  Rectal vault is empty.  No stool.  No mass.  Mucus Hemoccult negative   Impression/Plan:    89 year old lady with chronic constipation and GERD  We have had challenges managing her constipation and finding the optimal dose of medication.  Lubiprostone seems to work but nausea not tolerated well.  If she would take the medication during a meal she will likely do much better.   reflux regimen also not being  administered properly.    History of benign peptic stricture responded to dilations previously now with recurrent symptoms.  Recommendations:  We will continue Amitiza 8 mcg gelcap twice daily as follows:  Take each dose during a meal.   emphasized and explained during a meal  Use MiraLAX only if no bowel movement in 2 days.   because of recurrent dysphagia, we will proceed with a diagnostic EGD with possible esophageal dilation in the near future.  ASA 3. The risks, benefits, limitations, alternatives and imponderables have been reviewed with the patient. Potential for esophageal dilation, biopsy, etc. have also been reviewed.  Questions have been answered. All parties agreeable.    Stop famotidine; take Protonix 40 mg twice daily 30 minutes before breakfast and supper.  New prescription dispense 60 with 11 refills  Further recommendations to follow.       Notice: This dictation was prepared with Dragon dictation along with smaller phrase technology. Any transcriptional errors that result from this process are unintentional and may not be corrected upon review.

## 2023-06-24 NOTE — Patient Instructions (Signed)
Good to see you again today  As discussed we will continue Amitiza 8 mcg gelcap twice daily as follows:  Take each dose during a meal.  In other words, when you are eating and you have emptied your plate about halfway, stop and take your Amitiza and then just finished eating.  Use MiraLAX only if no bowel movement in 2 days.  Because you have difficulty with things catching in your throat, we will schedule a repeat EGD with esophageal dilation (dysphagia) ASA 3 (clear liquid supper night before procedure)  Stop famotidine; take Protonix 40 mg twice daily 30 minutes before breakfast and supper.  New prescription dispense 60 with 11 refills  Further recommendations to follow.

## 2023-06-25 ENCOUNTER — Other Ambulatory Visit: Payer: Self-pay

## 2023-06-25 ENCOUNTER — Telehealth: Payer: Self-pay | Admitting: *Deleted

## 2023-06-25 MED ORDER — LUBIPROSTONE 8 MCG PO CAPS: 8.0000 ug | ORAL_CAPSULE | Freq: Two times a day (BID) | ORAL | 3 refills | Status: DC

## 2023-06-25 NOTE — Telephone Encounter (Signed)
LMOVM to call back to schedule EGD/ED, ASA 3 Dr. Jena Gauss

## 2023-06-26 ENCOUNTER — Encounter: Payer: Self-pay | Admitting: *Deleted

## 2023-06-26 NOTE — Telephone Encounter (Signed)
Spoke with pt daughter. Scheduled for 10/23. Aware will inform of pre-op appt. Instructions sent.

## 2023-07-29 ENCOUNTER — Other Ambulatory Visit: Payer: Self-pay

## 2023-07-29 ENCOUNTER — Encounter (HOSPITAL_COMMUNITY): Payer: Self-pay

## 2023-07-29 ENCOUNTER — Encounter (HOSPITAL_COMMUNITY)
Admission: RE | Admit: 2023-07-29 | Discharge: 2023-07-29 | Disposition: A | Discharge status: Home / Self Care | Payer: Medicare Other | Source: Ambulatory Visit | Attending: Internal Medicine | Admitting: Internal Medicine

## 2023-07-30 ENCOUNTER — Ambulatory Visit (HOSPITAL_COMMUNITY): Payer: Medicare Other | Admitting: Certified Registered Nurse Anesthetist

## 2023-07-30 ENCOUNTER — Encounter (HOSPITAL_COMMUNITY)
Admission: RE | Disposition: A | Discharge status: Home / Self Care | Payer: Self-pay | Source: Home / Self Care | Attending: Internal Medicine

## 2023-07-30 ENCOUNTER — Ambulatory Visit (HOSPITAL_BASED_OUTPATIENT_CLINIC_OR_DEPARTMENT_OTHER): Payer: Medicare Other | Admitting: Certified Registered Nurse Anesthetist

## 2023-07-30 ENCOUNTER — Encounter (HOSPITAL_COMMUNITY): Payer: Self-pay | Admitting: Internal Medicine

## 2023-07-30 ENCOUNTER — Ambulatory Visit (HOSPITAL_COMMUNITY)
Admission: RE | Admit: 2023-07-30 | Discharge: 2023-07-30 | Disposition: A | Discharge status: Home / Self Care | Payer: Medicare Other | Attending: Internal Medicine | Admitting: Internal Medicine

## 2023-07-30 DIAGNOSIS — I10 Essential (primary) hypertension: Secondary | ICD-10-CM | POA: Diagnosis not present

## 2023-07-30 DIAGNOSIS — K449 Diaphragmatic hernia without obstruction or gangrene: Secondary | ICD-10-CM | POA: Insufficient documentation

## 2023-07-30 DIAGNOSIS — M797 Fibromyalgia: Secondary | ICD-10-CM | POA: Insufficient documentation

## 2023-07-30 DIAGNOSIS — Z87891 Personal history of nicotine dependence: Secondary | ICD-10-CM | POA: Insufficient documentation

## 2023-07-30 DIAGNOSIS — R1310 Dysphagia, unspecified: Secondary | ICD-10-CM

## 2023-07-30 DIAGNOSIS — K219 Gastro-esophageal reflux disease without esophagitis: Secondary | ICD-10-CM | POA: Insufficient documentation

## 2023-07-30 DIAGNOSIS — Z79899 Other long term (current) drug therapy: Secondary | ICD-10-CM | POA: Insufficient documentation

## 2023-07-30 DIAGNOSIS — Z8611 Personal history of tuberculosis: Secondary | ICD-10-CM | POA: Insufficient documentation

## 2023-07-30 DIAGNOSIS — K317 Polyp of stomach and duodenum: Secondary | ICD-10-CM | POA: Insufficient documentation

## 2023-07-30 DIAGNOSIS — F32A Depression, unspecified: Secondary | ICD-10-CM | POA: Insufficient documentation

## 2023-07-30 DIAGNOSIS — K222 Esophageal obstruction: Secondary | ICD-10-CM | POA: Diagnosis not present

## 2023-07-30 HISTORY — PX: MALONEY DILATION: SHX5535

## 2023-07-30 HISTORY — PX: POLYPECTOMY: SHX5525

## 2023-07-30 HISTORY — PX: ESOPHAGOGASTRODUODENOSCOPY (EGD) WITH PROPOFOL: SHX5813

## 2023-07-30 SURGERY — ESOPHAGOGASTRODUODENOSCOPY (EGD) WITH PROPOFOL
Anesthesia: General

## 2023-07-30 MED ORDER — PROPOFOL 10 MG/ML IV BOLUS
INTRAVENOUS | Status: DC | PRN
  Administered 2023-07-30: 50 mg via INTRAVENOUS

## 2023-07-30 MED ORDER — LACTATED RINGERS IV SOLN: INTRAVENOUS | Status: DC | PRN

## 2023-07-30 MED ORDER — LIDOCAINE HCL (PF) 2 % IJ SOLN
INTRAMUSCULAR | Status: DC | PRN
  Administered 2023-07-30: 30 mg via INTRADERMAL

## 2023-07-30 MED ORDER — PROPOFOL 500 MG/50ML IV EMUL
INTRAVENOUS | Status: DC | PRN
  Administered 2023-07-30: 75 ug/kg/min via INTRAVENOUS

## 2023-07-30 MED ORDER — PROPOFOL 500 MG/50ML IV EMUL
INTRAVENOUS | Status: AC
  Filled 2023-07-30: qty 50

## 2023-07-30 NOTE — Anesthesia Preprocedure Evaluation (Signed)
Anesthesia Evaluation  Patient identified by MRN, date of birth, ID band Patient awake    Reviewed: Allergy & Precautions, H&P , NPO status , Patient's Chart, lab work & pertinent test results, reviewed documented beta blocker date and time   Airway Mallampati: II  TM Distance: >3 FB Neck ROM: Full    Dental no notable dental hx. (+) Upper Dentures, Partial Lower, Dental Advisory Given   Pulmonary former smoker TB history   Pulmonary exam normal breath sounds clear to auscultation       Cardiovascular hypertension, Pt. on medications and Pt. on home beta blockers  Rhythm:Regular Rate:Normal     Neuro/Psych    Depression     Neuromuscular disease    GI/Hepatic Neg liver ROS, hiatal hernia,GERD  Medicated and Controlled,,  Endo/Other  negative endocrine ROS    Renal/GU negative Renal ROS  negative genitourinary   Musculoskeletal  (+) Arthritis , Osteoarthritis,  Fibromyalgia -  Abdominal   Peds  Hematology  (+) Blood dyscrasia, anemia   Anesthesia Other Findings   Reproductive/Obstetrics negative OB ROS                             Anesthesia Physical Anesthesia Plan  ASA: 3  Anesthesia Plan: General   Post-op Pain Management: Minimal or no pain anticipated   Induction: Intravenous  PONV Risk Score and Plan:   Airway Management Planned: Natural Airway and Nasal Cannula  Additional Equipment: None  Intra-op Plan:   Post-operative Plan:   Informed Consent: I have reviewed the patients History and Physical, chart, labs and discussed the procedure including the risks, benefits and alternatives for the proposed anesthesia with the patient or authorized representative who has indicated his/her understanding and acceptance.     Dental advisory given  Plan Discussed with: CRNA  Anesthesia Plan Comments:         Anesthesia Quick Evaluation

## 2023-07-30 NOTE — Op Note (Signed)
[facility] Patient Name: Whitney Galloway Procedure Date: 07/30/2023 12:55 PM MRN: [id number] Date of Birth: September 30, 1929 Attending MD: Gennette Pac , MD, [id number] CSN: [id number] Age: 89 Admit Type: Outpatient Procedure:                Upper GI endoscopy Indications:              Dysphagia Providers:                Gennette Pac, MD, Sheran Fava,                            Zena Amos Referring MD:              Medicines:                Propofol per Anesthesia Complications:            No immediate complications. Estimated Blood Loss:     Estimated blood loss was minimal. Procedure:                Pre-Anesthesia Assessment:                           - Prior to the procedure, a History and Physical                            was performed, and patient medications and                            allergies were reviewed. The patient's tolerance of                            previous anesthesia was also reviewed. The risks                            and benefits of the procedure and the sedation                            options and risks were discussed with the patient.                            All questions were answered, and informed consent                            was obtained. Prior Anticoagulants: The patient has                            taken no anticoagulant or antiplatelet agents. ASA                            Grade Assessment: III - A patient with severe                            systemic disease. After reviewing the risks and  benefits, the patient was deemed in satisfactory                            condition to undergo the procedure.                           After obtaining informed consent, the endoscope was                            passed under direct vision. Throughout the                            procedure, the patient's blood pressure, pulse, and                            oxygen saturations were  monitored continuously. The                            [id number]-[id number] ([id number]) scope was introduced through the                            mouth, and advanced to the second part of duodenum.                            The upper GI endoscopy was accomplished without                            difficulty. The patient tolerated the procedure                            well. Scope In: 1:06:34 PM Scope Out: 1:12:59 PM Total Procedure Duration: 0 hours 6 minutes 25 seconds  Findings:      A mild Schatzki ring was found at the gastroesophageal junction.      A small hiatal hernia was present. Gastric cavity empty.       Normal-appearing gastric mucosa aside from scattered 1 to 3 mm fundic       gland appearing polyps. No ulcer or infiltrating process. Patent patent       pylorus.      The duodenal bulb and second portion of the duodenum were normal. The       scope was withdrawn. Dilation was performed with a Maloney dilator with       no resistance at 54 Fr. The scope was withdrawn. Dilation was performed       with a Maloney dilator with mild resistance at 56 Fr. The dilation site       was examined following endoscope reinsertion and showed no change.       Estimated blood loss: none. Finally, cold biopsy removal of one of the       gastric polyps performed. This was done without difficulty and without       apparent complication. Impression:               - Mild Schatzki ring. Dilated.                           -  Small hiatal hernia.                           - Normal duodenal bulb and second portion of the                            duodenum.                           - No specimens collected. Moderate Sedation:      Moderate (conscious) sedation was personally administered by an       anesthesia professional. The following parameters were monitored: oxygen       saturation, heart rate, blood pressure, respiratory rate, EKG, adequacy       of pulmonary ventilation, and response to  care. Recommendation:           - Patient has a contact number available for                            emergencies. The signs and symptoms of potential                            delayed complications were discussed with the                            patient. Return to normal activities tomorrow.                            Written discharge instructions were provided to the                            patient.                           - Advance diet as tolerated.                           - Continue present medications. However, stop                            dicyclomine, MiraLAX and Amitiza (recent nausea).                            Begin Trulance 3 mg daily for constipation.                           - Return to my office in 6 weeks. Procedure Code(s):        --- Professional ---                           [phone], Esophagogastroduodenoscopy, flexible,                            transoral; diagnostic, including collection of                            specimen(s)  by brushing or washing, when performed                            (separate procedure)                           43450, Dilation of esophagus, by unguided sound or                            bougie, single or multiple passes Diagnosis Code(s):        --- Professional ---                           K22.2, Esophageal obstruction                           K44.9, Diaphragmatic hernia without obstruction or                            gangrene                           R13.10, Dysphagia, unspecified CPT copyright 2022 American Medical Association. All rights reserved. The codes documented in this report are preliminary and upon coder review may  be revised to meet current compliance requirements. Gerrit Friends. Anil Havard, MD Gennette Pac, MD 07/30/2023 1:38:13 PM This report has been signed electronically. Number of Addenda: 0

## 2023-07-30 NOTE — H&P (Signed)
@LOGO @   Primary Care Physician:  Benita Stabile, MD Primary Gastroenterologist:  Dr.   Pre-Procedure History & Physical: HPI:  Whitney Galloway is a 89 y.o. female here for  dysphagia evaluation via EGD with possible esophageal dilation.  Past Medical History:  Diagnosis Date   Bowel incontinence    Colon polyp 1994 Malignant polyp   2005 NL BE; 2006 incomplete TCS DUE TO REDUNDNACY   Diverticulosis    Fibromyalgia    Gastritis    Gastroparesis 2009   65% RETENTION AT 2 HOURS   GERD (gastroesophageal reflux disease)    Hard of hearing    Hernia of unspecified site of abdominal cavity without mention of obstruction or gangrene    Hiatal hernia    HIATAL HERNIA 06/23/2008   Qualifier: Diagnosis of  By: Minna Merritts     HTN (hypertension)    IBS (irritable bowel syndrome)    Osteoporosis    S/P endoscopy 2007, 2009   2007: single distal esophageal erosion, s/p 56 F dilation, no web/ring/stricture noted; 2009: normal, Bravo with adequate suppression on Prevacid BID   Splenic lesion HAMARTOMA   TB (tuberculosis) AGE 34   Wears dentures    top   Wears glasses    Wears hearing aid     Past Surgical History:  Procedure Laterality Date   BACK SURGERY  2014   lumbar lam   Benign tumor removed from bilateral breast     cataract surgery     COCHLEAR IMPLANT Left 09/21/2014   Procedure: COCHLEAR IMPLANT LEFT EAR;  Surgeon: Carolan Shiver, MD;  Location: [facility];  Service: ENT;  Laterality: Left;   COLONOSCOPY W/ POLYPECTOMY     ESOPHAGOGASTRODUODENOSCOPY  08/27/2011   hiatal hernia/mild gastritis   ESOPHAGOGASTRODUODENOSCOPY  05/25/2008    Normal esophagus without evidence of Barrett mass/  Normal stomach, duodenal bulb, and second portion of the duodenum   ESOPHAGOGASTRODUODENOSCOPY  08/11/2006   Single erosion at distal esophagus and a small sliding hiatal hernia/ No evidence of ring or stricture but esophagus was dilated by passing 56 [place] Maloney  dilator/ Prepyloric antral erythema but no evidence of erosions or peptic ulcer disease.   ESOPHAGOGASTRODUODENOSCOPY  09/03/2005   Small sliding hiatal hernia without evidence of ring or  stricture formation. Esophagus dilated by passing 54-French Maloney dilator   given history of dysphagia   GIVENS CAPSULE STUDY  05/27/2008   Adequate acid suppression with proton pump inhibitor.   LEFT WRIST CYST REMOVAL     LUNG SURGERY for pulmonary nodules     Mass removed from roof of mouth     SAVORY DILATION  08/27/2011   stricture in the distal esophagus   UPPER GASTROINTESTINAL ENDOSCOPY  2006   X-STOP IMPLANTATION      Prior to Admission medications   Medication Sig Start Date End Date Taking? Authorizing Provider  amlodipine-atorvastatin (CADUET) 10-20 MG tablet Take 1 tablet by mouth daily. 05/22/23  Yes [provider]  b complex vitamins capsule Take 1 capsule by mouth daily.   Yes [provider]  buPROPion (WELLBUTRIN XL) 150 MG 24 hr tablet Take 1 tablet (150 mg total) by mouth every morning. 10/28/22  Yes Sharee Holster, NP  Carboxymethylcellulose Sodium (THERATEARS OP) Apply to eye 2 (two) times daily.   Yes [provider]  dicyclomine (BENTYL) 10 MG capsule Take 1 capsule (10 mg total) by mouth daily as needed. 10/28/22  Yes Sharee Holster, NP  famotidine (PEPCID) 20 MG tablet Take 20 mg by mouth daily. 10/29/22  Yes [provider]  LORazepam (ATIVAN) 1 MG tablet Take 1 tablet by mouth at bedtime. 12/17/12  Yes [provider]  lubiprostone (AMITIZA) 8 MCG capsule Take 1 capsule (8 mcg total) by mouth 2 (two) times daily with a meal. 06/25/23  Yes Leyland Kenna, Gerrit Friends, MD  metoprolol tartrate (LOPRESSOR) 25 MG tablet Take 1 tablet (25 mg total) by mouth 2 (two) times daily. 10/28/22  Yes Sharee Holster, NP  Multiple Vitamin (MULTIVITAMIN WITH MINERALS) TABS tablet Take 1 tablet by mouth daily.   Yes [provider]  Multiple  Vitamins-Minerals (PRESERVISION AREDS PO) Take by mouth daily.   Yes [provider]  pantoprazole (PROTONIX) 40 MG tablet Take 1 tablet (40 mg total) by mouth 2 (two) times daily before a meal. 06/24/23  Yes Anthonny Schiller, Gerrit Friends, MD  Venlafaxine HCl 225 MG TB24 Take 1 tablet (225 mg total) by mouth daily. 10/28/22  Yes Sharee Holster, NP  polyethylene glycol powder (GLYCOLAX/MIRALAX) 17 GM/SCOOP powder Take by mouth. Patient not taking: Reported on 06/24/2023 11/18/19   [provider]    Allergies as of 06/26/2023   (No Known Allergies)    Family History  Problem Relation Age of Onset   Colon cancer Neg Hx     Social History   Socioeconomic History   Marital status: Widowed    Spouse name: Not on file   Number of children: Not on file   Years of education: Not on file   Highest education level: Not on file  Occupational History   Not on file  Tobacco Use   Smoking status: Former    Current packs/day: 0.00    Types: Cigarettes    Quit date: 09/16/2004    Years since quitting: 18.8   Smokeless tobacco: Never   Tobacco comments:    Quit x 8 years  Vaping Use   Vaping status: Never Used  Substance and Sexual Activity   Alcohol use: No   Drug use: No   Sexual activity: Never  Other Topics Concern   Not on file  Social History Narrative   Not on file   Social Determinants of Health   Financial Resource Strain: Low Risk  (09/05/2020)   Overall Financial Resource Strain (CARDIA)    Difficulty of Paying Living Expenses: Not hard at all  Food Insecurity: No Food Insecurity (10/08/2022)   Hunger Vital Sign    Worried About Running Out of Food in the Last Year: Never true    Ran Out of Food in the Last Year: Never true  Transportation Needs: No Transportation Needs (10/08/2022)   PRAPARE - Administrator, Civil Service (Medical): No    Lack of Transportation (Non-Medical): No  Physical Activity: Inactive (09/05/2020)   Exercise Vital Sign    Days  of Exercise per Week: 0 days    Minutes of Exercise per Session: 0 min  Stress: Stress Concern Present (09/05/2020)   [employer] of Occupational Health - Occupational Stress Questionnaire    Feeling of Stress : To some extent  Social Connections: Moderately Isolated (09/05/2020)   Social Connection and Isolation Panel [NHANES]    Frequency of Communication with Friends and Family: Three times a week    Frequency of Social Gatherings with Friends and Family: Three times a week    Attends Religious Services: Never    Active Member of Clubs or Organizations: Yes  Attends Banker Meetings: Never    Marital Status: Widowed  Intimate Partner Violence: Not At Risk (10/08/2022)   Humiliation, Afraid, Rape, and Kick questionnaire    Fear of Current or Ex-Partner: No    Emotionally Abused: No    Physically Abused: No    Sexually Abused: No    Review of Systems: See HPI, otherwise negative ROS  Physical Exam: BP (!) 125/59 (BP Location: Right Arm)   Pulse (!) 54   Temp 98 F (36.7 C)   Resp 20   Ht 5\' 8"  (1.727 m)   Wt 63 kg   SpO2 97%   BMI 21.12 kg/m  General:   Alert,  Well-developed, well-nourished, pleasant and cooperative in NAD SLungs:  Clear throughout to auscultation.   No wheezes, crackles, or rhonchi. No acute distress. Heart:  Regular rate and rhythm; no murmurs, clicks, rubs,  or gallops. Abdomen: Non-distended, normal bowel sounds.  Soft and nontender without appreciable mass or hepatosplenomegaly.    Impression/Plan:    89 year old with dysphagia here for EGD with esophageal dilation is feasible/appropriate.  History of peptic stricture. The risks, benefits, limitations, alternatives and imponderables have been reviewed with the patient. Potential for esophageal dilation, biopsy, etc. have also been reviewed.  Questions have been answered. All parties agreeable.      Notice: This dictation was prepared with Dragon dictation along with smaller  phrase technology. Any transcriptional errors that result from this process are unintentional and may not be corrected upon review.

## 2023-07-30 NOTE — Anesthesia Postprocedure Evaluation (Signed)
Anesthesia Post Note  Patient: Whitney Galloway [facility]  Procedure(s) Performed: ESOPHAGOGASTRODUODENOSCOPY (EGD) WITH PROPOFOL MALONEY DILATION POLYPECTOMY  Patient location during evaluation: PACU Anesthesia Type: General Level of consciousness: awake and alert Pain management: pain level controlled Vital Signs Assessment: post-procedure vital signs reviewed and stable Respiratory status: spontaneous breathing, nonlabored ventilation, respiratory function stable and patient connected to nasal cannula oxygen Cardiovascular status: blood pressure returned to baseline and stable Postop Assessment: no apparent nausea or vomiting Anesthetic complications: no   There were no known notable events for this encounter.   Last Vitals:  Vitals:   07/30/23 1041 07/30/23 1318  BP: (!) 125/59 (!) 111/49  Pulse: (!) 54 (!) 58  Resp: 20 16  Temp: 36.7 C 36.6 C  SpO2: 97% 98%    Last Pain:  Vitals:   07/30/23 1327  PainSc: 0-No pain                 Enio Hornback L Caileb Rhue

## 2023-07-30 NOTE — Transfer of Care (Signed)
Immediate Anesthesia Transfer of Care Note  Patient: Whitney Galloway [facility]  Procedure(s) Performed: ESOPHAGOGASTRODUODENOSCOPY (EGD) WITH PROPOFOL MALONEY DILATION POLYPECTOMY  Patient Location: Short Stay  Anesthesia Type:General  Level of Consciousness: awake, alert , and oriented  Airway & Oxygen Therapy: Patient Spontanous Breathing  Post-op Assessment: Report given to RN, Post -op Vital signs reviewed and stable, Patient moving all extremities X 4, and Patient able to stick tongue midline  Post vital signs: Reviewed  Last Vitals:  Vitals Value Taken Time  BP 111/49   Temp 97.8   Pulse 58   Resp 16   SpO2 98     Last Pain:  Vitals:   07/30/23 1301  PainSc: 0-No pain         Complications: No notable events documented.

## 2023-07-30 NOTE — Discharge Instructions (Addendum)
EGD Discharge instructions Please read the instructions outlined below and refer to this sheet in the next few weeks. These discharge instructions provide you with general information on caring for yourself after you leave the hospital. Your doctor may also give you specific instructions. While your treatment has been planned according to the most current medical practices available, unavoidable complications occasionally occur. If you have any problems or questions after discharge, please call your doctor. ACTIVITY You may resume your regular activity but move at a slower pace for the next 24 hours.  Take frequent rest periods for the next 24 hours.  Walking will help expel (get rid of) the air and reduce the bloated feeling in your abdomen.  No driving for 24 hours (because of the anesthesia (medicine) used during the test).  You may shower.  Do not sign any important legal documents or operate any machinery for 24 hours (because of the anesthesia used during the test).  NUTRITION Drink plenty of fluids.  You may resume your normal diet.  Begin with a light meal and progress to your normal diet.  Avoid alcoholic beverages for 24 hours or as instructed by your caregiver.  MEDICATIONS You may resume your normal medications unless your caregiver tells you otherwise.  WHAT YOU CAN EXPECT TODAY You may experience abdominal discomfort such as a feeling of fullness or "gas" pains.  FOLLOW-UP Your doctor will discuss the results of your test with you.  SEEK IMMEDIATE MEDICAL ATTENTION IF ANY OF THE FOLLOWING OCCUR: Excessive nausea (feeling sick to your stomach) and/or vomiting.  Severe abdominal pain and distention (swelling).  Trouble swallowing.  Temperature over 101 F (37.8 C).  Rectal bleeding or vomiting of blood.      Esophagus dilated today.  1 small stomach polyp removed.   Stop dicyclomine  Stop MiraLAX  Stop Amitiza/lubiprostone   begin Trulance 3 mg tablet.  Take 1 daily  for good bowel function. with 11 refills.  Take samples first for 2 weeks  and see how you like this medication.  go to my office   When you leave the hospital.  Samples are waiting on you there.   continue Protonix 40 mg twice daily   office visit with me or APP in 6 weeks   at patient request, called Pam at [phone] rolled to voicemail.  Left a detailed message.

## 2023-07-31 LAB — SURGICAL PATHOLOGY

## 2023-08-03 ENCOUNTER — Encounter: Payer: Self-pay | Admitting: Internal Medicine

## 2023-08-06 ENCOUNTER — Encounter (HOSPITAL_COMMUNITY): Payer: Self-pay | Admitting: Internal Medicine

## 2023-08-20 ENCOUNTER — Other Ambulatory Visit: Payer: Self-pay

## 2023-08-20 MED ORDER — TRULANCE 3 MG PO TABS: 3.0000 mg | ORAL_TABLET | Freq: Every day | ORAL | 11 refills | Status: DC

## 2023-08-21 ENCOUNTER — Other Ambulatory Visit: Payer: Self-pay

## 2023-08-21 MED ORDER — TRULANCE 3 MG PO TABS: 3.0000 mg | ORAL_TABLET | Freq: Every day | ORAL | 11 refills | Status: AC

## 2023-09-11 ENCOUNTER — Telehealth (INDEPENDENT_AMBULATORY_CARE_PROVIDER_SITE_OTHER): Payer: Self-pay | Admitting: *Deleted

## 2023-09-11 NOTE — Telephone Encounter (Signed)
Patient daughter has left a message on Tammy Clifton's phone 12/4 that she needs her medication for nausea called in to [facility] could not remember name  Dr. Jena Gauss gave her this because her meds for constipation makes her nauseated.  Last note states --Impression/Plan:    89 year old lady with chronic constipation and GERD   We have had challenges managing her constipation and finding the optimal dose of medication.  Lubiprostone seems to work but nausea not tolerated well.  If she would take the medication during a meal she will likely do much better.

## 2023-09-12 NOTE — Telephone Encounter (Signed)
Dr Jena Gauss,  I spoke with the pt's daughter Rocco Serene @ [phone]). She states you had gave her mother (the pt) Zofran 4mg  but I do not see it in the chart. Pt is very nauseous and is in need of something. Please advise

## 2023-09-22 NOTE — Telephone Encounter (Signed)
Pt is still having nausea. Would you like something for nausea to be sent in for the pt? Please advise.

## 2023-09-25 ENCOUNTER — Other Ambulatory Visit: Payer: Self-pay

## 2023-09-25 MED ORDER — ONDANSETRON 4 MG PO TBDP: 4.0000 mg | ORAL_TABLET | Freq: Three times a day (TID) | ORAL | 3 refills | Status: DC | PRN

## 2023-09-25 NOTE — Telephone Encounter (Signed)
Lmom for return call.  

## 2023-09-25 NOTE — Telephone Encounter (Signed)
Daughter was made aware and verbalized understanding.

## 2023-09-26 ENCOUNTER — Ambulatory Visit: Payer: Medicare Other | Admitting: Internal Medicine

## 2023-09-26 VITALS — BP 114/69 | HR 62 | Temp 97.2°F | Ht 68.0 in | Wt 124.4 lb

## 2023-09-26 DIAGNOSIS — K219 Gastro-esophageal reflux disease without esophagitis: Secondary | ICD-10-CM

## 2023-09-26 DIAGNOSIS — R1310 Dysphagia, unspecified: Secondary | ICD-10-CM

## 2023-09-26 DIAGNOSIS — R634 Abnormal weight loss: Secondary | ICD-10-CM

## 2023-09-26 DIAGNOSIS — K5909 Other constipation: Secondary | ICD-10-CM

## 2023-09-26 DIAGNOSIS — R1319 Other dysphagia: Secondary | ICD-10-CM

## 2023-09-26 DIAGNOSIS — R630 Anorexia: Secondary | ICD-10-CM

## 2023-09-26 NOTE — Progress Notes (Unsigned)
Primary Care Physician:  Benita Stabile, MD Primary Gastroenterologist:  Dr. Jena Gauss   Pre-Procedure History & Physical: HPI:  Whitney Galloway is a 89 y.o. female here for follow-up GERD and constipation.  Peptic stricture dilated earlier this year.  Dysphagia resolved.  Reflux well-controlled on pantoprazole 40 mg twice daily.  Does not need famotidine.  Constipation continues to be a major issue; she companied by her daughter today.  This lady is extremely hard of hearing which makes communication more difficult.  May take 1-2 or even 3 Trulance's a day.  Recent stool recall reveals 3-4 bowel movements daily in the past week.  No bleeding.  Insidious weight loss noted.  Patient states she is not hungry these days. History of mild pancytopenia.  History of nausea and gastroparesis patient without such symptoms now.  She has lost about 27 pounds in the last year and a half.  Admitted the first of this year with toxic metabolic encephalopathy history of anorexia.  Megace prescribed but patient denies taking it.  Distant history of advanced colon adenoma removed back in the 90s very difficult examination; Went to [facility]  - was told she shoul never have another colonoscopy.    Past Medical History:  Diagnosis Date   Bowel incontinence    Colon polyp 1994 Malignant polyp   2005 NL BE; 2006 incomplete TCS DUE TO REDUNDNACY   Diverticulosis    Fibromyalgia    Gastritis    Gastroparesis 2009   65% RETENTION AT 2 HOURS   GERD (gastroesophageal reflux disease)    Hard of hearing    Hernia of unspecified site of abdominal cavity without mention of obstruction or gangrene    Hiatal hernia    HIATAL HERNIA 06/23/2008   Qualifier: Diagnosis of  By: Minna Merritts     HTN (hypertension)    IBS (irritable bowel syndrome)    Osteoporosis    S/P endoscopy 2007, 2009   2007: single distal esophageal erosion, s/p 56 F dilation, no web/ring/stricture noted; 2009: normal, Bravo  with adequate suppression on Prevacid BID   Splenic lesion HAMARTOMA   TB (tuberculosis) AGE 52   Wears dentures    top   Wears glasses    Wears hearing aid     Past Surgical History:  Procedure Laterality Date   BACK SURGERY  2014   lumbar lam   Benign tumor removed from bilateral breast     cataract surgery     COCHLEAR IMPLANT Left 09/21/2014   Procedure: COCHLEAR IMPLANT LEFT EAR;  Surgeon: Carolan Shiver, MD;  Location: [facility];  Service: ENT;  Laterality: Left;   COLONOSCOPY W/ POLYPECTOMY     ESOPHAGOGASTRODUODENOSCOPY  08/27/2011   hiatal hernia/mild gastritis   ESOPHAGOGASTRODUODENOSCOPY  05/25/2008    Normal esophagus without evidence of Barrett mass/  Normal stomach, duodenal bulb, and second portion of the duodenum   ESOPHAGOGASTRODUODENOSCOPY  08/11/2006   Single erosion at distal esophagus and a small sliding hiatal hernia/ No evidence of ring or stricture but esophagus was dilated by passing 56 [place] Maloney dilator/ Prepyloric antral erythema but no evidence of erosions or peptic ulcer disease.   ESOPHAGOGASTRODUODENOSCOPY  09/03/2005   Small sliding hiatal hernia without evidence of ring or  stricture formation. Esophagus dilated by passing 54-French Maloney dilator   given history of dysphagia   ESOPHAGOGASTRODUODENOSCOPY (EGD) WITH PROPOFOL N/A 07/30/2023   Procedure: ESOPHAGOGASTRODUODENOSCOPY (EGD) WITH PROPOFOL;  Surgeon: Corbin Ade, MD;  [facility];  Service: Endoscopy;  Laterality: N/A;  1215pm, asa 3   GIVENS CAPSULE STUDY  05/27/2008   Adequate acid suppression with proton pump inhibitor.   LEFT WRIST CYST REMOVAL     LUNG SURGERY for pulmonary nodules     MALONEY DILATION  07/30/2023   Procedure: MALONEY DILATION;  Surgeon: Corbin Ade, MD;  [facility]: [facility];  Service: Endoscopy;;   Mass removed from roof of mouth     POLYPECTOMY  07/30/2023   Procedure: POLYPECTOMY;  Surgeon: Corbin Ade, MD;  Location:  [facility];  Service: Endoscopy;;   SAVORY DILATION  08/27/2011   stricture in the distal esophagus   UPPER GASTROINTESTINAL ENDOSCOPY  2006   X-STOP IMPLANTATION      Prior to Admission medications   Medication Sig Start Date End Date Taking? Authorizing Provider  amlodipine-atorvastatin (CADUET) 10-20 MG tablet Take 1 tablet by mouth daily. 05/22/23  Yes [provider]  b complex vitamins capsule Take 1 capsule by mouth daily.   Yes [provider]  buPROPion (WELLBUTRIN XL) 150 MG 24 hr tablet Take 1 tablet (150 mg total) by mouth every morning. 10/28/22  Yes Sharee Holster, NP  Carboxymethylcellulose Sodium (THERATEARS OP) Apply to eye 2 (two) times daily.   Yes [provider]  LORazepam (ATIVAN) 1 MG tablet Take 1 tablet by mouth at bedtime. 12/17/12  Yes [provider]  metoprolol tartrate (LOPRESSOR) 25 MG tablet Take 1 tablet (25 mg total) by mouth 2 (two) times daily. 10/28/22  Yes Sharee Holster, NP  Multiple Vitamin (MULTIVITAMIN WITH MINERALS) TABS tablet Take 1 tablet by mouth daily.   Yes [provider]  Multiple Vitamins-Minerals (PRESERVISION AREDS PO) Take by mouth daily.   Yes [provider]  ondansetron (ZOFRAN-ODT) 4 MG disintegrating tablet Take 1 tablet (4 mg total) by mouth 3 (three) times daily as needed for nausea or vomiting. 09/25/23  Yes Maren Wiesen, Gerrit Friends, MD  pantoprazole (PROTONIX) 40 MG tablet Take 1 tablet (40 mg total) by mouth 2 (two) times daily before a meal. 06/24/23  Yes Lynwood Kubisiak, Gerrit Friends, MD  Plecanatide (TRULANCE) 3 MG TABS Take 1 tablet (3 mg total) by mouth daily. 08/21/23  Yes Herberth Deharo, Gerrit Friends, MD  Venlafaxine HCl 225 MG TB24 Take 1 tablet (225 mg total) by mouth daily. 10/28/22  Yes Sharee Holster, NP  famotidine (PEPCID) 20 MG tablet Take 20 mg by mouth daily. 10/29/22   [provider]    Allergies as of 09/26/2023   (No Known Allergies)    Family History  Problem Relation Age  of Onset   Colon cancer Neg Hx     Social History   Socioeconomic History   Marital status: Widowed    Spouse name: Not on file   Number of children: Not on file   Years of education: Not on file   Highest education level: Not on file  Occupational History   Not on file  Tobacco Use   Smoking status: Former    Current packs/day: 0.00    Types: Cigarettes    Quit date: 09/16/2004    Years since quitting: 19.0   Smokeless tobacco: Never   Tobacco comments:    Quit x 8 years  Vaping Use   Vaping status: Never Used  Substance and Sexual Activity   Alcohol use: No   Drug use: No   Sexual activity: Never  Other Topics Concern  Not on file  Social History Narrative   Not on file   Social Drivers of Health   Financial Resource Strain: Low Risk  (09/05/2020)   Overall Financial Resource Strain (CARDIA)    Difficulty of Paying Living Expenses: Not hard at all  Food Insecurity: No Food Insecurity (10/08/2022)   Hunger Vital Sign    Worried About Running Out of Food in the Last Year: Never true    Ran Out of Food in the Last Year: Never true  Transportation Needs: No Transportation Needs (10/08/2022)   PRAPARE - Administrator, Civil Service (Medical): No    Lack of Transportation (Non-Medical): No  Physical Activity: Inactive (09/05/2020)   Exercise Vital Sign    Days of Exercise per Week: 0 days    Minutes of Exercise per Session: 0 min  Stress: Stress Concern Present (09/05/2020)   [employer] of Occupational Health - Occupational Stress Questionnaire    Feeling of Stress : To some extent  Social Connections: Moderately Isolated (09/05/2020)   Social Connection and Isolation Panel [NHANES]    Frequency of Communication with Friends and Family: Three times a week    Frequency of Social Gatherings with Friends and Family: Three times a week    Attends Religious Services: Never    Active Member of Clubs or Organizations: Yes    Attends Tax inspector Meetings: Never    Marital Status: Widowed  Intimate Partner Violence: Not At Risk (10/08/2022)   Humiliation, Afraid, Rape, and Kick questionnaire    Fear of Current or Ex-Partner: No    Emotionally Abused: No    Physically Abused: No    Sexually Abused: No    Review of Systems: See HPI, otherwise negative ROS  Physical Exam: BP 114/69 (BP Location: Left Arm, Patient Position: Sitting)   Pulse 62   Temp (!) 97.2 F (36.2 C) (Oral)   Ht 5\' 8"  (1.727 m)   Wt 124 lb 6.4 oz (56.4 kg)   BMI 18.91 kg/m  General:   Pleasant elderly lady.  Extremely hard of hearing but mentally sharp. Abdomen: Non-distended, normal bowel sounds.  Soft and nontender without appreciable mass or hepatosplenomegaly.   Impression/Plan: 89 year old mentally sharp lady with profound hearing loss here for follow-up of GERD/dysphagia and constipation.  GERD well-controlled on pantoprazole twice daily.  Famotidine likely superfalousfulous.  Chronic constipation.  She takes Trulance excessively and not per instructions.  She does have multiple bowel movements when she escalates the dose.  She has lost double amount of weight in the last year and a half.  Nonspecific at this time.  She does admit to anorexia.  But no nausea or vomiting no abdominal pain.  Recommendations:  Lets continue Trulance: take one 3 mg tablet every day in the morning.  Never take more than 1 a day.  In addition to Trulance every day, take a capful of MiraLAX in 8 ounces of Sunny D or other beverage in the evening -every night.  If bowel movements not satisfactory in spite of the above routine, may take a second dose of MiraLAX around noon time on any given day needed.    Continue Protonix or pantoprazole 40 mg twice daily for acid reflux.  This medication should be taken before breakfast and supper  Stop famotidine.  Recent weight loss is noted. nonspecific.  Follow-up with doctor, Dr. Margo Aye next month as  scheduled.  Office visit with me in 3 months      Notice:  This dictation was prepared with Dragon dictation along with smaller phrase technology. Any transcriptional errors that result from this process are unintentional and may not be corrected upon review.

## 2023-09-26 NOTE — Patient Instructions (Signed)
It was nice to see you again today!  Lets continue Trulance: take one 3 mg tablet every day in the morning.  Never take more than 1 a day.  In addition to Trulance every day, take a capful of MiraLAX in 8 ounces of Sunny D or other beverage in the evening -every night.  If you still have some constipation in spite of this routine, may take a second dose of MiraLAX around noon time on any given day you may need it.  Continue Protonix or pantoprazole 40 mg twice daily for acid reflux.  This medication should be taken before breakfast and supper  Stop famotidine.  Recent weight loss is noted.  Is nonspecific.  Follow-up with your primary care doctor, Dr. Margo Aye next month.  Office visit with me in 3 months

## 2023-12-15 ENCOUNTER — Encounter: Payer: Self-pay | Admitting: Internal Medicine

## 2023-12-30 ENCOUNTER — Ambulatory Visit: Admitting: Internal Medicine

## 2023-12-30 ENCOUNTER — Encounter: Payer: Self-pay | Admitting: Internal Medicine

## 2023-12-30 VITALS — BP 118/76 | HR 62 | Temp 98.0°F | Ht 67.0 in | Wt 132.0 lb

## 2023-12-30 DIAGNOSIS — K5909 Other constipation: Secondary | ICD-10-CM

## 2023-12-30 DIAGNOSIS — K219 Gastro-esophageal reflux disease without esophagitis: Secondary | ICD-10-CM

## 2023-12-30 NOTE — Patient Instructions (Signed)
 It was good to see you again today!  Moving forward, take Trulance 3 mg once daily (no more than 1 tablet daily)  I would like you to take one half a capful of MiraLAX in 8 ounces of water or Sunny D every evening  You may take an extra half a cap of MiraLAX on occasion if you need it.  May continue taking pantoprazole or Protonix 40 mg twice daily before meals as for acid indigestion.  We may adjust this regimen in the future.  Back to see me in 3 months.

## 2023-12-30 NOTE — Progress Notes (Signed)
 Primary Care Physician:  Benita Stabile, MD Primary Gastroenterologist:  Dr. Margo Aye  Pre-Procedure History & Physical: HPI:  Whitney Galloway is a 89 y.o. female here for follow-up of GERD and constipation GERD well-controlled on pantoprazole 40 mg twice daily.  Some confusion on Trulance and GoLytely.  It sounds like she may have taken an extra Trulance on occasion since being seen here.  Overall, states her bowel function is better.  She does relate episodes of incontinence where she is afraid to go outside of the house.  She is accompanied by her daughter.  No nausea or vomiting.  She has gained 10 pounds since her last visit.  Past Medical History:  Diagnosis Date   Bowel incontinence    Colon polyp 1994 Malignant polyp   2005 NL BE; 2006 incomplete TCS DUE TO REDUNDNACY   Diverticulosis    Fibromyalgia    Gastritis    Gastroparesis 2009   65% RETENTION AT 2 HOURS   GERD (gastroesophageal reflux disease)    Hard of hearing    Hernia of unspecified site of abdominal cavity without mention of obstruction or gangrene    Hiatal hernia    HIATAL HERNIA 06/23/2008   Qualifier: Diagnosis of  By: Minna Merritts     HTN (hypertension)    IBS (irritable bowel syndrome)    Osteoporosis    S/P endoscopy 2007, 2009   2007: single distal esophageal erosion, s/p 56 F dilation, no web/ring/stricture noted; 2009: normal, Bravo with adequate suppression on Prevacid BID   Splenic lesion HAMARTOMA   TB (tuberculosis) AGE 54   Wears dentures    top   Wears glasses    Wears hearing aid     Past Surgical History:  Procedure Laterality Date   BACK SURGERY  2014   lumbar lam   Benign tumor removed from bilateral breast     cataract surgery     COCHLEAR IMPLANT Left 09/21/2014   Procedure: COCHLEAR IMPLANT LEFT EAR;  Surgeon: Carolan Shiver, MD;  Location: [facility];  Service: ENT;  Laterality: Left;   COLONOSCOPY W/ POLYPECTOMY     ESOPHAGOGASTRODUODENOSCOPY  08/27/2011    hiatal hernia/mild gastritis   ESOPHAGOGASTRODUODENOSCOPY  05/25/2008    Normal esophagus without evidence of Barrett mass/  Normal stomach, duodenal bulb, and second portion of the duodenum   ESOPHAGOGASTRODUODENOSCOPY  08/11/2006   Single erosion at distal esophagus and a small sliding hiatal hernia/ No evidence of ring or stricture but esophagus was dilated by passing 56 [place] Maloney dilator/ Prepyloric antral erythema but no evidence of erosions or peptic ulcer disease.   ESOPHAGOGASTRODUODENOSCOPY  09/03/2005   Small sliding hiatal hernia without evidence of ring or  stricture formation. Esophagus dilated by passing 54-French Maloney dilator   given history of dysphagia   ESOPHAGOGASTRODUODENOSCOPY (EGD) WITH PROPOFOL N/A 07/30/2023   Procedure: ESOPHAGOGASTRODUODENOSCOPY (EGD) WITH PROPOFOL;  Surgeon: Corbin Ade, MD;  Location: [facility];  Service: Endoscopy;  Laterality: N/A;  1215pm, asa 3   GIVENS CAPSULE STUDY  05/27/2008   Adequate acid suppression with proton pump inhibitor.   LEFT WRIST CYST REMOVAL     LUNG SURGERY for pulmonary nodules     MALONEY DILATION  07/30/2023   Procedure: MALONEY DILATION;  Surgeon: Corbin Ade, MD;  Location: [facility];  Service: Endoscopy;;   Mass removed from roof of mouth     POLYPECTOMY  07/30/2023   Procedure: POLYPECTOMY;  Surgeon: Eula Listen  M, MD;  Location: [facility];  Service: Endoscopy;;   SAVORY DILATION  08/27/2011   stricture in the distal esophagus   UPPER GASTROINTESTINAL ENDOSCOPY  2006   X-STOP IMPLANTATION      Prior to Admission medications   Medication Sig Start Date End Date Taking? Authorizing Provider  amlodipine-atorvastatin (CADUET) 10-20 MG tablet Take 1 tablet by mouth daily. 05/22/23  Yes [provider]  b complex vitamins capsule Take 1 capsule by mouth daily.   Yes [provider]  buPROPion (WELLBUTRIN XL) 150 MG 24 hr tablet Take 1 tablet (150 mg total) by mouth every  morning. 10/28/22  Yes Sharee Holster, NP  Carboxymethylcellulose Sodium (THERATEARS OP) Apply to eye 2 (two) times daily.   Yes [provider]  LORazepam (ATIVAN) 1 MG tablet Take 1 tablet by mouth at bedtime. 12/17/12  Yes [provider]  metoprolol tartrate (LOPRESSOR) 25 MG tablet Take 1 tablet (25 mg total) by mouth 2 (two) times daily. 10/28/22  Yes Sharee Holster, NP  Multiple Vitamin (MULTIVITAMIN WITH MINERALS) TABS tablet Take 1 tablet by mouth daily.   Yes [provider]  Multiple Vitamins-Minerals (PRESERVISION AREDS PO) Take by mouth daily.   Yes [provider]  ondansetron (ZOFRAN-ODT) 4 MG disintegrating tablet Take 1 tablet (4 mg total) by mouth 3 (three) times daily as needed for nausea or vomiting. 09/25/23  Yes Staley Budzinski, Gerrit Friends, MD  pantoprazole (PROTONIX) 40 MG tablet Take 1 tablet (40 mg total) by mouth 2 (two) times daily before a meal. 06/24/23  Yes Axyl Sitzman, Gerrit Friends, MD  Plecanatide (TRULANCE) 3 MG TABS Take 1 tablet (3 mg total) by mouth daily. 08/21/23  Yes Kameo Bains, Gerrit Friends, MD  Venlafaxine HCl 225 MG TB24 Take 1 tablet (225 mg total) by mouth daily. 10/28/22  Yes Sharee Holster, NP    Allergies as of 12/30/2023   (No Known Allergies)    Family History  Problem Relation Age of Onset   Colon cancer Neg Hx     Social History   Socioeconomic History   Marital status: Widowed    Spouse name: Not on file   Number of children: Not on file   Years of education: Not on file   Highest education level: Not on file  Occupational History   Not on file  Tobacco Use   Smoking status: Former    Current packs/day: 0.00    Types: Cigarettes    Quit date: 09/16/2004    Years since quitting: 19.2   Smokeless tobacco: Never   Tobacco comments:    Quit x 8 years  Vaping Use   Vaping status: Never Used  Substance and Sexual Activity   Alcohol use: No   Drug use: No   Sexual activity: Never  Other Topics Concern   Not on file   Social History Narrative   Not on file   Social Drivers of Health   Financial Resource Strain: Low Risk  (09/05/2020)   Overall Financial Resource Strain (CARDIA)    Difficulty of Paying Living Expenses: Not hard at all  Food Insecurity: No Food Insecurity (10/08/2022)   Hunger Vital Sign    Worried About Running Out of Food in the Last Year: Never true    Ran Out of Food in the Last Year: Never true  Transportation Needs: No Transportation Needs (10/08/2022)   PRAPARE - Administrator, Civil Service (Medical): No    Lack of Transportation (Non-Medical):  No  Physical Activity: Inactive (09/05/2020)   Exercise Vital Sign    Days of Exercise per Week: 0 days    Minutes of Exercise per Session: 0 min  Stress: Stress Concern Present (09/05/2020)   [employer] of Occupational Health - Occupational Stress Questionnaire    Feeling of Stress : To some extent  Social Connections: Moderately Isolated (09/05/2020)   Social Connection and Isolation Panel [NHANES]    Frequency of Communication with Friends and Family: Three times a week    Frequency of Social Gatherings with Friends and Family: Three times a week    Attends Religious Services: Never    Active Member of Clubs or Organizations: Yes    Attends Banker Meetings: Never    Marital Status: Widowed  Intimate Partner Violence: Not At Risk (10/08/2022)   Humiliation, Afraid, Rape, and Kick questionnaire    Fear of Current or Ex-Partner: No    Emotionally Abused: No    Physically Abused: No    Sexually Abused: No    Review of Systems: See HPI, otherwise negative ROS  Physical Exam: BP 118/76 (BP Location: Right Arm, Patient Position: Sitting, Cuff Size: Normal)   Pulse 62   Temp 98 F (36.7 C) (Oral)   Ht 5\' 7"  (1.702 m)   Wt 132 lb (59.9 kg)   SpO2 97%   BMI 20.67 kg/m  General:   Alert,  Well-developed, well-nourished, pleasant and cooperative in NAD  Impression/Plan: Pleasant opinionated  89 year old lady with chronic constipation and GERD.  GERD well-controlled.  Her bowel regimen needs to be fine-tuned further.  I did make a copy of last visits AVS and reviewed it with the patient and have created the pertinent one for todays visit.  Recommendations:  Moving forward, take Trulance 3 mg once daily (no more than 1 tablet daily)  take one half a capful of MiraLAX in 8 ounces of water or Sunny D every evening  May take an extra half a cap of MiraLAX on occasion if needed.  May continue taking pantoprazole or Protonix 40 mg twice daily before meals.  Regimen subject to further adjustment in the future.  Back to see me in 3 months.     Notice: This dictation was prepared with Dragon dictation along with smaller phrase technology. Any transcriptional errors that result from this process are unintentional and may not be corrected upon review.

## 2024-03-04 ENCOUNTER — Encounter: Payer: Self-pay | Admitting: Internal Medicine

## 2024-04-12 ENCOUNTER — Ambulatory Visit: Admitting: Internal Medicine

## 2024-04-20 ENCOUNTER — Ambulatory Visit: Admitting: Internal Medicine

## 2024-04-27 ENCOUNTER — Ambulatory Visit (INDEPENDENT_AMBULATORY_CARE_PROVIDER_SITE_OTHER): Admitting: Internal Medicine

## 2024-04-27 ENCOUNTER — Encounter: Payer: Self-pay | Admitting: Internal Medicine

## 2024-04-27 VITALS — BP 115/58 | HR 63 | Temp 97.8°F | Ht 68.0 in | Wt 137.8 lb

## 2024-04-27 DIAGNOSIS — K219 Gastro-esophageal reflux disease without esophagitis: Secondary | ICD-10-CM | POA: Diagnosis not present

## 2024-04-27 DIAGNOSIS — K5909 Other constipation: Secondary | ICD-10-CM

## 2024-04-27 NOTE — Progress Notes (Signed)
 Primary Care Physician:  Shona Norleen PEDLAR, MD Primary Gastroenterologist:  Dr. Shaaron  Pre-Procedure History & Physical: HPI:  Whitney Galloway is a 89 y.o. female here for follow-up of constipation.  She has been on various regimens.  When she was last here we started Trulance  3 mg daily with MiraLAX  one half a cap to 1 capful nightly this achieve a bowel movement least once daily to every other day.  She returns a stating that this has worked very well for her 1 or 2 times she held Trulance  for couple days when she felt she went too much.  But all in all, she is having good bowel function daily to every other day is quite pleased.  GERD well-controlled on pantoprazole .  Requires twice daily therapy for good control  Past Medical History:  Diagnosis Date   Bowel incontinence    Colon polyp 1994 Malignant polyp   2005 NL BE; 2006 incomplete TCS DUE TO REDUNDNACY   Diverticulosis    Fibromyalgia    Gastritis    Gastroparesis 2009   65% RETENTION AT 2 HOURS   GERD (gastroesophageal reflux disease)    Hard of hearing    Hernia of unspecified site of abdominal cavity without mention of obstruction or gangrene    Hiatal hernia    HIATAL HERNIA 06/23/2008   Qualifier: Diagnosis of  By: Georgina Palma     HTN (hypertension)    IBS (irritable bowel syndrome)    Osteoporosis    S/P endoscopy 2007, 2009   2007: single distal esophageal erosion, s/p 56 F dilation, no web/ring/stricture noted; 2009: normal, Bravo with adequate suppression on Prevacid BID   Splenic lesion HAMARTOMA   TB (tuberculosis) AGE 37   Wears dentures    top   Wears glasses    Wears hearing aid     Past Surgical History:  Procedure Laterality Date   BACK SURGERY  2014   lumbar lam   Benign tumor removed from bilateral breast     cataract surgery     COCHLEAR IMPLANT Left 09/21/2014   Procedure: COCHLEAR IMPLANT LEFT EAR;  Surgeon: Camellia CHRISTELLA Milliner, MD;  Location: [facility];  Service: ENT;  Laterality:  Left;   COLONOSCOPY W/ POLYPECTOMY     ESOPHAGOGASTRODUODENOSCOPY  08/27/2011   hiatal hernia/mild gastritis   ESOPHAGOGASTRODUODENOSCOPY  05/25/2008    Normal esophagus without evidence of Barrett mass/  Normal stomach, duodenal bulb, and second portion of the duodenum   ESOPHAGOGASTRODUODENOSCOPY  08/11/2006   Single erosion at distal esophagus and a small sliding hiatal hernia/ No evidence of ring or stricture but esophagus was dilated by passing 56 [place] Maloney dilator/ Prepyloric antral erythema but no evidence of erosions or peptic ulcer disease.   ESOPHAGOGASTRODUODENOSCOPY  09/03/2005   Small sliding hiatal hernia without evidence of ring or  stricture formation. Esophagus dilated by passing 54-French Maloney dilator   given history of dysphagia   ESOPHAGOGASTRODUODENOSCOPY (EGD) WITH PROPOFOL  N/A 07/30/2023   Procedure: ESOPHAGOGASTRODUODENOSCOPY (EGD) WITH PROPOFOL ;  Surgeon: Shaaron Lamar CHRISTELLA, MD;  Location: [facility];  Service: Endoscopy;  Laterality: N/A;  1215pm, asa 3   GIVENS CAPSULE STUDY  05/27/2008   Adequate acid suppression with proton pump inhibitor.   LEFT WRIST CYST REMOVAL     LUNG SURGERY for pulmonary nodules     MALONEY DILATION  07/30/2023   Procedure: MALONEY DILATION;  Surgeon: Shaaron Lamar CHRISTELLA, MD;  Location: [facility];  Service: Endoscopy;;  Mass removed from roof of mouth     POLYPECTOMY  07/30/2023   Procedure: POLYPECTOMY;  Surgeon: Shaaron Lamar HERO, MD;  Location: [facility];  Service: Endoscopy;;   SAVORY DILATION  08/27/2011   stricture in the distal esophagus   UPPER GASTROINTESTINAL ENDOSCOPY  2006   X-STOP IMPLANTATION      Prior to Admission medications   Medication Sig Start Date End Date Taking? Authorizing Provider  amlodipine -atorvastatin  (CADUET ) 10-20 MG tablet Take 1 tablet by mouth daily. 05/22/23  Yes [provider]  b complex vitamins capsule Take 1 capsule by mouth daily.   Yes [provider]  buPROPion   (WELLBUTRIN  XL) 150 MG 24 hr tablet Take 1 tablet (150 mg total) by mouth every morning. 10/28/22  Yes Landy Barnie RAMAN, NP  Carboxymethylcellulose Sodium (THERATEARS OP) Apply to eye 2 (two) times daily.   Yes [provider]  cetirizine (ZYRTEC) 10 MG tablet Take 10 mg by mouth daily as needed.   Yes [provider]  famotidine  (PEPCID ) 40 MG tablet Take 40 mg by mouth daily as needed.   Yes [provider]  LORazepam  (ATIVAN ) 1 MG tablet Take 1 tablet by mouth at bedtime. 12/17/12  Yes [provider]  metoprolol  tartrate (LOPRESSOR ) 25 MG tablet Take 1 tablet (25 mg total) by mouth 2 (two) times daily. 10/28/22  Yes Landy Barnie RAMAN, NP  Multiple Vitamin (MULTIVITAMIN WITH MINERALS) TABS tablet Take 1 tablet by mouth daily.   Yes [provider]  Multiple Vitamins-Minerals (PRESERVISION AREDS PO) Take by mouth daily.   Yes [provider]  pantoprazole  (PROTONIX ) 40 MG tablet Take 1 tablet (40 mg total) by mouth 2 (two) times daily before a meal. 06/24/23  Yes Pistol Kessenich, Lamar HERO, MD  Plecanatide  (TRULANCE ) 3 MG TABS Take 1 tablet (3 mg total) by mouth daily. 08/21/23  Yes Kent Braunschweig, Lamar HERO, MD  triamcinolone  cream (KENALOG ) 0.1 % Apply 1 Application topically 2 (two) times daily. 02/09/24  Yes [provider]  Venlafaxine  HCl 225 MG TB24 Take 1 tablet (225 mg total) by mouth daily. 10/28/22  Yes Landy Barnie RAMAN, NP    Allergies as of 04/27/2024   (No Known Allergies)    Family History  Problem Relation Age of Onset   Colon cancer Neg Hx     Social History   Socioeconomic History   Marital status: Widowed    Spouse name: Not on file   Number of children: Not on file   Years of education: Not on file   Highest education level: Not on file  Occupational History   Not on file  Tobacco Use   Smoking status: Former    Current packs/day: 0.00    Types: Cigarettes    Quit date: 09/16/2004    Years since quitting: 19.6   Smokeless  tobacco: Never   Tobacco comments:    Quit x 8 years  Vaping Use   Vaping status: Never Used  Substance and Sexual Activity   Alcohol  use: No   Drug use: No   Sexual activity: Never  Other Topics Concern   Not on file  Social History Narrative   Not on file   Social Drivers of Health   Financial Resource Strain: Low Risk  (09/05/2020)   Overall Financial Resource Strain (CARDIA)    Difficulty of Paying Living Expenses: Not hard at all  Food Insecurity: No Food Insecurity (10/08/2022)   Hunger Vital Sign    Worried About Running  Out of Food in the Last Year: Never true    Ran Out of Food in the Last Year: Never true  Transportation Needs: No Transportation Needs (10/08/2022)   PRAPARE - Administrator, Civil Service (Medical): No    Lack of Transportation (Non-Medical): No  Physical Activity: Inactive (09/05/2020)   Exercise Vital Sign    Days of Exercise per Week: 0 days    Minutes of Exercise per Session: 0 min  Stress: Stress Concern Present (09/05/2020)   [employer] of Occupational Health - Occupational Stress Questionnaire    Feeling of Stress : To some extent  Social Connections: Moderately Isolated (09/05/2020)   Social Connection and Isolation Panel    Frequency of Communication with Friends and Family: Three times a week    Frequency of Social Gatherings with Friends and Family: Three times a week    Attends Religious Services: Never    Active Member of Clubs or Organizations: Yes    Attends Banker Meetings: Never    Marital Status: Widowed  Intimate Partner Violence: Not At Risk (10/08/2022)   Humiliation, Afraid, Rape, and Kick questionnaire    Fear of Current or Ex-Partner: No    Emotionally Abused: No    Physically Abused: No    Sexually Abused: No    Review of Systems: See HPI, otherwise negative ROS  Physical Exam: BP (!) 115/58 (BP Location: Left Arm, Patient Position: Sitting, Cuff Size: Normal)   Pulse 63   Temp 97.8  F (36.6 C) (Oral)   Ht 5' 8 (1.727 m)   Wt 137 lb 12.8 oz (62.5 kg)   SpO2 96%   BMI 20.95 kg/m  General:   Alert, somewhat frail but spry, pleasant and cooperative in NAD extremely hard of hearing at baseline. Lungs:  Clear throughout to auscultation.   No wheezes, crackles, or rhonchi. No acute distress. Heart:  Regular rate and rhythm; no murmurs, clicks, rubs,  or gallops. Abdomen: Non-distended, normal bowel sounds.  Soft and nontender without appreciable mass or hepatosplenomegaly.   Impression/Plan: 89 year old lady extremely hard of hearing with chronic constipation.  Been a challenge to treat.  More recently Trulance  3 mg daily and one half a capful to 1 capful of MiraLAX  has done a good job at providing a bowel movement daily to every other day.  She is quite pleased as I am.  GERD well-controlled.  Requires pantoprazole  40 mg twice daily.  No alarm symptoms.  Recommendations:  As discussed, continue Trulance  3 mg daily.  May hold the medication temporarily if  diarrhea.    Likewise, continue MiraLAX  one half a capful to 1 capful daily to maintain good bowel function  Continue pantoprazole  40 mg daily to control reflux.  Medication best taken before breakfast and supper.  Unless something comes up, plan to see back in 1 year.    Look Notice: This dictation was prepared with Dragon dictation along with smaller phrase technology. Any transcriptional errors that result from this process are unintentional and may not be corrected upon review.

## 2024-04-27 NOTE — Patient Instructions (Addendum)
 Good to see you again today!  Glad to hear your bowels have pretty much straightened out.

## 2024-04-28 ENCOUNTER — Other Ambulatory Visit: Payer: Self-pay | Admitting: Internal Medicine
